# Patient Record
Sex: Male | Born: 1955
Health system: Southern US, Community
[De-identification: ages and names within clinical notes are randomized; demographics above are authoritative.]

## PROBLEM LIST (undated history)

## (undated) DIAGNOSIS — R1310 Dysphagia, unspecified: Secondary | ICD-10-CM

## (undated) DIAGNOSIS — I82409 Acute embolism and thrombosis of unspecified deep veins of unspecified lower extremity: Secondary | ICD-10-CM

## (undated) DIAGNOSIS — F419 Anxiety disorder, unspecified: Secondary | ICD-10-CM

## (undated) DIAGNOSIS — G7000 Myasthenia gravis without (acute) exacerbation: Secondary | ICD-10-CM

## (undated) DIAGNOSIS — E7800 Pure hypercholesterolemia, unspecified: Secondary | ICD-10-CM

## (undated) DIAGNOSIS — I10 Essential (primary) hypertension: Secondary | ICD-10-CM

## (undated) DIAGNOSIS — S42309A Unspecified fracture of shaft of humerus, unspecified arm, initial encounter for closed fracture: Secondary | ICD-10-CM

## (undated) DIAGNOSIS — S2239XA Fracture of one rib, unspecified side, initial encounter for closed fracture: Secondary | ICD-10-CM

## (undated) HISTORY — DX: Unspecified fracture of shaft of humerus, unspecified arm, initial encounter for closed fracture: S42.309A

## (undated) HISTORY — PX: TRACHEOSTOMY: SUR1362

## (undated) HISTORY — DX: Fracture of one rib, unspecified side, initial encounter for closed fracture: S22.39XA

---

## 1999-10-11 ENCOUNTER — Emergency Department (HOSPITAL_COMMUNITY): Admission: EM | Admit: 1999-10-11 | Discharge: 1999-10-11 | Payer: Self-pay | Admitting: Emergency Medicine

## 2000-03-24 ENCOUNTER — Emergency Department (HOSPITAL_COMMUNITY): Admission: EM | Admit: 2000-03-24 | Discharge: 2000-03-24 | Payer: Self-pay | Admitting: Emergency Medicine

## 2001-01-04 ENCOUNTER — Emergency Department (HOSPITAL_COMMUNITY): Admission: EM | Admit: 2001-01-04 | Discharge: 2001-01-05 | Payer: Self-pay | Admitting: Emergency Medicine

## 2006-05-13 ENCOUNTER — Ambulatory Visit: Payer: Self-pay | Admitting: Family Medicine

## 2006-05-13 ENCOUNTER — Inpatient Hospital Stay (HOSPITAL_COMMUNITY): Admission: RE | Admit: 2006-05-13 | Discharge: 2006-05-15 | Payer: Self-pay | Admitting: Family Medicine

## 2006-05-16 ENCOUNTER — Ambulatory Visit: Payer: Self-pay | Admitting: Sports Medicine

## 2006-05-17 ENCOUNTER — Ambulatory Visit: Payer: Self-pay | Admitting: Family Medicine

## 2006-05-20 ENCOUNTER — Ambulatory Visit: Payer: Self-pay | Admitting: Family Medicine

## 2006-05-23 ENCOUNTER — Ambulatory Visit: Payer: Self-pay | Admitting: Sports Medicine

## 2006-05-24 ENCOUNTER — Ambulatory Visit: Payer: Self-pay | Admitting: Sports Medicine

## 2006-05-27 ENCOUNTER — Ambulatory Visit: Payer: Self-pay | Admitting: Family Medicine

## 2006-05-29 ENCOUNTER — Ambulatory Visit: Payer: Self-pay | Admitting: Family Medicine

## 2006-06-07 ENCOUNTER — Ambulatory Visit: Payer: Self-pay | Admitting: Family Medicine

## 2006-06-14 ENCOUNTER — Ambulatory Visit: Payer: Self-pay | Admitting: Family Medicine

## 2006-06-28 ENCOUNTER — Ambulatory Visit: Payer: Self-pay | Admitting: Family Medicine

## 2006-07-08 ENCOUNTER — Ambulatory Visit: Payer: Self-pay | Admitting: Family Medicine

## 2006-07-12 ENCOUNTER — Ambulatory Visit: Payer: Self-pay | Admitting: Family Medicine

## 2006-07-22 ENCOUNTER — Ambulatory Visit: Payer: Self-pay | Admitting: Family Medicine

## 2006-08-06 ENCOUNTER — Ambulatory Visit: Payer: Self-pay | Admitting: Family Medicine

## 2006-08-08 DIAGNOSIS — I10 Essential (primary) hypertension: Secondary | ICD-10-CM | POA: Insufficient documentation

## 2006-08-08 DIAGNOSIS — E7800 Pure hypercholesterolemia, unspecified: Secondary | ICD-10-CM | POA: Insufficient documentation

## 2006-08-19 ENCOUNTER — Ambulatory Visit: Payer: Self-pay | Admitting: Family Medicine

## 2006-09-02 ENCOUNTER — Ambulatory Visit: Payer: Self-pay | Admitting: Family Medicine

## 2006-09-02 LAB — CONVERTED CEMR LAB: INR: 1.9

## 2006-09-16 ENCOUNTER — Ambulatory Visit: Payer: Self-pay | Admitting: Family Medicine

## 2006-09-27 ENCOUNTER — Telehealth: Payer: Self-pay | Admitting: *Deleted

## 2006-09-30 ENCOUNTER — Ambulatory Visit: Payer: Self-pay | Admitting: Family Medicine

## 2006-10-21 ENCOUNTER — Ambulatory Visit: Payer: Self-pay | Admitting: Family Medicine

## 2006-11-18 ENCOUNTER — Ambulatory Visit: Payer: Self-pay | Admitting: Sports Medicine

## 2006-11-18 ENCOUNTER — Encounter (INDEPENDENT_AMBULATORY_CARE_PROVIDER_SITE_OTHER): Payer: Self-pay | Admitting: Family Medicine

## 2006-11-18 LAB — CONVERTED CEMR LAB: INR: 2.6

## 2006-12-10 ENCOUNTER — Telehealth: Payer: Self-pay | Admitting: *Deleted

## 2006-12-16 ENCOUNTER — Ambulatory Visit: Payer: Self-pay | Admitting: Sports Medicine

## 2007-01-13 ENCOUNTER — Ambulatory Visit: Payer: Self-pay | Admitting: Sports Medicine

## 2007-02-11 ENCOUNTER — Ambulatory Visit: Payer: Self-pay | Admitting: Family Medicine

## 2007-02-11 LAB — CONVERTED CEMR LAB: INR: 3

## 2007-02-19 ENCOUNTER — Ambulatory Visit: Payer: Self-pay | Admitting: Family Medicine

## 2007-03-18 ENCOUNTER — Ambulatory Visit: Payer: Self-pay | Admitting: Family Medicine

## 2007-03-18 LAB — CONVERTED CEMR LAB: INR: 2.6

## 2007-04-21 ENCOUNTER — Ambulatory Visit: Payer: Self-pay | Admitting: Family Medicine

## 2007-04-21 DIAGNOSIS — Z8672 Personal history of thrombophlebitis: Secondary | ICD-10-CM | POA: Insufficient documentation

## 2007-04-21 LAB — CONVERTED CEMR LAB: INR: 3.2

## 2007-05-19 ENCOUNTER — Ambulatory Visit: Payer: Self-pay | Admitting: Family Medicine

## 2007-06-16 ENCOUNTER — Ambulatory Visit: Payer: Self-pay | Admitting: *Deleted

## 2007-06-16 LAB — CONVERTED CEMR LAB: INR: 3.3

## 2007-06-30 ENCOUNTER — Ambulatory Visit: Payer: Self-pay | Admitting: Family Medicine

## 2007-06-30 LAB — CONVERTED CEMR LAB: INR: 3.6

## 2007-07-14 ENCOUNTER — Ambulatory Visit: Payer: Self-pay | Admitting: Family Medicine

## 2007-08-08 ENCOUNTER — Ambulatory Visit: Payer: Self-pay | Admitting: Family Medicine

## 2007-09-05 ENCOUNTER — Ambulatory Visit: Payer: Self-pay | Admitting: Family Medicine

## 2007-09-05 LAB — CONVERTED CEMR LAB: INR: 3.4

## 2007-10-03 ENCOUNTER — Ambulatory Visit: Payer: Self-pay | Admitting: Family Medicine

## 2007-10-17 ENCOUNTER — Encounter (INDEPENDENT_AMBULATORY_CARE_PROVIDER_SITE_OTHER): Payer: Self-pay | Admitting: Family Medicine

## 2007-11-06 ENCOUNTER — Encounter (INDEPENDENT_AMBULATORY_CARE_PROVIDER_SITE_OTHER): Payer: Self-pay | Admitting: Family Medicine

## 2007-11-06 ENCOUNTER — Ambulatory Visit: Payer: Self-pay | Admitting: Family Medicine

## 2007-11-06 LAB — CONVERTED CEMR LAB
ALT: 24 units/L (ref 0–53)
AST: 20 units/L (ref 0–37)
Albumin: 4.6 g/dL (ref 3.5–5.2)
BUN: 15 mg/dL (ref 6–23)
CO2: 24 meq/L (ref 19–32)
Calcium: 9.4 mg/dL (ref 8.4–10.5)
Chloride: 102 meq/L (ref 96–112)
Cholesterol: 222 mg/dL — ABNORMAL HIGH (ref 0–200)
HDL: 35 mg/dL — ABNORMAL LOW (ref >39)
Potassium: 4.3 meq/L (ref 3.5–5.3)

## 2007-11-13 ENCOUNTER — Encounter (INDEPENDENT_AMBULATORY_CARE_PROVIDER_SITE_OTHER): Payer: Self-pay | Admitting: Family Medicine

## 2007-12-03 ENCOUNTER — Encounter (INDEPENDENT_AMBULATORY_CARE_PROVIDER_SITE_OTHER): Payer: Self-pay | Admitting: *Deleted

## 2008-03-15 ENCOUNTER — Encounter (INDEPENDENT_AMBULATORY_CARE_PROVIDER_SITE_OTHER): Payer: Self-pay | Admitting: Family Medicine

## 2009-01-10 ENCOUNTER — Encounter: Payer: Self-pay | Admitting: Family Medicine

## 2009-03-31 ENCOUNTER — Encounter: Payer: Self-pay | Admitting: Family Medicine

## 2009-05-30 ENCOUNTER — Telehealth: Payer: Self-pay | Admitting: Family Medicine

## 2009-06-27 ENCOUNTER — Ambulatory Visit: Payer: Self-pay | Admitting: Family Medicine

## 2009-06-27 ENCOUNTER — Encounter: Payer: Self-pay | Admitting: Family Medicine

## 2009-06-28 LAB — CONVERTED CEMR LAB
Albumin: 4.7 g/dL (ref 3.5–5.2)
CO2: 25 meq/L (ref 19–32)
Cholesterol: 223 mg/dL — ABNORMAL HIGH (ref 0–200)
Glucose, Bld: 94 mg/dL (ref 70–99)
Potassium: 4 meq/L (ref 3.5–5.3)
Sodium: 134 meq/L — ABNORMAL LOW (ref 135–145)
Total Protein: 7.3 g/dL (ref 6.0–8.3)
Triglycerides: 164 mg/dL — ABNORMAL HIGH (ref <150)

## 2009-07-28 ENCOUNTER — Ambulatory Visit: Payer: Self-pay | Admitting: Family Medicine

## 2009-07-29 ENCOUNTER — Encounter: Payer: Self-pay | Admitting: Family Medicine

## 2009-08-11 ENCOUNTER — Telehealth: Payer: Self-pay | Admitting: Family Medicine

## 2009-08-26 ENCOUNTER — Telehealth: Payer: Self-pay | Admitting: Family Medicine

## 2009-11-24 ENCOUNTER — Ambulatory Visit: Payer: Self-pay | Admitting: Family Medicine

## 2009-11-24 ENCOUNTER — Encounter: Payer: Self-pay | Admitting: Family Medicine

## 2009-11-24 LAB — CONVERTED CEMR LAB
BUN: 23 mg/dL (ref 6–23)
CO2: 26 meq/L (ref 19–32)
Calcium: 9.2 mg/dL (ref 8.4–10.5)
Chloride: 99 meq/L (ref 96–112)
Creatinine, Ser: 1.04 mg/dL (ref 0.40–1.50)
Glucose, Bld: 90 mg/dL (ref 70–99)

## 2009-11-28 ENCOUNTER — Encounter: Payer: Self-pay | Admitting: Family Medicine

## 2010-07-11 NOTE — Letter (Signed)
Summary: Generic Letter  [facility]  [address]   [place], [place] [address]   Phone: [phone]  Fax: [phone]    07/29/2009  AMIRE LEAZER [address] [place], [place]  [address]  Dear Mr. Sarin,  Please call me at the office when you have a few minutes to discuss your blood pressure and cholesterol.  I tried to call you today, but was unable to get through at your home number.  I look forward to talking with you.        Sincerely,   Asher Muir MD  Appended Document: Generic Letter mailed.

## 2010-07-11 NOTE — Progress Notes (Signed)
Summary: refill & message  Phone Note Refill Request Call back at Home Phone [phone] Message from:  Patient  Refills Requested: Medication #1:  LISINOPRIL-HYDROCHLOROTHIAZIDE 20-25 MG TABS Take 1 tablet by mouth once a day pt would like to talk to doctor  Initial call taken by: De Nurse,  August 11, 2009 3:51 PM Caller: Patient Initial call taken by: De Nurse,  August 11, 2009 3:50 PM  Follow-up for Phone Call        left mssg for pt to call back.  refilled bp med for one month, but would like to discuss making changes in the dose. Follow-up by: Asher Muir MD,  August 12, 2009 12:14 PM  Additional Follow-up for Phone Call Additional follow up Details #1::        left another message for pt to call back to discuss bp meds and cholesterol Additional Follow-up by: Asher Muir MD,  August 19, 2009 12:12 PM    Prescriptions: LISINOPRIL-HYDROCHLOROTHIAZIDE 20-25 MG TABS (LISINOPRIL-HYDROCHLOROTHIAZIDE) Take 1 tablet by mouth once a day  #34 x 0   Entered and Authorized by:   Asher Muir MD   Signed by:   Asher Muir MD on 08/12/2009   Method used:   Electronically to        [facility] [address] (retail)       [phone]       [place], [place]  [address]       Ph: [phone]       Fax: [phone]   RxID:   [id number]

## 2010-07-11 NOTE — Progress Notes (Signed)
  Phone Note Call from Patient   Caller: Patient Summary of Call: pt returned call.  discussed lab results.  ldl above goal; would like to start simva 40.  Also, would like to increase lisinopril hctz.  will call [facility] [address].  pt to come back for labs (CMET) and bp check in 3 months Initial call taken by: Asher Muir MD,  August 26, 2009 12:10 PM    New/Updated Medications: LISINOPRIL-HYDROCHLOROTHIAZIDE 20-12.5 MG TABS (LISINOPRIL-HYDROCHLOROTHIAZIDE) 2 tabs by mouth daily for blood pressure SIMVASTATIN 40 MG TABS (SIMVASTATIN) 1 tab by mouth daily for cholesterol Prescriptions: SIMVASTATIN 40 MG TABS (SIMVASTATIN) 1 tab by mouth daily for cholesterol  #90 x 3   Entered and Authorized by:   Asher Muir MD   Signed by:   Asher Muir MD on 08/26/2009   Method used:   Electronically to        [facility] [address] (retail)       [phone]       [place], [place]  [address]       Ph: [phone]       Fax: [phone]   RxID:   [id number] LISINOPRIL-HYDROCHLOROTHIAZIDE 20-12.5 MG TABS (LISINOPRIL-HYDROCHLOROTHIAZIDE) 2 tabs by mouth daily for blood pressure  #180 x 3   Entered and Authorized by:   Asher Muir MD   Signed by:   Asher Muir MD on 08/26/2009   Method used:   Electronically to        [facility] [address] (retail)       [phone]       [place], [place]  [address]       Ph: [phone]       Fax: [phone]   RxID:   [id number]

## 2010-07-11 NOTE — Assessment & Plan Note (Signed)
Summary: bp check/kh  Nurse Visit BP checked manually after patient rested 8 minutes. BP LA 130/86 , RA  136/88  pulse 92. taking meds as directed. will forward to Dr. Lafonda Mosses and call patient with her comments. [phone]. Theresia Lo RN  July 28, 2009 3:48 PM  Attempted to call pt at number above and received mssg stating call could not be completed as dialed.  Will send letter. Allergies: No Known Drug Allergies  Orders Added: 1)  No Charge Patient Arrived (NCPA0) [NCPA0]

## 2010-07-11 NOTE — Assessment & Plan Note (Signed)
Summary: re-establish,df   Vital Signs:  Patient profile:   55 year old male Height:      74 inches Weight:      242.7 pounds BMI:     31.27 Temp:     97.5 degrees F oral Pulse rate:   73 / minute BP sitting:   140 / 94  (right arm) Cuff size:   large  Vitals Entered By: Gladstone Pih (June 27, 2009 4:27 PM) CC: htn, hld, stress, hx dvt Is Patient Diabetic? No Pain Assessment Patient in pain? no        CC:  htn, hld, stress, and hx dvt.  History of Present Illness: Here for f/u.  has not been here in > year.  discussed:  1.  htn--slightly over goal today even on manual recheck.  on lisinopril-hctz 20-25.  does not check bps at home  2.  hld--not on meds.  ldl goal would be 130.    3.  personal stress--see social hx  4.  dvt--in 2007.  no smoking, but did travel prior to the dvt.  off coumadin now.  takes asprin  Habits & Providers  Alcohol-Tobacco-Diet     Tobacco Status: never  Current Medications (verified): 1)  Lisinopril-Hydrochlorothiazide 20-25 Mg Tabs (Lisinopril-Hydrochlorothiazide) .... Take 1 Tablet By Mouth Once A Day  Allergies: No Known Drug Allergies  Past History:  Past Medical History: no h/o blood clot prior to R LE DVT Dec 2007 HTN hld  Family History: Reviewed history from 08/08/2006 and no changes required. no family h/o blood clots, miscarriages, sudden death, or clotting disorders  Social History: Reviewed history from 11/06/2007 and no changes required. Married to [place]. Employed at [employer]  as Production designer, theatre/television/film. Exercises approx 3-5 times per week. Never smoked.  occasional EtOH, no drugs.  lots of personal stress right now.  wife has cheated on him and is gambling.  for financial reasons he is needing to stay in the marriage for the time being.  has not yet gone to counseling  Physical Exam  General:  Well-developed,well-nourished,in no acute distress; alert,appropriate and cooperative throughout examination Lungs:  Normal  respiratory effort, chest expands symmetrically. Lungs are clear to auscultation, no crackles or wheezes. Heart:  Normal rate and regular rhythm. S1 and S2 normal without gallop, murmur, click, rub or other extra sounds. Additional Exam:  vital signs reviewed    Impression & Recommendations:  Problem # 1:  HYPERTENSION, BENIGN SYSTEMIC (ICD-401.1) Assessment Deteriorated slightly above goal.  decided he will return for recheck at nurse visit and we will increase if still above goal.  check bmet His updated medication list for this problem includes:    Lisinopril-hydrochlorothiazide 20-25 Mg Tabs (Lisinopril-hydrochlorothiazide) .Marland Kitchen... Take 1 tablet by mouth once a day  Orders: Comp Met-[facility] ([id number]) [facility]- Est  Level 4 (81856)  Problem # 2:  HYPERCHOLESTEROLEMIA (ICD-272.0) Assessment: Unchanged check flp today.  goal is 130 Orders: Lipid-[facility] (31497-02637) [facility]- Est  Level 4 ([address]885)  Problem # 3:  DEEP VENOUS THROMBOPHLEBITIS, HX OF (ICD-V12.52) Assessment: Improved  no signs of recurrence His updated medication list for this problem includes:    Aspirin 81 Mg Chew (Aspirin) .Marland Kitchen... 1 tab by mouth daily  His updated medication list for this problem includes:    Aspirin 81 Mg Chew (Aspirin) .Marland Kitchen... 1 tab by mouth daily  Orders: [facility]- Est  Level 4 (02774)  Problem # 4:  prevention will set up colonoscopy he will think about psa and prostate exam  Complete Medication  List: 1)  Lisinopril-hydrochlorothiazide 20-25 Mg Tabs (Lisinopril-hydrochlorothiazide) .... Take 1 tablet by mouth once a day 2)  Folic Acid 400 Mcg Tabs (Folic acid) .Marland Kitchen.. 1 tab by mouth daily 3)  Aspirin 81 Mg Chew (Aspirin) .Marland Kitchen.. 1 tab by mouth daily  Other Orders: Colonoscopy (Colon)  Patient Instructions: 1)  It was nice to see you today. 2)  I will call you with your lab results. 3)  For your blood pressure, make a nurse visit appointment to check blood pressure. 4)  Then I will call you and we will talk  about what to do with your medicines. 5)  We will help you set up a colonoscopy. 6)  Please schedule a follow-up appointment in 6 months  for blood pressure.  Prescriptions: LISINOPRIL-HYDROCHLOROTHIAZIDE 20-25 MG TABS (LISINOPRIL-HYDROCHLOROTHIAZIDE) Take 1 tablet by mouth once a day  #34 x 0   Entered and Authorized by:   Asher Muir MD   Signed by:   Asher Muir MD on 06/27/2009   Method used:   Electronically to        [facility] [address] (retail)       [phone]       [place], [place]  [address]       Ph: [phone]       Fax: [phone]   RxID:   [id number]   Prevention & Chronic Care Immunizations   Influenza vaccine: Not documented    Tetanus booster: Not documented    Pneumococcal vaccine: Not documented  Colorectal Screening   Hemoccult: Not documented   Hemoccult due: Not Indicated    Colonoscopy: Not documented   Colonoscopy action/deferral: GI Referral  (06/27/2009)  Other Screening   PSA: Not documented   Smoking status: never  (06/27/2009)  Lipids   Total Cholesterol: 222  (11/06/2007)   LDL: 152  (11/06/2007)   LDL Direct: Not documented   HDL: 35  (11/06/2007)   Triglycerides: 177  (11/06/2007)    SGOT (AST): 20  (11/06/2007)   SGPT (ALT): 24  (11/06/2007) CMP ordered    Alkaline phosphatase: 45  (11/06/2007)   Total bilirubin: 1.1  (11/06/2007)    Lipid flowsheet reviewed?: Yes   Progress toward LDL goal: Unchanged  Hypertension   Last Blood Pressure: 140 / 94  (06/27/2009)   Serum creatinine: 0.90  (11/06/2007)   Serum potassium 4.3  (11/06/2007) CMP ordered     Hypertension flowsheet reviewed?: Yes   Progress toward BP goal: Unchanged  Self-Management Support :    Hypertension self-management support: Not documented    Lipid self-management support: Not documented    Nursing Instructions: Screening colonoscopy ordered

## 2010-07-11 NOTE — Letter (Signed)
Summary: Generic Letter  [facility]  [address]   [place], [place] [address]   Phone: [phone]  Fax: [phone]    11/28/2009  WILLIM TURNAGE [address] [place], [place]  [address]  Dear Mr. Pupo,   I just wanted to let you know that your lab results were normal.  Please call me if you have any questions or concerns.           Sincerely,   Asher Muir MD  Appended Document: Generic Letter mailed

## 2010-08-22 ENCOUNTER — Other Ambulatory Visit: Payer: Self-pay | Admitting: Family Medicine

## 2010-08-22 MED ORDER — SIMVASTATIN 40 MG PO TABS: 40.0000 mg | ORAL_TABLET | Freq: Every day | ORAL | Status: DC

## 2010-08-22 MED ORDER — LISINOPRIL-HYDROCHLOROTHIAZIDE 20-12.5 MG PO TABS: 2.0000 | ORAL_TABLET | Freq: Every day | ORAL | Status: DC

## 2010-10-27 NOTE — Discharge Summary (Signed)
Victor Durham, OVERHOLSER NO.:  [id number]   MEDICAL RECORD NO.:  [id number]          PATIENT TYPE:  INP   LOCATION:  5708                         FACILITY:  [facility]   PHYSICIAN:  Lupita Raider, M.D.   DATE OF BIRTH:  05-26-1956   DATE OF ADMISSION:  05/13/2006  DATE OF DISCHARGE:  05/15/2006                               DISCHARGE SUMMARY   PRIMARY CARE PHYSICIAN:  None.   CONSULTANTS:  None.   PROCEDURES:  None.   REASON FOR ADMISSION:  See dictated H&P for full details.  Briefly, this  is a 55 year old white male with new-onset right leg DVT for initiation  of anticoagulation therapy.   DISCHARGE DIAGNOSES:  1. Right lower extremity deep venous thrombosis.  2. Hypertension.  3. Hyperlipidemia.   DISCHARGE MEDICATIONS:  1. HCTZ 12.5 mg p.o. daily.  2. Lovenox 100 mg subcu b.i.d.  3. Coumadin 5 mg given prior to discharge.  The patient to receive      dosing per INR tomorrow.   DISPOSITION:  The patient is discharged home with 2 doses of Lovenox  given to the patient and his wife as we were having difficulty getting  approval from his insurance company.  The patient and his wife are both  reliable and will follow up with procuring remainder of the doses either  at my office or with the insurance company.   DISCHARGE FOLLOWUP:  1. INR check on May 16, 2006, at the [facility] at 8:30 a.m.  2. Dr. Cam Hai at the [facility] on      May 23, 2006, at 3 o'clock to establish a primary care      physician.   FOLLOWUP ISSUES:  1. Anticoagulation - the patient will need at least 6 months of      anticoagulation therapy along with the colonoscopy when possible.  2. Hypertension.  We will likely need a second agent.  We will need a      BMP at the followup visit with Dr. Clelia Croft next week to check his      electrolytes.  3. Hyperlipidemia.  The patient will like to meet with a nutritionist  prior to instituting a Statin.   LABORATORY:  INR after one 10 mg dose of Coumadin went from 1.1 to 1.6.  Hemoccult negative.  Creatinine 1.0, hemoglobin 17.0.  PSA 0.87.  Chest  x-ray with no active disease.  Total cholesterol at 240 and  triglycerides 132, HDL 38, LDL 176.   HOSPITAL COURSE BY PROBLEM:  1. DVT.  The patient is admitted from the emergency department after      diagnosis of a DVT on the right lower extremity by Dopplers.  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      Immobilization DVT is probable but a PSA and chest x-ray were both      done to look malignancy.  Colonoscopy could not be done secondary      to already on anticoagulation therapy.  The patient denied any      signs or symptoms of malignancy including weight loss, bowel      changes, or cough.  Hypercoagulable panel was not needed as he has      absolutely no family history, and this was his first clot at the      age of 57.  He was bridged to Lovenox while instituting Coumadin,      and INR went from 1.1 to 1.6 after 1 dose of 10 mg of Coumadin.  We      had some difficulty obtaining coverage by the insurance company for      the Lovenox, and we are still working on this at the time of      discharge.  The patient is given 2 doses of Lovenox to take home      and was also given today's Coumadin dose of 5 mg prior to      discharge.  He has to follow up in the morning at the family      practice center for an INR check, at which time, he will need a      Coumadin prescription depending on the dose.  He will also need to      obtain his Lovenox doses from the family practice center if his      insurance company denies the claim for mail order Lovenox.  The      patient will need to continue anticoagulation therapy for at least      6 months.  2. Hypertension.  The patient has a diagnosis of hypertension  but has      never been on any medication and was recently given a prescription      at urgent care for Norvasc for which he never filled.  His blood      pressures, this admission, were 140s/70s.  He was started on HCTZ      12.5 mg p.o. daily.  He will very likely need to be titrated up and      possibly placed on a second agent.  The patient and his wife are      both interested in [place], but we will discuss this after he      follows up with our nutritionist as below.  3. Hyperlipidemia.  Fasting lipid panel drawn on this admission will      not likely be able to be modified much by lifestyle changes given      his current diet and LDL of 170.  The patient would like to wait      and meet with our nutritionist at the [facility] as he is very motivated to change his diet and would like to      avoid taking medications if possible.  I addressed this with him      and expressed to him that I did not think lifestyle modifications      would alter his LDL to goal, but we will wait initiating statin      until after he meets with our nutritionist, Lyla Glassing.           ______________________________  Lupita Raider, M.D.     KS/MEDQ  D:  05/15/2006  T:  05/16/2006  Job:  [id number]

## 2010-10-27 NOTE — H&P (Signed)
NAMEDERRYL, UHER NO.:  [id number]   MEDICAL RECORD NO.:  [id number]          PATIENT TYPE:  INP   LOCATION:  5708                         FACILITY:  [facility]   PHYSICIAN:  Dwana Curd. Para March, M.D. DATE OF BIRTH:  01/03/1956   DATE OF ADMISSION:  05/13/2006  DATE OF DISCHARGE:                              HISTORY & PHYSICAL   PRIMARY CARE PHYSICIAN:  Dr. Cleta Alberts.   CHIEF COMPLAINT:  Right calf pain.   HISTORY OF PRESENT ILLNESS:  The patient is a 55 year old male with  hypertension who presents from urgent care with a 6-day history of right  calf pain.  He noticed tightness, increased swelling and warmth of his  right leg and pain was noted to be approximately 3 out of 10.  He  describes the pain as throbbing.  Says that it comes and goes and lasts  4 minutes at a time.  He has had mild improvement with Advil and heat  and has taken Bayer aspirin as well.  The pain is worse with any touch  as well as walking.  The patient walks with a limp.  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  The patient denies fever, shortness of breath, chest pain, or  palpitations.  Scan at [facility] showed evidence of DVT in the  right leg.   PAST MEDICAL HISTORY:  Hypertension.  The patient was prescribed a blood  pressure medicine but did not fill the prescription.  History of  bronchitis.   PAST SURGICAL HISTORY:  None reported.   ALLERGIES:  NO KNOWN DRUG ALLERGIES.   MEDICATIONS:  Occasional caffeine drinks, Excedrin, and multivitamin.   SOCIAL HISTORY:  The patient works for [employer]  as a Production designer, theatre/television/film.  He tends to work throughout the day and through his lunch break.  Increased stress at work times about 5 years.  Denies tobacco use.  Rare  ethanol use, approximately 1 drink per month.  No history of intravenous  drug use.  Married 6 years.  Has dogs at home.  The patient exercises  almost every other  day, both cardiovascular and weightlifting.   FAMILY HISTORY:  Father with diabetes mellitus type 2 and myasthenia  gravis.   REVIEW OF SYSTEMS:  Positive for headache, which is tension type and  occurs about 1 time per week, relieved with Excedrin or Tylenol.  Recent  1 to 2 month decreased vision in his left eye, which is requiring  glasses only.   PHYSICAL EXAMINATION:  VITALS:  Temperature 97.1, pulse 77, respiratory  rate 20, sating at 98% on room air, blood pressure 145/78.  GENERAL:  The patient is well-appearing in no apparent distress and is  awake, alert, oriented x3.  HEENT:  Moist mucous membranes.  Normocephalic.  Pupils equally round  and reactive to light.  Extraocular movements are intact.  No bleeding  noted in the nares.  CARDIOVASCULAR:  Normal rate.  Regular rhythm.  No murmurs, rubs, or  gallops.  Pulses are 2+ distally.  LUNGS:  Clear to auscultation bilaterally and no increased work of  breathing.  ABDOMINAL EXAM:  Soft, nontender, nondistended.  Positive bowel sounds.  No organomegaly noted.  NEURO:  Cranial nerves II through XII are intact.  Strength 5/5  bilateral upper extremity.  Right lower extremity is 4/5 secondary to  pain.  EXTREMITIES:  Right calf and leg are warm and swollen to the level of  the upper thigh.  No erythema was noted.   ASSESSMENT AND PLAN:  A 55 year old male with hypertension who presents  with deep venous thrombosis.  1. Deep venous thrombosis.  The patient is started on Lovenox 1 mg/kg      subcu with bridging to Coumadin.  2. Hemoccult of his stool was negative with a complete blood count,      INR, and a complete metabolic panel pending.  In the morning we      plan to draw an a.m. fasting lipid profile.  3. Hypertension.  We plan to follow his blood pressure and consider an      ACE inhibitor if his blood pressure increases.  Also will check his      creatinine and kidney function prior to starting his ACE inhibitor.  4.  Most likely plan for followup as an outpatient for starting a blood      pressure medicine if necessary and would recommend that the patient      continue a healthy diet and continue exercising.  5. Prophylaxis.  The patient is to ambulate as tolerated.      Dwana Curd Para March, M.D.     GSD/MEDQ  D:  05/13/2006  T:  05/14/2006  Job:  [id number]

## 2012-02-06 ENCOUNTER — Encounter (HOSPITAL_COMMUNITY): Payer: Self-pay | Admitting: Family Medicine

## 2012-02-06 ENCOUNTER — Emergency Department (HOSPITAL_COMMUNITY)
Admission: EM | Admit: 2012-02-06 | Discharge: 2012-02-06 | Disposition: A | Discharge status: Home / Self Care | Payer: Self-pay | Attending: Emergency Medicine | Admitting: Emergency Medicine

## 2012-02-06 DIAGNOSIS — I10 Essential (primary) hypertension: Secondary | ICD-10-CM | POA: Insufficient documentation

## 2012-02-06 DIAGNOSIS — F3289 Other specified depressive episodes: Secondary | ICD-10-CM | POA: Insufficient documentation

## 2012-02-06 DIAGNOSIS — F411 Generalized anxiety disorder: Secondary | ICD-10-CM | POA: Insufficient documentation

## 2012-02-06 DIAGNOSIS — F329 Major depressive disorder, single episode, unspecified: Secondary | ICD-10-CM | POA: Insufficient documentation

## 2012-02-06 HISTORY — DX: Essential (primary) hypertension: I10

## 2012-02-06 HISTORY — DX: Acute embolism and thrombosis of unspecified deep veins of unspecified lower extremity: I82.409

## 2012-02-06 HISTORY — DX: Pure hypercholesterolemia, unspecified: E78.00

## 2012-02-06 LAB — COMPREHENSIVE METABOLIC PANEL WITH GFR
ALT: 18 U/L (ref 0–53)
AST: 18 U/L (ref 0–37)
Albumin: 4.2 g/dL (ref 3.5–5.2)
Alkaline Phosphatase: 53 U/L (ref 39–117)
BUN: 19 mg/dL (ref 6–23)
CO2: 24 meq/L (ref 19–32)
Calcium: 9.7 mg/dL (ref 8.4–10.5)
Chloride: 105 meq/L (ref 96–112)
Creatinine, Ser: 0.92 mg/dL (ref 0.50–1.35)
GFR calc Af Amer: >90 mL/min (ref >90)
GFR calc non Af Amer: >90 mL/min (ref >90)
Glucose, Bld: 123 mg/dL — ABNORMAL HIGH (ref 70–99)
Potassium: 4 meq/L (ref 3.5–5.1)
Sodium: 139 meq/L (ref 135–145)
Total Bilirubin: 1.1 mg/dL (ref 0.3–1.2)
Total Protein: 7.4 g/dL (ref 6.0–8.3)

## 2012-02-06 LAB — RAPID URINE DRUG SCREEN, HOSP PERFORMED
Amphetamines: NOT DETECTED
Barbiturates: NOT DETECTED
Benzodiazepines: NOT DETECTED
Tetrahydrocannabinol: NOT DETECTED

## 2012-02-06 LAB — CBC
HCT: 47 % (ref 39.0–52.0)
Hemoglobin: 16.6 g/dL (ref 13.0–17.0)
MCH: 31.4 pg (ref 26.0–34.0)
MCHC: 35.3 g/dL (ref 30.0–36.0)
MCV: 89 fL (ref 78.0–100.0)
Platelets: 198 K/uL (ref 150–400)
RBC: 5.28 MIL/uL (ref 4.22–5.81)
RDW: 13.3 % (ref 11.5–15.5)
WBC: 8.3 K/uL (ref 4.0–10.5)

## 2012-02-06 LAB — ETHANOL: Alcohol, Ethyl (B): <11 mg/dL (ref 0–11)

## 2012-02-06 MED ORDER — LORAZEPAM 1 MG PO TABS: 1.0000 mg | ORAL_TABLET | Freq: Three times a day (TID) | ORAL | Status: DC | PRN

## 2012-02-06 MED ORDER — IBUPROFEN 600 MG PO TABS: 600.0000 mg | ORAL_TABLET | Freq: Three times a day (TID) | ORAL | Status: DC | PRN

## 2012-02-06 MED ORDER — ONDANSETRON HCL 4 MG PO TABS: 4.0000 mg | ORAL_TABLET | Freq: Three times a day (TID) | ORAL | Status: DC | PRN

## 2012-02-06 MED ORDER — ACETAMINOPHEN 325 MG PO TABS: 650.0000 mg | ORAL_TABLET | ORAL | Status: DC | PRN

## 2012-02-06 MED ORDER — ZOLPIDEM TARTRATE 5 MG PO TABS: 5.0000 mg | ORAL_TABLET | Freq: Every evening | ORAL | Status: DC | PRN

## 2012-02-06 MED ORDER — FOLIC ACID 400 MCG PO TABS: 400.0000 ug | ORAL_TABLET | Freq: Every day | ORAL | Status: DC

## 2012-02-06 MED ORDER — FOLIC ACID 1 MG PO TABS
1.0000 mg | ORAL_TABLET | Freq: Every day | ORAL | Status: DC
  Administered 2012-02-06: 1 mg via ORAL
  Filled 2012-02-06: qty 1

## 2012-02-06 MED ORDER — ALUM & MAG HYDROXIDE-SIMETH 200-200-20 MG/5ML PO SUSP: 30.0000 mL | ORAL | Status: DC | PRN

## 2012-02-06 NOTE — BH Assessment (Signed)
Assessment Note   Victor Durham is an 56 y.o. male. Patient brought to the ED by his ex-spouse for a assessment. Sts that he is here to b/c she wanted him to get help his depression. Patient reportedly told his ex spouse that the next time she see's him it will be in the morgue. Pt states "I know I've made some comments but I don't want to hurt myself, a lot of problems with my parents, I was caught up in their divorce, I am the oldest of 4, everything just built up Saturday, lived in a 1 bedroom apartment, ran out of money, I have 2 settlements coming, one from my wife for alienation and 1 from my work for [employer], settlements in the bank for retirement, called the Social worker office but they won't tell the truth or give me my money, my father & pastor came Saturday to talk, they wanted me to go downtown , i had no transportation, they offered to take me, I just broke down crying and said life's not worth living, and last I got evicted today. Patient explains that "this was his last straw" and "everything just built up on him today. He reports feeling overwhelmed as if he was going to have a panic attack. Patient sts, "I'm just here to talk to someone about my problems that's all". Patient reports having no one to talk to and feels lonely. Patient denied SI throughout the assessment. He sts, "I have a lot to live for". He is able to contract for safety and has a lot of family support. No HI and or AVH's. Patient agrees to follow up with a therapist in the community to discuss the above issues he reports today.   Patient discharged home with the appropriate referrals for out-patient therapy.  Axis I: Depressive Disorder NOS Axis II: Deferred Axis III:  Past Medical History  Diagnosis Date  . DVT (deep venous thrombosis)   . Hypertension   . High cholesterol    Axis IV: economic problems, housing problems, other psychosocial or environmental problems, problems related to social environment and  problems with access to health care services Axis V: 51-60 moderate symptoms  Past Medical History:  Past Medical History  Diagnosis Date  . DVT (deep venous thrombosis)   . Hypertension   . High cholesterol     History reviewed. No pertinent past surgical history.  Family History: History reviewed. No pertinent family history.  Social History:  reports that he has never smoked. He does not have any smokeless tobacco history on file. He reports that he does not drink alcohol or use illicit drugs.  Additional Social History:  Alcohol / Drug Use Pain Medications: See listed MAR Prescriptions: See listed MAR  Over the Counter: See listed MAR History of alcohol / drug use?: No history of alcohol / drug abuse  CIWA: CIWA-Ar BP: 166/98 mmHg Pulse Rate: 88  COWS:    Allergies: No Known Allergies  Home Medications:  (Not in a hospital admission)  OB/GYN Status:  No LMP for male patient.  General Assessment Data Location of Assessment: [facility] ACT Assessment: Yes Living Arrangements: Other (Comment);Parent;Alone (evicted from apt today; will stay with father or other famil) Can pt return to current living arrangement?: No (evicted from apt today will stay with family) Admission Status: Voluntary Is patient capable of signing voluntary admission?: Yes Transfer from: Acute Hospital Referral Source: Self/Family/Friend (ex spouse)  Education Status Is patient currently in school?: No  Risk to  self Suicidal Ideation: No Suicidal Intent: No Is patient at risk for suicide?: No Suicidal Plan?: No Access to Means: No What has been your use of drugs/alcohol within the last 12 months?:  (pt denies) Previous Attempts/Gestures: No How many times?:  (0) Other Self Harm Risks:  (none reported) Triggers for Past Attempts: Other (Comment) (no previous attempts or gestures) Intentional Self Injurious Behavior: None Family Suicide History: No Recent stressful life event(s): Loss  (Comment);Divorce;Conflict (Comment);Job Loss;Legal Issues;Financial Problems;Turmoil (Comment) (divorce,sued spouse's mistress,waiting on $ from 2 lawsuit,) Persecutory voices/beliefs?: No Depression: Yes Depression Symptoms: Feeling angry/irritable;Feeling worthless/self pity;Loss of interest in usual pleasures;Fatigue;Isolating;Guilt;Tearfulness Substance abuse history and/or treatment for substance abuse?: No Suicide prevention information given to non-admitted patients: Yes  Risk to Others Homicidal Ideation: No Thoughts of Harm to Others: No Current Homicidal Intent: No Current Homicidal Plan: No Access to Homicidal Means: No Identified Victim:  (none reported) History of harm to others?: No Assessment of Violence: None Noted Violent Behavior Description:  (patient polite, calm, and cooperative during the assessment) Does patient have access to weapons?: No Criminal Charges Pending?: No Does patient have a court date: No  Psychosis Hallucinations: None noted Delusions: None noted  Mental Status Report Appear/Hygiene: Other (Comment) (appropriate) Eye Contact: Fair Motor Activity: Freedom of movement Speech: Logical/coherent Level of Consciousness: Alert Mood: Depressed;Sad Affect: Appropriate to circumstance Anxiety Level: Severe Thought Processes: Coherent Judgement: Unimpaired Orientation: Person;Place;Situation;Time Obsessive Compulsive Thoughts/Behaviors: None  Cognitive Functioning Concentration: Normal Memory: Recent Intact;Remote Intact IQ: Average Insight: Good Impulse Control: Good Appetite: Fair Weight Loss:  (pt sts, "I think I loss a few pounds not sure") Weight Gain:  (none reported) Sleep: No Change Vegetative Symptoms: None  ADLScreening [facility] Assessment Services) Patient's cognitive ability adequate to safely complete daily activities?: Yes Patient able to express need for assistance with ADLs?: Yes Independently performs ADLs?: Yes  (appropriate for developmental age)  Abuse/Neglect [facility]) Physical Abuse: Denies Verbal Abuse: Denies Sexual Abuse: Denies  Prior Inpatient Therapy Prior Inpatient Therapy: No Prior Therapy Dates:  (n/a) Prior Therapy Facilty/Provider(s):  (n/a) Reason for Treatment:  (n/a)  Prior Outpatient Therapy Prior Outpatient Therapy: No Prior Therapy Dates:  (n/a) Prior Therapy Facilty/Provider(s):  (n/a) Reason for Treatment:  (n/a)  ADL Screening (condition at time of admission) Patient's cognitive ability adequate to safely complete daily activities?: Yes Patient able to express need for assistance with ADLs?: Yes Independently performs ADLs?: Yes (appropriate for developmental age) Weakness of Legs: None Weakness of Arms/Hands: None  Home Assistive Devices/Equipment Home Assistive Devices/Equipment: None    Abuse/Neglect Assessment (Assessment to be complete while patient is alone) Physical Abuse: Denies Verbal Abuse: Denies Sexual Abuse: Denies Exploitation of patient/patient's resources: Denies Self-Neglect: Denies Values / Beliefs Cultural Requests During Hospitalization: None Spiritual Requests During Hospitalization: None   Advance Directives (For Healthcare) Advance Directive: Patient does not have advance directive Nutrition Screen- [facility] Adult/[facility] Patient's home diet: Regular  Additional Information 1:1 In Past 12 Months?: No CIRT Risk: No Elopement Risk: No Does patient have medical clearance?: Yes     Disposition:  Disposition Disposition of Patient: Outpatient treatment;Referred to ([facility], [facility], [facility], other community provid) Type of outpatient treatment: Adult Patient referred to: [facility];Other (Comment) ([facility], [facility], [facility], etc.)  On Site Evaluation by:   Reviewed with Physician:     Octaviano Batty 02/06/2012 8:42 PM

## 2012-02-06 NOTE — ED Provider Notes (Signed)
ACT team examined the patient and felt that patient is not suicidal currently. He is having financial trouble and will see a psychiatrist outpatient. He knows to return if he has suicidal or homicidal ideations.   Victor Canal, MD 02/06/12 2022

## 2012-02-06 NOTE — ED Provider Notes (Signed)
History     CSN: [id number]  Arrival date & time 02/06/12  1646   First MD Initiated Contact with Patient 02/06/12 1708      Chief Complaint  Patient presents with  . Medical Clearance    (Consider location/radiation/quality/duration/timing/severity/associated sxs/prior treatment) HPI Comments: Patient brought in today by his ex wife due to depression.  She reports that he had made comments to her that the next time that she would see him would be in a morgue.  Patient admits to feeling depressed and anxious recently.  He has a recent separation from his wife, his house was foreclosed on, and today he found out that he had been evicted from his apartment.  He also states that he has been feeling like he is going to have a panic attack.  He has been having intermittent thoughts throughout the week that he would be better off dead.  No suicidal plan.  No homicidal plan.  He denies any alcohol use.  Denies recreational drug use.  No prior psychiatric history.  The history is provided by the patient.    Past Medical History  Diagnosis Date  . DVT (deep venous thrombosis)   . Hypertension   . High cholesterol     History reviewed. No pertinent past surgical history.  History reviewed. No pertinent family history.  History  Substance Use Topics  . Smoking status: Never Smoker   . Smokeless tobacco: Not on file  . Alcohol Use: No      Review of Systems  Allergies  Review of patient's allergies indicates no known allergies.  Home Medications   Current Outpatient Rx  Name Route Sig Dispense Refill  . ASPIRIN 81 MG PO CHEW Oral Chew 81 mg by mouth daily.      Marland Kitchen FOLIC ACID 400 MCG PO TABS Oral Take 400 mcg by mouth daily.      Marland Kitchen LISINOPRIL-HYDROCHLOROTHIAZIDE 20-12.5 MG PO TABS Oral Take 2 tablets by mouth daily. For blood pressure      BP 166/98  Pulse 88  Temp 98.1 F (36.7 C) (Oral)  Resp 20  SpO2 96%  Physical Exam  Nursing note and vitals  reviewed. Constitutional: He is oriented to person, place, and time. He appears well-developed and well-nourished.  HENT:  Head: Normocephalic and atraumatic.  Eyes: EOM are normal. Pupils are equal, round, and reactive to light.  Neck: Normal range of motion. Neck supple.  Cardiovascular: Normal rate, regular rhythm and normal heart sounds.   Pulmonary/Chest: Effort normal and breath sounds normal.  Neurological: He is alert and oriented to person, place, and time. He has normal strength. No cranial nerve deficit or sensory deficit. Gait normal.  Skin: Skin is warm and dry. He is not diaphoretic.  Psychiatric: His speech is normal and behavior is normal. His mood appears anxious. He is not actively hallucinating. Thought content is not delusional. Cognition and memory are normal. He exhibits a depressed mood. He expresses no homicidal ideation. He expresses no suicidal plans and no homicidal plans.       Patient crying during assessment    ED Course  Procedures (including critical care time)   Labs Reviewed  CBC  COMPREHENSIVE METABOLIC PANEL  ETHANOL  ACETAMINOPHEN LEVEL  URINE RAPID DRUG SCREEN (HOSP PERFORMED)   No results found.   No diagnosis found.    MDM  Patient presenting with depression.  No SI or HI at this time.  Patient has had intermittent thoughts throughout the week that he  would be better off dead, but no suicidal plan.  Telepsych consult has been ordered and patient has also been discussed with ACT team who will evaluate the patient.  Labs unremarkable.        Pascal Lux [place], PA-C 02/07/12 [phone]

## 2012-02-06 NOTE — ED Notes (Signed)
Pt reports that his house recently got foreclosed on and he was evicted from his apartment today. Pt states that he is supposed to get a large divorce settlement but people are keeping the money from him. Pt denies suicidal thoughts right now, but states he threatened suicide earlier this week to scare people.  Pt's wife is at bedside, states they are not divorced but have been separated for 2 years. States pt is very paranoid and delusional. Reports he told her earlier that next time she sees him he will be in the morgue. Wife also states that pt is having some slurred speech that is not normal for him. Pt states this started 2 weeks ago and is because of his stress level. Irving Burton, PA notified and states she will go see patient.

## 2012-02-06 NOTE — ED Notes (Signed)
Pt states "I know I've made some comments but I don't want to hurt myself, a lot of problems with my parents, I was caught up in their divorce, I am the oldest of 4, everything just built up Saturday, lived in a 1 bedroom apartment, ran out of money, I have 2 settlements coming, one from my wife for alienation and 1 from my work for [employer], settlements in the bank for retirement, called the Social worker office but they won't tell the truth, my father & pastor came Saturday to talk, they wanted me to go downtown , i had no transportation, they offered to take me, I just broke down crying and said life's not worth living, got evicted today, my father is a millionaire too but I'm the only 1 that knows, he's sick from smoking, I'm shocked that I've made it this far, had a bad experience at a dentist office, he was turning something in my mouth and I thought he was going to break something."

## 2012-02-07 NOTE — ED Provider Notes (Signed)
Medical screening examination/treatment/procedure(s) were performed by non-physician practitioner and as supervising physician I was immediately available for consultation/collaboration.  Jones Skene, M.D.     Jones Skene, MD 02/07/12 1218

## 2012-06-04 DIAGNOSIS — B353 Tinea pedis: Secondary | ICD-10-CM | POA: Insufficient documentation

## 2012-06-04 DIAGNOSIS — Z5900 Homelessness unspecified: Secondary | ICD-10-CM | POA: Insufficient documentation

## 2012-06-04 DIAGNOSIS — E7800 Pure hypercholesterolemia, unspecified: Secondary | ICD-10-CM | POA: Insufficient documentation

## 2012-06-04 DIAGNOSIS — Z86718 Personal history of other venous thrombosis and embolism: Secondary | ICD-10-CM | POA: Insufficient documentation

## 2012-06-04 DIAGNOSIS — Z7982 Long term (current) use of aspirin: Secondary | ICD-10-CM | POA: Insufficient documentation

## 2012-06-04 DIAGNOSIS — Z79899 Other long term (current) drug therapy: Secondary | ICD-10-CM | POA: Insufficient documentation

## 2012-06-04 DIAGNOSIS — I10 Essential (primary) hypertension: Secondary | ICD-10-CM | POA: Insufficient documentation

## 2012-06-05 ENCOUNTER — Encounter (HOSPITAL_COMMUNITY): Payer: Self-pay | Admitting: *Deleted

## 2012-06-05 ENCOUNTER — Emergency Department (HOSPITAL_COMMUNITY)
Admission: EM | Admit: 2012-06-05 | Discharge: 2012-06-05 | Disposition: A | Discharge status: Home / Self Care | Payer: Self-pay | Attending: Emergency Medicine | Admitting: Emergency Medicine

## 2012-06-05 DIAGNOSIS — Z590 Homelessness: Secondary | ICD-10-CM

## 2012-06-05 DIAGNOSIS — B353 Tinea pedis: Secondary | ICD-10-CM

## 2012-06-05 DIAGNOSIS — M7989 Other specified soft tissue disorders: Secondary | ICD-10-CM

## 2012-06-05 LAB — CBC WITH DIFFERENTIAL/PLATELET
Basophils Absolute: 0.1 10*3/uL (ref 0.0–0.1)
Basophils Relative: 1 % (ref 0–1)
Eosinophils Relative: 5 % (ref 0–5)
Lymphocytes Relative: 28 % (ref 12–46)
MCHC: 33.3 g/dL (ref 30.0–36.0)
MCV: 91.7 fL (ref 78.0–100.0)
Neutro Abs: 2.6 10*3/uL (ref 1.7–7.7)
Platelets: 182 10*3/uL (ref 150–400)
RDW: 14.1 % (ref 11.5–15.5)
WBC: 4.8 10*3/uL (ref 4.0–10.5)

## 2012-06-05 LAB — COMPREHENSIVE METABOLIC PANEL
ALT: 28 U/L (ref 0–53)
AST: 33 U/L (ref 0–37)
Albumin: 3.6 g/dL (ref 3.5–5.2)
CO2: 28 mEq/L (ref 19–32)
Calcium: 9.2 mg/dL (ref 8.4–10.5)
Creatinine, Ser: 0.84 mg/dL (ref 0.50–1.35)
GFR calc non Af Amer: >90 mL/min (ref >90)
Sodium: 138 mEq/L (ref 135–145)
Total Protein: 7.2 g/dL (ref 6.0–8.3)

## 2012-06-05 MED ORDER — TOLNAFTATE 1 % EX POWD: Freq: Three times a day (TID) | CUTANEOUS | Status: DC

## 2012-06-05 MED ORDER — ALUM SULFATE-CA ACETATE EX PACK
1.0000 | PACK | Freq: Three times a day (TID) | CUTANEOUS | Status: DC
  Administered 2012-06-05: 1 via TOPICAL
  Filled 2012-06-05 (×4): qty 1

## 2012-06-05 MED ORDER — DEXTROSE 5 % IV SOLN
1.0000 g | INTRAVENOUS | Status: DC
  Administered 2012-06-05: 1 g via INTRAVENOUS
  Filled 2012-06-05: qty 10

## 2012-06-05 MED ORDER — SODIUM CHLORIDE 0.9 % IV SOLN
INTRAVENOUS | Status: DC
  Administered 2012-06-05: 04:00:00 via INTRAVENOUS

## 2012-06-05 MED ORDER — TOLNAFTATE 1 % EX POWD
Freq: Three times a day (TID) | CUTANEOUS | Status: DC
  Administered 2012-06-05: 08:00:00 via TOPICAL
  Filled 2012-06-05: qty 45

## 2012-06-05 NOTE — ED Provider Notes (Signed)
History     CSN: [id number]  Arrival date & time 06/04/12  2355   First MD Initiated Contact with Patient 06/05/12 0058      No chief complaint on file.   (Consider location/radiation/quality/duration/timing/severity/associated sxs/prior treatment) HPI  Patient reports he is homeless. He reports he is recently been hanging out at [employer] and walking around the  lot picking up trash and pan handling. He states at night he sleeps on steps or benches. He has been wearing tennis shoes and is only wearing the hospital-type socks. States his legs had been swelling for couple weeks and his feet started getting blisters and swelling and now they are draining. He denies any fever. He denies any other medical problem.   Patient denies alcohol or drug abuse. He told me last time he lived in a house was 10 years ago however in August he had a psychiatric evaluation at that time was living in an apartment by himself however he was evicted. He denies feeling depressed. He states he has "a $10 million settlement" that is in a retirement fund that he received for alienation of affection in his divorce.  PCP [facility].   Past Medical History  Diagnosis Date  . DVT (deep venous thrombosis)   . Hypertension   . High cholesterol     History reviewed. No pertinent past surgical history.  No family history on file.  History  Substance Use Topics  . Smoking status: Never Smoker   . Smokeless tobacco: Not on file  . Alcohol Use: No  divorced homeless unemployed   Review of Systems  All other systems reviewed and are negative.    Allergies  Review of patient's allergies indicates no known allergies.  Home Medications   Current Outpatient Rx  Name  Route  Sig  Dispense  Refill  . ASPIRIN 81 MG PO CHEW   Oral   Chew 81 mg by mouth daily.           Marland Kitchen FOLIC ACID 400 MCG PO TABS   Oral   Take 400 mcg by mouth daily.           Marland Kitchen LISINOPRIL-HYDROCHLOROTHIAZIDE  20-12.5 MG PO TABS   Oral   Take 2 tablets by mouth daily. For blood pressure           BP 133/77  Pulse 75  Temp 98.2 F (36.8 C)  Resp 20  SpO2 100%  Vital signs normal    Physical Exam  Constitutional: He is oriented to person, place, and time. He appears well-developed and well-nourished.  Non-toxic appearance. He does not appear ill. No distress.  HENT:  Head: Normocephalic and atraumatic.  Right Ear: External ear normal.  Left Ear: External ear normal.  Nose: Nose normal. No mucosal edema or rhinorrhea.  Mouth/Throat: Oropharynx is clear and moist and mucous membranes are normal. No dental abscesses or uvula swelling.  Eyes: Conjunctivae normal and EOM are normal. Pupils are equal, round, and reactive to light.  Neck: Normal range of motion and full passive range of motion without pain. Neck supple.  Cardiovascular: Normal rate, regular rhythm and normal heart sounds.  Exam reveals no gallop and no friction rub.   No murmur heard. Pulmonary/Chest: Effort normal and breath sounds normal. No respiratory distress. He has no wheezes. He has no rhonchi. He has no rales. He exhibits no tenderness and no crepitus.  Abdominal: Soft. Normal appearance and bowel sounds are normal. He exhibits no distension. There  is no tenderness. There is no rebound and no guarding.  Musculoskeletal: Normal range of motion. He exhibits no edema and no tenderness.       Moves all extremities well.   Patient has chronic thickening of the skin of his lower legs with some thickened skin and scaling  of the skin around his ankle that appears chronic. He is a mild diffuse redness of his lower extremity and mild pitting edema. He has mild swelling of his feet which are diffusely red and warm to touch. He's noted to have black drainage between his toes and has a blister on the bottom of one of his middle  right toes consistent with tinea pedis bilaterally  Neurological: He is alert and oriented to person,  place, and time. He has normal strength. No cranial nerve deficit.  Skin: Skin is warm, dry and intact. No rash noted. No erythema. No pallor.  Psychiatric: He has a normal mood and affect. His speech is normal and behavior is normal. His mood appears not anxious.    ED Course  Procedures (including critical care time)   Medications  0.9 %  sodium chloride infusion (  Intravenous New Bag/Given 06/05/12 0420)  aluminum sulfate-calcium acetate (DOMEBORO) packet 1 packet (not administered)  cefTRIAXone (ROCEPHIN) 1 g in dextrose 5 % 50 mL IVPB (0 g Intravenous Stopped 06/05/12 0515)  tolnaftate (TINACTIN) 1 % powder (not administered)   Patient had his feet soaked in Domeboro solution and then Tinactin powder applied to his feet. He was given IV Rocephin for possible saline of his lower legs.  At change of shift  patient waiting to have Doppler ultrasound of his legs to rule out acute DVT. Patient has a history of DVT in the past. Dr Patria Mane will follow 07:30  Results for orders placed during the hospital encounter of 06/05/12  CBC WITH DIFFERENTIAL      Component Value Range   WBC 4.8  4.0 - 10.5 K/uL   RBC 4.81  4.22 - 5.81 MIL/uL   Hemoglobin 14.7  13.0 - 17.0 g/dL   HCT 16.1  09.6 - 04.5 %   MCV 91.7  78.0 - 100.0 fL   MCH 30.6  26.0 - 34.0 pg   MCHC 33.3  30.0 - 36.0 g/dL   RDW 40.9  81.1 - 91.4 %   Platelets 182  150 - 400 K/uL   Neutrophils Relative 54  43 - 77 %   Neutro Abs 2.6  1.7 - 7.7 K/uL   Lymphocytes Relative 28  12 - 46 %   Lymphs Abs 1.4  0.7 - 4.0 K/uL   Monocytes Relative 12  3 - 12 %   Monocytes Absolute 0.6  0.1 - 1.0 K/uL   Eosinophils Relative 5  0 - 5 %   Eosinophils Absolute 0.2  0.0 - 0.7 K/uL   Basophils Relative 1  0 - 1 %   Basophils Absolute 0.1  0.0 - 0.1 K/uL  COMPREHENSIVE METABOLIC PANEL      Component Value Range   Sodium 138  135 - 145 mEq/L   Potassium 3.7  3.5 - 5.1 mEq/L   Chloride 99  96 - 112 mEq/L   CO2 28  19 - 32 mEq/L    Glucose, Bld 102 (*) 70 - 99 mg/dL   BUN 11  6 - 23 mg/dL   Creatinine, Ser 7.82  0.50 - 1.35 mg/dL   Calcium 9.2  8.4 - 95.6 mg/dL   Total  Protein 7.2  6.0 - 8.3 g/dL   Albumin 3.6  3.5 - 5.2 g/dL   AST 33  0 - 37 U/L   ALT 28  0 - 53 U/L   Alkaline Phosphatase 59  39 - 117 U/L   Total Bilirubin 0.7  0.3 - 1.2 mg/dL   GFR calc non Af Amer >90  >90 mL/min   GFR calc Af Amer >90  >90 mL/min  D-DIMER, QUANTITATIVE      Component Value Range   D-Dimer, Quant 1.81 (*) 0.00 - 0.48 ug/mL-FEU   Laboratory interpretation all normal except elevated d-dimer     1. Tinea pedis   2. Bilateral lower leg cellulitis   3. Homeless    Disposition pending   Devoria Albe, MD, FACEP    MDM          Ward Givens, MD 06/05/12 [phone]

## 2012-06-05 NOTE — ED Provider Notes (Addendum)
8:43 AM Now the patient's feet are pink cleaned and soaked appear much better.  He has no signs of cellulitis of his lower extremities.  He is afebrile.  Awaiting ultrasound of his lower extremity although my suspicion for DVT is very low.  The patient will be given resources for homeless shelters.  Normal pulses in bilateral lower extremities.  Compartment soft.  No tenderness of his feet or his lower legs.  Lyanne Co, MD 06/05/12 [phone]  9:25 AM [place] negative for DVT  Lyanne Co, MD 06/05/12 [phone]

## 2012-06-05 NOTE — Clinical Social Work Note (Signed)
CSW gave homeless shelter resources to pt.  Pt was not familiar with day center.  CSW explained what the [facility]) has to offer as well as provided a list of shelters and addresses.  CSW explained that the [facility] have opened and the [facility] would be able to assist with that placement as well.  CSW provided a bus pass.  Pt to be d/c.  CSW signing off.  Vickii Penna, LCSWA [phone]  Clinical Social Work

## 2012-06-05 NOTE — ED Notes (Addendum)
Pt sts he is here tonight due to his blisters on his feet. Pt sts he has not been doing more walking than normal. Feet swollen, red and blistered. Pt denies any cardiac hx or diabetes. Pulses palpable bilaterally. Denies pain at this time.

## 2012-06-05 NOTE — ED Notes (Signed)
Per ems pt homeless; c/o blisters on bilateral feet

## 2012-06-05 NOTE — ED Notes (Signed)
Social Worker has seen patient related to shelter options.

## 2012-06-05 NOTE — Progress Notes (Addendum)
*  Preliminary Results* Bilateral lower extremity venous duplex completed. Visualized veins of the lower extremities are negative for deep vein thrombosis. Unable to visualize bilateral posterior tibial and peroneal veins due to swelling and edema, therefore unable to exclude deep vein thrombosis in these veins.  There is evidence of partial superficial vein thrombosis of the left greater saphenous vein extending from mid to distal left thigh. Varicose veins are noted in the left calf with no evidence of thrombosis.  06/05/2012 9:19 AM Gertie Fey, RDMS, RDCS

## 2012-06-08 ENCOUNTER — Emergency Department (HOSPITAL_COMMUNITY)
Admission: EM | Admit: 2012-06-08 | Discharge: 2012-06-09 | Disposition: A | Discharge status: Home / Self Care | Payer: Self-pay | Attending: Emergency Medicine | Admitting: Emergency Medicine

## 2012-06-08 ENCOUNTER — Encounter (HOSPITAL_COMMUNITY): Payer: Self-pay | Admitting: *Deleted

## 2012-06-08 DIAGNOSIS — Z5900 Homelessness unspecified: Secondary | ICD-10-CM | POA: Insufficient documentation

## 2012-06-08 DIAGNOSIS — L0390 Cellulitis, unspecified: Secondary | ICD-10-CM

## 2012-06-08 DIAGNOSIS — Y929 Unspecified place or not applicable: Secondary | ICD-10-CM | POA: Insufficient documentation

## 2012-06-08 DIAGNOSIS — Z86718 Personal history of other venous thrombosis and embolism: Secondary | ICD-10-CM | POA: Insufficient documentation

## 2012-06-08 DIAGNOSIS — E7800 Pure hypercholesterolemia, unspecified: Secondary | ICD-10-CM | POA: Insufficient documentation

## 2012-06-08 DIAGNOSIS — Y939 Activity, unspecified: Secondary | ICD-10-CM | POA: Insufficient documentation

## 2012-06-08 DIAGNOSIS — IMO0001 Reserved for inherently not codable concepts without codable children: Secondary | ICD-10-CM | POA: Insufficient documentation

## 2012-06-08 DIAGNOSIS — X58XXXA Exposure to other specified factors, initial encounter: Secondary | ICD-10-CM | POA: Insufficient documentation

## 2012-06-08 DIAGNOSIS — I10 Essential (primary) hypertension: Secondary | ICD-10-CM | POA: Insufficient documentation

## 2012-06-08 DIAGNOSIS — Z7982 Long term (current) use of aspirin: Secondary | ICD-10-CM | POA: Insufficient documentation

## 2012-06-08 DIAGNOSIS — L089 Local infection of the skin and subcutaneous tissue, unspecified: Secondary | ICD-10-CM | POA: Insufficient documentation

## 2012-06-08 DIAGNOSIS — S90426A Blister (nonthermal), unspecified lesser toe(s), initial encounter: Secondary | ICD-10-CM | POA: Insufficient documentation

## 2012-06-08 DIAGNOSIS — L03119 Cellulitis of unspecified part of limb: Secondary | ICD-10-CM | POA: Insufficient documentation

## 2012-06-08 DIAGNOSIS — L02619 Cutaneous abscess of unspecified foot: Secondary | ICD-10-CM | POA: Insufficient documentation

## 2012-06-08 MED ORDER — CLINDAMYCIN PHOSPHATE 900 MG/50ML IV SOLN
900.0000 mg | Freq: Once | INTRAVENOUS | Status: AC
  Administered 2012-06-09: 900 mg via INTRAVENOUS
  Filled 2012-06-08: qty 50

## 2012-06-08 NOTE — ED Notes (Addendum)
Pt was seen in ED and evaluated for DVT 12/26, hx of DVT. Pt called EMS today and at present has same c/o as of 12/26. Bil foot pain with blisters. Pain 8/10. Pt is homeless

## 2012-06-08 NOTE — ED Notes (Signed)
Pt ambulatory to exam room with steady gait. Pt states pain 8/10 to both feet and feels like a burning sensation.

## 2012-06-08 NOTE — ED Provider Notes (Signed)
History     CSN: [id number]  Arrival date & time 06/08/12  1755   None     Chief Complaint  Patient presents with  . Foot Problem  . blisters on feet     (Consider location/radiation/quality/duration/timing/severity/associated sxs/prior treatment) HPI Comments: Patient is a 56 year old male who presents with bilateral leg pain for the past week. Patient reports progressive worsening of moderate, aching pain. Ankle movement and weight bearing activity make the pain worse. Nothing makes the pain better. Patient reports associated swelling and redness. Patient has not tried anything for pain relief. Patient denies obvious deformity, numbness/tingling, coolness/weakness of extremity, bruising, and any other injury. Patient was seen for the same complaint 3 days ago. He was evaluated for a DVT which was unremarkable. Patient reports the pain is unchanged and his feet look the same as they did when he was last seen.      Past Medical History  Diagnosis Date  . DVT (deep venous thrombosis)   . Hypertension   . High cholesterol     History reviewed. No pertinent past surgical history.  History reviewed. No pertinent family history.  History  Substance Use Topics  . Smoking status: Never Smoker   . Smokeless tobacco: Not on file  . Alcohol Use: No      Review of Systems  Cardiovascular: Positive for leg swelling.  Musculoskeletal: Positive for myalgias and arthralgias.  All other systems reviewed and are negative.    Allergies  Review of patient's allergies indicates no known allergies.  Home Medications   Current Outpatient Rx  Name  Route  Sig  Dispense  Refill  . ASPIRIN 81 MG PO CHEW   Oral   Chew 81 mg by mouth daily.           Marland Kitchen FOLIC ACID 400 MCG PO TABS   Oral   Take 400 mcg by mouth daily.           Marland Kitchen LISINOPRIL-HYDROCHLOROTHIAZIDE 20-12.5 MG PO TABS   Oral   Take 2 tablets by mouth daily. For blood pressure         . TOLNAFTATE 1 % EX POWD  Topical   Apply topically 3 (three) times daily.   45 g   0     BP 152/87  Pulse 71  Temp 98.5 F (36.9 C) (Oral)  Resp 18  SpO2 100%  Physical Exam  Nursing note and vitals reviewed. Constitutional: He is oriented to person, place, and time. He appears well-developed and well-nourished. No distress.  HENT:  Head: Normocephalic and atraumatic.  Eyes: Conjunctivae normal and EOM are normal.  Neck: Normal range of motion. Neck supple.  Cardiovascular: Normal rate and regular rhythm.  Exam reveals no gallop and no friction rub.   No murmur heard.      2+ pitting edema of bilateral legs. Pulses difficult to palpate due to edema.   Pulmonary/Chest: Effort normal and breath sounds normal. He has no wheezes. He has no rales. He exhibits no tenderness.  Abdominal: Soft. He exhibits no distension. There is no tenderness. There is no rebound.  Musculoskeletal: He exhibits edema.       Notable edema of bilateral legs with erythematous overlying skin with multiple areas of blisters. Foul odor from the blisters.   Neurological: He is alert and oriented to person, place, and time. Coordination normal.       Speech is goal-oriented. Moves limbs without ataxia.   Skin: Skin is warm and dry.  See musculoskeletal.   Psychiatric: He has a normal mood and affect. His behavior is normal.    ED Course  Procedures (including critical care time)   Labs Reviewed  CBC WITH DIFFERENTIAL  BASIC METABOLIC PANEL  GLUCOSE, CAPILLARY   No results found.   No diagnosis found.    MDM  11:35 PM Labs pending.   1:07 AM Labs unremarkable. Patient has completed IV antibiotics. Patient reports unchanged condition of his feet. He was evaluated for DVT 3 days ago which was unremarkable. Patient's pain is no worse than the other day. Patient will be discharged without further evaluation.        Emilia Beck, PA-C 06/09/12 0110

## 2012-06-08 NOTE — ED Notes (Signed)
Pt BIB EMS. Pt walked to [facility] and called EMS. Pt states his feet are sore and swollen and he has neck pain. Pt ambulates with steady gait to ED lobby registration.

## 2012-06-09 LAB — BASIC METABOLIC PANEL
BUN: 12 mg/dL (ref 6–23)
CO2: 29 mEq/L (ref 19–32)
Calcium: 9.1 mg/dL (ref 8.4–10.5)
GFR calc non Af Amer: >90 mL/min (ref >90)
Glucose, Bld: 89 mg/dL (ref 70–99)
Sodium: 137 mEq/L (ref 135–145)

## 2012-06-09 LAB — CBC WITH DIFFERENTIAL/PLATELET
Eosinophils Absolute: 0.2 10*3/uL (ref 0.0–0.7)
Eosinophils Relative: 5 % (ref 0–5)
HCT: 41.9 % (ref 39.0–52.0)
Hemoglobin: 14.2 g/dL (ref 13.0–17.0)
Lymphocytes Relative: 31 % (ref 12–46)
Lymphs Abs: 1.4 10*3/uL (ref 0.7–4.0)
MCH: 31.1 pg (ref 26.0–34.0)
MCV: 91.9 fL (ref 78.0–100.0)
Monocytes Absolute: 0.5 10*3/uL (ref 0.1–1.0)
Monocytes Relative: 11 % (ref 3–12)
RBC: 4.56 MIL/uL (ref 4.22–5.81)
WBC: 4.5 10*3/uL (ref 4.0–10.5)

## 2012-06-09 MED ORDER — CLINDAMYCIN HCL 150 MG PO CAPS: 300.0000 mg | ORAL_CAPSULE | Freq: Three times a day (TID) | ORAL | Status: DC

## 2012-06-12 NOTE — ED Provider Notes (Signed)
Medical screening examination/treatment/procedure(s) were performed by non-physician practitioner and as supervising physician I was immediately available for consultation/collaboration.  Martha K Linker, MD 06/12/12 0707 

## 2012-06-30 ENCOUNTER — Encounter (HOSPITAL_COMMUNITY): Payer: Self-pay | Admitting: *Deleted

## 2012-06-30 ENCOUNTER — Emergency Department (HOSPITAL_COMMUNITY)
Admission: EM | Admit: 2012-06-30 | Discharge: 2012-07-01 | Disposition: A | Discharge status: Home / Self Care | Payer: Medicaid Other | Attending: Emergency Medicine | Admitting: Emergency Medicine

## 2012-06-30 DIAGNOSIS — Z86718 Personal history of other venous thrombosis and embolism: Secondary | ICD-10-CM | POA: Insufficient documentation

## 2012-06-30 DIAGNOSIS — E7800 Pure hypercholesterolemia, unspecified: Secondary | ICD-10-CM | POA: Insufficient documentation

## 2012-06-30 DIAGNOSIS — R1310 Dysphagia, unspecified: Secondary | ICD-10-CM

## 2012-06-30 DIAGNOSIS — Z7982 Long term (current) use of aspirin: Secondary | ICD-10-CM | POA: Insufficient documentation

## 2012-06-30 DIAGNOSIS — I10 Essential (primary) hypertension: Secondary | ICD-10-CM | POA: Insufficient documentation

## 2012-06-30 HISTORY — DX: Dysphagia, unspecified: R13.10

## 2012-06-30 NOTE — ED Provider Notes (Signed)
History     CSN: [id number]  Arrival date & time 06/30/12  2129   First MD Initiated Contact with Patient 06/30/12 2324      Chief Complaint  Patient presents with  . Sore Throat    (Consider location/radiation/quality/duration/timing/severity/associated sxs/prior treatment) HPI History provided by pt who is a poor historian and contradicts himself multiple times.  Pt reports dysphagia x 5 days.  Onset gradual, but inability to tolerate liquids and solids started simultaneously.  He is able to drink small sips of water at a time.  Initially reported that most recent meal was 2 days ago, but then stated later it was 5 days ago. Attributes sx to increased mucous in his throat.  Denies fever, sore throat, dyspnea and N/V.  No prior h/o dysphagia.   Past Medical History  Diagnosis Date  . DVT (deep venous thrombosis)   . Hypertension   . High cholesterol     History reviewed. No pertinent past surgical history.  No family history on file.  History  Substance Use Topics  . Smoking status: Never Smoker   . Smokeless tobacco: Not on file  . Alcohol Use: No      Review of Systems  All other systems reviewed and are negative.    Allergies  Review of patient's allergies indicates no known allergies.  Home Medications   Current Outpatient Rx  Name  Route  Sig  Dispense  Refill  . ASPIRIN 81 MG PO CHEW   Oral   Chew 81 mg by mouth every morning.            BP 152/85  Pulse 96  Temp 98.4 F (36.9 C) (Oral)  Resp 20  SpO2 98%  Physical Exam  Nursing note and vitals reviewed. Constitutional: He is oriented to person, place, and time. He appears well-developed and well-nourished. No distress.  HENT:  Head: Normocephalic and atraumatic.  Mouth/Throat: Oropharynx is clear and moist.  Eyes:       Normal appearance  Neck: Normal range of motion. Neck supple.       No palpable mass.  Pt able to swallow.  Nursing staff observed him drinking water and he did not  choke or cough.   Cardiovascular: Normal rate and regular rhythm.   Pulmonary/Chest: Effort normal and breath sounds normal. No stridor. No respiratory distress.  Abdominal: Soft. Bowel sounds are normal. He exhibits no distension. There is no tenderness.  Musculoskeletal: Normal range of motion.  Lymphadenopathy:    He has no cervical adenopathy.  Neurological: He is alert and oriented to person, place, and time.  Skin: Skin is warm and dry. No rash noted.  Psychiatric: He has a normal mood and affect. His behavior is normal.    ED Course  Procedures (including critical care time)  Labs Reviewed - No data to display Ct Soft Tissue Neck W Contrast  07/01/2012  *RADIOLOGY REPORT*  Clinical Data: Throat swelling; muffled voice.  Right toothache and flu symptoms for 4 weeks.  CT NECK WITH CONTRAST  Technique:  Multidetector CT imaging of the neck was performed with intravenous contrast.  Contrast: OMNIPAQUE IOHEXOL 300 MG/ML  SOLN  Comparison: None.  Findings: The nasopharynx, oropharynx and hypopharynx are unremarkable in appearance.  The palatine tonsils are within normal limits.  The valleculae and piriform sinuses appear intact.  Mild apparent narrowing at the level of the vocal cords is thought to be transient in nature.  The parapharyngeal fat planes are preserved. There is  no evidence of vascular compromise.  No cervical lymphadenopathy is seen.  The parotid and submandibular glands are unremarkable in appearance.  The thyroid gland is within normal limits.  The visualized superior mediastinum is grossly unremarkable.  Incidental note is made of a direct origin of the left vertebral artery from the aortic arch.  The visualized lung apices are clear.  The visualized paranasal sinuses and mastoid air cells are well-aerated.  Mild calcification is noted at the carotid bifurcations bilaterally, more prominent on the left, with minimal left-sided intramural thrombus seen.  No significant  luminal narrowing is appreciated.  Prevertebral soft tissues are within normal limits.  No acute osseous abnormalities are seen.  The visualized portions of the brain are unremarkable in appearance.  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  IMPRESSION:  1.  No acute abnormality seen with respect to the soft tissues of the neck. 2.  Mild calcification at the carotid bifurcations bilaterally, more prominent on the left, with minimal left-sided intramural thrombus.  No significant luminal narrowing seen. 3.  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   Original Report Authenticated By: Tonia Ghent, M.D.      1. Dysphagia       MDM  56yo homeless M who is a poor historian and is seen in ED frequently, presents w/ c/o dysphagia x 5 days.  Simultaneous onset of inability to swallow solids and fluids.  Tolerating po fluids w/out difficulty in ED.  CT neck negative.  Results discussed w/ pt.  Referred to GI for further evaluation.  I recommend that he stick to soft foods and fluids in the meantime.  Return precautions discussed.        Otilio Miu, PA-C 07/01/12 [phone]

## 2012-06-30 NOTE — ED Notes (Signed)
Per EMS pt picked up from [facility]; c/o right toothache and flu symptoms x 4 wks; c/o swollen lymph nodes

## 2012-06-30 NOTE — ED Notes (Signed)
Pt c/o swelling in throat; states whenever he swallows it comes back up; c/o having a lot of mucus and doesn't have the energy to clear his throat; muffled voice

## 2012-07-01 ENCOUNTER — Emergency Department (HOSPITAL_COMMUNITY): Payer: Medicaid Other

## 2012-07-01 LAB — POCT I-STAT, CHEM 8
BUN: 27 mg/dL — ABNORMAL HIGH (ref 6–23)
Creatinine, Ser: 0.8 mg/dL (ref 0.50–1.35)
Glucose, Bld: 77 mg/dL (ref 70–99)
Hemoglobin: 13.9 g/dL (ref 13.0–17.0)
Sodium: 143 mEq/L (ref 135–145)
TCO2: 25 mmol/L (ref 0–100)

## 2012-07-01 MED ORDER — IOHEXOL 300 MG/ML  SOLN
100.0000 mL | Freq: Once | INTRAMUSCULAR | Status: AC | PRN
  Administered 2012-07-01: 100 mL via INTRAVENOUS

## 2012-07-01 NOTE — ED Notes (Signed)
Pt taking sips of water without difficulty; no coughing or choking noted upon taking sips of water.

## 2012-07-06 NOTE — ED Provider Notes (Signed)
Medical screening examination/treatment/procedure(s) were performed by non-physician practitioner and as supervising physician I was immediately available for consultation/collaboration.  Isack Lavalley T Jariah Jarmon, MD 07/06/12 0716 

## 2012-07-10 ENCOUNTER — Emergency Department (HOSPITAL_COMMUNITY): Payer: Medicaid Other

## 2012-07-10 ENCOUNTER — Encounter (HOSPITAL_COMMUNITY): Payer: Self-pay

## 2012-07-10 ENCOUNTER — Inpatient Hospital Stay (HOSPITAL_COMMUNITY): Payer: Medicaid Other

## 2012-07-10 ENCOUNTER — Encounter (HOSPITAL_COMMUNITY): Payer: Self-pay | Admitting: Emergency Medicine

## 2012-07-10 ENCOUNTER — Inpatient Hospital Stay (HOSPITAL_COMMUNITY)
Admission: EM | Admit: 2012-07-10 | Discharge: 2012-08-06 | DRG: 004 | Disposition: A | Discharge status: Skilled Nursing Facility | Payer: Medicaid Other | Attending: Pulmonary Disease | Admitting: Pulmonary Disease

## 2012-07-10 ENCOUNTER — Emergency Department (HOSPITAL_COMMUNITY)
Admission: EM | Admit: 2012-07-10 | Discharge: 2012-07-10 | Disposition: A | Discharge status: Home / Self Care | Payer: Medicaid Other | Attending: Emergency Medicine | Admitting: Emergency Medicine

## 2012-07-10 DIAGNOSIS — I469 Cardiac arrest, cause unspecified: Secondary | ICD-10-CM | POA: Diagnosis present

## 2012-07-10 DIAGNOSIS — D649 Anemia, unspecified: Secondary | ICD-10-CM | POA: Diagnosis not present

## 2012-07-10 DIAGNOSIS — F410 Panic disorder [episodic paroxysmal anxiety] without agoraphobia: Secondary | ICD-10-CM | POA: Diagnosis present

## 2012-07-10 DIAGNOSIS — G7000 Myasthenia gravis without (acute) exacerbation: Secondary | ICD-10-CM

## 2012-07-10 DIAGNOSIS — J9600 Acute respiratory failure, unspecified whether with hypoxia or hypercapnia: Principal | ICD-10-CM | POA: Diagnosis present

## 2012-07-10 DIAGNOSIS — H5460 Unqualified visual loss, one eye, unspecified: Secondary | ICD-10-CM | POA: Diagnosis present

## 2012-07-10 DIAGNOSIS — G709 Myoneural disorder, unspecified: Secondary | ICD-10-CM

## 2012-07-10 DIAGNOSIS — Z8672 Personal history of thrombophlebitis: Secondary | ICD-10-CM

## 2012-07-10 DIAGNOSIS — R7309 Other abnormal glucose: Secondary | ICD-10-CM | POA: Diagnosis not present

## 2012-07-10 DIAGNOSIS — R1310 Dysphagia, unspecified: Secondary | ICD-10-CM | POA: Diagnosis present

## 2012-07-10 DIAGNOSIS — Z5900 Homelessness unspecified: Secondary | ICD-10-CM | POA: Insufficient documentation

## 2012-07-10 DIAGNOSIS — I82409 Acute embolism and thrombosis of unspecified deep veins of unspecified lower extremity: Secondary | ICD-10-CM | POA: Diagnosis not present

## 2012-07-10 DIAGNOSIS — Z86718 Personal history of other venous thrombosis and embolism: Secondary | ICD-10-CM | POA: Insufficient documentation

## 2012-07-10 DIAGNOSIS — K2990 Gastroduodenitis, unspecified, without bleeding: Secondary | ICD-10-CM | POA: Diagnosis present

## 2012-07-10 DIAGNOSIS — R1313 Dysphagia, pharyngeal phase: Secondary | ICD-10-CM | POA: Insufficient documentation

## 2012-07-10 DIAGNOSIS — J690 Pneumonitis due to inhalation of food and vomit: Secondary | ICD-10-CM | POA: Diagnosis present

## 2012-07-10 DIAGNOSIS — Z7982 Long term (current) use of aspirin: Secondary | ICD-10-CM | POA: Insufficient documentation

## 2012-07-10 DIAGNOSIS — E46 Unspecified protein-calorie malnutrition: Secondary | ICD-10-CM | POA: Diagnosis not present

## 2012-07-10 DIAGNOSIS — E7800 Pure hypercholesterolemia, unspecified: Secondary | ICD-10-CM

## 2012-07-10 DIAGNOSIS — J9690 Respiratory failure, unspecified, unspecified whether with hypoxia or hypercapnia: Secondary | ICD-10-CM

## 2012-07-10 DIAGNOSIS — K2970 Gastritis, unspecified, without bleeding: Secondary | ICD-10-CM | POA: Diagnosis present

## 2012-07-10 DIAGNOSIS — I82402 Acute embolism and thrombosis of unspecified deep veins of left lower extremity: Secondary | ICD-10-CM

## 2012-07-10 DIAGNOSIS — K769 Liver disease, unspecified: Secondary | ICD-10-CM | POA: Diagnosis present

## 2012-07-10 DIAGNOSIS — T17908A Unspecified foreign body in respiratory tract, part unspecified causing other injury, initial encounter: Secondary | ICD-10-CM

## 2012-07-10 DIAGNOSIS — E876 Hypokalemia: Secondary | ICD-10-CM | POA: Diagnosis present

## 2012-07-10 DIAGNOSIS — T17800A Unspecified foreign body in other parts of respiratory tract causing asphyxiation, initial encounter: Secondary | ICD-10-CM | POA: Diagnosis present

## 2012-07-10 DIAGNOSIS — I10 Essential (primary) hypertension: Secondary | ICD-10-CM | POA: Diagnosis present

## 2012-07-10 DIAGNOSIS — Z930 Tracheostomy status: Secondary | ICD-10-CM

## 2012-07-10 DIAGNOSIS — G7001 Myasthenia gravis with (acute) exacerbation: Secondary | ICD-10-CM | POA: Diagnosis present

## 2012-07-10 DIAGNOSIS — H02409 Unspecified ptosis of unspecified eyelid: Secondary | ICD-10-CM | POA: Diagnosis present

## 2012-07-10 DIAGNOSIS — K2100 Gastro-esophageal reflux disease with esophagitis, without bleeding: Secondary | ICD-10-CM | POA: Diagnosis present

## 2012-07-10 DIAGNOSIS — D696 Thrombocytopenia, unspecified: Secondary | ICD-10-CM | POA: Diagnosis not present

## 2012-07-10 DIAGNOSIS — R160 Hepatomegaly, not elsewhere classified: Secondary | ICD-10-CM

## 2012-07-10 DIAGNOSIS — E8779 Other fluid overload: Secondary | ICD-10-CM | POA: Diagnosis not present

## 2012-07-10 HISTORY — DX: Myasthenia gravis without (acute) exacerbation: G70.00

## 2012-07-10 LAB — BLOOD GAS, ARTERIAL
Acid-base deficit: 3 mmol/L — ABNORMAL HIGH (ref 0.0–2.0)
Bicarbonate: 23.1 mEq/L (ref 20.0–24.0)
Drawn by: 11249
FIO2: 1 %
MECHVT: 620 mL
O2 Saturation: 99.4 %
PEEP: 5 cmH2O
Patient temperature: 98.6
RATE: 15 resp/min
TCO2: 20.8 mmol/L (ref 0–100)
pCO2 arterial: 47.5 mmHg — ABNORMAL HIGH (ref 35.0–45.0)
pH, Arterial: 7.309 — ABNORMAL LOW (ref 7.350–7.450)
pO2, Arterial: 315 mmHg — ABNORMAL HIGH (ref 80.0–100.0)

## 2012-07-10 LAB — MAGNESIUM: Magnesium: 2.3 mg/dL (ref 1.5–2.5)

## 2012-07-10 LAB — COMPREHENSIVE METABOLIC PANEL
ALT: 15 U/L (ref 0–53)
AST: 28 U/L (ref 0–37)
Albumin: 3.7 g/dL (ref 3.5–5.2)
Alkaline Phosphatase: 59 U/L (ref 39–117)
BUN: 21 mg/dL (ref 6–23)
CO2: 23 mEq/L (ref 19–32)
Calcium: 9 mg/dL (ref 8.4–10.5)
Chloride: 101 mEq/L (ref 96–112)
Creatinine, Ser: 0.6 mg/dL (ref 0.50–1.35)
GFR calc Af Amer: >90 mL/min (ref >90)
GFR calc non Af Amer: >90 mL/min (ref >90)
Glucose, Bld: 99 mg/dL (ref 70–99)
Potassium: 3.7 mEq/L (ref 3.5–5.1)
Sodium: 139 mEq/L (ref 135–145)
Total Bilirubin: 1.3 mg/dL — ABNORMAL HIGH (ref 0.3–1.2)
Total Protein: 7.9 g/dL (ref 6.0–8.3)

## 2012-07-10 LAB — URINE MICROSCOPIC-ADD ON

## 2012-07-10 LAB — BASIC METABOLIC PANEL
BUN: 19 mg/dL (ref 6–23)
CO2: 22 mEq/L (ref 19–32)
Calcium: 8.6 mg/dL (ref 8.4–10.5)
Chloride: 100 mEq/L (ref 96–112)
Creatinine, Ser: 0.66 mg/dL (ref 0.50–1.35)
GFR calc Af Amer: >90 mL/min (ref >90)
GFR calc non Af Amer: >90 mL/min (ref >90)
Glucose, Bld: 131 mg/dL — ABNORMAL HIGH (ref 70–99)
Potassium: 3.2 mEq/L — ABNORMAL LOW (ref 3.5–5.1)
Sodium: 138 mEq/L (ref 135–145)

## 2012-07-10 LAB — TROPONIN I: Troponin I: <0.3 ng/mL (ref <0.30)

## 2012-07-10 LAB — RAPID URINE DRUG SCREEN, HOSP PERFORMED
Amphetamines: NOT DETECTED
Barbiturates: NOT DETECTED
Benzodiazepines: NOT DETECTED
Cocaine: NOT DETECTED
Opiates: NOT DETECTED
Tetrahydrocannabinol: NOT DETECTED

## 2012-07-10 LAB — GLUCOSE, CAPILLARY: Glucose-Capillary: 106 mg/dL — ABNORMAL HIGH (ref 70–99)

## 2012-07-10 LAB — CBC
HCT: 44 % (ref 39.0–52.0)
HCT: 44.8 % (ref 39.0–52.0)
Hemoglobin: 14.8 g/dL (ref 13.0–17.0)
Hemoglobin: 14.9 g/dL (ref 13.0–17.0)
MCH: 30.3 pg (ref 26.0–34.0)
MCH: 30.8 pg (ref 26.0–34.0)
MCHC: 33.6 g/dL (ref 30.0–36.0)
MCHC: 33.3 g/dL (ref 30.0–36.0)
MCV: 90 fL (ref 78.0–100.0)
MCV: 92.6 fL (ref 78.0–100.0)
Platelets: 170 10*3/uL (ref 150–400)
Platelets: 159 10*3/uL (ref 150–400)
RBC: 4.89 MIL/uL (ref 4.22–5.81)
RBC: 4.84 MIL/uL (ref 4.22–5.81)
RDW: 13.7 % (ref 11.5–15.5)
RDW: 13.6 % (ref 11.5–15.5)
WBC: 8.9 10*3/uL (ref 4.0–10.5)
WBC: 10.6 10*3/uL — ABNORMAL HIGH (ref 4.0–10.5)

## 2012-07-10 LAB — URINALYSIS, ROUTINE W REFLEX MICROSCOPIC
Glucose, UA: NEGATIVE mg/dL
Ketones, ur: >80 mg/dL — AB
Leukocytes, UA: NEGATIVE
Nitrite: NEGATIVE
Protein, ur: 100 mg/dL — AB
Specific Gravity, Urine: >1.046 — ABNORMAL HIGH (ref 1.005–1.030)
Urobilinogen, UA: 1 mg/dL (ref 0.0–1.0)
pH: 5.5 (ref 5.0–8.0)

## 2012-07-10 LAB — PHOSPHORUS: Phosphorus: 3.6 mg/dL (ref 2.3–4.6)

## 2012-07-10 LAB — ETHANOL: Alcohol, Ethyl (B): <11 mg/dL (ref 0–11)

## 2012-07-10 LAB — LACTIC ACID, PLASMA: Lactic Acid, Venous: 1.3 mmol/L (ref 0.5–2.2)

## 2012-07-10 MED ORDER — LORAZEPAM 2 MG/ML IJ SOLN
1.0000 mg | Freq: Once | INTRAMUSCULAR | Status: AC
  Administered 2012-07-10: 1 mg via INTRAVENOUS
  Filled 2012-07-10: qty 1

## 2012-07-10 MED ORDER — FENTANYL BOLUS VIA INFUSION
25.0000 ug | Freq: Four times a day (QID) | INTRAVENOUS | Status: DC | PRN
  Filled 2012-07-10: qty 100

## 2012-07-10 MED ORDER — IPRATROPIUM BROMIDE HFA 17 MCG/ACT IN AERS: 4.0000 | INHALATION_SPRAY | RESPIRATORY_TRACT | Status: DC | PRN

## 2012-07-10 MED ORDER — ETOMIDATE 2 MG/ML IV SOLN
INTRAVENOUS | Status: AC
  Filled 2012-07-10: qty 20

## 2012-07-10 MED ORDER — PROPOFOL 10 MG/ML IV EMUL
5.0000 ug/kg/min | Freq: Once | INTRAVENOUS | Status: AC
  Administered 2012-07-10: 50 ug/kg/min via INTRAVENOUS

## 2012-07-10 MED ORDER — SODIUM CHLORIDE 0.9 % IV SOLN
3.0000 g | Freq: Four times a day (QID) | INTRAVENOUS | Status: DC
  Administered 2012-07-11 – 2012-07-15 (×18): 3 g via INTRAVENOUS
  Filled 2012-07-10 (×19): qty 3

## 2012-07-10 MED ORDER — PROPOFOL 10 MG/ML IV EMUL
5.0000 ug/kg/min | INTRAVENOUS | Status: DC
  Administered 2012-07-11: 15 ug/kg/min via INTRAVENOUS
  Administered 2012-07-11: 5 ug/kg/min via INTRAVENOUS
  Filled 2012-07-10: qty 100

## 2012-07-10 MED ORDER — SODIUM CHLORIDE 0.9 % IV SOLN
Freq: Once | INTRAVENOUS | Status: AC
  Administered 2012-07-10: 23:00:00 via INTRAVENOUS

## 2012-07-10 MED ORDER — POTASSIUM CHLORIDE 10 MEQ/100ML IV SOLN
10.0000 meq | INTRAVENOUS | Status: AC
  Administered 2012-07-10 – 2012-07-11 (×4): 10 meq via INTRAVENOUS
  Filled 2012-07-10: qty 100
  Filled 2012-07-10: qty 300

## 2012-07-10 MED ORDER — SODIUM CHLORIDE 0.9 % IV BOLUS (SEPSIS)
1000.0000 mL | Freq: Once | INTRAVENOUS | Status: AC
  Administered 2012-07-10: 1000 mL via INTRAVENOUS

## 2012-07-10 MED ORDER — FAMOTIDINE IN NACL 20-0.9 MG/50ML-% IV SOLN
20.0000 mg | Freq: Two times a day (BID) | INTRAVENOUS | Status: DC
  Administered 2012-07-11 – 2012-07-14 (×9): 20 mg via INTRAVENOUS
  Filled 2012-07-10 (×10): qty 50

## 2012-07-10 MED ORDER — ASPIRIN 300 MG RE SUPP: 300.0000 mg | RECTAL | Status: AC

## 2012-07-10 MED ORDER — LEVOFLOXACIN IN D5W 750 MG/150ML IV SOLN
750.0000 mg | Freq: Every day | INTRAVENOUS | Status: DC
  Administered 2012-07-11 – 2012-07-14 (×5): 750 mg via INTRAVENOUS
  Filled 2012-07-10 (×5): qty 150

## 2012-07-10 MED ORDER — ENOXAPARIN SODIUM 40 MG/0.4ML ~~LOC~~ SOLN
40.0000 mg | SUBCUTANEOUS | Status: DC
  Administered 2012-07-11: 40 mg via SUBCUTANEOUS
  Filled 2012-07-10: qty 0.4

## 2012-07-10 MED ORDER — SUCCINYLCHOLINE CHLORIDE 20 MG/ML IJ SOLN
INTRAMUSCULAR | Status: AC
  Filled 2012-07-10: qty 5

## 2012-07-10 MED ORDER — ROCURONIUM BROMIDE 50 MG/5ML IV SOLN
INTRAVENOUS | Status: AC
  Filled 2012-07-10: qty 2

## 2012-07-10 MED ORDER — PROPOFOL 10 MG/ML IV EMUL
INTRAVENOUS | Status: AC
  Filled 2012-07-10: qty 100

## 2012-07-10 MED ORDER — FENTANYL CITRATE 0.05 MG/ML IJ SOLN
INTRAMUSCULAR | Status: AC
  Filled 2012-07-10: qty 2

## 2012-07-10 MED ORDER — ENOXAPARIN SODIUM 40 MG/0.4ML ~~LOC~~ SOLN: 40.0000 mg | SUBCUTANEOUS | Status: DC

## 2012-07-10 MED ORDER — LIDOCAINE HCL (CARDIAC) 20 MG/ML IV SOLN
INTRAVENOUS | Status: AC
  Filled 2012-07-10: qty 5

## 2012-07-10 MED ORDER — IOHEXOL 300 MG/ML  SOLN
100.0000 mL | Freq: Once | INTRAMUSCULAR | Status: AC | PRN
  Administered 2012-07-10: 100 mL via INTRAVENOUS

## 2012-07-10 MED ORDER — SODIUM CHLORIDE 0.9 % IV SOLN
25.0000 ug/h | INTRAVENOUS | Status: DC
  Filled 2012-07-10: qty 50

## 2012-07-10 MED ORDER — PROPOFOL 10 MG/ML IV BOLUS
INTRAVENOUS | Status: AC
  Filled 2012-07-10: qty 1

## 2012-07-10 MED ORDER — ASPIRIN 81 MG PO CHEW
324.0000 mg | CHEWABLE_TABLET | ORAL | Status: AC
  Administered 2012-07-11: 324 mg via ORAL
  Filled 2012-07-10: qty 4

## 2012-07-10 MED ORDER — SODIUM CHLORIDE 0.9 % IV SOLN: 250.0000 mL | INTRAVENOUS | Status: DC | PRN

## 2012-07-10 MED ORDER — POTASSIUM CHLORIDE 20 MEQ/15ML (10%) PO LIQD
40.0000 meq | Freq: Once | ORAL | Status: AC
  Administered 2012-07-11: 40 meq
  Filled 2012-07-10: qty 30

## 2012-07-10 MED FILL — Medication: Qty: 1 | Status: AC

## 2012-07-10 NOTE — Progress Notes (Signed)
ANTIBIOTIC CONSULT NOTE - INITIAL  Pharmacy Consult for Levaquin/Unasyn Indication: Aspiration PNA  No Known Allergies  Patient Measurements: Height: 6' (182.9 cm) Weight: 242 lb 8.1 oz (110 kg) IBW/kg (Calculated) : 77.6    Vital Signs: Temp: 98.4 F (36.9 C) (01/30 2345) BP: 101/54 mmHg (01/30 2345) Pulse Rate: 92  (01/30 2345) Intake/Output from previous day:   Intake/Output from this shift: Total I/O In: 1000 [I.V.:1000] Out: 550 [Stool:550]  Labs:  [facility] 07/10/12 2011 07/10/12 1645  WBC 10.6* 8.9  HGB 14.9 14.8  PLT 159 170  LABCREA -- --  CREATININE 0.66 0.60   Estimated Creatinine Clearance: 132.1 ml/min (by C-G formula based on Cr of 0.66). No results found for this basename: VANCOTROUGH:2,VANCOPEAK:2,VANCORANDOM:2,GENTTROUGH:2,GENTPEAK:2,GENTRANDOM:2,TOBRATROUGH:2,TOBRAPEAK:2,TOBRARND:2,AMIKACINPEAK:2,AMIKACINTROU:2,AMIKACIN:2, in the last 72 hours   Microbiology: No results found for this or any previous visit (from the past 720 hour(s)).  Medical History: Past Medical History  Diagnosis Date  . DVT (deep venous thrombosis)   . Hypertension   . High cholesterol     Medications:  Scheduled:    . aspirin  324 mg Oral NOW   Or  . aspirin  300 mg Rectal NOW  . enoxaparin (LOVENOX) injection  40 mg Subcutaneous Q24H  . [COMPLETED] LORazepam  1 mg Intravenous Once  . potassium chloride  40 mEq Per Tube Once  . [DISCONTINUED] enoxaparin (LOVENOX) injection  40 mg Subcutaneous Q24H   Infusions:    . [COMPLETED] sodium chloride 100 mL/hr at 07/10/12 2252  . sodium chloride    . ampicillin-sulbactam (UNASYN) IV    . famotidine (PEPCID) IV    . fentaNYL infusion INTRAVENOUS    . levofloxacin (LEVAQUIN) IV    . potassium chloride 10 mEq (07/10/12 2329)  . [COMPLETED] propofol 50 mcg/kg/min (07/10/12 2035)  . propofol     Assessment: 57 yo homeless male c/o of dysphagia.  S/p procedure pt developed respiratory and cardiac arrest and is now  intubated.  MD ordering Unasyn and Levaquin for aspiration PNA.  Goal of Therapy:  Vancomycin trough level 15-20 mcg/ml  Plan:   Levaquin 750mg  IV q24h.  Unasyn 3 Gm IV q6h.  F/U Scr as needed.  Susanne Greenhouse R 07/10/2012,11:55 PM

## 2012-07-10 NOTE — ED Notes (Signed)
Report to [employer].Marland Kitchen  Ready for pt-will obtain blood cultures prior to transport to ICU.  O2 sat decreased to 88%-Amy RT notified Dr. Kendrick Fries and FIO2 increased to 60% with 10 PEEP.

## 2012-07-10 NOTE — ED Notes (Signed)
Bed:WA09<BR> Expected date:<BR> Expected time:<BR> Means of arrival:<BR> Comments:<BR>

## 2012-07-10 NOTE — ED Notes (Signed)
Attempted to notify family member per phone number obtained by [facility] officer.  The number is no longer in service.

## 2012-07-10 NOTE — ED Notes (Signed)
Narrative note-This Clinical research associate called to bedside of pt-apneic and with no pulse-moved immediately to Resc A and CPR started and pt bagged with 100% FIO2-Dr. Juleen China at bedside and intubated pt-see Code sheet for meds-pt regained pulse with good volume-MP with initial junctional rhythm with PVC's and then NSR with no ectopy-stable BP-#16 Fr OGT placed-CXR for ETT and OGT placement-temp foley placed and pt on vent per RT-see documented settings-Propofol at 20 mg/hour and pt attempting to pull ETT-bolused x 2 with Propofol 50 mg.  Pt currently sedated.

## 2012-07-10 NOTE — ED Notes (Signed)
Brought in by EMS from [facility]--- [facility] staff called EMS on behalf of pt due to "difficulty swallowing".  Per EMS, pt was diagnosed here on 06/30/2012 with Dysphagia but pt unable to follow-up with a specialist as per discharge instructions, pt is "homeless"; pt arrived to room ED drowsy but A/Ox4, denies drinking alcohol, states "I'm just weak--- I haven't eaten for 10 days".

## 2012-07-10 NOTE — Procedures (Signed)
Central Venous Catheter Insertion Procedure Note DAQUANE AGUILAR [id number] 1956/05/24  Procedure: Insertion of Central Venous Catheter Indications: Assessment of intravascular volume and Drug and/or fluid administration  Procedure Details Consent: Unable to obtain consent because of altered level of consciousness. Time Out: Verified patient identification, verified procedure, site/side was marked, verified correct patient position, special equipment/implants available, medications/allergies/relevent history reviewed, required imaging and test results available.  Performed  Maximum sterile technique was used including antiseptics, cap, gloves, gown, hand hygiene, mask and sheet. Skin prep: Chlorhexidine; local anesthetic administered A antimicrobial bonded/coated triple lumen catheter was placed in the left subclavian vein using the Seldinger technique.  Evaluation Blood flow good Complications: No apparent complications Patient did tolerate procedure well. Chest X-ray ordered to verify placement.  CXR: pending.  Telitha Plath 07/10/2012, 10:56 PM

## 2012-07-10 NOTE — ED Notes (Signed)
ZOX:[id number]<AV> Expected date:<BR> Expected time:<BR> Means of arrival:<BR> Comments:<BR> EMS/56 yo male with difficulty swallowing

## 2012-07-10 NOTE — ED Provider Notes (Signed)
History     CSN: [id number]  Arrival date & time 07/10/12  0151   First MD Initiated Contact with Patient 07/10/12 0154      Chief Complaint  Patient presents with  . Dysphagia    (Consider location/radiation/quality/duration/timing/severity/associated sxs/prior treatment) HPI Comments: Patient states he has only been able to drink small amounts of liquids for the past 2 weeks Was seen this ED 1/20 for the same with a negative work up and referral to GI which he did not.  Denies fever URI symptoms, trauma.  States he is homeless, his family lives in town but will not allow him to stay in their home.   The history is provided by the patient.    Past Medical History  Diagnosis Date  . DVT (deep venous thrombosis)   . Hypertension   . High cholesterol     History reviewed. No pertinent past surgical history.  History reviewed. No pertinent family history.  History  Substance Use Topics  . Smoking status: Never Smoker   . Smokeless tobacco: Not on file  . Alcohol Use: No      Review of Systems  Constitutional: Negative for fever and chills.  HENT: Positive for trouble swallowing. Negative for sore throat and drooling.   Respiratory: Negative for cough and shortness of breath.   Cardiovascular: Negative for chest pain.  Gastrointestinal: Negative for nausea and vomiting.  Skin: Negative for wound.  Neurological: Negative for dizziness.    Allergies  Review of patient's allergies indicates no known allergies.  Home Medications   Current Outpatient Rx  Name  Route  Sig  Dispense  Refill  . ASPIRIN 81 MG PO CHEW   Oral   Chew 81 mg by mouth every morning.            BP 136/73  Pulse 90  Temp 98.1 F (36.7 C) (Oral)  Resp 18  SpO2 97%  Physical Exam  Constitutional: He appears well-developed and well-nourished.  HENT:  Head: Normocephalic. No trismus in the jaw.  Mouth/Throat: Uvula is midline and oropharynx is clear and moist. Dental caries present.  No oropharyngeal exudate.  Neck: Normal range of motion.  Cardiovascular: Normal rate.   Pulmonary/Chest: Effort normal.  Abdominal: Soft.  Musculoskeletal: Normal range of motion.  Lymphadenopathy:    He has no cervical adenopathy.  Neurological: He is alert.  Skin: Skin is warm and dry.    ED Course  Procedures (including critical care time)  Labs Reviewed - No data to display Dg Chest 2 View  07/10/2012  *RADIOLOGY REPORT*  Clinical Data: Dysphagia  CHEST - 2 VIEW  Comparison: 05/14/2006  Findings: Mild aortic tortuosity.  Heart size upper normal.  No confluent airspace opacity.  No pleural effusion or pneumothorax. Mild multilevel degenerative change.  No acute osseous finding.  IMPRESSION: No radiographic evidence of acute cardiopulmonary process.   Original Report Authenticated By: Jearld Lesch, M.D.      1. Dysphagia, pharyngeal       MDM  Patient able to swallow liquids    PAteint in no distress but will not take more than a sip at a time       Arman Filter, NP 07/10/12 0537  Arman Filter, NP 07/10/12 [phone]

## 2012-07-10 NOTE — ED Notes (Signed)
Pt urine output increased.  200 ml in foley bag

## 2012-07-10 NOTE — ED Notes (Signed)
Pt was offered grape-cran juice---- pt takes very small sips which he tolerates, no coughing noted with swallowing; pt was encouraged to take bigger sips-- pt hesitant and refused.

## 2012-07-10 NOTE — ED Notes (Signed)
Unable to take xray of esophagus due to patient getting weak and feeling like he was going to pass out.  MD notified by radiology techs.

## 2012-07-10 NOTE — ED Notes (Signed)
Per EMS- Patient was seen on 1/20 and 1/30 for dysphagia. Patient was escorted to the bus stop this AM by security. Patient did not gt on the bus. EMS was called by a bystander. Patient was given an albuterol neb treatment prior to bring to the ED. Patient difficult to understand. Patient continues to state that he can not swallow. Patient states he drinks small sips. Patient currently with saliva in his mouth.

## 2012-07-10 NOTE — ED Notes (Signed)
Patient being repositioned by techs when he started becoming cyanotic.  Dr. Juleen China advised and patient moved to resuscitation A.

## 2012-07-10 NOTE — ED Notes (Signed)
Dr. Kathrin Penner at bedside and placed triple lumen to left subclavian-pt responds to pain-Propofol at 27.7 mcg/kg/min per right wrist IV-CXR requested to verify placement of triple lumen-mouth suctioned for large amount thick clear secretions.  MP SR with rate 90's.

## 2012-07-10 NOTE — ED Notes (Signed)
Dr. Kendrick Fries adjusted vent rate to 20.

## 2012-07-10 NOTE — Progress Notes (Signed)
Patient placed on Ventilator. 8.0 cm tube placed at 22@ lip. Pt is on PRVC VT of 620; rate 15; peep of 5; and FIO2 of 100%. Patient stable at this time. Will continue to monitor.

## 2012-07-10 NOTE — Progress Notes (Addendum)
Changed FIO2 from 50% to 60% and changed peep from 5 to 10, due to low oxygen sats..per Dr. Kendrick Fries

## 2012-07-10 NOTE — ED Notes (Signed)
Pt holding head up by jaw with hands.  Denies any pain or trauma to the area.  Alert and oriented x3.

## 2012-07-10 NOTE — ED Provider Notes (Signed)
Medical screening examination/treatment/procedure(s) were performed by non-physician practitioner and as supervising physician I was immediately available for consultation/collaboration.  Arlene Genova, MD 07/10/12 0550 

## 2012-07-10 NOTE — ED Provider Notes (Addendum)
History    57 year old male with dysphasia. This is patient's third ER evaluation for the same. He was evaluated on January 20 the same. His CT soft tissue structures unremarkable. As evaluated last night as well and discharge. He was escorted by security to get on a bus which he did not do. A bystander apparently stopped and called EMS. It is very difficult to obtain a good history from patient. Is complaining of difficulty swallowing both solids and liquids. He is able to get small sips of liquids down though. He states that symptoms started about 2 weeks ago and have been persistent. No foreign body sensation. No history of similar symptoms prior to 2 weeks ago. Does not feel short of breath. Denies any pain. Denies any smoking history or tobacco use. Denies previous radiation.  Reliability of history is somewhat questionable.    CSN: [id number]  Arrival date & time 07/10/12  1310   First MD Initiated Contact with Patient 07/10/12 1538      Chief Complaint  Patient presents with  . Dysphagia    (Consider location/radiation/quality/duration/timing/severity/associated sxs/prior treatment) HPI  Past Medical History  Diagnosis Date  . DVT (deep venous thrombosis)   . Hypertension   . High cholesterol     History reviewed. No pertinent past surgical history.  History reviewed. No pertinent family history.  History  Substance Use Topics  . Smoking status: Never Smoker   . Smokeless tobacco: Not on file  . Alcohol Use: No      Review of Systems  All systems reviewed and negative, other than as noted in HPI.   Allergies  Review of patient's allergies indicates no known allergies.  Home Medications   Current Outpatient Rx  Name  Route  Sig  Dispense  Refill  . ASPIRIN 81 MG PO CHEW   Oral   Chew 81 mg by mouth every morning.            BP 140/78  Pulse 92  Resp 20  SpO2 100%  Physical Exam  Nursing note and vitals reviewed. Constitutional: He appears  well-developed and well-nourished. No distress.  HENT:  Head: Normocephalic and atraumatic.       Patient sitting up holding his chin up with his hands. He is handling his secretions. There is no stridor. His posterior pharynx is clear. Uvula is midline. No tongue elevation. Submental tissues are soft. No cervical adenopathy. He has generally poor dentition.  Eyes: Conjunctivae normal are normal. Right eye exhibits no discharge. Left eye exhibits no discharge.  Neck: Neck supple.  Cardiovascular: Normal rate, regular rhythm and normal heart sounds.  Exam reveals no gallop and no friction rub.   No murmur heard. Pulmonary/Chest: Effort normal and breath sounds normal. No respiratory distress.  Abdominal: Soft. He exhibits no distension. There is no tenderness.       Abdominal aorta palpable, but does not feel aneurysmal  Musculoskeletal: He exhibits no edema and no tenderness.  Neurological: He is alert.  Skin: Skin is warm and dry.  Psychiatric: His behavior is normal. Thought content normal.    ED Course  INTUBATION Date/Time: 07/10/2012 8:00 PM Performed by: Raeford Razor Authorized by: Raeford Razor Consent: The procedure was performed in an emergent situation. Indications: respiratory failure, airway protection and hypoxemia Intubation method: direct Patient status: unconscious Preoxygenation: BVM Pretreatment medications: none Laryngoscope size: Mac 3 Tube size: 8.0 mm Tube type: cuffed Number of attempts: 1 Cords visualized: yes Post-procedure assessment: chest rise and ETCO2 monitor Breath  sounds: equal and absent over the epigastrium Cuff inflated: yes ETT to lip: 24 cm Tube secured with: ETT holder Chest x-ray interpreted by me. Patient tolerance: Patient tolerated the procedure well with no immediate complications.   CRITICAL CARE Performed by: Raeford Razor   Total critical care time: 35 minutes  Critical care time was exclusive of separately billable  procedures and treating other patients.  Critical care was necessary to treat or prevent imminent or life-threatening deterioration.  Critical care was time spent personally by me on the following activities: development of treatment plan with patient and/or surrogate as well as nursing, discussions with consultants, evaluation of patient's response to treatment, examination of patient, obtaining history from patient or surrogate, ordering and performing treatments and interventions, ordering and review of laboratory studies, ordering and review of radiographic studies, pulse oximetry and re-evaluation of patient's condition.    (including critical care time)  Labs Reviewed  COMPREHENSIVE METABOLIC PANEL - Abnormal; Notable for the following:    Total Bilirubin 1.3 (*)     All other components within normal limits  GLUCOSE, CAPILLARY - Abnormal; Notable for the following:    Glucose-Capillary 106 (*)     All other components within normal limits  BASIC METABOLIC PANEL - Abnormal; Notable for the following:    Potassium 3.2 (*)     Glucose, Bld 131 (*)     All other components within normal limits  CBC - Abnormal; Notable for the following:    WBC 10.6 (*)     All other components within normal limits  CBC  ETHANOL  LACTIC ACID, PLASMA  TROPONIN I  MAGNESIUM  PHOSPHORUS  URINE RAPID DRUG SCREEN (HOSP PERFORMED)  BLOOD GAS, ARTERIAL  URINALYSIS, ROUTINE W REFLEX MICROSCOPIC   Dg Chest 2 View  07/10/2012  *RADIOLOGY REPORT*  Clinical Data: Dysphagia  CHEST - 2 VIEW  Comparison: 05/14/2006  Findings: Mild aortic tortuosity.  Heart size upper normal.  No confluent airspace opacity.  No pleural effusion or pneumothorax. Mild multilevel degenerative change.  No acute osseous finding.  IMPRESSION: No radiographic evidence of acute cardiopulmonary process.   Original Report Authenticated By: Jearld Lesch, M.D.    Ct Head Wo Contrast  07/10/2012  *RADIOLOGY REPORT*  Clinical Data:   Altered mental status, dysphagia  CT HEAD WITHOUT CONTRAST CT  SOFT TISSUE NECK WITH CONTRAST  Technique:  Multidetector CT imaging of the head was performed following the standard protocol without intravenous contrast. Multi detector CT imaging of the neck was then performed with intravenous contrast.  Multiplanar CT image reconstructions of the cervical spine were also generated.  IV CONTRAST:  100 ml Omnipaque-300  Comparison:   CT scan of the neck 07/01/2012  CT HEAD  Findings: Ill-defined hypoattenuation beginning in the right mid brain crossing midline to extend into the left pons.  This finding is an region of streak artifact related to the clivus and may be artifactual.  No intracranial hemorrhage, mass lesion, mass effect, hydrocephalus or midline shift.  The globes are intact.  No focal soft tissue abnormality.  Partial opacification of the left and right mastoid air cells.  The visualized paranasal sinuses are well- aerated.  There is trace atherosclerotic calcification in the bilateral cavernous carotid arteries.  No acute calvarial abnormality. Incidentally, on the contrasted images of the neck a right fetal type PCA can be identified.  IMPRESSION:  1.  Ill-defined hypoattenuation in the right mid brain crosses the midline and extends into the left pons.  Finding is in a region of streak artifact and may be artifactual.  However, given the clinical history, an underlying ischemic, or demyelinating process cannot be completely excluded.  Recommend further evaluation with brain MRI with and without contrast. 2.  Partial opacification of the bilateral mastoid air cells.  CT CERVICAL SPINE  Findings: Four vessel thoracic aorta.  The vertebral artery arises directly from the aorta.  No focal vascular abnormality identified. Unremarkable CT appearance of the thyroid gland.  Bilateral jugular chain lymph nodes demonstrates normal hilar morphology are not enlarged by CT criteria.  Unremarkable bilateral carotid  submandibular and sublingual salivary glands.  No prevertebral soft tissue swelling, edema or fluid collection.  No asymmetric soft tissue fullness or enhancement.  There is a nonspecific 7 mm higher right paratracheal lymph node which is not enlarged by CT criteria. The visualized cervical and upper thoracic esophagus is within normal limits.  No vascular sling. Left greater than right atheromatous plaque at the carotid bifurcations without significant narrowing.  The lung apices are clear.  No focal osseous abnormality. Poor dentition with multiple missing teeth.  Peri apical lucency about the right maxillary lateral incisor concerning for periodontal disease.  IMPRESSION:  1.  No abnormality of the soft tissues of the neck the no significant interval change compared to 07/01/2012.  2.  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   Original Report Authenticated By: Malachy Moan, M.D.    Ct Soft Tissue Neck W Contrast  07/10/2012  *RADIOLOGY REPORT*  Clinical Data:  Altered mental status, dysphagia  CT HEAD WITHOUT CONTRAST CT  SOFT TISSUE NECK WITH CONTRAST  Technique:  Multidetector CT imaging of the head was performed following the standard protocol without intravenous contrast. Multi detector CT imaging of the neck was then performed with intravenous contrast.  Multiplanar CT image reconstructions of the cervical spine were also generated.  IV CONTRAST:  100 ml Omnipaque-300  Comparison:   CT scan of the neck 07/01/2012  CT HEAD  Findings: Ill-defined hypoattenuation beginning in the right mid brain crossing midline to extend into the left pons.  This finding is an region of streak artifact related to the clivus and may be artifactual.  No intracranial hemorrhage, mass lesion, mass effect, hydrocephalus or midline shift.  The globes are intact.  No focal soft tissue abnormality.  Partial opacification of the left and right mastoid air cells.  The visualized paranasal  sinuses are well- aerated.  There is trace atherosclerotic calcification in the bilateral cavernous carotid arteries.  No acute calvarial abnormality. Incidentally, on the contrasted images of the neck a right fetal type PCA can be identified.  IMPRESSION:  1.  Ill-defined hypoattenuation in the right mid brain crosses the midline and extends into the left pons.  Finding is in a region of streak artifact and may be artifactual.  However, given the clinical history, an underlying ischemic, or demyelinating process cannot be completely excluded.  Recommend further evaluation with brain MRI with and without contrast. 2.  Partial opacification of the bilateral mastoid air cells.  CT CERVICAL SPINE  Findings: Four vessel thoracic aorta.  The vertebral artery arises directly from the aorta.  No focal vascular abnormality identified. Unremarkable CT appearance of the thyroid gland.  Bilateral jugular chain lymph nodes demonstrates normal hilar morphology are not enlarged by CT criteria.  Unremarkable bilateral carotid submandibular and sublingual salivary glands.  No prevertebral soft tissue swelling, edema or fluid collection.  No asymmetric soft tissue fullness  or enhancement.  There is a nonspecific 7 mm higher right paratracheal lymph node which is not enlarged by CT criteria. The visualized cervical and upper thoracic esophagus is within normal limits.  No vascular sling. Left greater than right atheromatous plaque at the carotid bifurcations without significant narrowing.  The lung apices are clear.  No focal osseous abnormality. Poor dentition with multiple missing teeth.  Peri apical lucency about the right maxillary lateral incisor concerning for periodontal disease.  IMPRESSION:  1.  No abnormality of the soft tissues of the neck the no significant interval change compared to 07/01/2012.  2.  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   Original Report Authenticated By: Malachy Moan, M.D.    EKG:  Rhythm: normal sinus Rate: 99 Axis: Left Intervals/Conduction: LAFB. Nonspecific intraventricular delay ST segments: NS ST changes   1. Respiratory failure   2. Cardiac arrest   3. Dysphagia       MDM  57 year old male with dysphasia. This is patient's third ER evaluation for same. Had CT soft tissue neck 10 days ago which was fairly unremarkable. Persistent symptoms. Do not feel that patient is reliable to followup with GI as previously recommended. He is in no acute distress on exam. He appears to be handling his secretions. We'll try to get a barium swallow for further evaluation.   4:34 PM Patient unable to tolerate esophagram. Being sent back to the emergency room. Stating that he can't breathe and refusing to lie back. Something does appear to be bothering patient with him wanting to sit up and holding his chin up/forward with his hands. I cannot find any objective evidence otherwise of any airway compromise. Not completely clear from reviewing previous records what patient's baseline mental status is.  Patient with previous psychiatric evaluations and apparently has history of anxiety. Given difficulty in obtaining good history, will broaden work-up. Will give dose of ativan for possible anxiety component. Will continue to closely observe.   5:45 PM Able to speak with nurse than familiar with pt from prior visit. States that pt's current state is very similar to her observation of him previously.   8:12 PM Called to bedside emergently shortly after pt returned from CT. Pt in hall bed. On my arrival, pt with agonal breathing. O2 sat in 30s. Very bradycardic but palpable pulse. Bagged and moved to resuscitation bay. Pulses not palpable when arrived to resus. CPR initiated. Epi x2. Intubated w/o meds but was making some respiratory effort. On repeat rhythm check pt with ROSC. Sinus tach on monitor. Hypertensive. 12-lead fairly unremarkable. Possible  anaphylactoid reaction to contrast? Pt with no complaints aside from presenting symptoms through out ED stay up until this incident. Initial w/u unremarkable.     Raeford Razor, MD 07/10/12 2100

## 2012-07-10 NOTE — ED Notes (Signed)
Pt. Oxygen level 87%, RN,Dudley made aware.

## 2012-07-10 NOTE — Progress Notes (Signed)
Dr. Juleen China changed FIO2 from 100% to 50% due to increased ABG O2. Dr. Kendrick Fries changed rate from 15 to 20.

## 2012-07-10 NOTE — ED Notes (Signed)
CT notified nurse that patient would not lie flat for CT scans.  Dr. Juleen China notified.

## 2012-07-10 NOTE — ED Notes (Signed)
sys BP 89.   Propofol decreased to 27.78 mcg/kg/min per Nilda Calamity

## 2012-07-10 NOTE — ED Notes (Signed)
Pt suctioned large amount of phlem by terri, charge rn.

## 2012-07-10 NOTE — ED Notes (Signed)
Notified pt's Father-Akiel Tomkinson Sr at [phone]-made aware of pt's condition.  Also contacted pt's ex-wife per request of pt's Father-Lisa Pritchard at [phone] made aware of pt's condition.

## 2012-07-10 NOTE — H&P (Signed)
PULMONARY  / CRITICAL CARE MEDICINE  Name: Victor Durham MRN: [id number] DOB: 05/23/1956    ADMISSION DATE:  07/10/2012 CONSULTATION DATE:  1/30/20143  REFERRING MD :  Juleen China  CHIEF COMPLAINT:  Dysphagia  BRIEF PATIENT DESCRIPTION: 57 y/o homeless male presented to the [facility] ED On 1/30 for the third time in two weeks for evaluation of dysphagia.  He complained of some difficulty breathing and underwent a neck CT.  Shortly after that procedure he developed respiratory and cardiac arrest.  PCCM called for admission.  SIGNIFICANT EVENTS / STUDIES:  1/30 CT head >> hypoattenuation R midbrain extending into L pons, poorly characterized due to streak artifact, recommend MRI brain 1/30 respiratory and cardiac arrest in ED  LINES / TUBES: 1/30 ETT >> 1/30 L [place] CVL >>  CULTURES: 1/30 resp >> 1/30 blood >> 1/30 urine >>  ANTIBIOTICS:   HISTORY OF PRESENT ILLNESS:  57 y/o homeless male presented to the [facility] ED On 1/30 for the third time in two weeks for evaluation of dysphagia.  He complained of some difficulty breathing and underwent a neck CT.  Shortly after that procedure he developed respiratory and cardiac arrest.   He is intubated and unable to provide history, but from what I can gather from the chart he has been in and out of the ED since 1/20 complaining of difficulty swallowing liquids and solids.  In the 1/20 note they state that he has had significant mucus in his throat.  During his first two ED visits (1/20 and 1/30) he was noted to be able to eat and drink without difficulty so he was discharged home.  He was evaluated on 1/30 and d/c'd home but en route to the bus stop EMS was called by a bystander for an unknown reason.  He was brought back to the [facility] ED around 1330 and was evaluated for the same problem.  One note states that he was having difficulty breathing and holding "his chin up/forward with his hands".  Other providers who were familiar with his care stated that this was  similar to prior presentations.  He then went for a CT neck and after returning was found to be breathing agonally and bradycardic.  He lost a pulse and underwent 6 minutes of CPR.  He was intubated easily and was not found to have tongue, laryngeal, or other airway swelling or abnormality.  PCCM was then called for admission.  PAST MEDICAL HISTORY :  Past Medical History  Diagnosis Date  . DVT (deep venous thrombosis)   . Hypertension   . High cholesterol    History reviewed. No pertinent past surgical history. Prior to Admission medications   Medication Sig Start Date End Date Taking? Authorizing Provider  aspirin 81 MG chewable tablet Chew 81 mg by mouth every morning.    Yes Historical Provider, MD   No Known Allergies  FAMILY HISTORY:  History reviewed. No pertinent family history. SOCIAL HISTORY:  reports that he has never smoked. He does not have any smokeless tobacco history on file. He reports that he does not drink alcohol or use illicit drugs.  REVIEW OF SYSTEMS:  Cannot obtain due to intubation  SUBJECTIVE:   VITAL SIGNS: Temp:  [96.2 F (35.7 C)-98.5 F (36.9 C)] 97.6 F (36.4 C) (01/30 2203) Pulse Rate:  [90-110] 93  (01/30 2203) Resp:  [15-25] 22  (01/30 2203) BP: (90-189)/(49-126) 96/55 mmHg (01/30 2200) SpO2:  [94 %-100 %] 100 % (01/30 2203) FiO2 (%):  [100 %]  100 % (01/30 2000) Weight:  [110 kg (242 lb 8.1 oz)] 110 kg (242 lb 8.1 oz) (01/30 1900) HEMODYNAMICS:   VENTILATOR SETTINGS: Vent Mode:  [-] PRVC FiO2 (%):  [100 %] 100 % Set Rate:  [15 bmp] 15 bmp Vt Set:  [620 mL] 620 mL PEEP:  [5 cmH20] 5 cmH20 Plateau Pressure:  [15 cmH20] 15 cmH20 INTAKE / OUTPUT: Intake/Output      01/30 0701 - 01/31 0700   I.V. (mL/kg) 1000 (9.1)   Total Intake(mL/kg) 1000 (9.1)   Stool 550   Total Output 550   Net +450         PHYSICAL EXAMINATION: Gen: sedated on vent, no acute distress HEENT: NCAT, PERRL, EOMi, ETT in place, thick, copious oral  secretions PULM: Crackles bilaterally CV: RRR, no mgr, no JVD AB: BS+, soft, nontender, no hsm Ext: warm, pitting edema in feet and chronic lymphedema changes bilaterally, no clubbing, no cyanosis Derm: lots of dry skin, feet lymphedema as noted above Neuro: sedated on vent, does not follow commands on propofol but withdraws to pain bilaterally and reaches for tube intermittently   LABS:  Lab 07/10/12 2011 07/10/12 1645  HGB 14.9 14.8  WBC 10.6* 8.9  PLT 159 170  NA 138 139  K 3.2* 3.7  CL 100 101  CO2 22 23  GLUCOSE 131* 99  BUN 19 21  CREATININE 0.66 0.60  CALCIUM 8.6 9.0  MG 2.3 --  PHOS 3.6 --  AST -- 28  ALT -- 15  ALKPHOS -- 59  BILITOT -- 1.3*  PROT -- 7.9  ALBUMIN -- 3.7  APTT -- --  INR -- --  LATICACIDVEN -- 1.3  TROPONINI <0.30 --  PROCALCITON -- --  PROBNP -- --  O2SATVEN -- --  PHART 7.309* --  PCO2ART 47.5* --  PO2ART 315.0* --    Lab 07/10/12 2001  GLUCAP 106*    1/30 CXR: bilateral upper lobe predominant airspace disease new from study performed earlier on 1/30  1/30 EKG: NSR, LAFB, no ST wave changes  ASSESSMENT / PLAN:  PULMONARY A: 1) Acute respiratory failure in setting of complaints of two weeks of dysphagia and what sounds like difficulty maintaining secretions in upper airway; EDP worried about post IV contrast anaphylaxis but no angioedema noted on video laryngoscopy for intubation so this seems less likely.  CT neck without swelling or lesion to explain the decline.  Consider esophageal obstruction vs bulbar dysphagia (pseudonuclear palsy, myasthenia, vs other neuromuscular process). 2) Upper lobe airspace disease likely related to either negative pressure edema in setting of upper airway obstruction vs aspiration P:   -full vent support -plan SBT and WUA morning of 1/31 -check cuff leak prior to extubation -prn bronchodilators -check respiratory and blood cultures  CARDIOVASCULAR A:  1) Cardiac arrest in what sounds like  acute respiratory failure; had bradycardia likely due to profound hypoxemia;  Responded quickly with ~6 min CPR and epi x2.  EKG without arrythmia afterwards.  Favor respiratory cause of arrest P:  -echo 1/31 -tele -serial EKG -serial troponin -ASA  RENAL A:   1) Hypokalemia P:   -repleted  GASTROINTESTINAL A:   1) Dysphagia >> inability to tolerate esophogram on 1/30; no lesion on CT neck P:   -OG in place (per nursing, passed easily) -consult GI 1/31 for possible EGD prior to extubation  -consider neuro work up of dysphagia (MRI, myasthenia labs)  HEMATOLOGIC A:   1) History of DVT, not on anticoagulation P:  -  lovenox for DVT prophylaxis  INFECTIOUS A:   1) Aspiration pneumonitis vs pneumonia P:   -unasyn and levaquin -f/u cultures  ENDOCRINE A:   1) no acute issues P:   -monitor glucose  NEUROLOGIC A:  1)  Dysphagia of uncertain etiology 2)  CT head with R midbrain to L pons hypodensity of undetermined significance P:   -in addition to GI consult/possible EGD, work up for neuromuscular cause (ie. MG) with acetylcholine receptor Ab -MRI brain given lesion on CT head -fentanyl/propofol for sedation  TODAY'S SUMMARY:   I have personally obtained a history, examined the patient, evaluated laboratory and imaging results, formulated the assessment and plan and placed orders. CRITICAL CARE: The patient is critically ill with multiple organ systems failure and requires high complexity decision making for assessment and support, frequent evaluation and titration of therapies, application of advanced monitoring technologies and extensive interpretation of multiple databases. Critical Care Time devoted to patient care services described in this note is 90 minutes.   Fonnie Jarvis Pulmonary and Critical Care Medicine [facility] Pager: [phone]  07/10/2012, 10:57 PM

## 2012-07-11 ENCOUNTER — Inpatient Hospital Stay (HOSPITAL_COMMUNITY): Payer: Medicaid Other

## 2012-07-11 ENCOUNTER — Encounter (HOSPITAL_COMMUNITY)
Admission: EM | Disposition: A | Discharge status: Skilled Nursing Facility | Payer: Self-pay | Source: Home / Self Care | Attending: Pulmonary Disease

## 2012-07-11 ENCOUNTER — Encounter (HOSPITAL_COMMUNITY): Payer: Self-pay | Admitting: *Deleted

## 2012-07-11 DIAGNOSIS — M79609 Pain in unspecified limb: Secondary | ICD-10-CM

## 2012-07-11 DIAGNOSIS — I517 Cardiomegaly: Secondary | ICD-10-CM

## 2012-07-11 DIAGNOSIS — I10 Essential (primary) hypertension: Secondary | ICD-10-CM

## 2012-07-11 HISTORY — PX: ESOPHAGOGASTRODUODENOSCOPY: SHX5428

## 2012-07-11 LAB — CBC
HCT: 35.2 % — ABNORMAL LOW (ref 39.0–52.0)
Hemoglobin: 11.8 g/dL — ABNORMAL LOW (ref 13.0–17.0)
MCH: 30.3 pg (ref 26.0–34.0)
MCV: 90.5 fL (ref 78.0–100.0)
RBC: 3.89 MIL/uL — ABNORMAL LOW (ref 4.22–5.81)

## 2012-07-11 LAB — GLUCOSE, CAPILLARY

## 2012-07-11 LAB — BLOOD GAS, ARTERIAL
Drawn by: 11249
MECHVT: 620 mL
O2 Saturation: 99.6 %
PEEP: 10 cmH2O
RATE: 20 resp/min
pCO2 arterial: 28.8 mmHg — ABNORMAL LOW (ref 35.0–45.0)
pO2, Arterial: 152 mmHg — ABNORMAL HIGH (ref 80.0–100.0)

## 2012-07-11 LAB — BASIC METABOLIC PANEL
BUN: 13 mg/dL (ref 6–23)
CO2: 21 mEq/L (ref 19–32)
Calcium: 7.9 mg/dL — ABNORMAL LOW (ref 8.4–10.5)
Creatinine, Ser: 0.58 mg/dL (ref 0.50–1.35)
Glucose, Bld: 94 mg/dL (ref 70–99)

## 2012-07-11 LAB — TROPONIN I
Troponin I: 0.44 ng/mL (ref <0.30)
Troponin I: 0.49 ng/mL (ref <0.30)

## 2012-07-11 SURGERY — EGD (ESOPHAGOGASTRODUODENOSCOPY)
Anesthesia: Moderate Sedation

## 2012-07-11 MED ORDER — MIDAZOLAM HCL 5 MG/ML IJ SOLN
2.0000 mg | INTRAMUSCULAR | Status: DC | PRN
  Administered 2012-07-13: 4 mg via INTRAVENOUS
  Filled 2012-07-11: qty 1

## 2012-07-11 MED ORDER — FENTANYL CITRATE 0.05 MG/ML IJ SOLN
INTRAMUSCULAR | Status: AC
  Filled 2012-07-11: qty 2

## 2012-07-11 MED ORDER — SODIUM CHLORIDE 0.9 % IV SOLN
8.0000 mg/h | INTRAVENOUS | Status: DC
  Administered 2012-07-11 – 2012-07-12 (×2): 8 mg/h via INTRAVENOUS
  Filled 2012-07-11 (×4): qty 80

## 2012-07-11 MED ORDER — SODIUM CHLORIDE 0.9 % IV SOLN
80.0000 mg | Freq: Once | INTRAVENOUS | Status: AC
  Administered 2012-07-11: 80 mg via INTRAVENOUS
  Filled 2012-07-11: qty 80

## 2012-07-11 MED ORDER — HEPARIN (PORCINE) IN NACL 100-0.45 UNIT/ML-% IJ SOLN
1500.0000 [IU]/h | INTRAMUSCULAR | Status: DC
  Administered 2012-07-11 – 2012-07-12 (×3): 1500 [IU]/h via INTRAVENOUS
  Filled 2012-07-11 (×5): qty 250

## 2012-07-11 MED ORDER — CHLORHEXIDINE GLUCONATE 0.12 % MT SOLN
15.0000 mL | Freq: Two times a day (BID) | OROMUCOSAL | Status: DC
  Administered 2012-07-11 – 2012-08-06 (×53): 15 mL via OROMUCOSAL
  Filled 2012-07-11 (×55): qty 15

## 2012-07-11 MED ORDER — FENTANYL CITRATE 0.05 MG/ML IJ SOLN
50.0000 ug | INTRAMUSCULAR | Status: DC | PRN
  Administered 2012-07-13 – 2012-07-15 (×6): 100 ug via INTRAVENOUS
  Filled 2012-07-11 (×6): qty 2

## 2012-07-11 MED ORDER — BIOTENE DRY MOUTH MT LIQD
15.0000 mL | Freq: Four times a day (QID) | OROMUCOSAL | Status: DC
  Administered 2012-07-11 – 2012-08-06 (×96): 15 mL via OROMUCOSAL

## 2012-07-11 MED ORDER — SODIUM CHLORIDE 0.9 % IV BOLUS (SEPSIS)
1000.0000 mL | Freq: Once | INTRAVENOUS | Status: AC
  Administered 2012-07-11: 1000 mL via INTRAVENOUS

## 2012-07-11 MED ORDER — SODIUM CHLORIDE 0.9 % IV SOLN
INTRAVENOUS | Status: DC
  Administered 2012-07-14: 20 mL via INTRAVENOUS

## 2012-07-11 MED ORDER — GADOBENATE DIMEGLUMINE 529 MG/ML IV SOLN
18.0000 mL | Freq: Once | INTRAVENOUS | Status: AC | PRN
  Administered 2012-07-11: 18 mL via INTRAVENOUS

## 2012-07-11 MED ORDER — HEPARIN BOLUS VIA INFUSION
4000.0000 [IU] | Freq: Once | INTRAVENOUS | Status: AC
  Administered 2012-07-11: 4000 [IU] via INTRAVENOUS
  Filled 2012-07-11: qty 4000

## 2012-07-11 MED ORDER — SODIUM CHLORIDE 0.9 % IV SOLN: 250.0000 mL | INTRAVENOUS | Status: DC | PRN

## 2012-07-11 MED ORDER — MIDAZOLAM HCL 10 MG/2ML IJ SOLN
INTRAMUSCULAR | Status: AC
  Filled 2012-07-11: qty 2

## 2012-07-11 NOTE — Progress Notes (Signed)
Name: Victor Durham MRN: [id number] DOB: 1955-12-17  ELECTRONIC ICU PHYSICIAN NOTE  Problem:  Shock   Intake/Output Summary (Last 24 hours) at 07/11/12 0412 Last data filed at 07/10/12 2231  Gross per 24 hour  Intake   1000 ml  Output    550 ml  Net    450 ml         CVP 7 on peep 10 so likely relatively dry  Intervention:  NS x one liter and increase to 199 cc/h  Sandrea Hughs 07/11/2012, 4:12 AM

## 2012-07-11 NOTE — Progress Notes (Signed)
Patient ID: Victor Durham, male   DOB: 07-20-1955, 57 y.o.   MRN: [id number] [facility] Progress Note  Victor Durham 57 y.o. 1956/02/24   Subjective: Called to perform EGD due to history of dysphagia and inability to tolerate recent esophagram. Patient intubated and sedated and no history of dysphagia known.  Objective: Vital signs in last 24 hours: Filed Vitals:   07/11/12 1410  BP: 110/59  Pulse: 99  Temp: 99.5  Resp: 20    Physical Exam: Gen: sedated, intubated, well-nourished CV: RRR Chest: Coarse breath sounds Abd: soft, NT, ND, +BS  Lab Results:  Operating Room Services 07/11/12 0630 07/10/12 2011  NA 138 138  K 3.8 3.2*  CL 106 100  CO2 21 22  GLUCOSE 94 131*  BUN 13 19  CREATININE 0.58 0.66  CALCIUM 7.9* 8.6  MG -- 2.3  PHOS -- 3.6    Basename 07/10/12 1645  AST 28  ALT 15  ALKPHOS 59  BILITOT 1.3*  PROT 7.9  ALBUMIN 3.7    Basename 07/11/12 0630 07/10/12 2011  WBC 6.6 10.6*  NEUTROABS -- --  HGB 11.8* 14.9  HCT 35.2* 44.8  MCV 90.5 92.6  PLT 116* 159    Basename 07/10/12 2310  LABPROT 16.1*  INR 1.32      Assessment/Plan: 57 yo s/p cardiac arrest on ventilator with history of dysphagia. Likely has a CNS or neuromuscular source of dysphagia with head CT findings. EGD to be done to look for esophageal obstructive lesions prior to planned extubation per Dr. Kavin Leech request.   Victor Durham C. 07/11/2012, 2:21 PM

## 2012-07-11 NOTE — Progress Notes (Signed)
*  Preliminary Results* Bilateral lower extremity venous duplex completed. The right lower extremity has no obvious evidence of deep vein thrombosis. There is evidence of a small right Baker's cyst. The left lower extremity is positive for deep vein thrombosis involving the left femoral, popliteal, and posterior tibial veins. There is also evidence of superficial vein thrombosis involving the left greater saphenous vein in the calf. No evidence of a left Baker's cyst. Preliminary results discussed with Dorothyann Gibbs, RN.  07/11/2012 3:05 PM Gertie Fey, RDMS, RDCS

## 2012-07-11 NOTE — Significant Event (Signed)
Spoke w/ father  Victor Winton [name]  [phone], did not have a lot of history on recent health. Pt has had long standing trouble with mental disease. He is either divorced or estranged from his wife over the last 2 years. Father thinks he is homeless.  A back up contact for father is: Victor Durham: [phone]  Of note the patient's dad does have h/o MG.   Anders Simmonds ACNP-BC [facility] Pulmonary/Critical Care Pager # [phone] OR # [phone] if no answer

## 2012-07-11 NOTE — Clinical Social Work Note (Signed)
CSW reviewed chart and discussed case with Anders Simmonds. CSW aware Pt has homeless issues and lack of resources. Pt has h/o depression and followed at [facility] in the past. Pt currently on the vent. CSW will follow for assistance.   Doreen Salvage, LCSW ICU/Stepdown Clinical Social Worker [facility] Cell [phone] Hours 8am-1200pm M-F

## 2012-07-11 NOTE — Progress Notes (Signed)
ANTICOAGULATION CONSULT NOTE - Initial Consult  Pharmacy Consult for IV heparin Indication: LLE DVT  No Known Allergies  Patient Measurements: Height: 6' (182.9 cm) Weight: 195 lb 8.8 oz (88.7 kg) IBW/kg (Calculated) : 77.6  Heparin Dosing Weight: 88.7kg  Vital Signs: Temp: 99.5 F (37.5 C) (01/31 0400) Temp src: Core (Comment) (01/31 0400) BP: 110/59 mmHg (01/31 1410) Pulse Rate: 99  (01/31 1410)  Labs:  Basename 07/11/12 0630 07/11/12 0158 07/10/12 2310 07/10/12 2011 07/10/12 1645  HGB 11.8* -- -- 14.9 --  HCT 35.2* -- -- 44.8 44.0  PLT 116* -- -- 159 170  APTT -- -- 37 -- --  LABPROT -- -- 16.1* -- --  INR -- -- 1.32 -- --  HEPARINUNFRC -- -- -- -- --  CREATININE 0.58 -- -- 0.66 0.60  CKTOTAL -- -- -- -- --  CKMB -- -- -- -- --  TROPONINI 0.49* 0.44* -- <0.30 --    Estimated Creatinine Clearance: 113.2 ml/min (by C-G formula based on Cr of 0.58).   Medical History: Past Medical History  Diagnosis Date  . DVT (deep venous thrombosis)   . Hypertension   . High cholesterol     Assessment: 56 yoM to start on IV heparin for LLE DVT.  Third admission in 2 weeks for dysphagia, developed respiratory and cardiac arrest in ED 1/30.  Has hx DVT, not on anticoagulation PTA.    Baseline labs:  APPT: 37, PT/INR: 16.1/1.32  CBC: Hgb and plts both trending down.  Hgb 14.9-->11.8, Plts159-->116.   Renal fxn: SCr ok, CrCl >100  Received LMWH 40mg  SQ @ 1107 (now d/c'd)  Noted elevated troponins x2   Heparin dosing weight: 88.7kg  No hx of bleeding/stroke noted   Goal of Therapy:  Heparin level 0.3-0.7 units/ml Monitor platelets by anticoagulation protocol: Yes   Plan:  1.  Heparin bolus 4000 units x 1 2.  Heparin infusion at 1500 units/hr (=15 ml/hr) 3.  Heparin level in 6 hours 4.  Daily heparin levels (starting 2/2)/CBC  Asuzena Weis E 07/11/2012,3:59 PM

## 2012-07-11 NOTE — Progress Notes (Signed)
CARE MANAGEMENT NOTE 07/11/2012  Patient:  Durham, Victor   Account Number:  [id number]  Date Initiated:  07/11/2012  Documentation initiated by:  Shaya Altamura  Subjective/Objective Assessment:   cardiac arrest and cpr x64mins, intubated and on vent.     Action/Plan:   homeless   Anticipated DC Date:  07/14/2012   Anticipated DC Plan:  HOME/SELF CARE  In-house referral  Clinical Social Worker         Choice offered to / List presented to:             Status of service:  In process, will continue to follow Medicare Important Message given?   (If response is "NO", the following Medicare IM given date fields will be blank) Date Medicare IM given:   Date Additional Medicare IM given:    Discharge Disposition:    Per UR Regulation:  Reviewed for med. necessity/level of care/duration of stay  If discussed at Long Length of Stay Meetings, dates discussed:    Comments:  [id number]/Nelda Luckey Earlene Plater, RN, BSN, CCM:  CHART REVIEWED AND UPDATED.  Next chart review due on [id number]. NO DISCHARGE NEEDS PRESENT AT THIS TIME. CASE MANAGEMENT [phone]

## 2012-07-11 NOTE — ED Notes (Signed)
Pt's Father at bedside-update given-aware of pt's room in ICU-pt transported in stable condition to ICU with monitor in place-remains ventilated.

## 2012-07-11 NOTE — Progress Notes (Signed)
Advanced ET tube 2 cm per Dr. Juleen China due to X-Shaneece Stockburger.

## 2012-07-11 NOTE — Progress Notes (Signed)
Name: SONG GARRIS MRN: [id number] DOB: [birth year]  ELECTRONIC ICU PHYSICIAN NOTE  Problem:  Still  Hypotensive   Intake/Output Summary (Last 24 hours) at 07/11/12 0600 Last data filed at 07/11/12 0427  Gross per 24 hour  Intake   2340 ml  Output   1100 ml  Net   1240 ml     CVP:  [6 mmHg-7 mmHg] 6 mmHg   Assessment:  Still relatively hypovolemic   Intervention:   1000 cc NS IV  Sandrea Hughs 07/11/2012, 6:00 AM

## 2012-07-11 NOTE — Op Note (Signed)
Procedure: EGD (bedside) - technical difficulties prevented photodocumentation  Indication: Dysphagia  Meds: see hospital chart (Propofol sedation infusion); patient on ventilator  Findings: Oropharynx clear. Esophagus intubated without difficulty and esophagus normal in its proximal and mid-portion. In the distal esophagus there was mild erythema consistent with reflux esophagitis. No stricture or ring was noted. Endoscope was advanced into the stomach where there was segment erythema in the greater curvature of the stomach and fundus consistent with gastritis. Distal stomach was normal in appearance. Duodenal bulb and 2nd part of the duodenum were normal. Retroflexion in the stomach revealed a focal red spot at the GEJ likely due to NG trauma/suction. NG tube was left in place with the tip noted in the mid-stomach.  Impression:  1. Mild erosive esophagitis   2. Gastritis   3. No esophageal obstructive lesions noted  Recs: Protonix infusion; Supportive care; Call us back if needed

## 2012-07-11 NOTE — Progress Notes (Signed)
*  PRELIMINARY RESULTS* Echocardiogram 2D Echocardiogram has been performed.  Jeryl Columbia 07/11/2012, 10:18 AM

## 2012-07-11 NOTE — Progress Notes (Signed)
PCCM Interval Progress Note  Pt w hx known DVT, found to have LLE clot burden on doppler US 1/31. The EGD was reassuring > gastritis and esophagitis but no mass or bleeding source. Will start heparin gtt 1/31.   Levy Pupa, MD, PhD 07/11/2012, 3:20 PM [facility] [phone] or if no answer [phone]

## 2012-07-11 NOTE — H&P (View-Only) (Signed)
PULMONARY  / CRITICAL CARE MEDICINE  Name: Victor Durham MRN: [id number] DOB: 04/07/1956    ADMISSION DATE:  07/10/2012 CONSULTATION DATE:  1/30/20143  REFERRING MD :  Kohut  CHIEF COMPLAINT:  Dysphagia  BRIEF PATIENT DESCRIPTION: 56 y/o homeless male presented to the [facility] ED On 1/30 for the third time in two weeks for evaluation of dysphagia.  He complained of some difficulty breathing and underwent a neck CT.  Shortly after that procedure he developed respiratory and cardiac arrest.  PCCM called for admission.  SIGNIFICANT EVENTS / STUDIES:  1/30 CT head >> hypoattenuation R midbrain extending into L pons, poorly characterized due to streak artifact, recommend MRI brain 1/30 respiratory and cardiac arrest in ED  LINES / TUBES: 1/30 ETT >> 1/30 L [place] CVL >>  CULTURES: 1/30 resp >> 1/30 blood >> 1/30 urine >>  ANTIBIOTICS: unasyn 1/30>>> levaquin 1/30>>> SUBJECTIVE:  Appears comfortable.   VITAL SIGNS: Temp:  [96.2 F (35.7 C)-99.7 F (37.6 C)] 99.5 F (37.5 C) (01/31 0400) Pulse Rate:  [73-110] 78  (01/31 0730) Resp:  [15-25] 20  (01/31 0730) BP: (77-189)/(49-126) 93/56 mmHg (01/31 0730) SpO2:  [94 %-100 %] 100 % (01/31 0730) FiO2 (%):  [50 %-100 %] 50 % (01/31 0516) Weight:  [88.7 kg (195 lb 8.8 oz)-110 kg (242 lb 8.1 oz)] 88.7 kg (195 lb 8.8 oz) (01/31 0045) HEMODYNAMICS: CVP:  [6 mmHg-7 mmHg] 6 mmHg VENTILATOR SETTINGS: Vent Mode:  [-] PRVC FiO2 (%):  [50 %-100 %] 50 % Set Rate:  [15 bmp-20 bmp] 20 bmp Vt Set:  [620 mL] 620 mL PEEP:  [5 cmH20-10 cmH20] 10 cmH20 Plateau Pressure:  [15 cmH20-23 cmH20] 21 cmH20 INTAKE / OUTPUT: Intake/Output      01/30 0701 - 01/31 0700 01/31 0701 - 02/01 0700   I.V. (mL/kg) 1284.2 (14.5)    Other 450    IV Piggyback 2400    Total Intake(mL/kg) 4134.2 (46.6)    Urine (mL/kg/hr) 550 (0.3)    Stool 550    Total Output 1100    Net +3034.2           PHYSICAL EXAMINATION: Gen: sedated on vent, no acute  distress HEENT: NCAT, PERRL, EOMi, ETT in place, thick, copious oral secretions PULM: scatteered rhonchi  CV: RRR, no mgr, no JVD AB: BS+, soft, nontender, no hsm Ext: warm, pitting edema in feet and chronic lymphedema changes bilaterally, no clubbing, no cyanosis Derm: lots of dry skin, feet lymphedema as noted above Neuro: sedated on vent, does not follow commands on propofol but withdraws to pain bilaterally and reaches for tube intermittently   LABS:  Lab 07/11/12 0630 07/11/12 0452 07/11/12 0158 07/10/12 2342 07/10/12 2310 07/10/12 2011 07/10/12 1645 07/10/12 0025  HGB 11.8* -- -- -- -- 14.9 14.8 --  WBC 6.6 -- -- -- -- 10.6* 8.9 --  PLT 116* -- -- -- -- 159 170 --  NA 138 -- -- -- -- 138 139 --  K 3.8 -- -- -- -- 3.2* -- --  CL 106 -- -- -- -- 100 101 --  CO2 21 -- -- -- -- 22 23 --  GLUCOSE 94 -- -- -- -- 131* 99 --  BUN 13 -- -- -- -- 19 21 --  CREATININE 0.58 -- -- -- -- 0.66 0.60 --  CALCIUM 7.9* -- -- -- -- 8.6 9.0 --  MG -- -- -- -- -- 2.3 -- --  PHOS -- -- -- -- -- 3.6 -- --    AST -- -- -- -- -- -- 28 --  ALT -- -- -- -- -- -- 15 --  ALKPHOS -- -- -- -- -- -- 59 --  BILITOT -- -- -- -- -- -- 1.3* --  PROT -- -- -- -- -- -- 7.9 --  ALBUMIN -- -- -- -- -- -- 3.7 --  APTT -- -- -- -- 37 -- -- --  INR -- -- -- -- 1.32 -- -- --  LATICACIDVEN -- -- -- -- -- -- 1.3 1.3  TROPONINI 0.49* -- 0.44* -- -- <0.30 -- --  PROCALCITON -- -- -- 0.11 -- -- -- --  PROBNP -- -- -- -- -- -- -- --  O2SATVEN -- -- -- -- -- -- -- --  PHART -- 7.436 -- -- -- 7.309* -- --  PCO2ART -- 28.8* -- -- -- 47.5* -- --  PO2ART -- 152.0* -- -- -- 315.0* -- --    Lab 07/11/12 0755 07/10/12 2001  GLUCAP 90 106*    1/30 CXR: bilateral upper lobe predominant airspace disease no sig change from 1/30  1/30 EKG: NSR, LAFB, no ST wave changes  ASSESSMENT / PLAN:  PULMONARY A: 1) Acute respiratory failure in setting of complaints of two weeks of dysphagia and what sounds like difficulty  maintaining secretions in upper airway;   Consider esophageal obstruction vs bulbar dysphagia (pseudonuclear palsy, myasthenia, vs other neuromuscular process). 2) Upper lobe airspace disease likely related to either negative pressure edema in setting of upper airway obstruction vs aspiration P:   -full vent support -start eval for wean after GI eval  -check cuff leak prior to extubation -prn bronchodilators -check respiratory and blood cultures  CARDIOVASCULAR A:  1) Cardiac arrest in what sounds like acute respiratory failure; had bradycardia likely due to profound hypoxemia;  Responded quickly with ~6 min CPR and epi x2.  EKG without arrythmia afterwards.  Favor respiratory cause of arrest. Mild trop bump d/t CPR.  P:  -echo 1/31 -tele -ASA  RENAL A:   1) Hypokalemia P:   -repleted, follow  GASTROINTESTINAL A:   1) Dysphagia >> inability to tolerate esophogram on 1/30; no lesion on CT neck P:   -OG in place (per nursing, passed easily) -consult GI 1/31 for possible EGD prior to extubation  -consider neuro work up of dysphagia (MRI, myasthenia labs)  HEMATOLOGIC A:   1) History of DVT, not on anticoagulation P:  -lovenox for DVT prophylaxis -recheck US lower ext   INFECTIOUS A:   1) Aspiration pneumonitis vs pneumonia P:   -unasyn and levaquin -f/u cultures  ENDOCRINE A:   1) no acute issues P:   -monitor glucose  NEUROLOGIC A:  1)  Dysphagia of uncertain etiology 2)  CT head with R midbrain to L pons hypodensity of undetermined significance P:   -in addition to GI consult/possible EGD, work up for neuromuscular cause (ie. MG) with acetylcholine receptor Ab (pending). Given the fact that father had MG will go ahead and move up neuro eval.  -MRI brain given lesion on CT head -fentanyl/propofol for sedation  TODAY'S SUMMARY:   I have personally obtained a history, examined the patient, evaluated laboratory and imaging results, formulated the assessment  and plan and placed orders. CRITICAL CARE: The patient is critically ill with multiple organ systems failure and requires high complexity decision making for assessment and support, frequent evaluation and titration of therapies, application of advanced monitoring technologies and extensive interpretation of multiple databases. Critical Care Time devoted   to patient care services described in this note is 60 minutes.    Maryella Abood, MD, PhD 07/11/2012, 1:35 PM  Pulmonary and Critical Care [phone] or if no answer [phone]    

## 2012-07-11 NOTE — Progress Notes (Addendum)
PULMONARY  / CRITICAL CARE MEDICINE  Name: Victor Durham MRN: [id number] DOB: [birth year]    ADMISSION DATE:  07/10/2012 CONSULTATION DATE:  1/30/20143  REFERRING MD :  Juleen China  CHIEF COMPLAINT:  Dysphagia  BRIEF PATIENT DESCRIPTION: 57 y/o homeless male presented to the [facility] ED On 1/30 for the third time in two weeks for evaluation of dysphagia.  He complained of some difficulty breathing and underwent a neck CT.  Shortly after that procedure he developed respiratory and cardiac arrest.  PCCM called for admission.  SIGNIFICANT EVENTS / STUDIES:  1/30 CT head >> hypoattenuation R midbrain extending into L pons, poorly characterized due to streak artifact, recommend MRI brain 1/30 respiratory and cardiac arrest in ED  LINES / TUBES: 1/30 ETT >> 1/30 L [place] CVL >>  CULTURES: 1/30 resp >> 1/30 blood >> 1/30 urine >>  ANTIBIOTICS: unasyn 1/30>>> levaquin 1/30>>> SUBJECTIVE:  Appears comfortable.   VITAL SIGNS: Temp:  [96.2 F (35.7 C)-99.7 F (37.6 C)] 99.5 F (37.5 C) (01/31 0400) Pulse Rate:  [73-110] 78  (01/31 0730) Resp:  [15-25] 20  (01/31 0730) BP: (77-189)/(49-126) 93/56 mmHg (01/31 0730) SpO2:  [94 %-100 %] 100 % (01/31 0730) FiO2 (%):  [50 %-100 %] 50 % (01/31 0516) Weight:  [88.7 kg (195 lb 8.8 oz)-110 kg (242 lb 8.1 oz)] 88.7 kg (195 lb 8.8 oz) (01/31 0045) HEMODYNAMICS: CVP:  [6 mmHg-7 mmHg] 6 mmHg VENTILATOR SETTINGS: Vent Mode:  [-] PRVC FiO2 (%):  [50 %-100 %] 50 % Set Rate:  [15 bmp-20 bmp] 20 bmp Vt Set:  [620 mL] 620 mL PEEP:  [5 cmH20-10 cmH20] 10 cmH20 Plateau Pressure:  [15 cmH20-23 cmH20] 21 cmH20 INTAKE / OUTPUT: Intake/Output      01/30 0701 - 01/31 0700 01/31 0701 - 02/01 0700   I.V. (mL/kg) 1284.2 (14.5)    Other 450    IV Piggyback 2400    Total Intake(mL/kg) 4134.2 (46.6)    Urine (mL/kg/hr) 550 (0.3)    Stool 550    Total Output 1100    Net +3034.2           PHYSICAL EXAMINATION: Gen: sedated on vent, no acute  distress HEENT: NCAT, PERRL, EOMi, ETT in place, thick, copious oral secretions PULM: scatteered rhonchi  CV: RRR, no mgr, no JVD AB: BS+, soft, nontender, no hsm Ext: warm, pitting edema in feet and chronic lymphedema changes bilaterally, no clubbing, no cyanosis Derm: lots of dry skin, feet lymphedema as noted above Neuro: sedated on vent, does not follow commands on propofol but withdraws to pain bilaterally and reaches for tube intermittently   LABS:  Lab 07/11/12 0630 07/11/12 0452 07/11/12 0158 07/10/12 2342 07/10/12 2310 07/10/12 2011 07/10/12 1645 07/10/12 0025  HGB 11.8* -- -- -- -- 14.9 14.8 --  WBC 6.6 -- -- -- -- 10.6* 8.9 --  PLT 116* -- -- -- -- 159 170 --  NA 138 -- -- -- -- 138 139 --  K 3.8 -- -- -- -- 3.2* -- --  CL 106 -- -- -- -- 100 101 --  CO2 21 -- -- -- -- 22 23 --  GLUCOSE 94 -- -- -- -- 131* 99 --  BUN 13 -- -- -- -- 19 21 --  CREATININE 0.58 -- -- -- -- 0.66 0.60 --  CALCIUM 7.9* -- -- -- -- 8.6 9.0 --  MG -- -- -- -- -- 2.3 -- --  PHOS -- -- -- -- -- 3.6 -- --  AST -- -- -- -- -- -- 28 --  ALT -- -- -- -- -- -- 15 --  ALKPHOS -- -- -- -- -- -- 59 --  BILITOT -- -- -- -- -- -- 1.3* --  PROT -- -- -- -- -- -- 7.9 --  ALBUMIN -- -- -- -- -- -- 3.7 --  APTT -- -- -- -- 37 -- -- --  INR -- -- -- -- 1.32 -- -- --  LATICACIDVEN -- -- -- -- -- -- 1.3 1.3  TROPONINI 0.49* -- 0.44* -- -- <0.30 -- --  PROCALCITON -- -- -- 0.11 -- -- -- --  PROBNP -- -- -- -- -- -- -- --  O2SATVEN -- -- -- -- -- -- -- --  PHART -- 7.436 -- -- -- 7.309* -- --  PCO2ART -- 28.8* -- -- -- 47.5* -- --  PO2ART -- 152.0* -- -- -- 315.0* -- --    Lab 07/11/12 0755 07/10/12 2001  GLUCAP 90 106*    1/30 CXR: bilateral upper lobe predominant airspace disease no sig change from 1/30  1/30 EKG: NSR, LAFB, no ST wave changes  ASSESSMENT / PLAN:  PULMONARY A: 1) Acute respiratory failure in setting of complaints of two weeks of dysphagia and what sounds like difficulty  maintaining secretions in upper airway;   Consider esophageal obstruction vs bulbar dysphagia (pseudonuclear palsy, myasthenia, vs other neuromuscular process). 2) Upper lobe airspace disease likely related to either negative pressure edema in setting of upper airway obstruction vs aspiration P:   -full vent support -start eval for wean after GI eval  -check cuff leak prior to extubation -prn bronchodilators -check respiratory and blood cultures  CARDIOVASCULAR A:  1) Cardiac arrest in what sounds like acute respiratory failure; had bradycardia likely due to profound hypoxemia;  Responded quickly with ~6 min CPR and epi x2.  EKG without arrythmia afterwards.  Favor respiratory cause of arrest. Mild trop bump d/t CPR.  P:  -echo 1/31 -tele -ASA  RENAL A:   1) Hypokalemia P:   -repleted, follow  GASTROINTESTINAL A:   1) Dysphagia >> inability to tolerate esophogram on 1/30; no lesion on CT neck P:   -OG in place (per nursing, passed easily) -consult GI 1/31 for possible EGD prior to extubation  -consider neuro work up of dysphagia (MRI, myasthenia labs)  HEMATOLOGIC A:   1) History of DVT, not on anticoagulation P:  -lovenox for DVT prophylaxis -recheck [place] lower ext   INFECTIOUS A:   1) Aspiration pneumonitis vs pneumonia P:   -unasyn and levaquin -f/u cultures  ENDOCRINE A:   1) no acute issues P:   -monitor glucose  NEUROLOGIC A:  1)  Dysphagia of uncertain etiology 2)  CT head with R midbrain to L pons hypodensity of undetermined significance P:   -in addition to GI consult/possible EGD, work up for neuromuscular cause (ie. MG) with acetylcholine receptor Ab (pending). Given the fact that father had MG will go ahead and move up neuro eval.  -MRI brain given lesion on CT head -fentanyl/propofol for sedation  TODAY'S SUMMARY:   I have personally obtained a history, examined the patient, evaluated laboratory and imaging results, formulated the assessment  and plan and placed orders. CRITICAL CARE: The patient is critically ill with multiple organ systems failure and requires high complexity decision making for assessment and support, frequent evaluation and titration of therapies, application of advanced monitoring technologies and extensive interpretation of multiple databases. Critical Care Time devoted  to patient care services described in this note is 60 minutes.    Levy Pupa, MD, PhD 07/11/2012, 1:35 PM [facility] [phone] or if no answer [phone]

## 2012-07-11 NOTE — Progress Notes (Signed)
INITIAL NUTRITION ASSESSMENT  DOCUMENTATION CODES Per approved criteria  -Not Applicable   INTERVENTION: 1.  Enteral nutrition; recommend initiation of Promote @ 20 mL/hr.  Advance by 10 mL q 8 hrs to 85 mL/hr goal to provide 2040 kcal, 127g protein, 1711 mL free water.  NUTRITION DIAGNOSIS: Inadequate oral intake related to inability to eat as evidenced by pt intubated, NPO.  Monitor:  1.  Enteral nutrition; initiation with tolerance if pt to remain intubated >48 hrs.  Pt to meet >/=90% estimated needs.  Reason for Assessment: vent  57 y.o. male  Admitting Dx: Acute respiratory failure  ASSESSMENT: Pt admitted with dysphagia.  Developed cardiac and respiratory arrest in the ED.   Patient is currently intubated on ventilator support.  MV: 12.6 L/min Temp:Temp (24hrs), Avg:97.8 F (36.6 C), Min:96.2 F (35.7 C), Max:99.7 F (37.6 C)  Per NP, father with h/o MG.  Father also questions whether pt is homeless.  Pt with h/o depression. Pt may need short-term nutrition support if dysphagia r/t MG.  Pt with moderate-high refeeding risk due to potential wt loss identified through chart review, and social/environmental factors reported by father.    Height: Ht Readings from Last 1 Encounters:  07/11/12 6' (1.829 m)    Weight: Wt Readings from Last 1 Encounters:  07/11/12 195 lb 8.8 oz (88.7 kg)    Ideal Body Weight: 80.9 kg  % Ideal Body Weight: 109%  Wt Readings from Last 10 Encounters:  07/11/12 195 lb 8.8 oz (88.7 kg)  07/11/12 195 lb 8.8 oz (88.7 kg)  06/27/09 242 lb 11.2 oz (110.088 kg)  11/06/07 235 lb 6.4 oz (106.777 kg)  02/19/07 232 lb 4.8 oz (105.371 kg)  08/19/06 228 lb 8 oz (103.647 kg)    Usual Body Weight: 230-240 lbs per chart review 2008-2009  % Usual Body Weight: 84%  BMI:  Body mass index is 26.52 kg/(m^2).  Estimated Nutritional Needs: Kcal: 2124 Protein: 106-132g Fluid: >2.2 L/day or per MD discretion  Skin: mild pitting edema  Diet  Order: NPO  EDUCATION NEEDS: -Education not appropriate at this time   Intake/Output Summary (Last 24 hours) at 07/11/12 1401 Last data filed at 07/11/12 1300  Gross per 24 hour  Intake 4324.2 ml  Output   2090 ml  Net 2234.2 ml    Last BM: PTA  Labs:   Lab 07/11/12 0630 07/10/12 2011 07/10/12 1645  NA 138 138 139  K 3.8 3.2* 3.7  CL 106 100 101  CO2 21 22 23   BUN 13 19 21   CREATININE 0.58 0.66 0.60  CALCIUM 7.9* 8.6 9.0  MG -- 2.3 --  PHOS -- 3.6 --  GLUCOSE 94 131* 99    CBG (last 3)   Basename 07/11/12 0755 07/10/12 2001  GLUCAP 90 106*    Scheduled Meds:   . ampicillin-sulbactam (UNASYN) IV  3 g Intravenous Q6H  . antiseptic oral rinse  15 mL Mouth Rinse QID  . chlorhexidine  15 mL Mouth Rinse BID  . enoxaparin (LOVENOX) injection  40 mg Subcutaneous Q24H  . famotidine (PEPCID) IV  20 mg Intravenous Q12H  . levofloxacin (LEVAQUIN) IV  750 mg Intravenous QHS    Continuous Infusions:   . fentaNYL infusion INTRAVENOUS    . propofol 5 mcg/kg/min (07/11/12 0300)    Past Medical History  Diagnosis Date  . DVT (deep venous thrombosis)   . Hypertension   . High cholesterol     History reviewed. No pertinent past surgical history.  Brynda Greathouse, MS RD LDN Clinical Inpatient Dietitian Pager: [phone] Weekend/After hours pager: [phone]

## 2012-07-11 NOTE — Interval H&P Note (Signed)
History and Physical Interval Note:  07/11/2012 2:11 PM  Victor Durham  has presented today for surgery, with the diagnosis of dysphagia  The various methods of treatment have been discussed with the patient and family. After consideration of risks, benefits and other options for treatment, the patient has consented to  Procedure(s) (LRB) with comments: ESOPHAGOGASTRODUODENOSCOPY (EGD) (N/A) - BEDSIDE as a surgical intervention .  The patient's history has been reviewed, patient examined, no change in status, stable for surgery.  I have reviewed the patient's chart and labs.  Questions were answered to the patient's satisfaction.     [name]

## 2012-07-12 ENCOUNTER — Inpatient Hospital Stay (HOSPITAL_COMMUNITY): Payer: Medicaid Other

## 2012-07-12 ENCOUNTER — Other Ambulatory Visit (HOSPITAL_COMMUNITY): Payer: Self-pay

## 2012-07-12 ENCOUNTER — Encounter (HOSPITAL_COMMUNITY): Payer: Self-pay | Admitting: Radiology

## 2012-07-12 LAB — COMPREHENSIVE METABOLIC PANEL
ALT: 104 U/L — ABNORMAL HIGH (ref 0–53)
AST: 76 U/L — ABNORMAL HIGH (ref 0–37)
Albumin: 2.3 g/dL — ABNORMAL LOW (ref 3.5–5.2)
Alkaline Phosphatase: 41 U/L (ref 39–117)
Calcium: 7.7 mg/dL — ABNORMAL LOW (ref 8.4–10.5)
GFR calc Af Amer: >90 mL/min (ref >90)
Potassium: 3.1 mEq/L — ABNORMAL LOW (ref 3.5–5.1)
Sodium: 138 mEq/L (ref 135–145)
Total Protein: 5.5 g/dL — ABNORMAL LOW (ref 6.0–8.3)

## 2012-07-12 LAB — URINE CULTURE

## 2012-07-12 LAB — CBC
MCH: 29.7 pg (ref 26.0–34.0)
MCHC: 33 g/dL (ref 30.0–36.0)
Platelets: 122 10*3/uL — ABNORMAL LOW (ref 150–400)
RDW: 13.9 % (ref 11.5–15.5)

## 2012-07-12 LAB — HEPARIN LEVEL (UNFRACTIONATED): Heparin Unfractionated: 0.44 IU/mL (ref 0.30–0.70)

## 2012-07-12 LAB — SEDIMENTATION RATE: Sed Rate: 36 mm/hr — ABNORMAL HIGH (ref 0–16)

## 2012-07-12 LAB — INFLUENZA PANEL BY PCR (TYPE A & B)
Influenza A By PCR: NEGATIVE
Influenza B By PCR: NEGATIVE

## 2012-07-12 MED ORDER — POTASSIUM CHLORIDE 20 MEQ/15ML (10%) PO LIQD
40.0000 meq | Freq: Once | ORAL | Status: AC
  Administered 2012-07-12: 40 meq
  Filled 2012-07-12: qty 30

## 2012-07-12 MED ORDER — ACETAMINOPHEN 160 MG/5ML PO SOLN
650.0000 mg | ORAL | Status: DC | PRN
  Administered 2012-07-12: 650 mg
  Filled 2012-07-12 (×2): qty 20.3

## 2012-07-12 MED ORDER — PANTOPRAZOLE SODIUM 40 MG IV SOLR
40.0000 mg | Freq: Two times a day (BID) | INTRAVENOUS | Status: DC
  Administered 2012-07-12 (×2): 40 mg via INTRAVENOUS
  Filled 2012-07-12 (×4): qty 40

## 2012-07-12 MED ORDER — IOHEXOL 300 MG/ML  SOLN
100.0000 mL | Freq: Once | INTRAMUSCULAR | Status: AC | PRN
  Administered 2012-07-12: 100 mL via INTRAVENOUS

## 2012-07-12 NOTE — Progress Notes (Signed)
PULMONARY  / CRITICAL CARE MEDICINE  Name: JOSEMANUEL EAKINS MRN: [id number] DOB: 1956-03-20    ADMISSION DATE:  07/10/2012 CONSULTATION DATE:  1/30/20143  REFERRING MD :  Juleen China  CHIEF COMPLAINT:  Dysphagia  BRIEF PATIENT DESCRIPTION: 57 y/o homeless male presented to the [facility] ED On 1/30 for the third time in two weeks for evaluation of dysphagia.  He complained of some difficulty breathing and underwent a neck CT.  Shortly after that procedure he developed respiratory and cardiac arrest.  PCCM called for admission.  SIGNIFICANT EVENTS / STUDIES:  1/30 CT head >> hypoattenuation R midbrain extending into L pons, poorly characterized due to streak artifact, recommend MRI brain 1/30 respiratory and cardiac arrest in ED 1/31 EGD gastritis and esophagitis but no mass or bleeding source 1/31 LLE clot burden - heparin gtt 1/31 Thick secretions -plugged ETT during MRI 1/31 MRI -Scattered subcortical T2 hyperintensities are greater than expected for age -nonspecific but can be seen in the  setting of chronic microvascular ischemia, a demyelinating process such as multiple sclerosis, vasculitis, complicated migraine headaches, or as the sequelae of a prior infectious or inflammatory process.     LINES / TUBES: 1/30 ETT >> 1/30 L [place] CVL >>  CULTURES: 1/30 resp >> 1/30 blood >> 1/30 urine >>  ANTIBIOTICS: unasyn 1/30>>> levaquin 1/30>>> SUBJECTIVE:  Appears comfortable.  Thick secretions -plugged ETT during MRI afebrile  VITAL SIGNS: Temp:  [99.5 F (37.5 C)-101.2 F (38.4 C)] 99.5 F (37.5 C) (02/01 0800) Pulse Rate:  [73-99] 76  (02/01 0800) Resp:  [15-23] 20  (02/01 0800) BP: (83-124)/(44-71) 103/59 mmHg (02/01 0800) SpO2:  [91 %-99 %] 98 % (02/01 0800) FiO2 (%):  [40 %] 40 % (02/01 0800) Weight:  [89.9 kg (198 lb 3.1 oz)] 89.9 kg (198 lb 3.1 oz) (02/01 0500) HEMODYNAMICS: CVP:  [8 mmHg-9 mmHg] 8 mmHg VENTILATOR SETTINGS: Vent Mode:  [-] PRVC FiO2 (%):  [40 %] 40  % Set Rate:  [20 bmp] 20 bmp Vt Set:  [782 mL] 620 mL PEEP:  [5 cmH20-8 cmH20] 5 cmH20 Plateau Pressure:  [14 cmH20-20 cmH20] 16 cmH20 INTAKE / OUTPUT: Intake/Output      01/31 0701 - 02/01 0700 02/01 0701 - 02/02 0700   I.V. (mL/kg) 1770 (19.7) 160 (1.8)   Other 20    NG/GT 80    IV Piggyback 750    Total Intake(mL/kg) 2620 (29.1) 160 (1.8)   Urine (mL/kg/hr) 1565 (0.7) 50   Stool     Total Output 1565 50   Net +1055 +110          PHYSICAL EXAMINATION: Gen: awake on vent, no acute distress HEENT: NCAT, PERRL, EOMi, ETT in place, thick, copious oral secretions PULM: scatteered rhonchi  CV: RRR, no mgr, no JVD AB: BS+, soft, nontender, no hsm Ext: warm, pitting edema in feet and chronic lymphedema changes bilaterally, no clubbing, no cyanosis Derm: lots of dry skin, feet lymphedema as noted above Neuro: follows commands , power 4/5 BUEs & LEs, ptosis +   LABS:  Lab 07/12/12 0507 07/11/12 0630 07/11/12 0452 07/11/12 0158 07/10/12 2342 07/10/12 2310 07/10/12 2011 07/10/12 1645 07/10/12 0025  HGB 11.6* 11.8* -- -- -- -- 14.9 -- --  WBC 9.5 6.6 -- -- -- -- 10.6* -- --  PLT 122* 116* -- -- -- -- 159 -- --  NA 138 138 -- -- -- -- 138 -- --  K 3.1* 3.8 -- -- -- -- -- -- --  CL  107 106 -- -- -- -- 100 -- --  CO2 20 21 -- -- -- -- 22 -- --  GLUCOSE 99 94 -- -- -- -- 131* -- --  BUN 10 13 -- -- -- -- 19 -- --  CREATININE 0.61 0.58 -- -- -- -- 0.66 -- --  CALCIUM 7.7* 7.9* -- -- -- -- 8.6 -- --  MG -- -- -- -- -- -- 2.3 -- --  PHOS -- -- -- -- -- -- 3.6 -- --  AST 76* -- -- -- -- -- -- 28 --  ALT 104* -- -- -- -- -- -- 15 --  ALKPHOS 41 -- -- -- -- -- -- 59 --  BILITOT 1.2 -- -- -- -- -- -- 1.3* --  PROT 5.5* -- -- -- -- -- -- 7.9 --  ALBUMIN 2.3* -- -- -- -- -- -- 3.7 --  APTT -- -- -- -- -- 37 -- -- --  INR -- -- -- -- -- 1.32 -- -- --  LATICACIDVEN -- -- -- -- -- -- -- 1.3 1.3  TROPONINI -- 0.49* -- 0.44* -- -- <0.30 -- --  PROCALCITON -- -- -- -- 0.11 -- -- -- --   PROBNP -- -- -- -- -- -- -- -- --  O2SATVEN -- -- -- -- -- -- -- -- --  PHART -- -- 7.436 -- -- -- 7.309* -- --  PCO2ART -- -- 28.8* -- -- -- 47.5* -- --  PO2ART -- -- 152.0* -- -- -- 315.0* -- --    Lab 07/11/12 0755 07/10/12 2001  GLUCAP 90 106*    2/1 JYN:WGNFAOZ ASD  1/30 EKG: NSR, LAFB, no ST wave changes  ASSESSMENT / PLAN:  PULMONARY A: 1) Acute respiratory failure in setting of complaints of two weeks of dysphagia and what sounds like difficulty maintaining secretions in upper airway;   Consider esophageal obstruction vs bulbar dysphagia (pseudonuclear palsy, myasthenia, vs other neuromuscular process). 2) Upper lobe airspace disease likely related to either negative pressure edema in setting of upper airway obstruction vs aspiration P:   -SBTs but hold off extubation due to weakness & secretions -daily NIF -prn bronchodilators -check respiratory and blood cultures  CARDIOVASCULAR A:  1) Cardiac arrest in what sounds like acute respiratory failure; had bradycardia likely due to profound hypoxemia;  Responded quickly with ~6 min CPR and epi x2.  EKG without arrythmia afterwards.  Favor respiratory cause of arrest. Mild trop bump d/t CPR.  -echo 1/31 - dialted RA/RV, septal dyssynergy, nml LVEF P: Suspect PE Await CT angio -tele -ASA  RENAL A:   1) Hypokalemia P:   -repleted, follow  GASTROINTESTINAL A:   1) Dysphagia >> inability to tolerate esophogram on 1/30; no lesion on CT neck 1/31 EGD gastritis and esophagitis but no mass or bleeding source P:   -OG in place (per nursing, passed easily) - neuro work up of dysphagia (MRI, myasthenia labs) -PPI bid  HEMATOLOGIC A:   1) History of DVT, not on anticoagulation 1/31 DVT POS left femoral, popliteal, and posterior tibial veins Suspect PE  P:  -lV heparin  INFECTIOUS A:   1) Aspiration pneumonitis vs pneumonia P:   -unasyn and levaquin -f/u cultures  ENDOCRINE A:   1) no acute issues P:    -monitor glucose  NEUROLOGIC A:  1)  Dysphagia of uncertain etiology 2)  CT head with R midbrain to L pons hypodensity of undetermined significance P:   -in addition to GI consult/possible EGD,  work up for neuromuscular cause (ie. MG) with acetylcholine receptor Ab (pending). Given the fact that father had MG will go ahead and move up neuro eval.  -fentanyl/propofol for sedation -CT chest for thymoma  GLOBAL -Ronin Sowash [name] [phone],  back up contact for father is: [name]: [phone]  TODAY'S SUMMARY:   I have personally obtained a history, examined the patient, evaluated laboratory and imaging results, formulated the assessment and plan and placed orders. CRITICAL CARE: The patient is critically ill with multiple organ systems failure and requires high complexity decision making for assessment and support, frequent evaluation and titration of therapies, application of advanced monitoring technologies and extensive interpretation of multiple databases. Critical Care Time devoted to patient care services described in this note is 40 minutes.    Cyril Mourning MD. Tonny Bollman. [facility] Pager [phone] If no response call [phone]   07/12/2012, 9:06 AM

## 2012-07-12 NOTE — Progress Notes (Signed)
RT explained NIF procedure to patient, Pt seemed to acknowledge understanding and was able to generate a NIF of -10, Pt gave what seemed like a good effort X's 3.  RT to monitor and assess as needed.

## 2012-07-12 NOTE — Progress Notes (Signed)
ANTICOAGULATION CONSULT NOTE   Pharmacy Consult for IV heparin Indication: LLE DVT  No Known Allergies  Patient Measurements: Height: 6' (182.9 cm) Weight: 195 lb 8.8 oz (88.7 kg) IBW/kg (Calculated) : 77.6  Heparin Dosing Weight: 88.7kg  Vital Signs: Temp: 101 F (38.3 C) (02/01 0000) Temp src: Axillary (02/01 0000) BP: 109/59 mmHg (02/01 0012) Pulse Rate: 98  (02/01 0012)  Labs:  Basename 07/11/12 2300 07/11/12 0630 07/11/12 0158 07/10/12 2310 07/10/12 2011 07/10/12 1645  HGB -- 11.8* -- -- 14.9 --  HCT -- 35.2* -- -- 44.8 44.0  PLT -- 116* -- -- 159 170  APTT -- -- -- 37 -- --  LABPROT -- -- -- 16.1* -- --  INR -- -- -- 1.32 -- --  HEPARINUNFRC 0.44 -- -- -- -- --  CREATININE -- 0.58 -- -- 0.66 0.60  CKTOTAL -- -- -- -- -- --  CKMB -- -- -- -- -- --  TROPONINI -- 0.49* 0.44* -- <0.30 --    Estimated Creatinine Clearance: 113.2 ml/min (by C-G formula based on Cr of 0.58).   Medical History: Past Medical History  Diagnosis Date  . DVT (deep venous thrombosis)   . Hypertension   . High cholesterol     Assessment: 56 yoM to start on IV heparin for LLE DVT.  Third admission in 2 weeks for dysphagia, developed respiratory and cardiac arrest in ED 1/30.  Has hx DVT, not on anticoagulation PTA.    Baseline labs:  APPT: 37, PT/INR: 16.1/1.32  CBC: Hgb and plts both trending down.  Hgb 14.9-->11.8, Plts159-->116.   Renal fxn: SCr ok, CrCl >100  Noted elevated troponins x2   Heparin dosing weight: 88.7kg  No hx of bleeding/stroke noted  1st Heparin Level = 0.44 (therapeutic)  No complications of therapy noted   Goal of Therapy:  Heparin level 0.3-0.7 units/ml Monitor platelets by anticoagulation protocol: Yes   Plan:  1.  Continue Heparin infusion at 1500 units/hr (=15 ml/hr) 2.  Daily heparin levels & CBC  Percell Lamboy, Joselyn Glassman, PharmD 07/12/2012,12:26 AM

## 2012-07-12 NOTE — Consult Note (Signed)
Reason for Consult:Rule out MG Referring Physician: Zubelevitskiy  CC: Difficulty with swallowing  HPI: Victor Durham is an 57 y.o. male admitted after developing respiratory and cardiac arrest requiring intubation.  Patient had presented to the ED three times in a two week period previous to the arrest with complaints of dysphagia.  Patient had additional complaints of difficulty breathing on the day of admission and had completed a CT of the neck in work up. Patient has remained intubated with difficulty swallowing.  Has been evaluated by GI by EGD-neuromuscular etiology suspected.  CT was performed on the day of admission that showed a possible hypodensity in the right midbrain and left pons.  MRI in follow up of this was only significant for scattered T2 hyperintensities likely representing chronic microvascular ischemia.  .    Past Medical History  Diagnosis Date  . DVT (deep venous thrombosis)   . Hypertension   . High cholesterol     History reviewed. No pertinent past surgical history. Patient intubated  History reviewed. No pertinent family history. Patient intubated  Social History:  reports that he has never smoked. He does not have any smokeless tobacco history on file. He reports that he does not drink alcohol or use illicit drugs.  No Known Allergies  Medications:  I have reviewed the patient's current medications. Prior to Admission:  Prescriptions prior to admission  Medication Sig Dispense Refill  . aspirin 81 MG chewable tablet Chew 81 mg by mouth every morning.        Scheduled:   . ampicillin-sulbactam (UNASYN) IV  3 g Intravenous Q6H  . antiseptic oral rinse  15 mL Mouth Rinse QID  . chlorhexidine  15 mL Mouth Rinse BID  . famotidine (PEPCID) IV  20 mg Intravenous Q12H  . levofloxacin (LEVAQUIN) IV  750 mg Intravenous QHS    ROS: Unable to obtain  Physical Examination: Blood pressure 109/59, pulse 98, temperature 101 F (38.3 C), temperature  source Axillary, resp. rate 23, height 6' (1.829 m), weight 88.7 kg (195 lb 8.8 oz), SpO2 97.00%.  Neurologic Examination Mental Status: Alerts when name called.  Able to follow commands without difficulty.  Nods head in response to questioning.   Cranial Nerves: II: Discs flat bilaterally; Visual fields grossly normal, pupils equal, round, reactive to light and accommodation III,IV, VI: patient unable to open eyes completely-? Bilateral ptosis, extra-ocular motions intact bilaterally V,VII: smile symmetric, facial light touch sensation normal bilaterally VIII: hearing normal bilaterally IX,X: gag reflex not tested XI: bilateral shoulder shrug XII: tongue extension attempted Motor: Able to lift all extremities off the bed with no focality noted.  Edema noted in the lower extremities   Sensory: Responds to stimuli in all extremities Deep Tendon Reflexes: 1+ throughout with absent AJ's bilaterally.   Plantars: Right: equivocal   Left: equivocal Cerebellar: normal finger-to-nose Gait: unable to test CV: pulses palpable throughout   Laboratory Studies:   Basic Metabolic Panel:  Lab 07/11/12 1610 07/10/12 2011 07/10/12 1645  NA 138 138 139  K 3.8 3.2* 3.7  CL 106 100 101  CO2 21 22 23   GLUCOSE 94 131* 99  BUN 13 19 21   CREATININE 0.58 0.66 0.60  CALCIUM 7.9* 8.6 9.0  MG -- 2.3 --  PHOS -- 3.6 --    Liver Function Tests:  Lab 07/10/12 1645  AST 28  ALT 15  ALKPHOS 59  BILITOT 1.3*  PROT 7.9  ALBUMIN 3.7   No results found for this basename: LIPASE:5,AMYLASE:5  in the last 168 hours No results found for this basename: AMMONIA:3 in the last 168 hours  CBC:  Lab 07/11/12 0630 07/10/12 2011 07/10/12 1645  WBC 6.6 10.6* 8.9  NEUTROABS -- -- --  HGB 11.8* 14.9 14.8  HCT 35.2* 44.8 44.0  MCV 90.5 92.6 90.0  PLT 116* 159 170    Cardiac Enzymes:  Lab 07/11/12 0630 07/11/12 0158 07/10/12 2011  CKTOTAL -- -- --  CKMB -- -- --  CKMBINDEX -- -- --  TROPONINI 0.49*  0.44* <0.30    BNP: No components found with this basename: POCBNP:5  CBG:  Lab 07/11/12 0755 07/10/12 2001  GLUCAP 90 106*    Microbiology: Results for orders placed during the hospital encounter of 07/10/12  MRSA PCR SCREENING     Status: Normal   Collection Time   07/11/12  3:35 AM      Component Value Range Status Comment   MRSA by PCR NEGATIVE  NEGATIVE Final     Coagulation Studies:  Basename 07/10/12 2310  LABPROT 16.1*  INR 1.32    Urinalysis:  Lab 07/10/12 2054  COLORURINE AMBER*  LABSPEC >1.046*  PHURINE 5.5  GLUCOSEU NEGATIVE  HGBUR TRACE*  BILIRUBINUR SMALL*  KETONESUR >80*  PROTEINUR 100*  UROBILINOGEN 1.0  NITRITE NEGATIVE  LEUKOCYTESUR NEGATIVE    Lipid Panel:     Component Value Date/Time   CHOL 223* 06/27/2009 2022   TRIG 164* 06/27/2009 2022   HDL 37* 06/27/2009 2022   CHOLHDL 6.0 Ratio 06/27/2009 2022   VLDL 33 06/27/2009 2022   LDLCALC 153* 06/27/2009 2022    HgbA1C:  No results found for this basename: HGBA1C    Urine Drug Screen:     Component Value Date/Time   LABOPIA NONE DETECTED 07/10/2012 2054   COCAINSCRNUR NONE DETECTED 07/10/2012 2054   LABBENZ NONE DETECTED 07/10/2012 2054   AMPHETMU NONE DETECTED 07/10/2012 2054   THCU NONE DETECTED 07/10/2012 2054   LABBARB NONE DETECTED 07/10/2012 2054    Alcohol Level:  Lab 07/10/12 1645  ETH <11    Imaging: Dg Chest 2 View  07/10/2012  *RADIOLOGY REPORT*  Clinical Data: Dysphagia  CHEST - 2 VIEW  Comparison: 05/14/2006  Findings: Mild aortic tortuosity.  Heart size upper normal.  No confluent airspace opacity.  No pleural effusion or pneumothorax. Mild multilevel degenerative change.  No acute osseous finding.  IMPRESSION: No radiographic evidence of acute cardiopulmonary process.   Original Report Authenticated By: Jearld Lesch, M.D.    Ct Head Wo Contrast  07/10/2012  *RADIOLOGY REPORT*  Clinical Data:  Altered mental status, dysphagia  CT HEAD WITHOUT CONTRAST CT  SOFT TISSUE  NECK WITH CONTRAST  Technique:  Multidetector CT imaging of the head was performed following the standard protocol without intravenous contrast. Multi detector CT imaging of the neck was then performed with intravenous contrast.  Multiplanar CT image reconstructions of the cervical spine were also generated.  IV CONTRAST:  100 ml Omnipaque-300  Comparison:   CT scan of the neck 07/01/2012  CT HEAD  Findings: Ill-defined hypoattenuation beginning in the right mid brain crossing midline to extend into the left pons.  This finding is an region of streak artifact related to the clivus and may be artifactual.  No intracranial hemorrhage, mass lesion, mass effect, hydrocephalus or midline shift.  The globes are intact.  No focal soft tissue abnormality.  Partial opacification of the left and right mastoid air cells.  The visualized paranasal sinuses are well- aerated.  There is trace atherosclerotic calcification in the bilateral cavernous carotid arteries.  No acute calvarial abnormality. Incidentally, on the contrasted images of the neck a right fetal type PCA can be identified.  IMPRESSION:  1.  Ill-defined hypoattenuation in the right mid brain crosses the midline and extends into the left pons.  Finding is in a region of streak artifact and may be artifactual.  However, given the clinical history, an underlying ischemic, or demyelinating process cannot be completely excluded.  Recommend further evaluation with brain MRI with and without contrast. 2.  Partial opacification of the bilateral mastoid air cells.  CT CERVICAL SPINE  Findings: Four vessel thoracic aorta.  The vertebral artery arises directly from the aorta.  No focal vascular abnormality identified. Unremarkable CT appearance of the thyroid gland.  Bilateral jugular chain lymph nodes demonstrates normal hilar morphology are not enlarged by CT criteria.  Unremarkable bilateral carotid submandibular and sublingual salivary glands.  No prevertebral soft tissue  swelling, edema or fluid collection.  No asymmetric soft tissue fullness or enhancement.  There is a nonspecific 7 mm higher right paratracheal lymph node which is not enlarged by CT criteria. The visualized cervical and upper thoracic esophagus is within normal limits.  No vascular sling. Left greater than right atheromatous plaque at the carotid bifurcations without significant narrowing.  The lung apices are clear.  No focal osseous abnormality. Poor dentition with multiple missing teeth.  Peri apical lucency about the right maxillary lateral incisor concerning for periodontal disease.  IMPRESSION:  1.  No abnormality of the soft tissues of the neck the no significant interval change compared to 07/01/2012.  2.  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   Original Report Authenticated By: Malachy Moan, M.D.    Ct Soft Tissue Neck W Contrast  07/10/2012  *RADIOLOGY REPORT*  Clinical Data:  Altered mental status, dysphagia  CT HEAD WITHOUT CONTRAST CT  SOFT TISSUE NECK WITH CONTRAST  Technique:  Multidetector CT imaging of the head was performed following the standard protocol without intravenous contrast. Multi detector CT imaging of the neck was then performed with intravenous contrast.  Multiplanar CT image reconstructions of the cervical spine were also generated.  IV CONTRAST:  100 ml Omnipaque-300  Comparison:   CT scan of the neck 07/01/2012  CT HEAD  Findings: Ill-defined hypoattenuation beginning in the right mid brain crossing midline to extend into the left pons.  This finding is an region of streak artifact related to the clivus and may be artifactual.  No intracranial hemorrhage, mass lesion, mass effect, hydrocephalus or midline shift.  The globes are intact.  No focal soft tissue abnormality.  Partial opacification of the left and right mastoid air cells.  The visualized paranasal sinuses are well- aerated.  There is trace atherosclerotic calcification in the  bilateral cavernous carotid arteries.  No acute calvarial abnormality. Incidentally, on the contrasted images of the neck a right fetal type PCA can be identified.  IMPRESSION:  1.  Ill-defined hypoattenuation in the right mid brain crosses the midline and extends into the left pons.  Finding is in a region of streak artifact and may be artifactual.  However, given the clinical history, an underlying ischemic, or demyelinating process cannot be completely excluded.  Recommend further evaluation with brain MRI with and without contrast. 2.  Partial opacification of the bilateral mastoid air cells.  CT CERVICAL SPINE  Findings: Four vessel thoracic aorta.  The vertebral artery arises directly from the aorta.  No focal vascular abnormality identified. Unremarkable CT appearance of the thyroid gland.  Bilateral jugular chain lymph nodes demonstrates normal hilar morphology are not enlarged by CT criteria.  Unremarkable bilateral carotid submandibular and sublingual salivary glands.  No prevertebral soft tissue swelling, edema or fluid collection.  No asymmetric soft tissue fullness or enhancement.  There is a nonspecific 7 mm higher right paratracheal lymph node which is not enlarged by CT criteria. The visualized cervical and upper thoracic esophagus is within normal limits.  No vascular sling. Left greater than right atheromatous plaque at the carotid bifurcations without significant narrowing.  The lung apices are clear.  No focal osseous abnormality. Poor dentition with multiple missing teeth.  Peri apical lucency about the right maxillary lateral incisor concerning for periodontal disease.  IMPRESSION:  1.  No abnormality of the soft tissues of the neck the no significant interval change compared to 07/01/2012.  2.  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   Original Report Authenticated By: Malachy Moan, M.D.    Mr Laqueta Jean Wo Contrast  07/11/2012  *RADIOLOGY REPORT*   Clinical Data: Evaluate for CVA.  Possible left pons or right mid brain lesion.  Dysphasia and cardiac arrest.  6 minutes of CPR.  MRI HEAD WITHOUT AND WITH CONTRAST  Technique:  Multiplanar, multiecho pulse sequences of the brain and surrounding structures were obtained according to standard protocol without and with intravenous contrast  Contrast: 18mL MULTIHANCE GADOBENATE DIMEGLUMINE 529 MG/ML IV SOLN  Comparison: CT head without contrast 07/10/2012.  Findings: The diffusion weighted images demonstrate no evidence for acute or subacute infarct.  There is no hemorrhage or mass lesion. Scattered subcortical T2 hyperintensities are greater than expected for age.  The ventricles are normal size.  No significant extra- axial fluid collections present.  Flow is present major intracranial arteries.  The globes and orbits are intact.  The paranasal sinuses are clear.  Fluid is present in the mastoid air cells bilaterally.  There is also fluid in the nasopharynx.  The postcontrast images demonstrate no pathologic enhancement.  IMPRESSION:  1.  No acute intracranial abnormality. 2.  Scattered subcortical T2 hyperintensities are greater than expected for age. The finding is nonspecific but can be seen in the setting of chronic microvascular ischemia, a demyelinating process such as multiple sclerosis, vasculitis, complicated migraine headaches, or as the sequelae of a prior infectious or inflammatory process. 3.  Bilateral mastoid effusions are likely secondary to intubation.   Original Report Authenticated By: Marin Roberts, M.D.    Portable Chest Xray In Am  07/11/2012  *RADIOLOGY REPORT*  Clinical Data: Evaluate endotracheal tube.  PORTABLE CHEST - 1 VIEW  Comparison: 07/10/2012  Findings: Endotracheal tube tip 4.1 cm proximal to the carina. Left subclavian central venous catheter tip projects over the proximal SVC.  NG tube descends below the level of the image. Patchy upper lobe and perihilar interstitial  opacities are similar to prior, and given the rapid onset, favored to reflect edema. Cardiomediastinal contours unchanged.  No pleural effusion or pneumothorax.  No acute osseous finding.  IMPRESSION: Endotracheal tube tip 4.1 cm proximal to the carina.  Similar edema pattern.   Original Report Authenticated By: Jearld Lesch, M.D.    Dg Chest Port 1 View  07/10/2012  *RADIOLOGY REPORT*  Clinical Data: Central line placement.  PORTABLE CHEST - 1 VIEW  Comparison: 07/10/2012  Findings: An endotracheal tube with tip 5 cm above the carina, NG tube with tip overlying the  proximal stomach, and left subclavian central venous catheter with tip overlying the mid SVC noted. Bilateral interstitial and mild airspace opacities likely represent edema. A defibrillator pad overlying the chest is noted. There is no evidence of pleural effusion or pneumothorax.  IMPRESSION: Support apparatus as described.  Left subclavian central venous catheter with tip overlying the mid SVC - no evidence of pneumothorax.  Mild bilateral interstitial and airspace opacities likely representing edema.   Original Report Authenticated By: Harmon Pier, M.D.    Dg Chest Portable 1 View  07/10/2012  *RADIOLOGY REPORT*  Clinical Data: Dysphagia, intubated  PORTABLE CHEST - 1 VIEW  Comparison: Prior chest x-ray obtained earlier today, 07/10/2012 at 03:01 a.m.  Findings: Interval intubation.  The tip of the endotracheal tube is 5.5 cm above the carina.  Nasogastric tube is in place.  The tip projects just within the gastroesophageal junction.  Interval development of bilateral patchy opacities in the upper lungs. Cardiac and mediastinal contours within normal limits.  IMPRESSION:  1.  The tip of the nasogastric tube is just within the stomach, consider advancing 5 cm for more optimal placement. 2.  The tip of the endotracheal tube is 5.5 cm above the carina. 3.  Developing patchy opacities in the bilateral upper lungs may reflect atelectasis,  developing flash pulmonary edema, or potentially aspiration although this is thought less likely given the upper lobe distribution.   Original Report Authenticated By: Malachy Moan, M.D.      Assessment/Plan: 57 year old male now intubated after cardiac and respiratory arrest with complaints of dysphagia and difficulty breathing.  Having difficulty extubating despite adequate mental status.  No evidence of brain stem infarct on imaging.  Myasthenia gravis remains in the differential.     Recommendations: 1.  ANA, ESR, TSH, MG panel 2. CT of the chest with and without contrast to rule out possible thymoma  Thana Farr, MD [facility] [phone] 07/12/2012, 2:03 AM

## 2012-07-12 NOTE — Progress Notes (Signed)
ANTICOAGULATION CONSULT NOTE   Pharmacy Consult for IV heparin Indication: LLE DVT  No Known Allergies  Patient Measurements: Height: 6' (182.9 cm) Weight: 198 lb 3.1 oz (89.9 kg) IBW/kg (Calculated) : 77.6  Heparin Dosing Weight: 88.7kg  Vital Signs: Temp: 101.2 F (38.4 C) (02/01 0400) Temp src: Oral (02/01 0400) BP: 104/59 mmHg (02/01 0500) Pulse Rate: 87  (02/01 0500)  Labs:  Basename 07/12/12 0507 07/11/12 2300 07/11/12 0630 07/11/12 0158 07/10/12 2310 07/10/12 2011  HGB 11.6* -- 11.8* -- -- --  HCT 35.1* -- 35.2* -- -- 44.8  PLT PENDING -- 116* -- -- 159  APTT -- -- -- -- 37 --  LABPROT -- -- -- -- 16.1* --  INR -- -- -- -- 1.32 --  HEPARINUNFRC 0.48 0.44 -- -- -- --  CREATININE 0.61 -- 0.58 -- -- 0.66  CKTOTAL -- -- -- -- -- --  CKMB -- -- -- -- -- --  TROPONINI -- -- 0.49* 0.44* -- <0.30    Estimated Creatinine Clearance: 113.2 ml/min (by C-G formula based on Cr of 0.61).   Medical History: Past Medical History  Diagnosis Date  . DVT (deep venous thrombosis)   . Hypertension   . High cholesterol     Assessment: 56 yoM to start on IV heparin for LLE DVT.  Third admission in 2 weeks for dysphagia, developed respiratory and cardiac arrest in ED 1/30.  Has hx DVT, not on anticoagulation PTA.    Baseline labs:  APPT: 37, PT/INR: 16.1/1.32  CBC: Hgb and plts both trending down.  Hgb 14.9-->11.8, Plts159-->116.   Renal fxn: SCr ok, CrCl >100  Noted elevated troponins x2   Heparin dosing weight: 88.7kg  No hx of bleeding/stroke noted  1st Heparin Level = 0.44 (therapeutic)  2nd Heparin Level = 0.48 (therapeutic)  No complications of therapy noted  CBC pending (07/12/12)   Goal of Therapy:  Heparin level 0.3-0.7 units/ml Monitor platelets by anticoagulation protocol: Yes   Plan:  1.  Continue Heparin infusion at 1500 units/hr (=15 ml/hr) 2.  Daily heparin levels & CBC  Madeline Bebout, Joselyn Glassman, PharmD 07/12/2012,6:03 AM

## 2012-07-13 ENCOUNTER — Inpatient Hospital Stay (HOSPITAL_COMMUNITY): Payer: Medicaid Other

## 2012-07-13 LAB — CBC
HCT: 34.7 % — ABNORMAL LOW (ref 39.0–52.0)
Hemoglobin: 11.5 g/dL — ABNORMAL LOW (ref 13.0–17.0)
MCH: 29.7 pg (ref 26.0–34.0)
MCHC: 33.1 g/dL (ref 30.0–36.0)
MCV: 89.7 fL (ref 78.0–100.0)
RBC: 3.87 MIL/uL — ABNORMAL LOW (ref 4.22–5.81)

## 2012-07-13 LAB — BASIC METABOLIC PANEL
BUN: 10 mg/dL (ref 6–23)
CO2: 20 mEq/L (ref 19–32)
Calcium: 7.7 mg/dL — ABNORMAL LOW (ref 8.4–10.5)
GFR calc non Af Amer: >90 mL/min (ref >90)
Glucose, Bld: 94 mg/dL (ref 70–99)

## 2012-07-13 LAB — TRIGLYCERIDES: Triglycerides: 66 mg/dL (ref <150)

## 2012-07-13 MED ORDER — POTASSIUM CHLORIDE 20 MEQ/15ML (10%) PO LIQD
40.0000 meq | Freq: Once | ORAL | Status: AC
  Administered 2012-07-13: 40 meq
  Filled 2012-07-13: qty 30

## 2012-07-13 MED ORDER — JEVITY 1.2 CAL PO LIQD
1000.0000 mL | ORAL | Status: DC
  Administered 2012-07-13: 1000 mL

## 2012-07-13 MED ORDER — POTASSIUM CHLORIDE 20 MEQ/15ML (10%) PO LIQD
40.0000 meq | ORAL | Status: AC
  Administered 2012-07-13: 40 meq
  Filled 2012-07-13: qty 30

## 2012-07-13 MED ORDER — PROMOTE PO LIQD
1000.0000 mL | ORAL | Status: DC
  Administered 2012-07-13 – 2012-07-20 (×7): 1000 mL
  Filled 2012-07-13 (×18): qty 1000

## 2012-07-13 MED ORDER — PYRIDOSTIGMINE BROMIDE 60 MG PO TABS
60.0000 mg | ORAL_TABLET | Freq: Four times a day (QID) | ORAL | Status: DC
  Administered 2012-07-13 – 2012-07-14 (×4): 60 mg via ORAL
  Filled 2012-07-13 (×8): qty 1

## 2012-07-13 MED ORDER — HEPARIN (PORCINE) IN NACL 100-0.45 UNIT/ML-% IJ SOLN
1650.0000 [IU]/h | INTRAMUSCULAR | Status: DC
  Administered 2012-07-13 (×2): 1650 [IU]/h via INTRAVENOUS
  Filled 2012-07-13 (×2): qty 250

## 2012-07-13 MED ORDER — PANTOPRAZOLE SODIUM 40 MG PO PACK
40.0000 mg | PACK | Freq: Two times a day (BID) | ORAL | Status: DC
  Administered 2012-07-13 – 2012-07-15 (×5): 40 mg
  Filled 2012-07-13 (×6): qty 20

## 2012-07-13 NOTE — Progress Notes (Signed)
ANTICOAGULATION CONSULT NOTE   Pharmacy Consult for IV heparin Indication: LLE DVT  No Known Allergies  Patient Measurements: Height: 6' (182.9 cm) Weight: 198 lb 3.1 oz (89.9 kg) IBW/kg (Calculated) : 77.6  Heparin Dosing Weight: 88.7kg  Vital Signs: Temp: 101 F (38.3 C) (02/02 0400) Temp src: Oral (02/02 0400) BP: 104/50 mmHg (02/02 0421) Pulse Rate: 78  (02/02 0421)  Labs:  Basename 07/13/12 0356 07/12/12 0507 07/11/12 2300 07/11/12 0630 07/11/12 0158 07/10/12 2310 07/10/12 2011  HGB -- 11.6* -- 11.8* -- -- --  HCT -- 35.1* -- 35.2* -- -- 44.8  PLT -- 122* -- 116* -- -- 159  APTT -- -- -- -- -- 37 --  LABPROT -- -- -- -- -- 16.1* --  INR -- -- -- -- -- 1.32 --  HEPARINUNFRC 0.20* 0.48 0.44 -- -- -- --  CREATININE 0.56 0.61 -- 0.58 -- -- --  CKTOTAL -- -- -- -- -- -- --  CKMB -- -- -- -- -- -- --  TROPONINI -- -- -- 0.49* 0.44* -- <0.30    Estimated Creatinine Clearance: 113.2 ml/min (by C-G formula based on Cr of 0.56).   Medical History: Past Medical History  Diagnosis Date  . DVT (deep venous thrombosis)   . Hypertension   . High cholesterol     Assessment: 56 yoM to start on IV heparin for LLE DVT.  Third admission in 2 weeks for dysphagia, developed respiratory and cardiac arrest in ED 1/30.  Has hx DVT, not on anticoagulation PTA.    Baseline labs:  APPT: 37, PT/INR: 16.1/1.32  CBC: Hgb and plts both trending down.  Hgb 14.9-->11.8, Plts159-->116.   Renal fxn: SCr ok, CrCl >100  Noted elevated troponins x2   Heparin dosing weight: 88.7kg  No hx of bleeding/stroke noted  Heparin level this morning SUBtherapeutic despite the 2 previous heparin levels @ same value therapeutic.  No complications of therapy noted  CBC pending   Goal of Therapy:  Heparin level 0.3-0.7 units/ml Monitor platelets by anticoagulation protocol: Yes   Plan:  1.  Increase Heparin infusion to 1650 units/hr (=16.5 ml/hr) 2.  Check heparin level in 6 hr 2.  Daily  heparin levels & CBC   Kees Idrovo, Joselyn Glassman, PharmD 07/13/2012,4:47 AM

## 2012-07-13 NOTE — Progress Notes (Signed)
NIF reading for dayshift on 2/2 was -5 cm H20

## 2012-07-13 NOTE — Progress Notes (Signed)
Brief Nutrition Note/New TF Consult  INTERVENTION:  - Change TF formula to Promote. Initiate at 20 ml/hr. Advance by 10 mL q 8 hrs to 85 mL/hr goal to provide 2040 kcal, 127g protein, 1711 mL free water.  Full assessment completed on 1/31.   Patient remains intubated. OG tube in place. Consult to initiate tube feeds per nutrition (Adult Enteral Nutrition Protocol). Current order for Jevity 1.2 at goal rate of 40 ml/hr (1152 kcal, 53 g protein, 775 ml free water).  MV: 13.5 Temp:  Temp Readings from Last 3 Encounters:  07/13/12 99.4 F (37.4 C)   07/13/12 99.4 F (37.4 C)   07/10/12 98.1 F (36.7 C)     Estimated Nutrition Needs:  Kcal: 2158 kcal Protein: 106-132 g Fluid: >2.2 L  Linnell Fulling, RD, LDN Pager #: [phone] After-Hours Pager #: [phone]

## 2012-07-13 NOTE — Progress Notes (Signed)
  ANTICOAGULATION CONSULT NOTE   Pharmacy Consult for IV heparin Indication: LLE DVT  No Known Allergies  Patient Measurements: Height: 6' (182.9 cm) Weight: 199 lb 4.7 oz (90.4 kg) IBW/kg (Calculated) : 77.6  Heparin Dosing Weight: 88.7kg  Vital Signs: Temp: 99.7 F (37.6 C) (02/02 0800) Temp src: Oral (02/02 0800) BP: 113/61 mmHg (02/02 1100) Pulse Rate: 80  (02/02 1100)  Labs:  Basename 07/13/12 1051 07/13/12 0356 07/12/12 0507 07/11/12 0630 07/11/12 0158 07/10/12 2310 07/10/12 2011  HGB -- 11.5* 11.6* -- -- -- --  HCT -- 34.7* 35.1* 35.2* -- -- --  PLT -- 126* 122* 116* -- -- --  APTT -- -- -- -- -- 37 --  LABPROT -- -- -- -- -- 16.1* --  INR -- -- -- -- -- 1.32 --  HEPARINUNFRC 0.32 0.20* 0.48 -- -- -- --  CREATININE -- 0.56 0.61 0.58 -- -- --  CKTOTAL -- -- -- -- -- -- --  CKMB -- -- -- -- -- -- --  TROPONINI -- -- -- 0.49* 0.44* -- <0.30    Estimated Creatinine Clearance: 113.2 ml/min (by C-G formula based on Cr of 0.56).   Medical History: Past Medical History  Diagnosis Date  . DVT (deep venous thrombosis)   . Hypertension   . High cholesterol     Assessment: 57 yo M started on IV heparin on 1/31 for LLE DVT.  Third admission in 2 weeks for dysphagia, developed respiratory and cardiac arrest in ED 1/30.  Has hx DVT, not on anticoagulation PTA.    11am heparin level is therapeutic (0.32)  H/H stable  Platelets slowly trending down - monitor   No bleeding noted in chart notes  Goal of Therapy:  Heparin level 0.3-0.7 units/ml Monitor platelets by anticoagulation protocol: Yes   Plan:  1. Continue Heparin at 1650 units/hr (=16.5 ml/hr) 2.  Check heparin level in 6 hr to confirm therapeutic dose. 2.  Daily heparin levels & CBC   Darrol Angel, PharmD Pager: [phone] 07/13/2012,11:51 AM

## 2012-07-13 NOTE — Progress Notes (Signed)
eLink Physician-Brief Progress Note Patient Name: Victor Durham DOB: 05/07/1956 MRN: [id number]  Date of Service  07/13/2012   HPI/Events of Note  Hypokalemia   eICU Interventions  Potassium replaced   Intervention Category Intermediate Interventions: Electrolyte abnormality - evaluation and management  [name] 07/13/2012, 4:51 AM

## 2012-07-13 NOTE — Progress Notes (Signed)
NEURO HOSPITALIST PROGRESS NOTE   SUBJECTIVE:                                                                                                                        Events of the past 24 hours noted and clinical notes reviewed. Victor Durham remains intubated. CT chest was unremarkable. MG panel pending. Asked to consider empirical trial of mestinon due to reasonable concerns for MG.  OBJECTIVE:                                                                                                                           Vital signs in last 24 hours: Temp:  [98.4 F (36.9 C)-101 F (38.3 C)] 99.7 F (37.6 C) (02/02 0800) Pulse Rate:  [68-83] 71  (02/02 0800) Resp:  [20-21] 20  (02/02 0800) BP: (90-120)/(50-68) 112/63 mmHg (02/02 0800) SpO2:  [95 %-100 %] 98 % (02/02 0800) FiO2 (%):  [40 %] 40 % (02/02 0815) Weight:  [90.4 kg (199 lb 4.7 oz)] 90.4 kg (199 lb 4.7 oz) (02/02 0500)  Intake/Output from previous day: 02/01 0701 - 02/02 0700 In: 1879.5 [I.V.:1209.5; IV Piggyback:670] Out: 1635 [Urine:1535; Emesis/NG output:100] Intake/Output this shift: Total I/O In: 336.5 [I.V.:56.5; NG/GT:280] Out: 125 [Urine:125] Nutritional status:    Past Medical History  Diagnosis Date  . DVT (deep venous thrombosis)   . Hypertension   . High cholesterol    ROS: unable to obtain.  Neurologic Exam:  Intubated and mechanically ventilate. Mental status: he is alert and able to follow commands, no difficulty with comprehension of language and nods head in response to questioning CN 2-12: pupils 4 mm bilaterally, reactive to light. No gaze preference. Prominent bilateral ptosis but it is hard to establish whether or no there is associated weakness of the right medial rectus . No nystagmus. Face is symmetric. His tongue seems to be midline. Motor: moves all limbs symmetrically, although it is difficulty for me to test shoulder shrug.  Sensory: responds well  to stimuli in all limbs. DTR's: 2+ biceps and knee jerks bilaterally. Plantars: equivocal bilaterally. Coordination and gait: no tested. CV: pulses palpable throughout    Lab Results: Lab Results  Component Value Date/Time   CHOL 223* 06/27/2009  8:22 PM   Lipid Panel  Basename 07/13/12 0356  CHOL --  TRIG 66  HDL --  CHOLHDL --  VLDL --  LDLCALC --    Studies/Results: Ct Chest W Wo Contrast  07/12/2012  *RADIOLOGY REPORT*  Clinical Data: Myasthenia gravis, evaluate for thymoma  CT CHEST WITHOUT AND WITH CONTRAST  Technique:  Multidetector CT imaging of the chest was performed following the standard protocol before and during bolus administration of intravenous contrast.  Contrast: OMNIPAQUE IOHEXOL 300 MG/ML  SOLN  Comparison: Chest x-ray 07/12/2012  Findings:  Mediastinum: Unremarkable CT appearance of the thyroid gland.  No suspicious mediastinal or hilar adenopathy.  There are not enlarged by CT criteria of left paratracheal nodes which measure up to 8 mm in short axis.  No abnormal soft tissue mass in the anterior mediastinum to suggest thymoma.  The patient is intubated.  The tip of the ET tube is above the carina.  A nasogastric tube courses through the esophagus and enters the stomach.  The thoracic esophagus is otherwise unremarkable.  Heart/Vascular: Heart size is at the upper limits of normal.  Trace pericardial fluid versus thickening anteriorly.  Atherosclerotic calcifications identified in the coronary arteries including the left main.  Four-vessel aortic arch.  The left vertebral artery arises directly from the aorta.  No aortic dilatation or dissection.  Limited evaluation of the pulmonary arteries demonstrates no central filling defect to suggest large acute PE.  Lungs/Pleura: Small bilateral effusions.  Bilateral lower lobe atelectasis and additional areas of patchy consolidation.  There is minimal patchy scattered ground-glass opacity in the left upper lobe and  dependently within the right upper lobe.  Right middle lobe atelectasis.  Upper Abdomen: Limited evaluation of the left upper abdomen demonstrates a solid enhancing 4.1 x 3.9 x3.4 cm round mass exophytic from hepatic segment two.  The lesion is very similar in attenuation into the remainder the hepatic parenchyma that demonstrates evidence of peripheral enhancement.  An ovoid low attenuation lesion consistent with a cyst is located and hepatic segment for the anteriorly and measures 4.2 x 1.3 cm.  Trace perisplenic ascites.  The remainder the upper abdomen is unremarkable in appearance.  Bones: No acute fracture or aggressive appearing lytic or blastic osseous lesion.  IMPRESSION:  1.  Negative for thymoma or large central pulmonary embolism.  2.  Bilateral lower lobe consolidation and atelectasis with additional patchy areas of ground-glass air space disease in the left upper lobe and dependently in the right upper lobe suggest multi focal pneumonia, or possibly aspiration.  3.  Bilateral small layering pleural effusions.  4.  Nonspecific enhancing solid 4.1 cm mass exophytic from segment 2 of the left hepatic lobe.  Differential considerations include focal nodular hyperplasia, hepatic adenoma, and primary and metastatic neoplasms.  Recommend further evaluation with non emergent (out patient) MRI.  5. Coronary artery disease as identified by calcified atherosclerotic plaque in the left main coronary artery.  6.  Trace perisplenic ascites.   Original Report Authenticated By: Malachy Moan, M.D.    Victor Durham Wo Contrast  07/11/2012  *RADIOLOGY REPORT*  Clinical Data: Evaluate for CVA.  Possible left pons or right mid brain lesion.  Dysphasia and cardiac arrest.  6 minutes of CPR.  MRI HEAD WITHOUT AND WITH CONTRAST  Technique:  Multiplanar, multiecho pulse sequences of the brain and surrounding structures were obtained according to standard protocol without and with intravenous contrast  Contrast: 18mL  MULTIHANCE GADOBENATE DIMEGLUMINE 529 MG/ML IV SOLN  Comparison: CT head without contrast 07/10/2012.  Findings: The diffusion weighted images demonstrate no evidence for acute or subacute infarct.  There is no hemorrhage or mass lesion. Scattered subcortical T2 hyperintensities are greater than expected for age.  The ventricles are normal size.  No significant extra- axial fluid collections present.  Flow is present major intracranial arteries.  The globes and orbits are intact.  The paranasal sinuses are clear.  Fluid is present in the mastoid air cells bilaterally.  There is also fluid in the nasopharynx.  The postcontrast images demonstrate no pathologic enhancement.  IMPRESSION:  1.  No acute intracranial abnormality. 2.  Scattered subcortical T2 hyperintensities are greater than expected for age. The finding is nonspecific but can be seen in the setting of chronic microvascular ischemia, a demyelinating process such as multiple sclerosis, vasculitis, complicated migraine headaches, or as the sequelae of a prior infectious or inflammatory process. 3.  Bilateral mastoid effusions are likely secondary to intubation.   Original Report Authenticated By: Marin Roberts, M.D.    Dg Chest Port 1 View  07/13/2012  *RADIOLOGY REPORT*  Clinical Data: Follow up pneumonia  PORTABLE CHEST - 1 VIEW  Comparison: CT chest dated 07/12/2012  Findings: Patchy bilateral lower lobe opacities, suspicious for pneumonia.  Moderate right and small left pleural effusions.  No pneumothorax.  The heart is top normal in size.  Endotracheal tube terminates 6 cm above the carina.  Left subclavian venous catheter terminates in the mid SVC.  Enteric tube courses below the diaphragm.  IMPRESSION: Patchy bilateral lower lobe opacities, suspicious for pneumonia.  Moderate right and small left pleural effusions.  Endotracheal tube terminates 6 cm above the carina.   Original Report Authenticated By: Charline Bills, M.D.    Dg Chest Port  1 View  07/12/2012  *RADIOLOGY REPORT*  Clinical Data: Intubated  PORTABLE CHEST - 1 VIEW  Comparison:   the previous day's study  Findings: Endotracheal tube tip 3.4 cm above carina.  Stable left subclavian central line.  The nasogastric tube has been retracted. Patchy bibasilar atelectasis or consolidation has developed.  No definite effusion.  Heart size remains normal.  IMPRESSION:  1.  Endotracheal tube in stable position. 2.  Retraction and nasogastric tube 3.  Worsening bibasilar atelectasis/consolidation.   Original Report Authenticated By: D. Andria Rhein, MD     MEDICATIONS                                                                                                                       I have reviewed the patient's current medications.  ASSESSMENT/PLAN:  Respiratory failure in a 57 years old with progressive dysphagia and bilateral ptosis. Agree with an empiric trial of mestinon 60 mg every 6 hours pending the results of serological MG testing. Other options include IVIG and plasmapheresis if MG diagnosis confirmed and respiratory failure persists. Will follow up closely.      Wyatt Portela, MD [facility] [phone]  07/13/2012, 11:07 AM

## 2012-07-13 NOTE — Progress Notes (Signed)
PULMONARY  / CRITICAL CARE MEDICINE  Name: Victor Durham MRN: [id number] DOB: [birth year]    ADMISSION DATE:  07/10/2012 CONSULTATION DATE:  1/30/20143  REFERRING MD :  Juleen China  CHIEF COMPLAINT:  Dysphagia  BRIEF PATIENT DESCRIPTION: 57 y/o homeless male presented to the [facility] ED On 1/30 for the third time in two weeks for evaluation of dysphagia.  He complained of some difficulty breathing and underwent a neck CT.  Shortly after that procedure he developed respiratory and cardiac arrest.  PCCM called for admission.  SIGNIFICANT EVENTS / STUDIES:  1/30 CT head >> hypoattenuation R midbrain extending into L pons, poorly characterized due to streak artifact, recommend MRI brain 1/30 respiratory and cardiac arrest in ED 1/31 EGD gastritis and esophagitis but no mass or bleeding source 1/31 LLE clot burden - heparin gtt 1/31 Thick secretions -plugged ETT during MRI 1/31 MRI -Scattered subcortical T2 hyperintensities are greater than expected for age -nonspecific but can be seen in the  setting of chronic microvascular ischemia, a demyelinating process such as multiple sclerosis, vasculitis, complicated migraine headaches, or as the sequelae of a prior infectious or inflammatory process. 2/1 CTc hest - no thymoma or central PE, Bilateral lower lobe consolidation and atelectasis , Nonspecific enhancing solid 4.1 cm mass        LINES / TUBES: 1/30 ETT >> 1/30 L [place] CVL >>  CULTURES: 1/30 resp >> 1/30 blood >>ng 1/30 urine >>ng  ANTIBIOTICS: unasyn 1/30>>> levaquin 1/30>>>  SUBJECTIVE:  Appears comfortable, denies pain C/o sob on PS 10/5 Febrile 101  VITAL SIGNS: Temp:  [98.4 F (36.9 C)-101 F (38.3 C)] 99.7 F (37.6 C) (02/02 0800) Pulse Rate:  [68-88] 71  (02/02 0800) Resp:  [18-21] 20  (02/02 0800) BP: (90-120)/(50-68) 112/63 mmHg (02/02 0800) SpO2:  [94 %-100 %] 98 % (02/02 0800) FiO2 (%):  [40 %] 40 % (02/02 0815) Weight:  [90.4 kg (199 lb 4.7 oz)] 90.4 kg (199 lb  4.7 oz) (02/02 0500) HEMODYNAMICS: CVP:  [8 mmHg] 8 mmHg VENTILATOR SETTINGS: Vent Mode:  [-] CPAP;PSV FiO2 (%):  [40 %] 40 % Set Rate:  [20 bmp] 20 bmp Vt Set:  [409 mL] 620 mL PEEP:  [5 cmH20] 5 cmH20 Pressure Support:  [10 cmH20-12 cmH20] 12 cmH20 Plateau Pressure:  [13 cmH20-18 cmH20] 17 cmH20 INTAKE / OUTPUT: Intake/Output      02/01 0701 - 02/02 0700 02/02 0701 - 02/03 0700   I.V. (mL/kg) 1209.5 (13.4) 56.5 (0.6)   Other     NG/GT  280   IV Piggyback 670    Total Intake(mL/kg) 1879.5 (20.8) 336.5 (3.7)   Urine (mL/kg/hr) 1535 (0.7) 125   Emesis/NG output 100    Total Output 1635 125   Net +244.5 +211.5          PHYSICAL EXAMINATION: Gen: awake on vent, no acute distress HEENT: NCAT, PERRL, EOMi, ETT in place, thick, copious oral secretions PULM: scatteered rhonchi  CV: RRR, no mgr, no JVD AB: BS+, soft, nontender, no hsm Ext: warm, pitting edema in feet and chronic lymphedema changes bilaterally, no clubbing, no cyanosis Derm: lots of dry skin, feet lymphedema as noted above Neuro: follows commands , power 4/5 BUEs & LEs, ptosis +   LABS:  Lab 07/13/12 0356 07/12/12 0507 07/11/12 0630 07/11/12 0452 07/11/12 0158 07/10/12 2342 07/10/12 2310 07/10/12 2011 07/10/12 1645 07/10/12 0025  HGB 11.5* 11.6* 11.8* -- -- -- -- -- -- --  WBC 10.4 9.5 6.6 -- -- -- -- -- -- --  PLT 126* 122* 116* -- -- -- -- -- -- --  NA 139 138 138 -- -- -- -- -- -- --  K 3.2* 3.1* -- -- -- -- -- -- -- --  CL 106 107 106 -- -- -- -- -- -- --  CO2 20 20 21  -- -- -- -- -- -- --  GLUCOSE 94 99 94 -- -- -- -- -- -- --  BUN 10 10 13  -- -- -- -- -- -- --  CREATININE 0.56 0.61 0.58 -- -- -- -- -- -- --  CALCIUM 7.7* 7.7* 7.9* -- -- -- -- -- -- --  MG -- -- -- -- -- -- -- 2.3 -- --  PHOS -- -- -- -- -- -- -- 3.6 -- --  AST -- 76* -- -- -- -- -- -- 28 --  ALT -- 104* -- -- -- -- -- -- 15 --  ALKPHOS -- 41 -- -- -- -- -- -- 59 --  BILITOT -- 1.2 -- -- -- -- -- -- 1.3* --  PROT -- 5.5* -- -- --  -- -- -- 7.9 --  ALBUMIN -- 2.3* -- -- -- -- -- -- 3.7 --  APTT -- -- -- -- -- -- 37 -- -- --  INR -- -- -- -- -- -- 1.32 -- -- --  LATICACIDVEN -- -- -- -- -- -- -- -- 1.3 1.3  TROPONINI -- -- 0.49* -- 0.44* -- -- <0.30 -- --  PROCALCITON -- -- -- -- -- 0.11 -- -- -- --  PROBNP -- -- -- -- -- -- -- -- -- --  O2SATVEN -- -- -- -- -- -- -- -- -- --  PHART -- -- -- 7.436 -- -- -- 7.309* -- --  PCO2ART -- -- -- 28.8* -- -- -- 47.5* -- --  PO2ART -- -- -- 152.0* -- -- -- 315.0* -- --    Lab 07/11/12 0755 07/10/12 2001  GLUCAP 90 106*    2/2 ZOX:WRUEAVW ASD  1/30 EKG: NSR, LAFB, no ST wave changes  ASSESSMENT / PLAN:  PULMONARY A: 1) Acute respiratory failure in setting of complaints of two weeks of dysphagia and what sounds like difficulty maintaining secretions in upper airway;   Consider esophageal obstruction vs bulbar dysphagia (pseudonuclear palsy, myasthenia, vs other neuromuscular process). 2) Upper lobe airspace disease -favor aspiration P:   -SBTs but hold off extubation due to weakness & secretions -Have discussed with Neuro about empiric Rx for myasthenia while waiting on definitive results -daily NIF (-10) -prn bronchodilators -await respiratory cultures  CARDIOVASCULAR A:  1) Cardiac arrest in what sounds like acute respiratory failure; had bradycardia likely due to profound hypoxemia;  Responded quickly with ~6 min CPR and epi x2.  EKG without arrythmia afterwards.  Favor respiratory cause of arrest. Mild trop bump d/t CPR.  -echo 1/31 - dialted RA/RV, septal dyssynergy, nml LVEF Suspect PE even though CT angio P:  -tele -ASA  RENAL A:   1) Hypokalemia - doubt periodic paralysis P:   -repleted, follow  GASTROINTESTINAL A:   1) Dysphagia >> inability to tolerate esophogram on 1/30; no lesion on CT neck 1/31 EGD gastritis and esophagitis but no mass or bleeding source P:   -OG in place (per nursing, passed easily) - neuro work up of dysphagia (MRI,  myasthenia labs) -PPI bid -start TFs  HEMATOLOGIC A:   1) History of DVT, not on anticoagulation 1/31 DVT POS left femoral, popliteal, and posterior tibial veins Suspect PE  P:  -lV heparin  INFECTIOUS A:   1) Aspiration pneumonitis vs pneumonia P:   -unasyn and levaquin -f/u cultures  ENDOCRINE A:   1) no acute issues P:   -monitor glucose  NEUROLOGIC A:  1)  Dysphagia of uncertain etiology 2)  CT head with R midbrain to L pons hypodensity of undetermined significance P:   - work up for neuromuscular cause (ie. MG) with acetylcholine receptor Ab (pending), father had MG  -? Empiric mastinon - Neuro to decide -fentanyl/propofol for sedation   GLOBAL -Attilio Hochstatter [name] [phone],  back up contact for father is: [name]: [phone] Updated pt & dad  TODAY'S SUMMARY:   I have personally obtained a history, examined the patient, evaluated laboratory and imaging results, formulated the assessment and plan and placed orders. CRITICAL CARE: The patient is critically ill with multiple organ systems failure and requires high complexity decision making for assessment and support, frequent evaluation and titration of therapies, application of advanced monitoring technologies and extensive interpretation of multiple databases. Critical Care Time devoted to patient care services described in this note is 35 minutes.    Cyril Mourning MD. Tonny Bollman. [facility] Pager [phone] If no response call [phone]   07/13/2012, 9:19 AM

## 2012-07-13 NOTE — Progress Notes (Signed)
  ANTICOAGULATION CONSULT NOTE   Pharmacy Consult for IV heparin Indication: LLE DVT  No Known Allergies  Patient Measurements: Height: 6' (182.9 cm) Weight: 199 lb 4.7 oz (90.4 kg) IBW/kg (Calculated) : 77.6  Heparin Dosing Weight: 88.7kg  Vital Signs: Temp: 100 F (37.8 C) (02/02 1600) Temp src: Oral (02/02 1600) BP: 111/66 mmHg (02/02 1600) Pulse Rate: 74  (02/02 1600)  Labs:  Basename 07/13/12 1646 07/13/12 1051 07/13/12 0356 07/12/12 0507 07/11/12 0630 07/11/12 0158 07/10/12 2310 07/10/12 2011  HGB -- -- 11.5* 11.6* -- -- -- --  HCT -- -- 34.7* 35.1* 35.2* -- -- --  PLT -- -- 126* 122* 116* -- -- --  APTT -- -- -- -- -- -- 37 --  LABPROT -- -- -- -- -- -- 16.1* --  INR -- -- -- -- -- -- 1.32 --  HEPARINUNFRC 0.30 0.32 0.20* -- -- -- -- --  CREATININE -- -- 0.56 0.61 0.58 -- -- --  CKTOTAL -- -- -- -- -- -- -- --  CKMB -- -- -- -- -- -- -- --  TROPONINI -- -- -- -- 0.49* 0.44* -- <0.30    Estimated Creatinine Clearance: 113.2 ml/min (by C-G formula based on Cr of 0.56).   Medical History: Past Medical History  Diagnosis Date  . DVT (deep venous thrombosis)   . Hypertension   . High cholesterol     Assessment: 57 yo M started on IV heparin on 1/31 for LLE DVT.  Third admission in 2 weeks for dysphagia, developed respiratory and cardiac arrest in ED 1/30.  Has hx DVT, not on anticoagulation PTA.    11am heparin level is therapeutic (0.32), Repeat level tonight tx (0.30)  No bleeding noted in chart notes  Goal of Therapy:  Heparin level 0.3-0.7 units/ml Monitor platelets by anticoagulation protocol: Yes   Plan:  1. Continue Heparin at 1650 units/hr (=16.5 ml/hr) 2.  Daily heparin levels & CBC  Gwen Her PharmD  [phone] 07/13/2012 5:27 PM

## 2012-07-14 ENCOUNTER — Encounter (HOSPITAL_COMMUNITY): Payer: Self-pay | Admitting: Gastroenterology

## 2012-07-14 ENCOUNTER — Inpatient Hospital Stay (HOSPITAL_COMMUNITY): Payer: Medicaid Other

## 2012-07-14 DIAGNOSIS — G709 Myoneural disorder, unspecified: Secondary | ICD-10-CM | POA: Diagnosis present

## 2012-07-14 LAB — BASIC METABOLIC PANEL
CO2: 24 mEq/L (ref 19–32)
Calcium: 7.8 mg/dL — ABNORMAL LOW (ref 8.4–10.5)
Chloride: 106 mEq/L (ref 96–112)
Glucose, Bld: 100 mg/dL — ABNORMAL HIGH (ref 70–99)
Sodium: 139 mEq/L (ref 135–145)

## 2012-07-14 LAB — CBC
Hemoglobin: 12.1 g/dL — ABNORMAL LOW (ref 13.0–17.0)
MCH: 30.4 pg (ref 26.0–34.0)
MCV: 89.4 fL (ref 78.0–100.0)
RBC: 3.98 MIL/uL — ABNORMAL LOW (ref 4.22–5.81)

## 2012-07-14 LAB — HEPARIN LEVEL (UNFRACTIONATED): Heparin Unfractionated: 0.32 IU/mL (ref 0.30–0.70)

## 2012-07-14 LAB — ANA: Anti Nuclear Antibody(ANA): NEGATIVE

## 2012-07-14 MED ORDER — IMMUNE GLOBULIN (HUMAN) 5 GM/100ML IV SOLN
400.0000 mg/kg | INTRAVENOUS | Status: AC
  Administered 2012-07-14 – 2012-07-16 (×3): 35 g via INTRAVENOUS
  Filled 2012-07-14 (×3): qty 100

## 2012-07-14 MED ORDER — HEPARIN (PORCINE) IN NACL 100-0.45 UNIT/ML-% IJ SOLN
1800.0000 [IU]/h | INTRAMUSCULAR | Status: DC
  Administered 2012-07-14: 1800 [IU]/h via INTRAVENOUS
  Filled 2012-07-14 (×3): qty 250

## 2012-07-14 MED ORDER — POTASSIUM CHLORIDE 20 MEQ/15ML (10%) PO LIQD
60.0000 meq | Freq: Once | ORAL | Status: AC
  Administered 2012-07-14: 60 meq
  Filled 2012-07-14: qty 45

## 2012-07-14 MED ORDER — HEPARIN (PORCINE) IN NACL 100-0.45 UNIT/ML-% IJ SOLN
1850.0000 [IU]/h | INTRAMUSCULAR | Status: DC
  Administered 2012-07-14: 1850 [IU]/h via INTRAVENOUS
  Filled 2012-07-14 (×2): qty 250

## 2012-07-14 NOTE — Progress Notes (Signed)
NUTRITION FOLLOW UP  Intervention:   1.  Enteral nutrition; continue current management.  Pt currently on TFs and advancing to goal of Promote @ 85 mL/hr. 2.  Other providers; monitor for need for SLP assessment of dysphagia prior to starting PO diet.  Nutrition Dx:   Inadequate oral intake, ongoing  Monitor:   1. Enteral nutrition; initiation with tolerance if pt to remain intubated >48 hrs. Pt to meet >/=90% estimated needs.  Somewhat met, pt initiated TFs and is advancing to goal.    Assessment:   Pt admitted with dysphagia. Developed cardiac and respiratory arrest in the ED. Patient is currently intubated on ventilator support.  MV: 9.8 L/min Temp:Temp (24hrs), Avg:99.1 F (37.3 C), Min:98.2 F (36.8 C), Max:100 F (37.8 C)  Ongoing workup for etiology of respiratory failure.  Pt has started rx for possible myasthenias gravis. Pt is awake on ventilator.  Is able to communicate comfort with current TFs.  Pt is receiving Promote @ 40 mL/hr which provides 960 kcal, 60g protein, 805 mL free water.  Pt's goal rate is 85 mL/hr.  Advancements slowed for possible refeeding risk.  Labs reviewed, no evidence of refeeding syndrome at this time.  Height: Ht Readings from Last 1 Encounters:  07/11/12 6' (1.829 m)    Weight Status:   Wt Readings from Last 1 Encounters:  07/14/12 203 lb 4.2 oz (92.2 kg)    Re-estimated needs:  Kcal: 2115 Protein: 106-132g Fluid: >2.2 L/day or per MD discretion  Skin: intact, moderating pitting edema to lower extremity  Diet Order:     Intake/Output Summary (Last 24 hours) at 07/14/12 1100 Last data filed at 07/14/12 0800  Gross per 24 hour  Intake   2129 ml  Output    905 ml  Net   1224 ml    Last BM: 2/2   Labs:   Lab 07/14/12 0348 07/13/12 0356 07/12/12 0507 07/10/12 2011  NA 139 139 138 --  K 3.4* 3.2* 3.1* --  CL 106 106 107 --  CO2 24 20 20  --  BUN 15 10 10  --  CREATININE 0.57 0.56 0.61 --  CALCIUM 7.8* 7.7* 7.7* --  MG --  -- -- 2.3  PHOS -- -- -- 3.6  GLUCOSE 100* 94 99 --    CBG (last 3)  No results found for this basename: GLUCAP:3 in the last 72 hours  Scheduled Meds:   . ampicillin-sulbactam (UNASYN) IV  3 g Intravenous Q6H  . antiseptic oral rinse  15 mL Mouth Rinse QID  . chlorhexidine  15 mL Mouth Rinse BID  . famotidine (PEPCID) IV  20 mg Intravenous Q12H  . Immune Globulin 5%  400 mg/kg Intravenous Q24 Hr x 5  . levofloxacin (LEVAQUIN) IV  750 mg Intravenous QHS  . pantoprazole sodium  40 mg Per Tube BID    Continuous Infusions:   . sodium chloride 20 mL (07/14/12 0000)  . feeding supplement (PROMOTE) 1,000 mL (07/13/12 1500)  . heparin 1,800 Units/hr (07/14/12 0825)    Loyce Dys, MS RD LDN Clinical Inpatient Dietitian Pager: [phone] Weekend/After hours pager: [phone]

## 2012-07-14 NOTE — Progress Notes (Signed)
ANTICOAGULATION CONSULT NOTE - Follow Up Consult  Pharmacy Consult for Heparin Indication: DVT  No Known Allergies  Patient Measurements: Height: 6' (182.9 cm) Weight: 203 lb 4.2 oz (92.2 kg) IBW/kg (Calculated) : 77.6  Heparin Dosing Weight: 89 kg  Vital Signs: Temp: 98.4 F (36.9 C) (02/03 1600) Temp src: Axillary (02/03 1600) BP: 103/61 mmHg (02/03 1713) Pulse Rate: 71  (02/03 1713)  Labs:  Basename 07/14/12 1811 07/14/12 1159 07/14/12 0348 07/13/12 0356 07/12/12 0507  HGB -- -- 12.1* 11.5* --  HCT -- -- 35.6* 34.7* 35.1*  PLT -- -- 122* 126* 122*  APTT -- -- -- -- --  LABPROT -- -- -- -- --  INR -- -- -- -- --  HEPARINUNFRC 0.32 0.38 0.24* -- --  CREATININE -- -- 0.57 0.56 0.61  CKTOTAL -- -- -- -- --  CKMB -- -- -- -- --  TROPONINI -- -- -- -- --    Estimated Creatinine Clearance: 113.2 ml/min (by C-G formula based on Cr of 0.57).   Assessment:  56 yom started on IV heparin on 1/31 for LLE DVT.  Third admission in 2 weeks for dysphagia, developed respiratory and cardiac arrest in ED 1/30.  Has hx DVT, not on anticoagulation PTA.  Day # 4 Heparin infusion.  Heparin level therapeutic (0.38) this AM. Confirmatory heparin level is 0.31 (lower end of therapeutic).  CBC:  Stable Hgb, Plt are low/stable  No bleeding noted.  Goal of Therapy:  Heparin level 0.3-0.7 units/ml Monitor platelets by anticoagulation protocol: Yes   Plan:   Increase Heparin IV infusion slightly to 1850 units/hr  Daily heparin level, CBC  Follow up long-term anticoagulation plan.  Geoffry Paradise, PharmD, BCPS Pager: [phone] 7:15 PM Pharmacy #: [id number]

## 2012-07-14 NOTE — Progress Notes (Signed)
NEURO HOSPITALIST PROGRESS NOTE   SUBJECTIVE:                                                                                                                        No significant events over 24 hours. Patient remains intubated and able to follow commands such as holding arms up and writing his thoughts on paper. He states his right eye feels only "so so" improved with the Mestinon.   --per nurse his oral secretions have increased.   OBJECTIVE:                                                                                                                           Vital signs in last 24 hours: Temp:  [98.2 F (36.8 C)-100 F (37.8 C)] 98.7 F (37.1 C) (02/03 0800) Pulse Rate:  [59-86] 77  (02/03 0810) Resp:  [19-29] 20  (02/03 0810) BP: (91-127)/(51-67) 120/64 mmHg (02/03 0810) SpO2:  [91 %-99 %] 92 % (02/03 0810) FiO2 (%):  [40 %] 40 % (02/03 0810) Weight:  [92.2 kg (203 lb 4.2 oz)] 92.2 kg (203 lb 4.2 oz) (02/03 0400)  Intake/Output from previous day: 02/02 0701 - 02/03 0700 In: 2560.5 [I.V.:990.5; NG/GT:920; IV Piggyback:650] Out: 950 [Urine:950] Intake/Output this shift: Total I/O In: 78 [I.V.:38; NG/GT:40] Out: 80 [Urine:80] Nutritional status:    Past Medical History  Diagnosis Date  . DVT (deep venous thrombosis)   . Hypertension   . High cholesterol      Neurologic Exam:  Mental Status: Alert, intubated.  Able to write his thoughts on paper.  Cranial Nerves: II:  Visual fields grossly normal when eyelids held open. Pupils equal, round, reactive to light and accommodation III,IV, VI: ptosis present right eye, unable to hold left eyelid open, horizontal gaze full bilaterally, decreased ability to look down with left eye and decreased ability to look up bilaterally.  V,VII: smile symmetric, facial light touch sensation normal bilaterally VIII: hearing normal bilaterally IX,X: gag reflex present XI: bilateral shoulder  shrug XII: midline tongue extension Motor: Right : Upper extremity   5/5 distally 3/5 proximally  Left:     Upper extremity   5/5 distally 3/5 proximally  Lower extremity   5/5  Lower extremity   5/5 Tone and bulk:normal tone throughout; no atrophy noted Sensory: Pinprick and light touch intact throughout, bilaterally Deep Tendon Reflexes: 1+ and symmetric throughout no AJ Plantars: equivical Cerebellar: normal finger-to-nose but right side appears weaker CV: pulses palpable throughout     Lab Results: Lab Results  Component Value Date/Time   CHOL 223* 06/27/2009  8:22 PM   Lipid Panel  Basename 07/13/12 0356  CHOL --  TRIG 66  HDL --  CHOLHDL --  VLDL --  LDLCALC --    Studies/Results: Ct Chest W Wo Contrast  07/12/2012  *RADIOLOGY REPORT*  Clinical Data: Myasthenia gravis, evaluate for thymoma  CT CHEST WITHOUT AND WITH CONTRAST  Technique:  Multidetector CT imaging of the chest was performed following the standard protocol before and during bolus administration of intravenous contrast.  Contrast: OMNIPAQUE IOHEXOL 300 MG/ML  SOLN  Comparison: Chest x-ray 07/12/2012  Findings:  Mediastinum: Unremarkable CT appearance of the thyroid gland.  No suspicious mediastinal or hilar adenopathy.  There are not enlarged by CT criteria of left paratracheal nodes which measure up to 8 mm in short axis.  No abnormal soft tissue mass in the anterior mediastinum to suggest thymoma.  The patient is intubated.  The tip of the ET tube is above the carina.  A nasogastric tube courses through the esophagus and enters the stomach.  The thoracic esophagus is otherwise unremarkable.  Heart/Vascular: Heart size is at the upper limits of normal.  Trace pericardial fluid versus thickening anteriorly.  Atherosclerotic calcifications identified in the coronary arteries including the left main.  Four-vessel aortic arch.  The left vertebral artery arises directly from the aorta.  No aortic dilatation or  dissection.  Limited evaluation of the pulmonary arteries demonstrates no central filling defect to suggest large acute PE.  Lungs/Pleura: Small bilateral effusions.  Bilateral lower lobe atelectasis and additional areas of patchy consolidation.  There is minimal patchy scattered ground-glass opacity in the left upper lobe and dependently within the right upper lobe.  Right middle lobe atelectasis.  Upper Abdomen: Limited evaluation of the left upper abdomen demonstrates a solid enhancing 4.1 x 3.9 x3.4 cm round mass exophytic from hepatic segment two.  The lesion is very similar in attenuation into the remainder the hepatic parenchyma that demonstrates evidence of peripheral enhancement.  An ovoid low attenuation lesion consistent with a cyst is located and hepatic segment for the anteriorly and measures 4.2 x 1.3 cm.  Trace perisplenic ascites.  The remainder the upper abdomen is unremarkable in appearance.  Bones: No acute fracture or aggressive appearing lytic or blastic osseous lesion.  IMPRESSION:  1.  Negative for thymoma or large central pulmonary embolism.  2.  Bilateral lower lobe consolidation and atelectasis with additional patchy areas of ground-glass air space disease in the left upper lobe and dependently in the right upper lobe suggest multi focal pneumonia, or possibly aspiration.  3.  Bilateral small layering pleural effusions.  4.  Nonspecific enhancing solid 4.1 cm mass exophytic from segment 2 of the left hepatic lobe.  Differential considerations include focal nodular hyperplasia, hepatic adenoma, and primary and metastatic neoplasms.  Recommend further evaluation with non emergent (out patient) MRI.  5. Coronary artery disease as identified by calcified atherosclerotic plaque in the left main coronary artery.  6.  Trace perisplenic ascites.   Original Report Authenticated By: Malachy Moan, M.D.    Dg Chest Port 1 View  07/14/2012  *RADIOLOGY REPORT*  Clinical Data: Pneumonia  PORTABLE  CHEST - 1 VIEW  Comparison: Multiple recent previous exams.  Findings: 0505 hours.  Endotracheal tube tip is 2.4 cm above the base of the carina.  NG tube is looped in the stomach with the tip overlying the fundus.  A left subclavian central line tip overlies the proximal SVC level.  Aeration at the lung bases is improved with some minimal residual atelectasis.  Small bilateral pleural effusions suspected. Cardiopericardial silhouette is at upper limits of normal for size. Telemetry leads overlie the chest.  IMPRESSION: Interval improvement in basilar aeration with probable small bilateral pleural effusions.   Original Report Authenticated By: Kennith Center, M.D.    Dg Chest Port 1 View  07/13/2012  *RADIOLOGY REPORT*  Clinical Data: Follow up pneumonia  PORTABLE CHEST - 1 VIEW  Comparison: CT chest dated 07/12/2012  Findings: Patchy bilateral lower lobe opacities, suspicious for pneumonia.  Moderate right and small left pleural effusions.  No pneumothorax.  The heart is top normal in size.  Endotracheal tube terminates 6 cm above the carina.  Left subclavian venous catheter terminates in the mid SVC.  Enteric tube courses below the diaphragm.  IMPRESSION: Patchy bilateral lower lobe opacities, suspicious for pneumonia.  Moderate right and small left pleural effusions.  Endotracheal tube terminates 6 cm above the carina.   Original Report Authenticated By: Charline Bills, M.D.     MEDICATIONS                                                                                                                        Scheduled:   . ampicillin-sulbactam (UNASYN) IV  3 g Intravenous Q6H  . antiseptic oral rinse  15 mL Mouth Rinse QID  . chlorhexidine  15 mL Mouth Rinse BID  . famotidine (PEPCID) IV  20 mg Intravenous Q12H  . levofloxacin (LEVAQUIN) IV  750 mg Intravenous QHS  . pantoprazole sodium  40 mg Per Tube BID  . pyridostigmine  60 mg Oral Q6H    ASSESSMENT/PLAN:                                                                                                               Respiratory failure in a 57 years old with progressive dysphagia and bilateral ptosis.  Has now had 4 doses of Mestinon 60 mg every 6 hours pending the results of serological MG testing. Patient has had increasing secretions over night requiring significant and frequent suctioning.  Due to increased secretions will stop Mestinon and start IVIG .4 G/kg every other day  for a total of 5 doses. MG labs still pending. Will follow up closely.   Assessment and plan discussed with with attending physician Dr Leroy Kennedy and he is in agreement.    Felicie Morn PA-C [facility] [phone]  07/14/2012, 8:26 AM

## 2012-07-14 NOTE — Progress Notes (Signed)
PULMONARY  / CRITICAL CARE MEDICINE  Name: Victor Durham MRN: [id number] DOB: 04-13-1956    ADMISSION DATE:  07/10/2012 CONSULTATION DATE:  1/30/20143  REFERRING MD :  Juleen China  CHIEF COMPLAINT:  Dysphagia  BRIEF PATIENT DESCRIPTION: 57 y/o homeless male presented to the [facility] ED On 1/30 for the third time in two weeks for evaluation of dysphagia.  He complained of some difficulty breathing and underwent a neck CT.  Shortly after that procedure he developed respiratory and cardiac arrest.  PCCM called for admission.  SIGNIFICANT EVENTS / STUDIES:  1/30 CT head >> hypoattenuation R midbrain extending into L pons, poorly characterized due to streak artifact, recommend MRI brain 1/30 respiratory and cardiac arrest in ED 1/31 EGD gastritis and esophagitis but no mass or bleeding source 1/31 LLE clot burden - heparin gtt 1/31 Thick secretions -plugged ETT during MRI 1/31 MRI -Scattered subcortical T2 hyperintensities are greater than expected for age -nonspecific but can be seen in the  setting of chronic microvascular ischemia, a demyelinating process such as multiple sclerosis, vasculitis, complicated migraine headaches, or as the sequelae of a prior infectious or inflammatory process. 2/1 CTc hest - no thymoma or central PE, Bilateral lower lobe consolidation and atelectasis , Nonspecific enhancing solid 4.1 cm mass        LINES / TUBES: 1/30 ETT >> 1/30 L Arjay CVL >>  CULTURES: 1/30 resp >> 1/30 blood >>ng 1/30 urine >>ng 2-2 sputum>> 2-1 bc x 2>> ANTIBIOTICS: unasyn 1/30>>> levaquin 1/30>>>  SUBJECTIVE:  Appears comfortable, denies pain On ps 12  VITAL SIGNS: Temp:  [98.2 F (36.8 C)-100 F (37.8 C)] 98.7 F (37.1 C) (02/03 0800) Pulse Rate:  [59-86] 77  (02/03 0810) Resp:  [19-29] 20  (02/03 0810) BP: (91-127)/(51-67) 120/64 mmHg (02/03 0810) SpO2:  [91 %-99 %] 92 % (02/03 0810) FiO2 (%):  [40 %] 40 % (02/03 0810) Weight:  [92.2 kg (203 lb 4.2 oz)] 92.2 kg (203  lb 4.2 oz) (02/03 0400) HEMODYNAMICS: CVP:  [8 mmHg-11 mmHg] 8 mmHg VENTILATOR SETTINGS: Vent Mode:  [-] PRVC FiO2 (%):  [40 %] 40 % Set Rate:  [20 bmp] 20 bmp Vt Set:  [284 mL] 620 mL PEEP:  [5 cmH20] 5 cmH20 Plateau Pressure:  [17 cmH20-19 cmH20] 19 cmH20 INTAKE / OUTPUT: Intake/Output      02/02 0701 - 02/03 0700 02/03 0701 - 02/04 0700   I.V. (mL/kg) 990.5 (10.7) 38 (0.4)   NG/GT 920 40   IV Piggyback 650    Total Intake(mL/kg) 2560.5 (27.8) 78 (0.8)   Urine (mL/kg/hr) 950 (0.4) 80   Emesis/NG output     Total Output 950 80   Net +1610.5 -2        Stool Occurrence       PHYSICAL EXAMINATION: Gen: awake on vent, no acute distress HEENT: NCAT, PERRL, EOMi, ETT in place, thick, copious oral secretions PULM: scatteered rhonchi  CV: RRR, no mgr, no JVD AB: BS+, soft, nontender, no hsm Ext: warm, pitting edema in feet and chronic lymphedema changes bilaterally, no clubbing, no cyanosis Derm: lots of dry skin, feet lymphedema as noted above Neuro: follows commands , power 4/5 BUEs & LEs, ptosis +   LABS:  Lab 07/14/12 0348 07/13/12 0356 07/12/12 0507 07/11/12 0630 07/11/12 0452 07/11/12 0158 07/10/12 2342 07/10/12 2310 07/10/12 2011 07/10/12 1645 07/10/12 0025  HGB 12.1* 11.5* 11.6* -- -- -- -- -- -- -- --  WBC 8.1 10.4 9.5 -- -- -- -- -- -- -- --  PLT 122* 126* 122* -- -- -- -- -- -- -- --  NA 139 139 138 -- -- -- -- -- -- -- --  K 3.4* 3.2* -- -- -- -- -- -- -- -- --  CL 106 106 107 -- -- -- -- -- -- -- --  CO2 24 20 20  -- -- -- -- -- -- -- --  GLUCOSE 100* 94 99 -- -- -- -- -- -- -- --  BUN 15 10 10  -- -- -- -- -- -- -- --  CREATININE 0.57 0.56 0.61 -- -- -- -- -- -- -- --  CALCIUM 7.8* 7.7* 7.7* -- -- -- -- -- -- -- --  MG -- -- -- -- -- -- -- -- 2.3 -- --  PHOS -- -- -- -- -- -- -- -- 3.6 -- --  AST -- -- 76* -- -- -- -- -- -- 28 --  ALT -- -- 104* -- -- -- -- -- -- 15 --  ALKPHOS -- -- 41 -- -- -- -- -- -- 59 --  BILITOT -- -- 1.2 -- -- -- -- -- -- 1.3* --   PROT -- -- 5.5* -- -- -- -- -- -- 7.9 --  ALBUMIN -- -- 2.3* -- -- -- -- -- -- 3.7 --  APTT -- -- -- -- -- -- -- 37 -- -- --  INR -- -- -- -- -- -- -- 1.32 -- -- --  LATICACIDVEN -- -- -- -- -- -- -- -- -- 1.3 1.3  TROPONINI -- -- -- 0.49* -- 0.44* -- -- <0.30 -- --  PROCALCITON -- -- -- -- -- -- 0.11 -- -- -- --  PROBNP -- -- -- -- -- -- -- -- -- -- --  O2SATVEN -- -- -- -- -- -- -- -- -- -- --  PHART -- -- -- -- 7.436 -- -- -- 7.309* -- --  PCO2ART -- -- -- -- 28.8* -- -- -- 47.5* -- --  PO2ART -- -- -- -- 152.0* -- -- -- 315.0* -- --    Lab 07/11/12 0755 07/10/12 2001  GLUCAP 90 106*     Dg Chest Port 1 View  07/14/2012  *RADIOLOGY REPORT*  Clinical Data: Pneumonia  PORTABLE CHEST - 1 VIEW  Comparison: Multiple recent previous exams.  Findings: 0505 hours.  Endotracheal tube tip is 2.4 cm above the base of the carina.  NG tube is looped in the stomach with the tip overlying the fundus.  A left subclavian central line tip overlies the proximal SVC level.  Aeration at the lung bases is improved with some minimal residual atelectasis.  Small bilateral pleural effusions suspected. Cardiopericardial silhouette is at upper limits of normal for size. Telemetry leads overlie the chest.  IMPRESSION: Interval improvement in basilar aeration with probable small bilateral pleural effusions.   Original Report Authenticated By: Kennith Center, M.D.    Dg Chest Port 1 View  07/13/2012  *RADIOLOGY REPORT*  Clinical Data: Follow up pneumonia  PORTABLE CHEST - 1 VIEW  Comparison: CT chest dated 07/12/2012  Findings: Patchy bilateral lower lobe opacities, suspicious for pneumonia.  Moderate right and small left pleural effusions.  No pneumothorax.  The heart is top normal in size.  Endotracheal tube terminates 6 cm above the carina.  Left subclavian venous catheter terminates in the mid SVC.  Enteric tube courses below the diaphragm.  IMPRESSION: Patchy bilateral lower lobe opacities, suspicious for pneumonia.   Moderate right and small left pleural effusions.  Endotracheal tube  terminates 6 cm above the carina.   Original Report Authenticated By: Charline Bills, M.D.      1/30 EKG: NSR, LAFB, no ST wave changes  ASSESSMENT / PLAN:  PULMONARY A: 1) Acute respiratory failure in setting of complaints of two weeks of dysphagia and what sounds like difficulty maintaining secretions in upper airway;   Consider esophageal obstruction vs bulbar dysphagia (pseudonuclear palsy, myasthenia, vs other neuromuscular process). 2) Upper lobe airspace disease -favor aspiration P:   -SBTs but hold off extubation due to weakness & secretions -Have discussed with Neuro about empiric Rx for myasthenia while waiting on definitive results -daily NIF (-) -prn bronchodilators -await respiratory cultures  CARDIOVASCULAR A:  1) Cardiac arrest in what sounds like acute respiratory failure; had bradycardia likely due to profound hypoxemia;  Responded quickly with ~6 min CPR and epi x2.  EKG without arrythmia afterwards.  Favor respiratory cause of arrest. Mild trop bump d/t CPR.  -echo 1/31 - dialted RA/RV, septal dyssynergy, nml LVEF Suspect PE even though CT angio P:  -tele -ASA -hep drip  RENAL  Lab 07/14/12 0348 07/13/12 0356 07/12/12 0507  K 3.4* 3.2* 3.1*     A:   1) Hypokalemia - doubt periodic paralysis P:   -repleted, follow  GASTROINTESTINAL A:   1) Dysphagia >> inability to tolerate esophogram on 1/30; no lesion on CT neck 1/31 EGD gastritis and esophagitis but no mass or bleeding source P:   -OG in place (per nursing, passed easily) - neuro work up of dysphagia (MRI, myasthenia labs) -PPI bid -start TFs  HEMATOLOGIC A:   1) History of DVT, not on anticoagulation 1/31 DVT POS left femoral, popliteal, and posterior tibial veins Suspect PE  P:  -lV heparin  INFECTIOUS A:   1) Aspiration pneumonitis vs pneumonia P:   -unasyn and levaquin -f/u cultures  ENDOCRINE A:   1) no  acute issues P:   -monitor glucose  NEUROLOGIC A:  1)  Dysphagia of uncertain etiology 2)  CT head with R midbrain to L pons hypodensity of undetermined significance P:   - work up for neuromuscular cause (ie. MG) with acetylcholine receptor Ab (pending), father had MG  -? Empiric mastinon - Neuro to decide -fentanyl/propofol for sedation   GLOBAL -Jamai Boswell [name] [phone],  back up contact for father is: [name]: [phone] Updated pt & dad  TODAY'S SUMMARY:  His NIF is still too low to safely extubate. Would like to see NIF > -20 cm H2O and/or ability to tolerate SBT on T piece for > 30 mins.   I have personally obtained a history, examined the patient, evaluated laboratory and imaging results, formulated the assessment and plan and placed orders. CRITICAL CARE: The patient is critically ill with multiple organ systems failure and requires high complexity decision making for assessment and support, frequent evaluation and titration of therapies, application of advanced monitoring technologies and extensive interpretation of multiple databases. Critical Care Time devoted to patient care services described in this note is 30 minutes.    [name], MD ; [facility] [phone].  After 5:30 PM or weekends, call [phone]

## 2012-07-14 NOTE — Progress Notes (Signed)
ANTICOAGULATION CONSULT NOTE - Follow Up Consult  Pharmacy Consult for Heparin Indication: DVT  No Known Allergies  Patient Measurements: Height: 6' (182.9 cm) Weight: 203 lb 4.2 oz (92.2 kg) IBW/kg (Calculated) : 77.6  Heparin Dosing Weight: 89 kg  Vital Signs: Temp: 98.7 F (37.1 C) (02/03 0800) Temp src: Axillary (02/03 0800) BP: 120/64 mmHg (02/03 0810) Pulse Rate: 77  (02/03 0810)  Labs:  Basename 07/14/12 0348 07/13/12 1646 07/13/12 1051 07/13/12 0356 07/12/12 0507  HGB 12.1* -- -- 11.5* --  HCT 35.6* -- -- 34.7* 35.1*  PLT 122* -- -- 126* 122*  APTT -- -- -- -- --  LABPROT -- -- -- -- --  INR -- -- -- -- --  HEPARINUNFRC 0.24* 0.30 0.32 -- --  CREATININE 0.57 -- -- 0.56 0.61  CKTOTAL -- -- -- -- --  CKMB -- -- -- -- --  TROPONINI -- -- -- -- --    Estimated Creatinine Clearance: 113.2 ml/min (by C-G formula based on Cr of 0.57).    Assessment:  56 yom started on IV heparin on 1/31 for LLE DVT.  Third admission in 2 weeks for dysphagia, developed respiratory and cardiac arrest in ED 1/30.  Has hx DVT, not on anticoagulation PTA.  Day # 4 Heparin infusion.  Heparin level (0.38) is therapeutic.  CBC:  Stable Hgb, Plt are low/stable  No bleeding noted.  Goal of Therapy:  Heparin level 0.3-0.7 units/ml Monitor platelets by anticoagulation protocol: Yes   Plan:   Continue Heparin IV infusion at 1800 units/hr  Recheck HL in 6 hours to confirm therapeutic level.  Daily heparin level, CBC  Follow up long-term anticoagulation plan.  Lynann Beaver PharmD, BCPS Pager [phone] 07/14/2012 12:10 PM

## 2012-07-14 NOTE — Progress Notes (Signed)
Father would like psych eval to be done while pt is here. He contacted DSS before but was advised that they could not be of assistance unless a law had been broken. Stated his son has a strong paranoia and feels people are talking about him. This is the result of his "homeless" status.

## 2012-07-14 NOTE — Progress Notes (Deleted)
[id number] Earlene Plater, RN, BSN, CCM:  CHART REVIEWED AND UPDATED.  Next chart review due on [id number]. NO DISCHARGE NEEDS PRESENT AT THIS TIME.  Patient from [facility]. CASE MANAGEMENT [phone]

## 2012-07-14 NOTE — Progress Notes (Signed)
  ANTICOAGULATION CONSULT NOTE   Pharmacy Consult for IV heparin Indication: LLE DVT  No Known Allergies  Patient Measurements: Height: 6' (182.9 cm) Weight: 203 lb 4.2 oz (92.2 kg) IBW/kg (Calculated) : 77.6  Heparin Dosing Weight: 88.7kg  Vital Signs: Temp: 99 F (37.2 C) (02/03 0000) Temp src: Oral (02/03 0000) BP: 110/58 mmHg (02/03 0446) Pulse Rate: 62  (02/03 0446)  Labs:  Basename 07/14/12 0348 07/13/12 1646 07/13/12 1051 07/13/12 0356 07/12/12 0507 07/11/12 0630  HGB 12.1* -- -- 11.5* -- --  HCT 35.6* -- -- 34.7* 35.1* --  PLT 122* -- -- 126* 122* --  APTT -- -- -- -- -- --  LABPROT -- -- -- -- -- --  INR -- -- -- -- -- --  HEPARINUNFRC 0.24* 0.30 0.32 -- -- --  CREATININE 0.57 -- -- 0.56 0.61 --  CKTOTAL -- -- -- -- -- --  CKMB -- -- -- -- -- --  TROPONINI -- -- -- -- -- 0.49*    Estimated Creatinine Clearance: 113.2 ml/min (by C-G formula based on Cr of 0.57).   Medical History: Past Medical History  Diagnosis Date  . DVT (deep venous thrombosis)   . Hypertension   . High cholesterol     Assessment: 57 yo M started on IV heparin on 1/31 for LLE DVT.  Third admission in 2 weeks for dysphagia, developed respiratory and cardiac arrest in ED 1/30.  Has hx DVT, not on anticoagulation PTA.    AM heparin level is subtherapeutic (0.24) on 1650 units/hr.  Will increase rate and repeat level in 6 hr  No bleeding noted in chart notes  Goal of Therapy:  Heparin level 0.3-0.7 units/ml Monitor platelets by anticoagulation protocol: Yes   Plan:  1. Increase Heparin to 1800 units/hr  2.  Check heparin level 6 hrs after rate change 3.  Daily heparin levels & CBC  Terrilee Files, PharmD 07/14/2012 4:55 AM

## 2012-07-14 NOTE — Progress Notes (Signed)
ANTIBIOTIC CONSULT NOTE - FOLLOW UP  Pharmacy Consult for Levaquin, Unasyn Indication: pneumonia  No Known Allergies  Patient Measurements: Height: 6' (182.9 cm) Weight: 203 lb 4.2 oz (92.2 kg) IBW/kg (Calculated) : 77.6   Vital Signs: Temp: 98.2 F (36.8 C) (02/03 0400) Temp src: Oral (02/03 0400) BP: 101/60 mmHg (02/03 0600) Pulse Rate: 66  (02/03 0600)  Labs:  Basename 07/14/12 0348 07/13/12 0356 07/12/12 0507  WBC 8.1 10.4 9.5  HGB 12.1* 11.5* 11.6*  PLT 122* 126* 122*  LABCREA -- -- --  CREATININE 0.57 0.56 0.61   Estimated Creatinine Clearance: 113.2 ml/min (by C-G formula based on Cr of 0.57).  Microbiology: Recent Results (from the past 720 hour(s))  CULTURE, BLOOD (ROUTINE X 2)     Status: Normal (Preliminary result)   Collection Time   07/10/12 11:42 PM      Component Value Range Status Comment   Specimen Description BLOOD LH   Final    Special Requests     Final    Value: NONE BOTTLES DRAWN AEROBIC AND ANAEROBIC 2CC] BOTTLES DRAWN AEROBIC ONLY 2CC   Culture  Setup Time 07/11/2012 09:10   Final    Culture     Final    Value:        BLOOD CULTURE RECEIVED NO GROWTH TO DATE CULTURE WILL BE HELD FOR 5 DAYS BEFORE ISSUING A FINAL NEGATIVE REPORT   Report Status PENDING   Incomplete   CULTURE, BLOOD (ROUTINE X 2)     Status: Normal (Preliminary result)   Collection Time   07/10/12 11:47 PM      Component Value Range Status Comment   Specimen Description BLOOD LW   Final    Special Requests NONE BOTTLES DRAWN AEROBIC AND ANAEROBIC 3CC   Final    Culture  Setup Time 07/11/2012 09:10   Final    Culture     Final    Value:        BLOOD CULTURE RECEIVED NO GROWTH TO DATE CULTURE WILL BE HELD FOR 5 DAYS BEFORE ISSUING A FINAL NEGATIVE REPORT   Report Status PENDING   Incomplete   MRSA PCR SCREENING     Status: Normal   Collection Time   07/11/12  3:35 AM      Component Value Range Status Comment   MRSA by PCR NEGATIVE  NEGATIVE Final   URINE CULTURE     Status:  Normal   Collection Time   07/11/12  6:09 AM      Component Value Range Status Comment   Specimen Description URINE, CATHETERIZED   Final    Special Requests NONE   Final    Culture  Setup Time 07/11/2012 09:18   Final    Colony Count NO GROWTH   Final    Culture NO GROWTH   Final    Report Status 07/12/2012 FINAL   Final   CULTURE, BLOOD (ROUTINE X 2)     Status: Normal (Preliminary result)   Collection Time   07/12/12  1:50 AM      Component Value Range Status Comment   Specimen Description BLOOD LEFT HAND   Final    Special Requests BOTTLES DRAWN AEROBIC AND ANAEROBIC 2.5ML   Final    Culture  Setup Time 07/12/2012 16:20   Final    Culture     Final    Value:        BLOOD CULTURE RECEIVED NO GROWTH TO DATE CULTURE WILL BE HELD FOR  5 DAYS BEFORE ISSUING A FINAL NEGATIVE REPORT   Report Status PENDING   Incomplete   CULTURE, BLOOD (ROUTINE X 2)     Status: Normal (Preliminary result)   Collection Time   07/12/12  2:00 AM      Component Value Range Status Comment   Specimen Description BLOOD LEFT ARM   Final    Special Requests BOTTLES DRAWN AEROBIC AND ANAEROBIC   Final    Culture  Setup Time 07/12/2012 16:20   Final    Culture     Final    Value:        BLOOD CULTURE RECEIVED NO GROWTH TO DATE CULTURE WILL BE HELD FOR 5 DAYS BEFORE ISSUING A FINAL NEGATIVE REPORT   Report Status PENDING   Incomplete     Anti-infectives     Start     Dose/Rate Route Frequency Ordered Stop   07/11/12 0100   Ampicillin-Sulbactam (UNASYN) 3 g in sodium chloride 0.9 % 100 mL IVPB        3 g 100 mL/hr over 60 Minutes Intravenous 4 times per day 07/10/12 2353     07/11/12 0030   levofloxacin (LEVAQUIN) IVPB 750 mg        750 mg 100 mL/hr over 90 Minutes Intravenous Daily at bedtime 07/10/12 2353            Assessment:  56 yom started on antibiotics for aspiration pneumonia.  Pt had cardiac and respiratory arrest after a neck CT 1/30 to evaluate dysphagia.  Day #4 Levaquin and  Unasyn  Renal function is stable, CrCl > 100 ml/min  Blood and urine cultures remain negative  Goal of Therapy:  Appropriate abx dosing, eradication of infection.  Plan:   Continue Levaquin 750mg  IV q24h  Continue Unasyn 3g IV q6h  Follow up renal function and cultures as available.  Lynann Beaver PharmD, BCPS Pager [phone] 07/14/2012 8:48 AM

## 2012-07-14 NOTE — Progress Notes (Signed)
eLink Physician-Brief Progress Note Patient Name: Victor Durham DOB: [birth year] MRN: [id number]  Date of Service  07/14/2012   HPI/Events of Note   Hypokalemia  eICU Interventions  Potassium replaced   Intervention Category Intermediate Interventions: Electrolyte abnormality - evaluation and management  DETERDING,ELIZABETH 07/14/2012, 4:31 AM

## 2012-07-14 NOTE — Plan of Care (Signed)
Problem: Phase I Progression Outcomes Goal: Code status addressed with pt/family Outcome: Completed/Met Date Met:  07/14/12 Father would like pt to get better, he requested that we have a Psychiatrist see him when he is better.  He has never been diagnosed, but Father believes he has "something wrong with his head."  No mental health work up has been done in past per father. Goal: Patient tolerating nututrition at goal Outcome: Progressing Pt tolerating advancement of TF.  Low residuals. Goal: Progressing towards optiumm acitivities Outcome: Not Progressing Very weak, has difficulty keeping eyes open.  Pt remains on bedrest, turning Q 2hrs. Goal: Patient tolerating weaning plan Outcome: Progressing Pt tolerated approx 6 hrs CPAP 12/5 today.  Pt communicated that he was tired, and that he was in pain.  Fentanyl IV given and pt returned to Vent support. Goal: Optimized method of communication. Outcome: Completed/Met Date Met:  07/14/12 Pt clearly able to communicate needs via nods and writing on note pad.

## 2012-07-14 NOTE — Progress Notes (Signed)
[id number]/Rhonda Davis, RN, BSN, CCM:  CHART REVIEWED AND UPDATED.  Next chart review due on [id number]. NO DISCHARGE NEEDS PRESENT AT THIS TIME. CASE MANAGEMENT [phone]  

## 2012-07-15 ENCOUNTER — Telehealth: Payer: Self-pay | Admitting: Pulmonary Disease

## 2012-07-15 ENCOUNTER — Inpatient Hospital Stay (HOSPITAL_COMMUNITY): Payer: Medicaid Other

## 2012-07-15 DIAGNOSIS — I82409 Acute embolism and thrombosis of unspecified deep veins of unspecified lower extremity: Secondary | ICD-10-CM

## 2012-07-15 DIAGNOSIS — Z86718 Personal history of other venous thrombosis and embolism: Secondary | ICD-10-CM

## 2012-07-15 DIAGNOSIS — G7000 Myasthenia gravis without (acute) exacerbation: Secondary | ICD-10-CM

## 2012-07-15 DIAGNOSIS — G709 Myoneural disorder, unspecified: Secondary | ICD-10-CM

## 2012-07-15 LAB — BASIC METABOLIC PANEL
CO2: 23 mEq/L (ref 19–32)
Calcium: 7.8 mg/dL — ABNORMAL LOW (ref 8.4–10.5)
Chloride: 104 mEq/L (ref 96–112)
Glucose, Bld: 124 mg/dL — ABNORMAL HIGH (ref 70–99)
Potassium: 3.5 mEq/L (ref 3.5–5.1)
Sodium: 134 mEq/L — ABNORMAL LOW (ref 135–145)

## 2012-07-15 LAB — CBC
Hemoglobin: 11.6 g/dL — ABNORMAL LOW (ref 13.0–17.0)
MCH: 29.7 pg (ref 26.0–34.0)
RBC: 3.9 MIL/uL — ABNORMAL LOW (ref 4.22–5.81)
WBC: 4.5 10*3/uL (ref 4.0–10.5)

## 2012-07-15 LAB — CULTURE, RESPIRATORY W GRAM STAIN

## 2012-07-15 LAB — MAGNESIUM: Magnesium: 2 mg/dL (ref 1.5–2.5)

## 2012-07-15 LAB — PHOSPHORUS: Phosphorus: 1.9 mg/dL — ABNORMAL LOW (ref 2.3–4.6)

## 2012-07-15 MED ORDER — GADOBENATE DIMEGLUMINE 529 MG/ML IV SOLN
19.0000 mL | Freq: Once | INTRAVENOUS | Status: AC | PRN
  Administered 2012-07-15: 19 mL via INTRAVENOUS

## 2012-07-15 MED ORDER — POTASSIUM CHLORIDE 20 MEQ/15ML (10%) PO LIQD
40.0000 meq | Freq: Four times a day (QID) | ORAL | Status: AC
  Administered 2012-07-15: 40 meq
  Filled 2012-07-15: qty 30

## 2012-07-15 MED ORDER — PANTOPRAZOLE SODIUM 40 MG PO PACK
40.0000 mg | PACK | Freq: Every day | ORAL | Status: DC
  Administered 2012-07-16 – 2012-07-19 (×4): 40 mg
  Filled 2012-07-15 (×7): qty 20

## 2012-07-15 MED ORDER — ENOXAPARIN SODIUM 100 MG/ML ~~LOC~~ SOLN
90.0000 mg | Freq: Two times a day (BID) | SUBCUTANEOUS | Status: DC
  Administered 2012-07-15: 90 mg via SUBCUTANEOUS
  Administered 2012-07-16: via SUBCUTANEOUS
  Filled 2012-07-15 (×4): qty 1

## 2012-07-15 MED ORDER — FENTANYL CITRATE 0.05 MG/ML IJ SOLN
50.0000 ug | INTRAMUSCULAR | Status: DC | PRN
  Administered 2012-07-15 (×2): 50 ug via INTRAVENOUS
  Administered 2012-07-16: 100 ug via INTRAVENOUS
  Administered 2012-07-17 – 2012-07-18 (×2): 50 ug via INTRAVENOUS
  Administered 2012-07-21: 100 ug via INTRAVENOUS
  Filled 2012-07-15 (×2): qty 2
  Filled 2012-07-15: qty 4
  Filled 2012-07-15 (×3): qty 2

## 2012-07-15 MED ORDER — HEPARIN (PORCINE) IN NACL 100-0.45 UNIT/ML-% IJ SOLN
2000.0000 [IU]/h | INTRAMUSCULAR | Status: DC
  Administered 2012-07-15: 2000 [IU]/h via INTRAVENOUS
  Filled 2012-07-15: qty 250

## 2012-07-15 MED ORDER — FUROSEMIDE 10 MG/ML IJ SOLN
40.0000 mg | Freq: Four times a day (QID) | INTRAMUSCULAR | Status: AC
  Administered 2012-07-15 (×2): 40 mg via INTRAVENOUS
  Filled 2012-07-15 (×2): qty 4

## 2012-07-15 MED ORDER — PYRIDOSTIGMINE BROMIDE 60 MG PO TABS
30.0000 mg | ORAL_TABLET | Freq: Four times a day (QID) | ORAL | Status: DC
  Administered 2012-07-15 (×2): 30 mg via ORAL
  Administered 2012-07-15: 22:00:00 via ORAL
  Administered 2012-07-16 – 2012-07-17 (×8): 30 mg via ORAL
  Administered 2012-07-18: 09:00:00 via ORAL
  Administered 2012-07-18 – 2012-07-19 (×5): 30 mg via ORAL
  Filled 2012-07-15 (×20): qty 0.5

## 2012-07-15 MED ORDER — MIDAZOLAM HCL 2 MG/2ML IJ SOLN
2.0000 mg | INTRAMUSCULAR | Status: DC | PRN
  Administered 2012-07-15 – 2012-07-17 (×3): 2 mg via INTRAVENOUS
  Filled 2012-07-15: qty 1
  Filled 2012-07-15: qty 2
  Filled 2012-07-15: qty 1

## 2012-07-15 MED ORDER — POTASSIUM CHLORIDE 20 MEQ/15ML (10%) PO LIQD
40.0000 meq | Freq: Once | ORAL | Status: AC
  Administered 2012-07-15: 40 meq
  Filled 2012-07-15: qty 30

## 2012-07-15 MED ORDER — SODIUM PHOSPHATE 3 MMOLE/ML IV SOLN
30.0000 mmol | Freq: Once | INTRAVENOUS | Status: AC
  Administered 2012-07-15: 30 mmol via INTRAVENOUS
  Filled 2012-07-15: qty 10

## 2012-07-15 NOTE — Progress Notes (Addendum)
PULMONARY  / CRITICAL CARE MEDICINE  Name: IZEA LIVOLSI MRN: [id number] DOB: 1956-03-07    ADMISSION DATE:  07/10/2012 CONSULTATION DATE:  1/30/20143  REFERRING MD :  Juleen China  CHIEF COMPLAINT:  Dysphagia  BRIEF PATIENT DESCRIPTION: 57 y/o homeless male presented to the [facility] ED On 1/30 for the third time in two weeks for evaluation of dysphagia.  He complained of some difficulty breathing and underwent a neck CT.  Shortly after that procedure he developed respiratory and cardiac arrest.  PCCM called for admission.  SIGNIFICANT EVENTS / STUDIES:  1/30 respiratory and cardiac arrest in ED 1/30 CT head: hypoattenuation R midbrain extending into L pons, poorly characterized due to streak artifact 130 Echocardiogram: LVEF normal. Mild to moderate RV dilatation 1/31 EGD gastritis and esophagitis but no mass or bleeding source 1/31 LE venous duplex scans: LLE DVT 1/31 Thick secretions -plugged ETT during MRI 1/31 MRI -Scattered subcortical T2 hyperintensities -nonspecific, chronic microvascular ischemia vs a demyelinating process  2/1 Neurology consult: concern for myasthenia gravis 2/1 CT chest - no thymoma or central PE, Bilateral lower lobe consolidation and atelectasis , Nonspecific enhancing solid 4.1 cm mass exophytic from segment 2 of the left hepatic lobe  2/2 Mestinon started - intolerant due to excessive secretions 2/3 IVIG initiated by Neuro 2/4 Acetylcholine Receptor Ab 127.00 (<=0.30 mmol/L) 2/4 MRI cervical spine >>  2/4 Mestinon retried  LINES / TUBES: 1/30 ETT >>  1/30 L [place] CVL >>   CULTURES: 1/30 blood >> NEG 1/30 urine >> NEG 2-2 sputum>> NEG 2-1 bc x 2 >> NEG  ANTIBIOTICS: unasyn 1/30 >> 2/4 levaquin 1/30 >> 2/4  SUBJECTIVE:  Appears anxious, denies pain Tolerates PS 12 cm H2O  VITAL SIGNS: Temp:  [97.8 F (36.6 C)-99.2 F (37.3 C)] 99.2 F (37.3 C) (02/04 0800) Pulse Rate:  [62-83] 63  (02/04 0700) Resp:  [13-21] 20  (02/04 0700) BP:  (95-125)/(50-84) 97/65 mmHg (02/04 0700) SpO2:  [89 %-100 %] 97 % (02/04 0700) FiO2 (%):  [40 %-50 %] 40 % (02/04 0815) HEMODYNAMICS: CVP:  [7 mmHg] 7 mmHg VENTILATOR SETTINGS: Vent Mode:  [-] PSV;PRVC;CPAP FiO2 (%):  [40 %-50 %] 40 % Set Rate:  [20 bmp] 20 bmp Vt Set:  [620 mL] 620 mL PEEP:  [5 cmH20] 5 cmH20 Pressure Support:  [12 cmH20-17 cmH20] 12 cmH20 Plateau Pressure:  [16 cmH20-19 cmH20] 19 cmH20 INTAKE / OUTPUT: Intake/Output      02/03 0701 - 02/04 0700 02/04 0701 - 02/05 0700   I.V. (mL/kg) 2060.4 (22.3)    NG/GT 1220    IV Piggyback 650    Total Intake(mL/kg) 3930.4 (42.6)    Urine (mL/kg/hr) 525 (0.2) 600   Total Output 525 600   Net +3405.4 -600          PHYSICAL EXAMINATION: Gen: RASS 0, NAD HEENT: + lid lag, o/w WNL PULM: diminished on R, no wheeze CV: RRR, no mgr, no JVD AB: Soft, NT, NABS Ext: 3+ symmetric pitting edema Neuro: diffusely weak   LABS:  Lab 07/15/12 0346 07/14/12 0348 07/13/12 0356 07/12/12 0507 07/11/12 0630 07/11/12 0452 07/11/12 0158 07/10/12 2342 07/10/12 2310 07/10/12 2011 07/10/12 1645 07/10/12 0025  HGB 11.6* 12.1* 11.5* -- -- -- -- -- -- -- -- --  WBC 4.5 8.1 10.4 -- -- -- -- -- -- -- -- --  PLT 136* 122* 126* -- -- -- -- -- -- -- -- --  NA 134* 139 139 -- -- -- -- -- -- -- -- --  K 3.5 3.4* -- -- -- -- -- -- -- -- -- --  CL 104 106 106 -- -- -- -- -- -- -- -- --  CO2 23 24 20  -- -- -- -- -- -- -- -- --  GLUCOSE 124* 100* 94 -- -- -- -- -- -- -- -- --  BUN 14 15 10  -- -- -- -- -- -- -- -- --  CREATININE 0.52 0.57 0.56 -- -- -- -- -- -- -- -- --  CALCIUM 7.8* 7.8* 7.7* -- -- -- -- -- -- -- -- --  MG 2.0 -- -- -- -- -- -- -- -- 2.3 -- --  PHOS 1.9* -- -- -- -- -- -- -- -- 3.6 -- --  AST -- -- -- 76* -- -- -- -- -- -- 28 --  ALT -- -- -- 104* -- -- -- -- -- -- 15 --  ALKPHOS -- -- -- 41 -- -- -- -- -- -- 59 --  BILITOT -- -- -- 1.2 -- -- -- -- -- -- 1.3* --  PROT -- -- -- 5.5* -- -- -- -- -- -- 7.9 --  ALBUMIN -- -- -- 2.3*  -- -- -- -- -- -- 3.7 --  APTT -- -- -- -- -- -- -- -- 37 -- -- --  INR -- -- -- -- -- -- -- -- 1.32 -- -- --  LATICACIDVEN -- -- -- -- -- -- -- -- -- -- 1.3 1.3  TROPONINI -- -- -- -- 0.49* -- 0.44* -- -- <0.30 -- --  PROCALCITON -- -- -- -- -- -- -- 0.11 -- -- -- --  PROBNP -- -- -- -- -- -- -- -- -- -- -- --  O2SATVEN -- -- -- -- -- -- -- -- -- -- -- --  PHART -- -- -- -- -- 7.436 -- -- -- 7.309* -- --  PCO2ART -- -- -- -- -- 28.8* -- -- -- 47.5* -- --  PO2ART -- -- -- -- -- 152.0* -- -- -- 315.0* -- --    Lab 07/11/12 0755 07/10/12 2001  GLUCAP 90 106*    CXR: R>L LL atx/effusion   1/30 EKG: NSR, LAFB, no ST wave changes  ASSESSMENT / PLAN:  PULMONARY A: 1) Acute respiratory failure due to NM dz  (myesthenia)  2) Upper lobe airspace disease, resolved 3) Probable edema with effusions P:   --Cont vent support and daily SBT  Needs to tolerate T piece and/or have NIF > -20 cm H2O before extubation can be entertained - Diuresis and monitor CXR - D/C abx  CARDIOVASCULAR A:  1) Cardiac arrest secondary to acute respiratory failure/hypoxemia 2) LLE DVT P:  -Cont Tele  RENAL A:   1) Volume overload 2) Hypokalemia -  3) Hypophosphatemia P:   - diuresis to extent permitted by BP and renal function - replete electrolytes as indicated   GASTROINTESTINAL A:   1) Dysphagia due to MG  P:   -Cont TFs -Swallow eval after extubation  HEMATOLOGIC A:  Mild anemia, NOS  Mild thrombocytopenia  DVT  P:  -Change to LMWH -Monitor CBC  INFECTIOUS A:   1) No evidence of active infectious process P:   -D/C abx  ENDOCRINE A:   1) mild hyperglycemia P:   -monitor glucose -initiate SSI if CBGs > 180   NEUROLOGIC A:  1)  Myasthenia Gravis 2)  CT head and MRI abnormalities of uncertain significance P:   - IVIG per neuro -Mestinon resumed 2/4 -  Neuro managed   GLOBAL Rathke Pease [name] [phone],  back up contact for father is: [name]:  [phone] Updated pt & dad  TODAY'S SUMMARY:  His NIF is still too low to safely extubate -10 on 2/4. Would like to see NIF > -20 cm H2O and/or ability to tolerate SBT on T piece for > 30 mins.  Kandice Robinsons, RN ACNP Student [employer] for Devra Dopp, ACNP Brett Canales Minor ACNP Adolph Pollack PCCM Pager [phone] till 3 pm If no answer page [phone] 07/15/2012, 9:52 AM   I have interviewed and examined the patient and reviewed the database. I have formulated the assessment and plan as reflected in the note above with amendments made by me. 30 mins of direct critical care time provided  [name], MD;  PCCM service; Mobile [phone]    I have personally obtained a history, examined the patient, evaluated laboratory and imaging results, formulated the assessment and plan and placed orders. CRITICAL CARE: The patient is critically ill with multiple organ systems failure and requires high complexity decision making for assessment and support, frequent evaluation and titration of therapies, application of advanced monitoring technologies and extensive interpretation of multiple databases. Critical Care Time devoted to patient care services described in this note is     minutes.

## 2012-07-15 NOTE — Progress Notes (Signed)
NEURO HOSPITALIST PROGRESS NOTE   SUBJECTIVE:                                                                                                                        Mestinon stopped and oral secretions have decreased significantly. Received one dose of IVIG with no appear ant improvement. Now unable to open bilateral upper eyelids. Acetylcholine ABx positive for myasthenia.   OBJECTIVE:                                                                                                                           Vital signs in last 24 hours: Temp:  [97.8 F (36.6 C)-99.2 F (37.3 C)] 99.2 F (37.3 C) (02/04 0800) Pulse Rate:  [62-83] 63  (02/04 0700) Resp:  [13-21] 20  (02/04 0700) BP: (95-125)/(50-84) 97/65 mmHg (02/04 0700) SpO2:  [89 %-100 %] 97 % (02/04 0700) FiO2 (%):  [40 %-50 %] 40 % (02/04 0815)  Intake/Output from previous day: 02/03 0701 - 02/04 0700 In: 3930.4 [I.V.:2060.4; NG/GT:1220; IV Piggyback:650] Out: 525 [Urine:525] Intake/Output this shift: Total I/O In: -  Out: 600 [Urine:600] Nutritional status:    Past Medical History  Diagnosis Date  . DVT (deep venous thrombosis)   . Hypertension   . High cholesterol      Neurologic Exam:  Mental Status: Drowsy, able to follow verbal commands, intubated but breathing over vent.  Cranial Nerves: II: visual fields grossly normal, pupils equal, round, reactive to light and accommodation III,IV, VI: unable to open both eyelids for me--makes hand gestures stating he is unale. , extraocular muscles horizontal gaze full bilaterally, decreased ability to look down with left eye and decreased ability to look up bilaterally.  V,VII: face symmetric, facial light touch sensation normal bilaterally VIII: hearing normal bilaterally IX,X: gag reflex present XI: weak XII: unable  Motor: Right : Upper extremity    Left:     Upper extremity 0/5 deltoid       0/5 deltoid 4/5 tricep      4/5  tricep 5/5 biceps      5/5 biceps  5/5wrist flexion     5/5 wrist flexion 5/5 wrist extension     5/5 wrist extension 5/5 hand grip  5/5 hand grip  Lower extremity     Lower extremity 5/5 hip flexor      5/5 hip flexor 5/5 hip adductors     5/5 hip adductors 5/5 hip abductors     5/5 hip abductors 5/5 quadricep      5/5 quadriceps  5/5 hamstrings     5/5 hamstrings 5/5 plantar flexion       5/5 plantar flexion 5/5 plantar extension     5/5 plantar extension Tone and bulk:normal tone throughout; no atrophy noted Sensory: Pinprick and light touch intact throughout, bilaterally Deep Tendon Reflexes:  Right: Upper Extremity   Left: Upper extremity   biceps (C-5 to C-6) 2/4   biceps (C-5 to C-6) 2/4 tricep (C7) 2/4    triceps (C7) 2/4 Brachioradialis (C6) 2/4  Brachioradialis (C6) 2/4  Lower Extremity Lower Extremity  quadriceps (L-2 to L-4) 2/4   quadriceps (L-2 to L-4) 2/4 Achilles (S1) 2/4   Achilles (S1) 2/4 Plantars: Right: downgoing   Left: downgoing Cerebellar: normal finger-to-nose,   Lab Results: Lab Results  Component Value Date/Time   CHOL 223* 06/27/2009  8:22 PM   Lipid Panel  Basename 07/13/12 0356  CHOL --  TRIG 66  HDL --  CHOLHDL --  VLDL --  LDLCALC --    Studies/Results: Dg Chest Port 1 View  07/15/2012  *RADIOLOGY REPORT*  Clinical Data: Effusions, vent  PORTABLE CHEST - 1 VIEW  Comparison: 07/14/2012  Findings: Endotracheal tube terminates 3 cm above the carina.  Moderate right and small left pleural effusions, increased. Associated lower lobe opacities, likely atelectasis. No pneumothorax.  Stable left subclavian venous catheter and enteric tube coursing below the diaphragm.  IMPRESSION: Endotracheal tube terminates 3 cm above the carina.  Moderate right and small left pleural effusions, increased.   Original Report Authenticated By: Charline Bills, M.D.    Dg Chest Port 1 View  07/14/2012  *RADIOLOGY REPORT*  Clinical Data: Pneumonia  PORTABLE  CHEST - 1 VIEW  Comparison: Multiple recent previous exams.  Findings: 0505 hours.  Endotracheal tube tip is 2.4 cm above the base of the carina.  NG tube is looped in the stomach with the tip overlying the fundus.  A left subclavian central line tip overlies the proximal SVC level.  Aeration at the lung bases is improved with some minimal residual atelectasis.  Small bilateral pleural effusions suspected. Cardiopericardial silhouette is at upper limits of normal for size. Telemetry leads overlie the chest.  IMPRESSION: Interval improvement in basilar aeration with probable small bilateral pleural effusions.   Original Report Authenticated By: Kennith Center, M.D.     MEDICATIONS                                                                                                                        Scheduled:   . ampicillin-sulbactam (UNASYN) IV  3 g Intravenous Q6H  . antiseptic oral rinse  15 mL Mouth Rinse QID  . chlorhexidine  15 mL Mouth  Rinse BID  . Immune Globulin 5%  400 mg/kg Intravenous Q24 Hr x 5  . levofloxacin (LEVAQUIN) IV  750 mg Intravenous QHS  . pantoprazole sodium  40 mg Per Tube BID  . sodium phosphate  Dextrose 5% IVPB  30 mmol Intravenous Once    ASSESSMENT/PLAN:                                                                                                               Patient Active Hospital Problem List:  Neuromuscular disorder (07/14/2012)   Assessment: Continues to have bilateral ptosis and seems to have more difficulty opening his eyes.  Continues to have bilateral proximal shoulder weakness and trapezial weakness.  He is breathing over the vent.  His secretions have improved since the Mestinon was D/C'd.  He has no received 1/5 IVIG. Remains of Fentanyl.    Plan:  1) Continue IVIG today  Will receive 2/5 treatments--if no better in a few days may consider PLX 2) MRI C-spine with and without contrast 3) Striated muscle antibody pending but Acetylcholine Abx positive  (127).  Will restart Mestinon but at a lower dose 30 mg QID and watch oral secretions closely.   Assessment and plan discussed with with attending physician Dr Leroy Kennedy and he is in agreement.    Felicie Morn PA-C [facility] [phone]  07/15/2012, 8:24 AM

## 2012-07-15 NOTE — Progress Notes (Signed)
ANTICOAGULATION CONSULT NOTE - Follow Up Consult  Pharmacy Consult for Lovenox Indication: DVT  No Known Allergies  Patient Measurements: Height: 6' (182.9 cm) Weight: 203 lb 4.2 oz (92.2 kg) IBW/kg (Calculated) : 77.6   Vital Signs: Temp: 99.1 F (37.3 C) (02/04 1130) Temp src: Oral (02/04 1130) BP: 128/79 mmHg (02/04 1130) Pulse Rate: 86  (02/04 1130)  Labs:  Basename 07/15/12 0346 07/14/12 1811 07/14/12 1159 07/14/12 0348 07/13/12 0356  HGB 11.6* -- -- 12.1* --  HCT 34.8* -- -- 35.6* 34.7*  PLT 136* -- -- 122* 126*  APTT -- -- -- -- --  LABPROT -- -- -- -- --  INR -- -- -- -- --  HEPARINUNFRC 0.28* 0.32 0.38 -- --  CREATININE 0.52 -- -- 0.57 0.56  CKTOTAL -- -- -- -- --  CKMB -- -- -- -- --  TROPONINI -- -- -- -- --    Estimated Creatinine Clearance: 113.2 ml/min (by C-G formula based on Cr of 0.52).    Assessment:  56 yom started on IV heparin on 1/31 for LLE DVT.  Third admission in 2 weeks for dysphagia, developed respiratory and cardiac arrest in ED 1/30.  Has hx DVT, not on anticoagulation PTA.  Day # 5 Heparin infusion.  Pharmacy asked to change to Lovenox dosing 07/15/12.  SCr is stable/wnl, CrCl > 100 ml/min.  CBC:  Stable Hgb, Plt are low/stable  No bleeding noted.  Goal of Therapy:  Heparin level 0.3-0.7 units/ml Monitor platelets by anticoagulation protocol: Yes   Plan:   Discontinue Heparin IV infusion  Lovenox 90mg  SQ q12h, give one hour after heparin d/c  Daily CBC, weekly SCr while on Lovneox  Follow up long-term anticoagulation plan.  Lynann Beaver PharmD, BCPS Pager [phone] 07/15/2012 11:40 AM

## 2012-07-15 NOTE — Progress Notes (Signed)
eLink Physician-Brief Progress Note Patient Name: Victor Durham DOB: [birth year] MRN: [id number]  Date of Service  07/15/2012   HPI/Events of Note   Hypophosphatemia  eICU Interventions  Phos replaced   Intervention Category Intermediate Interventions: Electrolyte abnormality - evaluation and management  Victor Durham 07/15/2012, 4:35 AM

## 2012-07-15 NOTE — Progress Notes (Signed)
NIF -10

## 2012-07-15 NOTE — Telephone Encounter (Signed)
Spoke with patients father.  Would like to know the status of patient in hospital bed 2107.  I have informed patient that it would be best that he go to unit and wait for attending to round on his son and/or inform nursing staff on that floor that he would like an update on sons progression for MD.  Patient also stated that he has left number for Dr. Sung Amabile with nurse on floor and will await phone call.  Nothing further needed at this time.

## 2012-07-15 NOTE — Progress Notes (Signed)
ANTICOAGULATION CONSULT NOTE - Follow Up Consult  Pharmacy Consult for Heparin Indication: DVT  No Known Allergies  Patient Measurements: Height: 6' (182.9 cm) Weight: 203 lb 4.2 oz (92.2 kg) IBW/kg (Calculated) : 77.6  Heparin Dosing Weight: 89 kg  Vital Signs: Temp: 98.1 F (36.7 C) (02/04 0000) Temp src: Axillary (02/04 0000) BP: 99/63 mmHg (02/04 0304) Pulse Rate: 68  (02/04 0304)  Labs:  Basename 07/15/12 0346 07/14/12 1811 07/14/12 1159 07/14/12 0348 07/13/12 0356  HGB 11.6* -- -- 12.1* --  HCT 34.8* -- -- 35.6* 34.7*  PLT 136* -- -- 122* 126*  APTT -- -- -- -- --  LABPROT -- -- -- -- --  INR -- -- -- -- --  HEPARINUNFRC 0.28* 0.32 0.38 -- --  CREATININE 0.52 -- -- 0.57 0.56  CKTOTAL -- -- -- -- --  CKMB -- -- -- -- --  TROPONINI -- -- -- -- --    Estimated Creatinine Clearance: 113.2 ml/min (by C-G formula based on Cr of 0.52).   Assessment:  56 yom started on IV heparin on 1/31 for LLE DVT.  Third admission in 2 weeks for dysphagia, developed respiratory and cardiac arrest in ED 1/30.  Has hx DVT, not on anticoagulation PTA.  Day # 5 Heparin infusion  CBC:  Stable Hgb, Plt are low/stable  No bleeding/IV interuptions per RN.  Goal of Therapy:  Heparin level 0.3-0.7 units/ml Monitor platelets by anticoagulation protocol: Yes   Plan:   Increase Heparin IV infusion to 2000 units/hr  Recheck HL in 6 hours  Daily heparin level, CBC  Follow up long-term anticoagulation plan.  Lorenza Evangelist 07/15/2012 5:30 AM Pharmacy #: [id number]

## 2012-07-16 ENCOUNTER — Inpatient Hospital Stay (HOSPITAL_COMMUNITY): Payer: Medicaid Other

## 2012-07-16 LAB — MRSA PCR SCREENING: MRSA by PCR: NEGATIVE

## 2012-07-16 LAB — GLUCOSE, CAPILLARY
Glucose-Capillary: 116 mg/dL — ABNORMAL HIGH (ref 70–99)
Glucose-Capillary: 116 mg/dL — ABNORMAL HIGH (ref 70–99)

## 2012-07-16 LAB — CBC
Hemoglobin: 10.8 g/dL — ABNORMAL LOW (ref 13.0–17.0)
Platelets: 137 10*3/uL — ABNORMAL LOW (ref 150–400)
RBC: 3.65 MIL/uL — ABNORMAL LOW (ref 4.22–5.81)
WBC: 4 10*3/uL (ref 4.0–10.5)

## 2012-07-16 LAB — BASIC METABOLIC PANEL
CO2: 29 mEq/L (ref 19–32)
Glucose, Bld: 114 mg/dL — ABNORMAL HIGH (ref 70–99)
Potassium: 3.6 mEq/L (ref 3.5–5.1)
Sodium: 132 mEq/L — ABNORMAL LOW (ref 135–145)

## 2012-07-16 LAB — URINALYSIS, ROUTINE W REFLEX MICROSCOPIC
Glucose, UA: 250 mg/dL — AB
Leukocytes, UA: NEGATIVE
pH: 8.5 — ABNORMAL HIGH (ref 5.0–8.0)

## 2012-07-16 MED ORDER — CALCIUM GLUCONATE 10 % IV SOLN
2.0000 g | INTRAVENOUS | Status: DC | PRN
  Filled 2012-07-16: qty 20

## 2012-07-16 MED ORDER — ACD FORMULA A 0.73-2.45-2.2 GM/100ML VI SOLN
500.0000 mL | Status: DC
  Administered 2012-07-17 – 2012-07-25 (×2): 500 mL via INTRAVENOUS
  Filled 2012-07-16 (×5): qty 500

## 2012-07-16 MED ORDER — ACD FORMULA A 0.73-2.45-2.2 GM/100ML VI SOLN
500.0000 mL | Status: DC
  Filled 2012-07-16: qty 500

## 2012-07-16 MED ORDER — HEPARIN SOD (PORK) LOCK FLUSH 100 UNIT/ML IV SOLN
500.0000 [IU] | Freq: Once | INTRAVENOUS | Status: DC
  Filled 2012-07-16: qty 5

## 2012-07-16 MED ORDER — ACETAMINOPHEN 325 MG PO TABS: 650.0000 mg | ORAL_TABLET | ORAL | Status: DC | PRN

## 2012-07-16 MED ORDER — POTASSIUM CHLORIDE 20 MEQ/15ML (10%) PO LIQD
40.0000 meq | Freq: Once | ORAL | Status: AC
  Administered 2012-07-16: 40 meq
  Filled 2012-07-16: qty 30

## 2012-07-16 MED ORDER — SODIUM CHLORIDE 0.9 % IV SOLN
INTRAVENOUS | Status: AC
  Administered 2012-07-17 (×4): via INTRAVENOUS_CENTRAL
  Filled 2012-07-16 (×4): qty 200

## 2012-07-16 MED ORDER — CALCIUM CARBONATE ANTACID 500 MG PO CHEW
2.0000 | CHEWABLE_TABLET | ORAL | Status: DC | PRN
  Filled 2012-07-16: qty 2

## 2012-07-16 MED ORDER — HEPARIN SODIUM (PORCINE) 1000 UNIT/ML IJ SOLN
1000.0000 [IU] | Freq: Once | INTRAMUSCULAR | Status: DC
  Filled 2012-07-16: qty 3

## 2012-07-16 MED ORDER — HEPARIN SODIUM (PORCINE) 1000 UNIT/ML IJ SOLN
2400.0000 [IU] | Freq: Once | INTRAMUSCULAR | Status: AC
  Administered 2012-07-16: 2400 [IU] via INTRAVENOUS
  Filled 2012-07-16: qty 2.4

## 2012-07-16 MED ORDER — SODIUM CHLORIDE 0.9 % IV SOLN
Freq: Once | INTRAVENOUS | Status: DC
  Filled 2012-07-16: qty 200

## 2012-07-16 MED ORDER — SODIUM CHLORIDE 0.9 % IV SOLN
2.0000 g | INTRAVENOUS | Status: DC | PRN
  Filled 2012-07-16: qty 20

## 2012-07-16 MED ORDER — HEPARIN SODIUM (PORCINE) 1000 UNIT/ML IJ SOLN
1000.0000 [IU] | Freq: Once | INTRAMUSCULAR | Status: AC
  Administered 2012-07-17: 2800 [IU]

## 2012-07-16 MED ORDER — SODIUM CHLORIDE 0.9 % IV SOLN
4.0000 g | INTRAVENOUS | Status: DC | PRN
  Administered 2012-07-17: 4 g via INTRAVENOUS
  Filled 2012-07-16 (×2): qty 40

## 2012-07-16 MED ORDER — DIPHENHYDRAMINE HCL 25 MG PO CAPS: 25.0000 mg | ORAL_CAPSULE | Freq: Four times a day (QID) | ORAL | Status: DC | PRN

## 2012-07-16 MED ORDER — ENOXAPARIN SODIUM 100 MG/ML ~~LOC~~ SOLN
90.0000 mg | Freq: Two times a day (BID) | SUBCUTANEOUS | Status: DC
  Administered 2012-07-16 – 2012-07-21 (×10): 90 mg via SUBCUTANEOUS
  Filled 2012-07-16 (×12): qty 1

## 2012-07-16 MED ORDER — CALCIUM CARBONATE ANTACID 500 MG PO CHEW
2.0000 | CHEWABLE_TABLET | ORAL | Status: AC
  Administered 2012-07-17: 400 mg via ORAL
  Filled 2012-07-16 (×2): qty 2

## 2012-07-16 MED ORDER — ACD FORMULA A 0.73-2.45-2.2 GM/100ML VI SOLN
Status: AC
  Administered 2012-07-17: 500 mL via INTRAVENOUS
  Filled 2012-07-16: qty 500

## 2012-07-16 NOTE — Progress Notes (Signed)
NUTRITION FOLLOW UP  Intervention:   1.  Enteral nutrition; continue current management.  Pt currently on TFs and has reached goal rate of Promote @ 85 mL/hr. 2.  Other providers; monitor for need for SLP assessment of dysphagia prior to starting PO diet.  Nutrition Dx:   Inadequate oral intake, ongoing  Monitor:   1. Enteral nutrition; initiation with tolerance if pt to remain intubated >48 hrs. Pt to meet >/=90% estimated needs.  Somewhat met, pt initiated TFs and is advancing to goal.    Assessment:   Pt admitted with dysphagia. Developed cardiac and respiratory arrest in the ED. Patient is currently intubated on ventilator support.  MV: 9.8 L/min Temp:Temp (24hrs), Avg:98.8 F (37.1 C), Min:98.2 F (36.8 C), Max:99.1 F (37.3 C)  Pt has been dx with myasthenias gravis. Pt is awake on ventilator but remains weak.  Is able to communicate comfort with current TFs.  Pt has reached goal rate of Promote at 85 mL/hr which provides 2040 kcal, 127g protein, and 1711 mL free water and meets 100% estimated needs.  Labs reviewed, no evidence of refeeding syndrome at this time. Residuals:  0 mL q 4 hrs +BMs. Pt working towards extubation, however no plans for extubation at present.   Secretions improved since Mestinon reduced. RD to follow.  Height: Ht Readings from Last 1 Encounters:  07/11/12 6' (1.829 m)    Weight Status:   Wt Readings from Last 1 Encounters:  07/14/12 203 lb 4.2 oz (92.2 kg)    Re-estimated needs:  Kcal: 2115 Protein: 106-132g Fluid: >2.2 L/day or per MD discretion  Skin: intact, non-pitting edema to lower extremities which is reduced from pitting edema  Diet Order:  NPO   Intake/Output Summary (Last 24 hours) at 07/16/12 1105 Last data filed at 07/16/12 0900  Gross per 24 hour  Intake   1987 ml  Output   7500 ml  Net  -5513 ml    Last BM: 2/4   Labs:   Lab 07/16/12 0535 07/15/12 0346 07/14/12 0348 07/10/12 2011  NA 132* 134* 139 --  K 3.6  3.5 3.4* --  CL 98 104 106 --  CO2 29 23 24  --  BUN 14 14 15  --  CREATININE 0.58 0.52 0.57 --  CALCIUM 7.8* 7.8* 7.8* --  MG -- 2.0 -- 2.3  PHOS -- 1.9* -- 3.6  GLUCOSE 114* 124* 100* --    CBG (last 3)  No results found for this basename: GLUCAP:3 in the last 72 hours  Scheduled Meds:    . antiseptic oral rinse  15 mL Mouth Rinse QID  . chlorhexidine  15 mL Mouth Rinse BID  . pantoprazole sodium  40 mg Per Tube Daily  . potassium chloride  40 mEq Per Tube Once  . pyridostigmine  30 mg Oral Q6H    Continuous Infusions:    . sodium chloride 20 mL (07/14/12 0000)  . feeding supplement (PROMOTE) 1,000 mL (07/15/12 1741)    Loyce Dys, MS RD LDN Clinical Inpatient Dietitian Pager: [phone] Weekend/After hours pager: [phone]

## 2012-07-16 NOTE — Clinical Social Work Note (Signed)
CSW had been following for multiple psychosocial issues. Pt remains on vent, thus full psychosocial not completed to date. Per chart review, Pt was seen by ACT team in the ED last August for depression and alcohol use. He was referred for outpt counseling at that time. Pt also lost his home and appears homeless. CSW has attempted to meet with Pt's father, but has missed him when he has been on the unit. Father does not appear to be aware of some of Pt's psychosocial concerns.  CSW will continue to follow for these concerns and will update [facility] CSW after transfer.   Doreen Salvage, LCSW ICU/Stepdown Clinical Social Worker [facility] Cell [phone] Hours 8am-1200pm M-F

## 2012-07-16 NOTE — Progress Notes (Signed)
Vital Capacity, best of three attempts, 500cc

## 2012-07-16 NOTE — Procedures (Signed)
Hemodialysis Insertion Procedure Note TOBIE HELLEN [id number] 21-Aug-1955  Procedure: Insertion of Hemodialysis Catheter Type: 3 port  Indications: Hemodialysis   Procedure Details Consent: Risks of procedure as well as the alternatives and risks of each were explained to the (patient/caregiver).  Consent for procedure obtained. Time Out: Verified patient identification, verified procedure, site/side was marked, verified correct patient position, special equipment/implants available, medications/allergies/relevent history reviewed, required imaging and test results available.  Performed  Maximum sterile technique was used including antiseptics, cap, gloves, gown, hand hygiene, mask and sheet. Skin prep: Chlorhexidine; local anesthetic administered A antimicrobial bonded/coated triple lumen catheter was placed in the right internal jugular vein using the Seldinger technique. Ultrasound guidance used.yes Catheter placed to 16 cm. Blood aspirated via all 3 ports and then flushed x 3. Line sutured x 2 and dressing applied.  Evaluation Blood flow good Complications: No apparent complications Patient did tolerate procedure well. Chest X-ray ordered to verify placement.  CXR: pending.  First assist Kandice Robinsons ACNP student.  Brett Canales Minor ACNP Adolph Pollack PCCM Pager [phone] till 3 pm If no answer page [phone] 07/16/2012, 12:12 PM   I was present for and supervised the entire procedure  [name], MD ; [facility] service Mobile [phone].  After 5:30 PM or weekends, call [phone]

## 2012-07-16 NOTE — Progress Notes (Signed)
ANTICOAGULATION CONSULT NOTE - Follow Up Consult  Pharmacy Consult for Lovenox Indication: DVT  No Known Allergies  Patient Measurements: Height: 6' (182.9 cm) Weight: 192 lb 3.9 oz (87.2 kg) IBW/kg (Calculated) : 77.6   Vital Signs: Temp: 97.6 F (36.4 C) (02/05 1200) Temp src: Oral (02/05 1500) BP: 95/54 mmHg (02/05 1545) Pulse Rate: 71  (02/05 1545)  Labs:  Basename 07/16/12 0535 07/15/12 0346 07/14/12 1811 07/14/12 1159 07/14/12 0348  HGB 10.8* 11.6* -- -- --  HCT 32.4* 34.8* -- -- 35.6*  PLT 137* 136* -- -- 122*  APTT -- -- -- -- --  LABPROT -- -- -- -- --  INR -- -- -- -- --  HEPARINUNFRC -- 0.28* 0.32 0.38 --  CREATININE 0.58 0.52 -- -- 0.57  CKTOTAL -- -- -- -- --  CKMB -- -- -- -- --  TROPONINI -- -- -- -- --    Estimated Creatinine Clearance: 113.2 ml/min (by C-G formula based on Cr of 0.58).  Assessment:  56 yom started on IV heparin on 1/31 for LLE DVT.  Third admission in 2 weeks for dysphagia, developed respiratory and cardiac arrest in ED 1/30.  Has hx DVT, not on anticoagulation PTA.  SCr is stable/wnl, CrCl > 100 ml/min.  CBC:  Stable Hgb, Plt are low/stable  No bleeding noted.  Goal of Therapy:  4h heparin level 0.6-1.2 mcg/ml Monitor platelets by anticoagulation protocol: Yes   Plan:   Resume Lovenox 90mg  SQ q12h,   q72h CBC, weekly SCr while on Lovneox  Follow up long-term anticoagulation plan.  Thank you for allowing pharmacy to be a part of this patients care team.  Lovenia Kim Pharm.D., BCPS Clinical Pharmacist 07/16/2012 5:37 PM Pager: [phone] Phone: [phone]

## 2012-07-16 NOTE — Progress Notes (Addendum)
NEURO HOSPITALIST PROGRESS NOTE   SUBJECTIVE:                                                                                                                        Patient alert, intubated, able to give me thumbs up on both hands when asked how he is doing.   OBJECTIVE:                                                                                                                           Vital signs in last 24 hours: Temp:  [98.6 F (37 C)-99.1 F (37.3 C)] 99 F (37.2 C) (02/04 2358) Pulse Rate:  [41-105] 63  (02/05 0700) Resp:  [14-27] 14  (02/05 0700) BP: (85-157)/(49-99) 97/56 mmHg (02/05 0700) SpO2:  [74 %-100 %] 98 % (02/05 0700) FiO2 (%):  [40 %-100 %] 40 % (02/05 0720)  Intake/Output from previous day: 02/04 0701 - 02/05 0700 In: 3222 [I.V.:1542; NG/GT:1680] Out: 8100 [Urine:8100] Intake/Output this shift:   Nutritional status:    Past Medical History  Diagnosis Date  . DVT (deep venous thrombosis)   . Hypertension   . High cholesterol      Neurologic Exam:  Mental Status: Alert,able to follow commands and gives bilateral thumbs up when asked questions. Remains intubated. Cranial Nerves: II: visual fields grossly normal, pupils equal, round, reactive to light and accommodation III,IV, VI: able to open his right eye briefly but unable to hold it open for prolonged time. He cannot open his left eyelid.Extraocular muscles horizontal gaze full bilaterally, decreased ability to look down with left eye and decreased ability to look up bilaterally V,VII: smile symmetric, facial light touch sensation normal bilaterally VIII: hearing normal bilaterally IX,X: gag reflex present XI: weak XII: unable to examine  Motor: Right : Upper extremity    Left:     Upper extremity 0/5 deltoid       0/5 deltoid 4/5 tricep      4/5 tricep 5/5 biceps      5/5 biceps  5/5wrist flexion     5/5 wrist flexion 5/5 wrist extension     5/5 wrist  extension 5/5 hand grip      5/5 hand grip  Lower extremity  Lower extremity 5/5 hip flexor      5/5 hip flexor 5/5 hip adductors     5/5 hip adductors 5/5 hip abductors     5/5 hip abductors 5/5 quadricep      5/5 quadriceps  5/5 hamstrings     5/5 hamstrings 5/5 plantar flexion       5/5 plantar flexion 5/5 plantar extension     5/5 plantar extension Tone and bulk:normal tone throughout; no atrophy noted Sensory: Pinprick and light touch intact throughout, bilaterally Deep Tendon Reflexes:  Right: Upper Extremity   Left: Upper extremity   biceps (C-5 to C-6) 2/4   biceps (C-5 to C-6) 2/4 tricep (C7) 2/4    triceps (C7) 2/4 Brachioradialis (C6) 2/4  Brachioradialis (C6) 2/4  Lower Extremity Lower Extremity  quadriceps (L-2 to L-4) 2/4   quadriceps (L-2 to L-4) 2/4 Achilles (S1) 2/4   Achilles (S1) 2/4 Plantars: Right: downgoing   Left: downgoing Cerebellar: normal finger-to-nose,   Lab Results: Lab Results  Component Value Date/Time   CHOL 223* 06/27/2009  8:22 PM   Lipid Panel No results found for this basename: CHOL,TRIG,HDL,CHOLHDL,VLDL,LDLCALC in the last 72 hours  Studies/Results: Dg Chest Port 1 View  07/16/2012  *RADIOLOGY REPORT*  Clinical Data: Ventilator  PORTABLE CHEST - 1 VIEW  Comparison: 07/15/2012  Findings: Endotracheal tube in satisfactory position.  NG tube in the stomach.  Central venous catheter tip remains in the SVC. Multiple EKG wires are present overlying the chest.  Bibasilar atelectasis is unchanged.  Mild vascular congestion without edema.  Probable small pleural effusions.  IMPRESSION: No significant change.   Original Report Authenticated By: Janeece Riggers, M.D.    Dg Chest Port 1 View  07/15/2012  *RADIOLOGY REPORT*  Clinical Data: Effusions, vent  PORTABLE CHEST - 1 VIEW  Comparison: 07/14/2012  Findings: Endotracheal tube terminates 3 cm above the carina.  Moderate right and small left pleural effusions, increased. Associated lower lobe  opacities, likely atelectasis. No pneumothorax.  Stable left subclavian venous catheter and enteric tube coursing below the diaphragm.  IMPRESSION: Endotracheal tube terminates 3 cm above the carina.  Moderate right and small left pleural effusions, increased.   Original Report Authenticated By: Charline Bills, M.D.     MEDICATIONS                                                                                                                        Scheduled:   . antiseptic oral rinse  15 mL Mouth Rinse QID  . chlorhexidine  15 mL Mouth Rinse BID  . enoxaparin (LOVENOX) injection  90 mg Subcutaneous Q12H  . Immune Globulin 5%  400 mg/kg Intravenous Q24 Hr x 5  . pantoprazole sodium  40 mg Per Tube Daily  . pyridostigmine  30 mg Oral Q6H    ASSESSMENT/PLAN:  Patient Active Hospital Problem List:  Myasthenia gravis (07/15/2012)   Assessment: Patient started Mestinon 30 mg Q6H IV.  Patient showing some improvement on Mestinon--now able to open his right eye for a brief period of time but unable to keep eyelid open. Oral secretions have been minimal per nurse. NIF continues to be -10 awaiting NIF for today. He will receive his 3rd dose today.      Plan: Continue IVIG and Mestinon.  Will continue to follow.   Assessment and plan discussed with with attending physician and they are in agreement.    Felicie Morn PA-C [facility] [phone]  07/16/2012, 8:17 AM    Addendum: After speaking with Dr. Roseanne Reno --Do to minimal improvement on IVIG and minimal improvements on NIF we recommend patient have IVIG stopped and PLX for 5 days initiated. I have spoken to [facility] team and they are in agreement.

## 2012-07-16 NOTE — Progress Notes (Addendum)
PULMONARY  / CRITICAL CARE MEDICINE  Name: Victor Durham MRN: [id number] DOB: [birth year]    ADMISSION DATE:  07/10/2012 CONSULTATION DATE:  1/30/20143  REFERRING MD :  Juleen China  CHIEF COMPLAINT:  Dysphagia  BRIEF PATIENT DESCRIPTION: 57 y/o homeless male presented to the [facility] ED On 1/30 for the third time in two weeks for evaluation of dysphagia.  He complained of some difficulty breathing and underwent a neck CT.  Shortly after that procedure he developed respiratory and cardiac arrest.  PCCM called for admission.  SIGNIFICANT EVENTS / STUDIES:  1/30 respiratory and cardiac arrest in ED 1/30 CT head: hypoattenuation R midbrain extending into L pons, poorly characterized due to streak artifact 130 Echocardiogram: LVEF normal. Mild to moderate RV dilatation 1/31 EGD gastritis and esophagitis but no mass or bleeding source 1/31 LE venous duplex scans: LLE DVT 1/31 Thick secretions -plugged ETT during MRI 1/31 MRI -Scattered subcortical T2 hyperintensities -nonspecific, chronic microvascular ischemia vs a demyelinating process  2/1 Neurology consult: concern for myasthenia gravis 2/1 CT chest - no thymoma or central PE, Bilateral lower lobe consolidation and atelectasis , Nonspecific enhancing solid 4.1 cm mass exophytic from segment 2 of the left hepatic lobe  2/2 Mestinon started - intolerant due to excessive secretions 2/3 IVIG initiated by Neuro 2/4 Acetylcholine Receptor Ab 127.00 (<=0.30 mmol/L) 2/4 MRI cervical spine >> normal 2/4 Mestinon retried at a decreased dose 2-4 tx to [facility].  LINES / TUBES: 1/30 ETT >>  1/30 L Browning CVL >> 2/5  RtI J HD Cath insertion>>   CULTURES: 1/30 blood >> NEG 1/30 urine >> NEG 2-2 sputum>> NEG 2-1 bc x 2 >> NEG  ANTIBIOTICS: unasyn 1/30 >> 2/4 levaquin 1/30 >> 2/4  SUBJECTIVE:  Appears less anxious, denies pain Tolerates PS 12 cm H2O  comfortably  VITAL SIGNS: Temp:  [98.6 F (37 C)-99.1 F (37.3 C)] 99 F (37.2 C) (02/04  2358) Pulse Rate:  [41-105] 71  (02/05 0800) Resp:  [14-27] 21  (02/05 0800) BP: (85-157)/(49-99) 99/62 mmHg (02/05 0800) SpO2:  [74 %-100 %] 97 % (02/05 0800) FiO2 (%):  [40 %-100 %] 40 % (02/05 0720) HEMODYNAMICS:   VENTILATOR SETTINGS: Vent Mode:  [-] PSV;CPAP FiO2 (%):  [40 %-100 %] 40 % Set Rate:  [14 bmp] 14 bmp Vt Set:  [620 mL] 620 mL PEEP:  [5 cmH20] 5 cmH20 Pressure Support:  [10 cmH20-12 cmH20] 12 cmH20 Plateau Pressure:  [14 cmH20-19 cmH20] 18 cmH20 INTAKE / OUTPUT: Intake/Output      02/04 0701 - 02/05 0700 02/05 0701 - 02/06 0700   I.V. (mL/kg) 1542 (16.7) 20 (0.2)   NG/GT 1680 80   IV Piggyback     Total Intake(mL/kg) 3222 (34.9) 100 (1.1)   Urine (mL/kg/hr) 8100 (3.7)    Total Output 8100    Net -4878 +100        Urine Occurrence  1 x     PHYSICAL EXAMINATION: Gen: RASS 0, NAD HEENT: + lid lag, o/w WNL PULM: diminished on R, no wheeze CV: RRR, no mgr, no JVD AB: Soft, NT, NABS Ext: 3+ symmetric pitting edema Neuro: remains diffusely weak, but some improvement 2/5.  LABS:  Lab 07/16/12 0535 07/15/12 0346 07/14/12 0348 07/12/12 0507 07/11/12 0630 07/11/12 0452 07/11/12 0158 07/10/12 2342 07/10/12 2310 07/10/12 2011 07/10/12 1645 07/10/12 0025  HGB 10.8* 11.6* 12.1* -- -- -- -- -- -- -- -- --  WBC 4.0 4.5 8.1 -- -- -- -- -- -- -- -- --  PLT 137* 136* 122* -- -- -- -- -- -- -- -- --  NA 132* 134* 139 -- -- -- -- -- -- -- -- --  K 3.6 3.5 -- -- -- -- -- -- -- -- -- --  CL 98 104 106 -- -- -- -- -- -- -- -- --  CO2 29 23 24  -- -- -- -- -- -- -- -- --  GLUCOSE 114* 124* 100* -- -- -- -- -- -- -- -- --  BUN 14 14 15  -- -- -- -- -- -- -- -- --  CREATININE 0.58 0.52 0.57 -- -- -- -- -- -- -- -- --  CALCIUM 7.8* 7.8* 7.8* -- -- -- -- -- -- -- -- --  MG -- 2.0 -- -- -- -- -- -- -- 2.3 -- --  PHOS -- 1.9* -- -- -- -- -- -- -- 3.6 -- --  AST -- -- -- 76* -- -- -- -- -- -- 28 --  ALT -- -- -- 104* -- -- -- -- -- -- 15 --  ALKPHOS -- -- -- 41 -- -- -- -- -- --  59 --  BILITOT -- -- -- 1.2 -- -- -- -- -- -- 1.3* --  PROT -- -- -- 5.5* -- -- -- -- -- -- 7.9 --  ALBUMIN -- -- -- 2.3* -- -- -- -- -- -- 3.7 --  APTT -- -- -- -- -- -- -- -- 37 -- -- --  INR -- -- -- -- -- -- -- -- 1.32 -- -- --  LATICACIDVEN -- -- -- -- -- -- -- -- -- -- 1.3 1.3  TROPONINI -- -- -- -- 0.49* -- 0.44* -- -- <0.30 -- --  PROCALCITON -- -- -- -- -- -- -- 0.11 -- -- -- --  PROBNP -- -- -- -- -- -- -- -- -- -- -- --  O2SATVEN -- -- -- -- -- -- -- -- -- -- -- --  PHART -- -- -- -- -- 7.436 -- -- -- 7.309* -- --  PCO2ART -- -- -- -- -- 28.8* -- -- -- 47.5* -- --  PO2ART -- -- -- -- -- 152.0* -- -- -- 315.0* -- --    Lab 07/11/12 0755 07/10/12 2001  GLUCAP 90 106*    CXR: Mr Cervical Spine W Wo Contrast  07/16/2012  *RADIOLOGY REPORT*  Clinical Data: Altered mental status.  Weakness.  MRI CERVICAL SPINE WITHOUT AND WITH CONTRAST  Technique:  Multiplanar and multiecho pulse sequences of the cervical spine, to include the craniocervical junction and cervicothoracic junction, were obtained according to standard protocol without and with intravenous contrast.  Contrast: 19mL MULTIHANCE GADOBENATE DIMEGLUMINE 529 MG/ML IV SOLN  Comparison: CT scan dated 07/10/2012  Findings: The visualized intracranial contents are normal. Cervical spinal cord is normal with no mass lesion or myelopathy or pathologic enhancement. Paraspinal soft tissues are normal. Endotracheal tube in place.  Right pleural effusion.  C2-3: Normal disc.  Mild left facet arthritis.  C3-4:  The disc.  Facet arthritis with moderate bilateral foraminal stenosis.  C4-5: Small disc bulge and osteophytes extending into the right neural foramen with moderate right foraminal stenosis.  C5-6: Small disc bulge into the left lateral recess without neural impingement.   C6-7:  Minimal disc bulge to the left of midline with no neural impingement.  C7-T1 through T2-3:  Normal.  IMPRESSION:  1.  No evidence of myelopathy or other  abnormality of the cervical spinal cord. 2.  Slight degenerative  disc disease in the cervical spine with foraminal stenosis bilaterally at C3-4, on the right at C4-5, and slight lateral recess narrowing at C5-6 on the left. 3.  Right pleural effusion.   Original Report Authenticated By: Francene Boyers, M.D.    Dg Chest Port 1 View  07/16/2012  *RADIOLOGY REPORT*  Clinical Data: Ventilator  PORTABLE CHEST - 1 VIEW  Comparison: 07/15/2012  Findings: Endotracheal tube in satisfactory position.  NG tube in the stomach.  Central venous catheter tip remains in the SVC. Multiple EKG wires are present overlying the chest.  Bibasilar atelectasis is unchanged.  Mild vascular congestion without edema.  Probable small pleural effusions.  IMPRESSION: No significant change.   Original Report Authenticated By: Janeece Riggers, M.D.    Dg Chest Port 1 View  07/15/2012  *RADIOLOGY REPORT*  Clinical Data: Effusions, vent  PORTABLE CHEST - 1 VIEW  Comparison: 07/14/2012  Findings: Endotracheal tube terminates 3 cm above the carina.  Moderate right and small left pleural effusions, increased. Associated lower lobe opacities, likely atelectasis. No pneumothorax.  Stable left subclavian venous catheter and enteric tube coursing below the diaphragm.  IMPRESSION: Endotracheal tube terminates 3 cm above the carina.  Moderate right and small left pleural effusions, increased.   Original Report Authenticated By: Charline Bills, M.D.       1/30 EKG: NSR, LAFB, no ST wave changes  ASSESSMENT / PLAN:  PULMONARY A: 1) Acute respiratory failure due to NM dz  (myesthenia)  2) Upper lobe airspace disease, resolved 3) Probable edema with effusions P:   --Cont vent support and daily SBT  Needs to tolerate T piece and/or have NIF > -20 cm H2O before extubation can be entertained - NIF 2/5 is -18 - Excellent response to diuresis 2/4 ( -4800 cc) -  abx D/C'd 2/4  CARDIOVASCULAR A:  1) Cardiac arrest secondary to acute respiratory  failure/hypoxemia 2) LLE DVT P:  -Cont Tele  RENAL A:   1) Volume overload ( -4800 on 2/4) 2) Hypokalemia resolved. K:3.6 ( 2/5)  3) Hypophosphatemia     Need to recheck Mg. And Phos. P:   - diuresis to extent permitted by BP and renal function - replete electrolytes as indicated   GASTROINTESTINAL A:   1) Dysphagia due to MG  P:   -Cont TFs -Swallow eval after extubation  HEMATOLOGIC A:  HGB 11.6 ( 2/4) to 10.8 ( 2/5)  with diuresis  Mild thrombocytopenia  DVT  P:  -Lovenox D/C'd 2/5 for HD cath placement -Resume after HD cath placed. -Monitor CBC  INFECTIOUS A:   1) No evidence of active infectious process P:   -D/C abx 2/4  ENDOCRINE A:   1) mild hyperglycemia P:   -monitor glucose -initiate SSI if CBGs > 180   NEUROLOGIC A:  1)  Myasthenia Gravis 2)  CT head and MRI abnormalities of uncertain significance P:   - IVIG  To be D/C'd per neuro -Mestinon resumed 2/4 at lower dose. -Neuro plan for plasma exchange. - For transfer to [facility] 2/5 for PLX - HD Cath insertion 2/5 for PLX  - treatment dose of Lovenox d/c'd for insertion. - resume Lovenox once HD cath placed.   [employer] Scala Caccavale [name] [phone],  back up contact for father is: [name]: [phone]   TODAY'S SUMMARY:  His NIF is -18  2/5. Would like to see NIF > -20 cm H2O and/or ability to tolerate SBT on T piece for > 30 mins. Rt i  j hd cath placed and tx to [facility] for  Plasma phoresis.    I have personally obtained a history, examined the patient, evaluated laboratory and imaging results, formulated the assessment and plan and placed orders. CRITICAL CARE: The patient is critically ill with multiple organ systems failure and requires high complexity decision making for assessment and support, frequent evaluation and titration of therapies, application of advanced monitoring technologies and extensive interpretation of multiple databases. Critical Care Time devoted to patient  care services described in this note is 35 minutes.   Father updated in detail @ bedside. He expressed frustration that he had not talked to a physician in several days. I assured him that we would make an effort to provide an update daily or at least every other day. He also spoke of his son's poor living situation (homeless for past several months). I assured him that our social Pharmacologist would work to procure all support services possible  [name], MD ; [facility] [phone].  After 5:30 PM or weekends, call [phone]

## 2012-07-16 NOTE — Progress Notes (Signed)
NIF -18 

## 2012-07-17 ENCOUNTER — Inpatient Hospital Stay (HOSPITAL_COMMUNITY): Payer: Medicaid Other

## 2012-07-17 LAB — GLUCOSE, CAPILLARY
Glucose-Capillary: 103 mg/dL — ABNORMAL HIGH (ref 70–99)
Glucose-Capillary: 104 mg/dL — ABNORMAL HIGH (ref 70–99)
Glucose-Capillary: 112 mg/dL — ABNORMAL HIGH (ref 70–99)

## 2012-07-17 LAB — POCT I-STAT, CHEM 8
Chloride: 100 mEq/L (ref 96–112)
Creatinine, Ser: 0.7 mg/dL (ref 0.50–1.35)
Glucose, Bld: 102 mg/dL — ABNORMAL HIGH (ref 70–99)
Potassium: 4.3 mEq/L (ref 3.5–5.1)

## 2012-07-17 LAB — CBC
HCT: 32 % — ABNORMAL LOW (ref 39.0–52.0)
Hemoglobin: 10.8 g/dL — ABNORMAL LOW (ref 13.0–17.0)
RDW: 13.8 % (ref 11.5–15.5)
WBC: 4 10*3/uL (ref 4.0–10.5)

## 2012-07-17 LAB — CULTURE, BLOOD (ROUTINE X 2): Culture: NO GROWTH

## 2012-07-17 LAB — STRIATED MUSCLE ANTIBODY: Striated Muscle Ab: 1:320 {titer} — AB

## 2012-07-17 LAB — BASIC METABOLIC PANEL
BUN: 12 mg/dL (ref 6–23)
Chloride: 104 mEq/L (ref 96–112)
GFR calc Af Amer: >90 mL/min (ref >90)
Glucose, Bld: 108 mg/dL — ABNORMAL HIGH (ref 70–99)
Potassium: 4 mEq/L (ref 3.5–5.1)

## 2012-07-17 LAB — PHOSPHORUS: Phosphorus: 2.7 mg/dL (ref 2.3–4.6)

## 2012-07-17 MED ORDER — SODIUM CHLORIDE 0.9 % IJ SOLN
10.0000 mL | INTRAMUSCULAR | Status: DC | PRN
  Administered 2012-07-17 – 2012-07-21 (×2): 10 mL

## 2012-07-17 MED ORDER — ALTEPLASE 2 MG IJ SOLR
2.0000 mg | Freq: Once | INTRAMUSCULAR | Status: AC
  Administered 2012-07-17: 2 mg
  Filled 2012-07-17: qty 2

## 2012-07-17 MED ORDER — SODIUM CHLORIDE 0.9 % IJ SOLN
10.0000 mL | Freq: Two times a day (BID) | INTRAMUSCULAR | Status: DC
  Administered 2012-07-17: 30 mL
  Administered 2012-07-17: 10 mL
  Administered 2012-07-18: 30 mL
  Administered 2012-07-19: 10 mL
  Administered 2012-07-19: 20 mL
  Administered 2012-07-20 – 2012-07-21 (×2): 10 mL
  Administered 2012-07-21: 20 mL
  Administered 2012-07-22 – 2012-07-23 (×4): 10 mL
  Administered 2012-07-24: 30 mL
  Administered 2012-07-24 – 2012-07-28 (×7): 10 mL

## 2012-07-17 NOTE — Progress Notes (Signed)
Clinical Social Worker met with pt at bedside.  Pt currently intubated but able to engage in conversation and write responses on paper.  Pt writes the appropriate response for orientation questions.  Pt states he received a divorce settlement and his ex-wife subsequently passed away.  Pt states his HCPOA is his Clinical research associate.  Pt nodded his head "yes" to understanding that currently as he is alert and oriented he is his own decision maker.  Pt appears tired; CSW to continue to follow and assist as needed.    [name], MSW, [place] [phone]

## 2012-07-17 NOTE — Progress Notes (Signed)
History: Victor Durham is an 57 y.o. male admitted after developing respiratory and cardiac arrest requiring intubation. Patient had presented to the ED three times in a two week period previous to the arrest with complaints of dysphagia. On the day of admission he completed a CT of the neck and then went into cardiopulmonary arrest. He has remained intubated. CT was showed a possible hypodensity in the right midbrain and left pons. MRI revealed scattered T2 hyperintensities most likely representing chronic microvascular ischemia  Subjective: Patient remains on vent, but is alert and communicates by writing on tablet. He writes that he has less secretions and that he has more energy today. He feels comfortable and is not irritated by the vent tube at present.  Objective: BP 96/56  Pulse 79  Temp 98.2 F (36.8 C) (Oral)  Resp 23  Ht 6' (1.829 m)  Wt 86.9 kg (191 lb 9.3 oz)  BMI 25.98 kg/m2  SpO2 93%  CBGs  Basename 07/17/12 1629 07/17/12 1226 07/17/12 0711 07/17/12 0333 07/17/12 0013 07/16/12 2026 07/16/12 1452  GLUCAP 100* 90 110* 103* 104* 116* 116*     Medications: Scheduled:   . antiseptic oral rinse  15 mL Mouth Rinse QID  . chlorhexidine  15 mL Mouth Rinse BID  . enoxaparin (LOVENOX) injection  90 mg Subcutaneous Q12H  . heparin lock flush  500 Units Intravenous Once  . pantoprazole sodium  40 mg Per Tube Daily  . pyridostigmine  30 mg Oral Q6H  . sodium chloride  10-40 mL Intracatheter Q12H    Neurologic Exam: Mental Status: Alert. Able to follow 1 step commands without difficulty. He is able to move all extremities to request. He is able to write appropriate answers to questions on paper. Cranial Nerves: II- Visual fields grossly intact, right eye. He is unable to see out of his left eye due to an injury with a forklift. 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 This was present prior to admission. III/IV/VI-Extraocular movements intact.  Pupils  reactive right eye. V/VII-Smile symmetric, facial light touch sensation normal bilaterally VIII-hearing grossly intact IX/X-normal gag (vent) XI-bilateral shoulder shrug weak but equal XII-midline tongue extension forceful against ETT Motor: 5/5 bilaterally with normal tone and bulk Sensory: Light touch intact throughout, bilaterally Deep Tendon Reflexes: 1+ diminished symmetric throughout Plantars: Downgoing bilaterally Cerebellar: Normal finger-to-nose slow but equal bilaterally. Unable to test gait due to vent.  Lab Results: CBC:  Lab 07/17/12 0414 07/17/12 0022 07/16/12 0535  WBC 4.0 -- 4.0  NEUTROABS -- -- --  HGB 10.8* 10.9* --  HCT 32.0* 32.0* --  MCV 89.4 -- 88.8  PLT 127* -- 137*   Basic Metabolic Panel:  Lab 07/17/12 1610 07/17/12 0022 07/16/12 0535 07/15/12 0346  NA 134* 135 -- --  K 4.0 4.3 -- --  CL 104 100 -- --  CO2 25 -- 29 --  GLUCOSE 108* 102* -- --  BUN 12 12 -- --  CREATININE 0.49* 0.70 -- --  CALCIUM 8.0* -- 7.8* --  MG 2.0 -- -- 2.0  PHOS 2.7 -- -- 1.9*   Liver Function Tests:  Lab 07/12/12 0507  AST 76*  ALT 104*  ALKPHOS 41  BILITOT 1.2  PROT 5.5*  ALBUMIN 2.3*   Hemoglobin A1C: No results found for this basename: HGBA1C in the last 168 hours Fasting Lipid Panel:  Lab 07/13/12 0356  CHOL --  HDL --  LDLCALC --  TRIG 66  CHOLHDL --  LDLDIRECT --   Thyroid Function Tests: No results found for this basename: TSH,T4TOTAL,FREET4,T3FREE,THYROIDAB in the last 168 hours Coagulation:  Lab 07/10/12 2310  LABPROT 16.1*  INR 1.32     Study Results: Mr Cervical Spine W Wo Contrast  07/16/2012  *RADIOLOGY REPORT*  Clinical Data: Altered mental status.  Weakness.  MRI CERVICAL SPINE WITHOUT AND WITH CONTRAST  Technique:  Multiplanar and multiecho pulse sequences of the cervical spine, to include the craniocervical junction and cervicothoracic junction, were obtained according to standard protocol without and with intravenous contrast.   Contrast: 19mL MULTIHANCE GADOBENATE DIMEGLUMINE 529 MG/ML IV SOLN  Comparison: CT scan dated 07/10/2012  Findings: The visualized intracranial contents are normal. Cervical spinal cord is normal with no mass lesion or myelopathy or pathologic enhancement. Paraspinal soft tissues are normal. Endotracheal tube in place.  Right pleural effusion.  C2-3: Normal disc.  Mild left facet arthritis.  C3-4:  The disc.  Facet arthritis with moderate bilateral foraminal stenosis.  C4-5: Small disc bulge and osteophytes extending into the right neural foramen with moderate right foraminal stenosis.  C5-6: Small disc bulge into the left lateral recess without neural impingement.   C6-7:  Minimal disc bulge to the left of midline with no neural impingement.  C7-T1 through T2-3:  Normal.  IMPRESSION:  1.  No evidence of myelopathy or other abnormality of the cervical spinal cord. 2.  Slight degenerative disc disease in the cervical spine with foraminal stenosis bilaterally at C3-4, on the right at C4-5, and slight lateral recess narrowing at C5-6 on the left. 3.  Right pleural effusion.   Original Report Authenticated By: Francene Boyers, M.D.    Dg Chest Port 1 View  07/17/2012  *RADIOLOGY REPORT*  Clinical Data: Follow up pleural effusions, shortness of breath  PORTABLE CHEST - 1 VIEW  Comparison: /5/14  Findings: The heart and pulmonary vascularity are stable. Persistent consolidation in the right lower lobe is noted with associated effusion.  A dialysis catheter and left-sided subclavian catheter are again seen and stable.  An endotracheal tube and nasogastric catheter are also stable.  IMPRESSION: The overall appearance is stable from prior exam.  No new focal abnormality is noted.   Original Report Authenticated By: Alcide Clever, M.D.    Dg Chest Port 1 View  07/16/2012  *RADIOLOGY REPORT*  Clinical Data: Dialysis catheter placement  PORTABLE CHEST - 1 VIEW  Comparison: 07/16/2012  Findings: New right internal jugular vein  dialysis catheter placed with its tip in lower SVC.  No pneumothorax. Mediastinum is not shifted to the right worrisome for increasing collapse of the right lower lobe.  Left lung is grossly clear.  Endotracheal tube and left subclavian central venous catheter are stable.  Stable NG tube.  IMPRESSION: New right internal jugular vein dialysis catheter placed with its tip in the SVC and no pneumothorax.  Increasing collapse of the right lower lobe.   Original Report Authenticated By: Jolaine Click, M.D.    Dg Chest Port 1 View  07/16/2012  *RADIOLOGY REPORT*  Clinical Data: Ventilator  PORTABLE CHEST - 1 VIEW  Comparison: 07/15/2012  Findings: Endotracheal tube in satisfactory position.  NG tube in the stomach.  Central venous catheter tip remains in the SVC. Multiple EKG wires are present overlying the chest.  Bibasilar atelectasis is unchanged.  Mild vascular congestion without edema.  Probable small pleural effusions.  IMPRESSION: No significant change.   Original Report Authenticated By: Janeece Riggers, M.D.       Assessment/Plan:  Myasthenia Gravis  Currently receiving Mestinon  Plasma exchange x 4 completed around 0200 today; will plan similar plasma exchange on 07/19/2012.   LOS: 7 days   Job Founds, MBA, [facility] Pager [phone]   Agree with the above assessment and management plans. Venetia Maxon M.D.  07/17/2012  4:56 PM

## 2012-07-17 NOTE — Progress Notes (Signed)
Pt indicating that he feels short of breath. Patient returned to full support do decrease shortness of breath.

## 2012-07-17 NOTE — Progress Notes (Signed)
PULMONARY  / CRITICAL CARE MEDICINE  Name: Victor Durham MRN: [id number] DOB: Oct 22, 1955    ADMISSION DATE:  07/10/2012 CONSULTATION DATE:  1/30/20143  REFERRING MD :  Juleen China  CHIEF COMPLAINT:  Dysphagia  BRIEF PATIENT DESCRIPTION: 57 y/o homeless male presented to the [facility] ED On 1/30 for the third time in two weeks for evaluation of dysphagia.  He complained of some difficulty breathing and underwent a neck CT.  Shortly after that procedure he developed respiratory and cardiac arrest.  PCCM called for admission.  SIGNIFICANT EVENTS / STUDIES:  1/30 respiratory and cardiac arrest in ED 1/30 CT head: hypoattenuation R midbrain extending into L pons, poorly characterized due to streak artifact 130 Echocardiogram: LVEF normal. Mild to moderate RV dilatation 1/31 EGD gastritis and esophagitis but no mass or bleeding source 1/31 LE venous duplex scans: LLE DVT 1/31 Thick secretions -plugged ETT during MRI 1/31 MRI -Scattered subcortical T2 hyperintensities -nonspecific, chronic microvascular ischemia vs a demyelinating process  2/1 Neurology consult: concern for myasthenia gravis 2/1 CT chest - no thymoma or central PE, Bilateral lower lobe consolidation and atelectasis , Nonspecific enhancing solid 4.1 cm mass exophytic from segment 2 of the left hepatic lobe  2/2 Mestinon started - intolerant due to excessive secretions 2/3 IVIG initiated by Neuro 2/4 Acetylcholine Receptor Ab 127.00 (<=0.30 mmol/L) 2/4 MRI cervical spine >> normal 2/4 Mestinon retried at a decreased dose 2-4 tx to cone.  LINES / TUBES: 1/30 ETT >>  1/30 L [place] CVL >> 2/5  RtI J HD Cath insertion>>   CULTURES: 1/30 blood >> NEG 1/30 urine >> NEG 2-2 sputum>> NEG 2-1 bc x 2 >> NEG  ANTIBIOTICS: unasyn 1/30 >> 2/4 levaquin 1/30 >> 2/4  SUBJECTIVE:  Tolerating pressure support.  VITAL SIGNS: Temp:  [97.6 F (36.4 C)-99.4 F (37.4 C)] 98.6 F (37 C) (02/06 0837) Pulse Rate:  [58-78] 77  (02/06  0810) Resp:  [8-22] 22  (02/06 0810) BP: (86-145)/(50-96) 103/57 mmHg (02/06 0810) SpO2:  [91 %-100 %] 91 % (02/06 0810) FiO2 (%):  [39.7 %-40.4 %] 40.1 % (02/06 0810) Weight:  [191 lb 9.3 oz (86.9 kg)-192 lb 3.9 oz (87.2 kg)] 191 lb 9.3 oz (86.9 kg) (02/06 0500) VENTILATOR SETTINGS: Vent Mode:  [-] PRVC FiO2 (%):  [39.7 %-40.4 %] 40.1 % Set Rate:  [14 bmp] 14 bmp Vt Set:  [620 mL] 620 mL PEEP:  [5 cmH20] 5 cmH20 Pressure Support:  [12 cmH20] 12 cmH20 Plateau Pressure:  [17 cmH20-20 cmH20] 20 cmH20 INTAKE / OUTPUT: Intake/Output      02/05 0701 - 02/06 0700 02/06 0701 - 02/07 0700   I.V. (mL/kg) 1480 (17) 40 (0.5)   NG/GT 2010 85   Total Intake(mL/kg) 3490 (40.2) 125 (1.4)   Urine (mL/kg/hr) 2895 (1.4) 500   Total Output 2895 500   Net +595 -375        Urine Occurrence 1 x    Stool Occurrence  1 x     PHYSICAL EXAMINATION: Gen: No distress HEENT: ETT in place PULM: diminished on Rt, no wheeze CV: s1s2 regular, no murmur AB: Soft, non tender Ext: 3+ symmetric non-pitting edema Neuro: moves extremities, follows commands  LABS:  Lab 07/17/12 0414 07/16/12 0535 07/15/12 0346 07/12/12 0507 07/11/12 0630 07/11/12 0452 07/11/12 0158 07/10/12 2342 07/10/12 2310 07/10/12 2011 07/10/12 1645  HGB 10.8* 10.8* 11.6* -- -- -- -- -- -- -- --  WBC 4.0 4.0 4.5 -- -- -- -- -- -- -- --  PLT 127* 137* 136* -- -- -- -- -- -- -- --  NA 134* 132* 134* -- -- -- -- -- -- -- --  K 4.0 3.6 -- -- -- -- -- -- -- -- --  CL 104 98 104 -- -- -- -- -- -- -- --  CO2 25 29 23  -- -- -- -- -- -- -- --  GLUCOSE 108* 114* 124* -- -- -- -- -- -- -- --  BUN 12 14 14  -- -- -- -- -- -- -- --  CREATININE 0.49* 0.58 0.52 -- -- -- -- -- -- -- --  CALCIUM 8.0* 7.8* 7.8* -- -- -- -- -- -- -- --  MG 2.0 -- 2.0 -- -- -- -- -- -- 2.3 --  PHOS 2.7 -- 1.9* -- -- -- -- -- -- 3.6 --  AST -- -- -- 76* -- -- -- -- -- -- 28  ALT -- -- -- 104* -- -- -- -- -- -- 15  ALKPHOS -- -- -- 41 -- -- -- -- -- -- 59  BILITOT --  -- -- 1.2 -- -- -- -- -- -- 1.3*  PROT -- -- -- 5.5* -- -- -- -- -- -- 7.9  ALBUMIN -- -- -- 2.3* -- -- -- -- -- -- 3.7  APTT -- -- -- -- -- -- -- -- 37 -- --  INR -- -- -- -- -- -- -- -- 1.32 -- --  LATICACIDVEN -- -- -- -- -- -- -- -- -- -- 1.3  TROPONINI -- -- -- -- 0.49* -- 0.44* -- -- <0.30 --  PROCALCITON -- -- -- -- -- -- -- 0.11 -- -- --  PROBNP -- -- -- -- -- -- -- -- -- -- --  O2SATVEN -- -- -- -- -- -- -- -- -- -- --  PHART -- -- -- -- -- 7.436 -- -- -- 7.309* --  PCO2ART -- -- -- -- -- 28.8* -- -- -- 47.5* --  PO2ART -- -- -- -- -- 152.0* -- -- -- 315.0* --    Lab 07/17/12 0711 07/17/12 0333 07/17/12 0013 07/16/12 2026 07/16/12 1452  GLUCAP 110* 103* 104* 116* 116*    CXR: Mr Cervical Spine W Wo Contrast  07/16/2012  *RADIOLOGY REPORT*  Clinical Data: Altered mental status.  Weakness.  MRI CERVICAL SPINE WITHOUT AND WITH CONTRAST  Technique:  Multiplanar and multiecho pulse sequences of the cervical spine, to include the craniocervical junction and cervicothoracic junction, were obtained according to standard protocol without and with intravenous contrast.  Contrast: 19mL MULTIHANCE GADOBENATE DIMEGLUMINE 529 MG/ML IV SOLN  Comparison: CT scan dated 07/10/2012  Findings: The visualized intracranial contents are normal. Cervical spinal cord is normal with no mass lesion or myelopathy or pathologic enhancement. Paraspinal soft tissues are normal. Endotracheal tube in place.  Right pleural effusion.  C2-3: Normal disc.  Mild left facet arthritis.  C3-4:  The disc.  Facet arthritis with moderate bilateral foraminal stenosis.  C4-5: Small disc bulge and osteophytes extending into the right neural foramen with moderate right foraminal stenosis.  C5-6: Small disc bulge into the left lateral recess without neural impingement.   C6-7:  Minimal disc bulge to the left of midline with no neural impingement.  C7-T1 through T2-3:  Normal.  IMPRESSION:  1.  No evidence of myelopathy or other  abnormality of the cervical spinal cord. 2.  Slight degenerative disc disease in the cervical spine with foraminal stenosis bilaterally at C3-4, on the right at C4-5, and  slight lateral recess narrowing at C5-6 on the left. 3.  Right pleural effusion.   Original Report Authenticated By: Francene Boyers, M.D.    Dg Chest Port 1 View  07/17/2012  *RADIOLOGY REPORT*  Clinical Data: Follow up pleural effusions, shortness of breath  PORTABLE CHEST - 1 VIEW  Comparison: /5/14  Findings: The heart and pulmonary vascularity are stable. Persistent consolidation in the right lower lobe is noted with associated effusion.  A dialysis catheter and left-sided subclavian catheter are again seen and stable.  An endotracheal tube and nasogastric catheter are also stable.  IMPRESSION: The overall appearance is stable from prior exam.  No new focal abnormality is noted.   Original Report Authenticated By: Alcide Clever, M.D.    Dg Chest Port 1 View  07/16/2012  *RADIOLOGY REPORT*  Clinical Data: Dialysis catheter placement  PORTABLE CHEST - 1 VIEW  Comparison: 07/16/2012  Findings: New right internal jugular vein dialysis catheter placed with its tip in lower SVC.  No pneumothorax. Mediastinum is not shifted to the right worrisome for increasing collapse of the right lower lobe.  Left lung is grossly clear.  Endotracheal tube and left subclavian central venous catheter are stable.  Stable NG tube.  IMPRESSION: New right internal jugular vein dialysis catheter placed with its tip in the SVC and no pneumothorax.  Increasing collapse of the right lower lobe.   Original Report Authenticated By: Jolaine Click, M.D.    Dg Chest Port 1 View  07/16/2012  *RADIOLOGY REPORT*  Clinical Data: Ventilator  PORTABLE CHEST - 1 VIEW  Comparison: 07/15/2012  Findings: Endotracheal tube in satisfactory position.  NG tube in the stomach.  Central venous catheter tip remains in the SVC. Multiple EKG wires are present overlying the chest.  Bibasilar  atelectasis is unchanged.  Mild vascular congestion without edema.  Probable small pleural effusions.  IMPRESSION: No significant change.   Original Report Authenticated By: Janeece Riggers, M.D.     ASSESSMENT / PLAN:  PULMONARY A: Acute respiratory failure due to Myasthenia Gravis, and Aspiration PNA. P:   Pressure support wean as tolerated Assess NIF prior to extubation >> needs to be greater than -20 cm H2O F/u CXR  CARDIOVASCULAR A: LLE DVT P:  Lovenox  RENAL A:  Volume overload. P:   Negative fluid balance as tolerated F/u renal fx F/u and replace electrolytes as needed  GASTROINTESTINAL A:  Dysphagia due to MG. Nutrition. P:   Continue tube feeds while on vent Will need swallow evaluation after extubation  HEMATOLOGIC A: Anemia of critical illness. Thrombocytopenia. P:  F/u CBC Transfuse for Hb < 7  INFECTIOUS A:  Aspiration PNA. Completed Abx 2/04. P:   Monitor clinically  ENDOCRINE A:  Hyperglycemia. P:   Monitor blood sugar on BMET  NEUROLOGIC A: Myasthenia Gravis P:   IVIG, mestinon, plasma exchange per neurology  Family contact:  Benney Sommerville [name] [phone]; back up contact for father: [name]: [phone]  Resolved problems >> cardiac arrest 2nd to respiratory failure  CC time 35 minutes.  Coralyn Helling, MD [facility] Pulmonary/Critical Care 07/17/2012, 9:20 AM Pager:  [phone] After 3pm call: [phone]

## 2012-07-17 NOTE — Progress Notes (Signed)
Went in to patients room after plasma phoresis and equipment removed per HD RN Al, syringe was left hooked to catheter, no documentation of heparin locking and capping catheter post phoresis. I pulled back on both catheters/flushed each, then locked both with 1.42ml heparin per order. Heparin locked sticker placed on catheter.

## 2012-07-18 ENCOUNTER — Encounter (HOSPITAL_COMMUNITY): Payer: Self-pay

## 2012-07-18 ENCOUNTER — Inpatient Hospital Stay (HOSPITAL_COMMUNITY): Payer: Medicaid Other

## 2012-07-18 LAB — CBC
HCT: 34 % — ABNORMAL LOW (ref 39.0–52.0)
MCV: 89 fL (ref 78.0–100.0)
Platelets: 142 10*3/uL — ABNORMAL LOW (ref 150–400)
RBC: 3.82 MIL/uL — ABNORMAL LOW (ref 4.22–5.81)
WBC: 4.3 10*3/uL (ref 4.0–10.5)

## 2012-07-18 LAB — BASIC METABOLIC PANEL
CO2: 28 mEq/L (ref 19–32)
Chloride: 101 mEq/L (ref 96–112)
Sodium: 134 mEq/L — ABNORMAL LOW (ref 135–145)

## 2012-07-18 LAB — CULTURE, BLOOD (ROUTINE X 2)
Culture: NO GROWTH
Culture: NO GROWTH

## 2012-07-18 LAB — GLUCOSE, CAPILLARY
Glucose-Capillary: 101 mg/dL — ABNORMAL HIGH (ref 70–99)
Glucose-Capillary: 101 mg/dL — ABNORMAL HIGH (ref 70–99)

## 2012-07-18 MED ORDER — DIPHENHYDRAMINE HCL 25 MG PO CAPS: 25.0000 mg | ORAL_CAPSULE | Freq: Four times a day (QID) | ORAL | Status: DC | PRN

## 2012-07-18 MED ORDER — CALCIUM GLUCONATE 10 % IV SOLN
2.0000 g | INTRAVENOUS | Status: DC | PRN
  Filled 2012-07-18: qty 20

## 2012-07-18 MED ORDER — FENTANYL CITRATE 0.05 MG/ML IJ SOLN
INTRAMUSCULAR | Status: AC
Start: 2012-07-18 — End: 2012-07-18
  Administered 2012-07-18: 200 ug
  Filled 2012-07-18: qty 4

## 2012-07-18 MED ORDER — MIDAZOLAM HCL 2 MG/2ML IJ SOLN
INTRAMUSCULAR | Status: AC
  Administered 2012-07-18: 4 mg
  Filled 2012-07-18: qty 4

## 2012-07-18 MED ORDER — SODIUM CHLORIDE 0.9 % IV SOLN
INTRAVENOUS | Status: AC
  Administered 2012-07-18 (×4): via INTRAVENOUS_CENTRAL
  Filled 2012-07-18 (×4): qty 200

## 2012-07-18 MED ORDER — SODIUM CHLORIDE 0.9 % IV SOLN
4.0000 g | INTRAVENOUS | Status: DC | PRN
  Administered 2012-07-18: 4 g via INTRAVENOUS
  Filled 2012-07-18: qty 40

## 2012-07-18 MED ORDER — ACD FORMULA A 0.73-2.45-2.2 GM/100ML VI SOLN
500.0000 mL | Status: DC
  Administered 2012-07-18 – 2012-07-20 (×2): 500 mL via INTRAVENOUS
  Filled 2012-07-18: qty 500

## 2012-07-18 MED ORDER — HEPARIN SODIUM (PORCINE) 1000 UNIT/ML IJ SOLN
1000.0000 [IU] | Freq: Once | INTRAMUSCULAR | Status: AC
  Administered 2012-07-18: 1000 [IU]

## 2012-07-18 MED ORDER — ASPIRIN 81 MG PO CHEW
81.0000 mg | CHEWABLE_TABLET | Freq: Every day | ORAL | Status: DC
  Administered 2012-07-18 – 2012-07-19 (×2): 81 mg
  Filled 2012-07-18 (×2): qty 1

## 2012-07-18 MED ORDER — SODIUM CHLORIDE 0.9 % IV SOLN
2.0000 g | INTRAVENOUS | Status: DC | PRN
  Filled 2012-07-18: qty 20

## 2012-07-18 MED ORDER — ACD FORMULA A 0.73-2.45-2.2 GM/100ML VI SOLN
Status: AC
  Administered 2012-07-18: 500 mL via INTRAVENOUS
  Filled 2012-07-18: qty 500

## 2012-07-18 MED ORDER — CALCIUM CARBONATE ANTACID 500 MG PO CHEW
2.0000 | CHEWABLE_TABLET | ORAL | Status: DC | PRN
  Filled 2012-07-18: qty 2

## 2012-07-18 MED ORDER — ACETAMINOPHEN 325 MG PO TABS: 650.0000 mg | ORAL_TABLET | ORAL | Status: DC | PRN

## 2012-07-18 NOTE — Procedures (Signed)
Bronchoscopy Procedure Note Victor Durham [id number] [birth year]  Procedure: Bronchoscopy Indications: Obtain specimens for culture and/or other diagnostic studies  Procedure Details Consent: Risks of procedure as well as the alternatives and risks of each were explained to the (patient/caregiver).  Consent for procedure obtained. Time Out: Verified patient identification, verified procedure, site/side was marked, verified correct patient position, special equipment/implants available, medications/allergies/relevent history reviewed, required imaging and test results available.  Performed  In preparation for procedure, patient was given 100% FiO2. Sedation: Benzodiazepines and Fenatanyl  Airway entered and the following bronchi were examined: RUL, RML, RLL, LUL, LLL and Bronchi.   Procedures performed: Brushings performed Bronchoscope removed.    Evaluation Hemodynamic Status: BP stable throughout; O2 sats: stable throughout Patient's Current Condition: stable Specimens:  Sent purulent fluid Complications: No apparent complications Patient did tolerate procedure well.   Victor Durham 07/18/2012

## 2012-07-18 NOTE — Progress Notes (Signed)
PULMONARY  / CRITICAL CARE MEDICINE  Name: Victor Durham MRN: [id number] DOB: 13-Aug-1955    ADMISSION DATE:  07/10/2012 CONSULTATION DATE:  1/30/20143  REFERRING MD :  Juleen China  CHIEF COMPLAINT:  Dysphagia  BRIEF PATIENT DESCRIPTION: 57 y/o homeless male presented to the [facility] ED On 1/30 for the third time in two weeks for evaluation of dysphagia.  He complained of some difficulty breathing and underwent a neck CT.  Shortly after that procedure he developed respiratory and cardiac arrest.  PCCM called for admission.  SIGNIFICANT EVENTS / STUDIES:  1/30 respiratory and cardiac arrest in ED 1/30 CT head: hypoattenuation R midbrain extending into L pons, poorly characterized due to streak artifact 130 Echocardiogram: LVEF normal. Mild to moderate RV dilatation 1/31 EGD gastritis and esophagitis but no mass or bleeding source 1/31 LE venous duplex scans: LLE DVT 1/31 Thick secretions -plugged ETT during MRI 1/31 MRI -Scattered subcortical T2 hyperintensities -nonspecific, chronic microvascular ischemia vs a demyelinating process  2/1 Neurology consult: concern for myasthenia gravis 2/1 CT chest - no thymoma or central PE, Bilateral lower lobe consolidation and atelectasis , Nonspecific enhancing solid 4.1 cm mass exophytic from segment 2 of the left hepatic lobe  2/2 Mestinon started - intolerant due to excessive secretions 2/3 IVIG initiated by Neuro 2/4 Acetylcholine Receptor Ab 127.00 (<=0.30 mmol/L) 2/4 MRI cervical spine >> normal 2/4 Mestinon retried at a decreased dose 2-4 tx to cone.  LINES / TUBES: 1/30 ETT >>  1/30 L [place] CVL >> 2/5  RtI J HD Cath insertion>>   CULTURES: 1/30 blood >> NEG 1/30 urine >> NEG 2-2 sputum>> NEG 2-1 bc x 2 >> NEG  ANTIBIOTICS: unasyn 1/30 >> 2/4 levaquin 1/30 >> 2/4  SUBJECTIVE:  On CPAP for about 8 hours yesterday before going back on full support due to increased dyspnea.  Doing okay today.  States that he is tired from the CPAP trial  yesterday.  Some pain in the anterior chest wall with coughing.  VITAL SIGNS: Temp:  [97.1 F (36.2 C)-98.6 F (37 C)] 97.1 F (36.2 C) (02/07 0406) Pulse Rate:  [58-84] 58  (02/07 0500) Resp:  [0-25] 14  (02/07 0500) BP: (90-125)/(48-70) 91/63 mmHg (02/07 0500) SpO2:  [88 %-99 %] 97 % (02/07 0500) FiO2 (%):  [39.5 %-40.3 %] 40.2 % (02/07 0500) Weight:  [191 lb 5.8 oz (86.8 kg)] 191 lb 5.8 oz (86.8 kg) (02/07 0500) VENTILATOR SETTINGS: Vent Mode:  [-] PRVC FiO2 (%):  [39.5 %-40.3 %] 40.2 % Set Rate:  [14 bmp] 14 bmp Vt Set:  [620 mL] 620 mL PEEP:  [4.3 cmH20-5 cmH20] 5 cmH20 Pressure Support:  [10 cmH20] 10 cmH20 Plateau Pressure:  [15 cmH20-23 cmH20] 17 cmH20 INTAKE / OUTPUT: Intake/Output      02/06 0701 - 02/07 0700 02/07 0701 - 02/08 0700   I.V. (mL/kg) 440 (5.1)    NG/GT 2170    Total Intake(mL/kg) 2610 (30.1)    Urine (mL/kg/hr) 3825 (1.8)    Total Output 3825    Net -1215         Stool Occurrence 1 x      PHYSICAL EXAMINATION: Gen: No distress HEENT: ETT in place PULM: diminished in Rt base, no wheeze CV: s1s2 regular, no murmur AB: Soft, non tender Ext: 2+ symmetric non-pitting edema Neuro: moves extremities, follows commands  LABS:  Lab 07/18/12 0430 07/17/12 0414 07/17/12 0022 07/16/12 0535 07/15/12 0346 07/12/12 0507  HGB 11.4* 10.8* 10.9* -- -- --  WBC  4.3 4.0 -- 4.0 -- --  PLT 142* 127* -- 137* -- --  NA 134* 134* 135 -- -- --  K 3.9 4.0 -- -- -- --  CL 101 104 100 -- -- --  CO2 28 25 -- 29 -- --  GLUCOSE 95 108* 102* -- -- --  BUN 16 12 12  -- -- --  CREATININE 0.51 0.49* 0.70 -- -- --  CALCIUM 8.5 8.0* -- 7.8* -- --  MG -- 2.0 -- -- 2.0 --  PHOS -- 2.7 -- -- 1.9* --  AST -- -- -- -- -- 76*  ALT -- -- -- -- -- 104*  ALKPHOS -- -- -- -- -- 41  BILITOT -- -- -- -- -- 1.2  PROT -- -- -- -- -- 5.5*  ALBUMIN -- -- -- -- -- 2.3*  APTT -- -- -- -- -- --  INR -- -- -- -- -- --  LATICACIDVEN -- -- -- -- -- --  TROPONINI -- -- -- -- -- --   PROCALCITON -- -- -- -- -- --  PROBNP -- -- -- -- -- --  O2SATVEN -- -- -- -- -- --  PHART -- -- -- -- -- --  PCO2ART -- -- -- -- -- --  PO2ART -- -- -- -- -- --    Lab 07/18/12 0314 07/17/12 2337 07/17/12 2040 07/17/12 1629 07/17/12 1226  GLUCAP 101* 112* 99 100* 90    CXR: Dg Chest Port 1 View  07/18/2012  *RADIOLOGY REPORT*  Clinical Data: Pneumonia.  Pleural effusion.  PORTABLE CHEST - 1 VIEW  Comparison: 07/17/2012  Findings: Endotracheal tube tip is 16 mm above the carina and could be slightly retracted.  Central venous catheters and NG tube appear unchanged.  Pulmonary vascular prominence has resolved.  Left lung is now clear.  Improved aeration in the right upper lobe.  Persistent atelectasis / consolidation of the right lower lobe with an adjacent  small effusion.  IMPRESSION: Improved pulmonary vascularity.  Improved aeration in the right upper lobe.  Persistent effusion and atelectasis / consolidation of the right lower lobe.   Original Report Authenticated By: Francene Boyers, M.D.    Dg Chest Port 1 View  07/17/2012  *RADIOLOGY REPORT*  Clinical Data: Follow up pleural effusions, shortness of breath  PORTABLE CHEST - 1 VIEW  Comparison: /5/14  Findings: The heart and pulmonary vascularity are stable. Persistent consolidation in the right lower lobe is noted with associated effusion.  A dialysis catheter and left-sided subclavian catheter are again seen and stable.  An endotracheal tube and nasogastric catheter are also stable.  IMPRESSION: The overall appearance is stable from prior exam.  No new focal abnormality is noted.   Original Report Authenticated By: Alcide Clever, M.D.    Dg Chest Port 1 View  07/16/2012  *RADIOLOGY REPORT*  Clinical Data: Dialysis catheter placement  PORTABLE CHEST - 1 VIEW  Comparison: 07/16/2012  Findings: New right internal jugular vein dialysis catheter placed with its tip in lower SVC.  No pneumothorax. Mediastinum is not shifted to the right worrisome for  increasing collapse of the right lower lobe.  Left lung is grossly clear.  Endotracheal tube and left subclavian central venous catheter are stable.  Stable NG tube.  IMPRESSION: New right internal jugular vein dialysis catheter placed with its tip in the SVC and no pneumothorax.  Increasing collapse of the right lower lobe.   Original Report Authenticated By: Jolaine Click, M.D.     ASSESSMENT / PLAN:  PULMONARY A: Acute respiratory failure due to Myasthenia Gravis, and Aspiration PNA. ? Mucous plugging in RLL P:   Pressure support wean as tolerated Assess NIF prior to extubation >> needs to be greater than -20 cm H2O F/u CXR Possible bronchoscopy today  CARDIOVASCULAR A: LLE DVT P:  Lovenox ASA 81mg   RENAL A:  Volume overload. P:   Negative fluid balance as tolerated F/u renal fx F/u and replace electrolytes as needed  GASTROINTESTINAL A:  Dysphagia due to MG. Nutrition. P:   Continue tube feeds while on vent Will need swallow evaluation after extubation  HEMATOLOGIC A: Anemia of critical illness. Thrombocytopenia. P:  F/u CBC Transfuse for Hb < 7  INFECTIOUS A:  Aspiration PNA. Completed Abx 2/04. P:   Monitor clinically  ENDOCRINE A:  Hyperglycemia. P:   Monitor blood sugar on BMET  NEUROLOGIC A: Myasthenia Gravis P:   S/p IVIG mestinon  plasma exchange per neurology, 2nd treatment 2/8  Family contact:  Stellan Fieldhouse [name] [phone]; back up contact for father: [name]: [phone]  Resolved problems >> cardiac arrest 2nd to respiratory failure  BOOTH, ERIN Family Medicine, PGY-2 07/18/2012, 7:57 AM  Reviewed above, examined pt, and agree with assessment/plan.  Continue pressure support wean as tolerated >> not ready for extubation.  Will arrange for bronchoscopy to assess RLL.  CC time 35 minutes.  Coralyn Helling, MD [facility] Pulmonary/Critical Care 07/18/2012, 12:04 PM Pager:  [phone] After 3pm call: [phone]

## 2012-07-18 NOTE — Progress Notes (Signed)
Subjective: No new complaints. Patient feels he is essentially unchanged from yesterday. He is no longer experiencing diplopia. Severity of ptosis of eyelids fluctuates. Still able to communicate by writing. There has been no change in extremity strength. No problems with excessive secretions at this point. He is having to use suction for management of secretions because of dysphagia.  Objective: Current vital signs: BP 104/58  Pulse 67  Temp 98.6 F (37 C) (Oral)  Resp 22  Ht 6' (1.829 m)  Wt 86.8 kg (191 lb 5.8 oz)  BMI 25.95 kg/m2  SpO2 93%  Neurologic Exam: Alert and in no acute distress. Clearly breathing above the ventilator and is comfortable. Bilateral ptosis of eyelids, left greater than right. Extraocular movements were full and conjugate with no diplopia in any field of gaze. Moderately severe weakness of the orbicularis oculi as well as frontalis muscles bilaterally. Normal strength of upper and lower extremities.  Lab Results: Results for orders placed during the hospital encounter of 07/10/12 (from the past 48 hour(s))  GLUCOSE, CAPILLARY     Status: Abnormal   Collection Time   07/16/12  2:52 PM      Component Value Range Comment   Glucose-Capillary 116 (*) 70 - 99 mg/dL   MRSA PCR SCREENING     Status: Normal   Collection Time   07/16/12  3:54 PM      Component Value Range Comment   MRSA by PCR NEGATIVE  NEGATIVE   URINE CULTURE     Status: Normal   Collection Time   07/16/12  3:54 PM      Component Value Range Comment   Specimen Description URINE, CATHETERIZED      Special Requests NONE      Culture  Setup Time 07/16/2012 16:54      Colony Count NO GROWTH      Culture NO GROWTH      Report Status 07/17/2012 FINAL     URINALYSIS, ROUTINE W REFLEX MICROSCOPIC     Status: Abnormal   Collection Time   07/16/12  3:54 PM      Component Value Range Comment   Color, Urine YELLOW  YELLOW    APPearance CLOUDY (*) CLEAR    Specific Gravity, Urine 1.019  1.005 - 1.030     pH 8.5 (*) 5.0 - 8.0    Glucose, UA 250 (*) NEGATIVE mg/dL    Hgb urine dipstick NEGATIVE  NEGATIVE    Bilirubin Urine NEGATIVE  NEGATIVE    Ketones, ur NEGATIVE  NEGATIVE mg/dL    Protein, ur NEGATIVE  NEGATIVE mg/dL    Urobilinogen, UA 2.0 (*) 0.0 - 1.0 mg/dL    Nitrite NEGATIVE  NEGATIVE    Leukocytes, UA NEGATIVE  NEGATIVE MICROSCOPIC NOT DONE ON URINES WITH NEGATIVE PROTEIN, BLOOD, LEUKOCYTES, NITRITE, OR GLUCOSE <1000 mg/dL.  GLUCOSE, CAPILLARY     Status: Abnormal   Collection Time   07/16/12  8:26 PM      Component Value Range Comment   Glucose-Capillary 116 (*) 70 - 99 mg/dL   GLUCOSE, CAPILLARY     Status: Abnormal   Collection Time   07/17/12 12:13 AM      Component Value Range Comment   Glucose-Capillary 104 (*) 70 - 99 mg/dL    Comment 1 Documented in Chart      Comment 2 Notify RN     POCT I-STAT, CHEM 8     Status: Abnormal   Collection Time   07/17/12 12:22 AM  Component Value Range Comment   Sodium 135  135 - 145 mEq/L    Potassium 4.3  3.5 - 5.1 mEq/L    Chloride 100  96 - 112 mEq/L    BUN 12  6 - 23 mg/dL    Creatinine, Ser 1.61  0.50 - 1.35 mg/dL    Glucose, Bld 096 (*) 70 - 99 mg/dL    Calcium, Ion 0.45  4.09 - 1.23 mmol/L    TCO2 28  0 - 100 mmol/L    Hemoglobin 10.9 (*) 13.0 - 17.0 g/dL    HCT 81.1 (*) 91.4 - 52.0 %   GLUCOSE, CAPILLARY     Status: Abnormal   Collection Time   07/17/12  3:33 AM      Component Value Range Comment   Glucose-Capillary 103 (*) 70 - 99 mg/dL    Comment 1 Documented in Chart      Comment 2 Notify RN     CBC     Status: Abnormal   Collection Time   07/17/12  4:14 AM      Component Value Range Comment   WBC 4.0  4.0 - 10.5 K/uL    RBC 3.58 (*) 4.22 - 5.81 MIL/uL    Hemoglobin 10.8 (*) 13.0 - 17.0 g/dL    HCT 78.2 (*) 95.6 - 52.0 %    MCV 89.4  78.0 - 100.0 fL    MCH 30.2  26.0 - 34.0 pg    MCHC 33.8  30.0 - 36.0 g/dL    RDW 21.3  08.6 - 57.8 %    Platelets 127 (*) 150 - 400 K/uL   BASIC METABOLIC PANEL     Status:  Abnormal   Collection Time   07/17/12  4:14 AM      Component Value Range Comment   Sodium 134 (*) 135 - 145 mEq/L    Potassium 4.0  3.5 - 5.1 mEq/L    Chloride 104  96 - 112 mEq/L    CO2 25  19 - 32 mEq/L    Glucose, Bld 108 (*) 70 - 99 mg/dL    BUN 12  6 - 23 mg/dL    Creatinine, Ser 4.69 (*) 0.50 - 1.35 mg/dL    Calcium 8.0 (*) 8.4 - 10.5 mg/dL    GFR calc non Af Amer >90  >90 mL/min    GFR calc Af Amer >90  >90 mL/min   MAGNESIUM     Status: Normal   Collection Time   07/17/12  4:14 AM      Component Value Range Comment   Magnesium 2.0  1.5 - 2.5 mg/dL   PHOSPHORUS     Status: Normal   Collection Time   07/17/12  4:14 AM      Component Value Range Comment   Phosphorus 2.7  2.3 - 4.6 mg/dL   GLUCOSE, CAPILLARY     Status: Abnormal   Collection Time   07/17/12  7:11 AM      Component Value Range Comment   Glucose-Capillary 110 (*) 70 - 99 mg/dL   GLUCOSE, CAPILLARY     Status: Normal   Collection Time   07/17/12 12:26 PM      Component Value Range Comment   Glucose-Capillary 90  70 - 99 mg/dL   GLUCOSE, CAPILLARY     Status: Abnormal   Collection Time   07/17/12  4:29 PM      Component Value Range Comment   Glucose-Capillary 100 (*) 70 -  99 mg/dL    Comment 1 Notify RN     GLUCOSE, CAPILLARY     Status: Normal   Collection Time   07/17/12  8:40 PM      Component Value Range Comment   Glucose-Capillary 99  70 - 99 mg/dL    Comment 1 Notify RN     GLUCOSE, CAPILLARY     Status: Abnormal   Collection Time   07/17/12 11:37 PM      Component Value Range Comment   Glucose-Capillary 112 (*) 70 - 99 mg/dL   GLUCOSE, CAPILLARY     Status: Abnormal   Collection Time   07/18/12  3:14 AM      Component Value Range Comment   Glucose-Capillary 101 (*) 70 - 99 mg/dL   BASIC METABOLIC PANEL     Status: Abnormal   Collection Time   07/18/12  4:30 AM      Component Value Range Comment   Sodium 134 (*) 135 - 145 mEq/L    Potassium 3.9  3.5 - 5.1 mEq/L    Chloride 101  96 - 112 mEq/L    CO2  28  19 - 32 mEq/L    Glucose, Bld 95  70 - 99 mg/dL    BUN 16  6 - 23 mg/dL    Creatinine, Ser 4.69  0.50 - 1.35 mg/dL    Calcium 8.5  8.4 - 62.9 mg/dL    GFR calc non Af Amer >90  >90 mL/min    GFR calc Af Amer >90  >90 mL/min   CBC     Status: Abnormal   Collection Time   07/18/12  4:30 AM      Component Value Range Comment   WBC 4.3  4.0 - 10.5 K/uL    RBC 3.82 (*) 4.22 - 5.81 MIL/uL    Hemoglobin 11.4 (*) 13.0 - 17.0 g/dL    HCT 52.8 (*) 41.3 - 52.0 %    MCV 89.0  78.0 - 100.0 fL    MCH 29.8  26.0 - 34.0 pg    MCHC 33.5  30.0 - 36.0 g/dL    RDW 24.4  01.0 - 27.2 %    Platelets 142 (*) 150 - 400 K/uL   GLUCOSE, CAPILLARY     Status: Normal   Collection Time   07/18/12  8:29 AM      Component Value Range Comment   Glucose-Capillary 94  70 - 99 mg/dL     Studies/Results: Dg Chest Port 1 View  07/18/2012  *RADIOLOGY REPORT*  Clinical Data: Pneumonia.  Pleural effusion.  PORTABLE CHEST - 1 VIEW  Comparison: 07/17/2012  Findings: Endotracheal tube tip is 16 mm above the carina and could be slightly retracted.  Central venous catheters and NG tube appear unchanged.  Pulmonary vascular prominence has resolved.  Left lung is now clear.  Improved aeration in the right upper lobe.  Persistent atelectasis / consolidation of the right lower lobe with an adjacent  small effusion.  IMPRESSION: Improved pulmonary vascularity.  Improved aeration in the right upper lobe.  Persistent effusion and atelectasis / consolidation of the right lower lobe.   Original Report Authenticated By: Francene Boyers, M.D.    Dg Chest Port 1 View  07/17/2012  *RADIOLOGY REPORT*  Clinical Data: Follow up pleural effusions, shortness of breath  PORTABLE CHEST - 1 VIEW  Comparison: /5/14  Findings: The heart and pulmonary vascularity are stable. Persistent consolidation in the right lower lobe is noted with  associated effusion.  A dialysis catheter and left-sided subclavian catheter are again seen and stable.  An endotracheal  tube and nasogastric catheter are also stable.  IMPRESSION: The overall appearance is stable from prior exam.  No new focal abnormality is noted.   Original Report Authenticated By: Alcide Clever, M.D.    Dg Chest Port 1 View  07/16/2012  *RADIOLOGY REPORT*  Clinical Data: Dialysis catheter placement  PORTABLE CHEST - 1 VIEW  Comparison: 07/16/2012  Findings: New right internal jugular vein dialysis catheter placed with its tip in lower SVC.  No pneumothorax. Mediastinum is not shifted to the right worrisome for increasing collapse of the right lower lobe.  Left lung is grossly clear.  Endotracheal tube and left subclavian central venous catheter are stable.  Stable NG tube.  IMPRESSION: New right internal jugular vein dialysis catheter placed with its tip in the SVC and no pneumothorax.  Increasing collapse of the right lower lobe.   Original Report Authenticated By: Jolaine Click, M.D.     Medications: I have reviewed the patient's current medications.  Assessment/Plan: Exacerbation of myasthenia gravis with primarily bulbar dysfunction. His been a slight improvement with his first course of plasmapheresis. Patient is due for the second of 5 plasma exchange treatments on 06/18/2012.  We'll continue current dose of Mestinon as well as planned subsequent treatments with plasmapheresis.  C.R. Roseanne Reno, MD [facility]  07/18/2012  9:00 AM

## 2012-07-19 ENCOUNTER — Inpatient Hospital Stay (HOSPITAL_COMMUNITY): Payer: Medicaid Other

## 2012-07-19 LAB — GLUCOSE, CAPILLARY: Glucose-Capillary: 118 mg/dL — ABNORMAL HIGH (ref 70–99)

## 2012-07-19 LAB — CBC
MCH: 30.1 pg (ref 26.0–34.0)
MCHC: 33.7 g/dL (ref 30.0–36.0)
Platelets: 138 10*3/uL — ABNORMAL LOW (ref 150–400)

## 2012-07-19 LAB — BASIC METABOLIC PANEL
BUN: 19 mg/dL (ref 6–23)
Calcium: 8.6 mg/dL (ref 8.4–10.5)
GFR calc Af Amer: >90 mL/min (ref >90)
GFR calc non Af Amer: >90 mL/min (ref >90)
Glucose, Bld: 108 mg/dL — ABNORMAL HIGH (ref 70–99)
Sodium: 135 mEq/L (ref 135–145)

## 2012-07-19 MED ORDER — PYRIDOSTIGMINE BROMIDE 60 MG/5ML PO SYRP
40.0000 mg | ORAL_SOLUTION | ORAL | Status: DC
  Administered 2012-07-19 – 2012-07-27 (×38): 39.6 mg via ORAL
  Filled 2012-07-19 (×55): qty 3.3

## 2012-07-19 NOTE — Progress Notes (Signed)
Placed pt on cpap;psv on 5/5. Pt tolerating well at this time. Vital signs stable. RT will continue to monitor.

## 2012-07-19 NOTE — Progress Notes (Signed)
PULMONARY  / CRITICAL CARE MEDICINE  Name: Victor Durham MRN: [id number] DOB: 1955-08-16    ADMISSION DATE:  07/10/2012 CONSULTATION DATE:  1/30/20143  REFERRING MD :  Juleen China  CHIEF COMPLAINT:  Dysphagia  BRIEF PATIENT DESCRIPTION: 57 y/o homeless male presented to the [facility] ED On 1/30 for the third time in two weeks for evaluation of dysphagia.  He complained of some difficulty breathing and underwent a neck CT.  Shortly after that procedure he developed respiratory and cardiac arrest.  PCCM called for admission.  SIGNIFICANT EVENTS / STUDIES:  1/30 respiratory and cardiac arrest in ED 1/30 CT head: hypoattenuation R midbrain extending into L pons, poorly characterized due to streak artifact 130 Echocardiogram: LVEF normal. Mild to moderate RV dilatation 1/31 EGD gastritis and esophagitis but no mass or bleeding source 1/31 LE venous duplex scans: LLE DVT 1/31 Thick secretions -plugged ETT during MRI 1/31 MRI -Scattered subcortical T2 hyperintensities -nonspecific, chronic microvascular ischemia vs a demyelinating process  2/1 Neurology consult: concern for myasthenia gravis 2/1 CT chest - no thymoma or central PE, Bilateral lower lobe consolidation and atelectasis , Nonspecific enhancing solid 4.1 cm mass exophytic from segment 2 of the left hepatic lobe  2/2 Mestinon started - intolerant due to excessive secretions 2/3 IVIG initiated by Neuro 2/4 Acetylcholine Receptor Ab 127.00 (<=0.30 mmol/L) 2/4 MRI cervical spine >> normal 2/4 Mestinon retried at a decreased dose 2-4 tx to cone. 2-7 Bronchoscopy  LINES / TUBES: 1/30 ETT >>  1/30 L [place] CVL >> 2/5  RtI J HD Cath insertion>>   CULTURES: 1/30 blood >> NEG 1/30 urine >> NEG 2-2 sputum>> NEG 2-1 bc x 2 >> NEG 2-7 BAL >>   ANTIBIOTICS: unasyn 1/30 >> 2/4 levaquin 1/30 >> 2/4  SUBJECTIVE:  Tolerating pressure support.  Still with increased secretions.  VITAL SIGNS: Temp:  [97 F (36.1 C)-99 F (37.2 C)] 98.7 F  (37.1 C) (02/08 0750) Pulse Rate:  [51-76] 76 (02/08 0900) Resp:  [14-23] 22 (02/08 0900) BP: (82-111)/(40-83) 98/83 mmHg (02/08 0900) SpO2:  [89 %-100 %] 95 % (02/08 0900) FiO2 (%):  [39.6 %-50.3 %] 39.6 % (02/08 0900) Weight:  [191 lb 2.2 oz (86.7 kg)] 191 lb 2.2 oz (86.7 kg) (02/08 0500) VENTILATOR SETTINGS: Vent Mode:  [-] CPAP FiO2 (%):  [39.6 %-50.3 %] 39.6 % Set Rate:  [14 bmp] 14 bmp Vt Set:  [620 mL] 620 mL PEEP:  [5 cmH20] 5 cmH20 Pressure Support:  [5 cmH20] 5 cmH20 Plateau Pressure:  [16 cmH20-19 cmH20] 17 cmH20 INTAKE / OUTPUT: Intake/Output     02/07 0701 - 02/08 0700 02/08 0701 - 02/09 0700   I.V. (mL/kg) 280 (3.2) 40 (0.5)   NG/GT 2040 230   IV Piggyback 290    Total Intake(mL/kg) 2610 (30.1) 270 (3.1)   Urine (mL/kg/hr) 2105 (1) 150 (0.6)   Total Output 2105 150   Net +505 +120          PHYSICAL EXAMINATION: Gen: No distress HEENT: ETT in place PULM: scattered rhonchi, diminished in Rt base, no wheeze CV: s1s2 regular, no murmur AB: Soft, non tender Ext: 2+ symmetric non-pitting edema Neuro: moves extremities, follows commands  LABS:  Recent Labs Lab 07/15/12 0346  07/17/12 0414 07/18/12 0430 07/19/12 0500  HGB 11.6*  < > 10.8* 11.4* 11.0*  WBC 4.5  < > 4.0 4.3 5.2  PLT 136*  < > 127* 142* 138*  NA 134*  < > 134* 134* 135  K 3.5  < >  4.0 3.9 3.8  CL 104  < > 104 101 102  CO2 23  < > 25 28 26   GLUCOSE 124*  < > 108* 95 108*  BUN 14  < > 12 16 19   CREATININE 0.52  < > 0.49* 0.51 0.51  CALCIUM 7.8*  < > 8.0* 8.5 8.6  MG 2.0  --  2.0  --   --   PHOS 1.9*  --  2.7  --   --   < > = values in this interval not displayed.  Recent Labs Lab 07/18/12 0314 07/18/12 0829 07/18/12 1121 07/18/12 1629 07/18/12 2010  GLUCAP 101* 94 101* 101* 118*    CXR: Dg Chest Port 1 View  07/19/2012  *RADIOLOGY REPORT*  Clinical Data: Aspiration pneumonia, intubated  PORTABLE CHEST - 1 VIEW  Comparison: Prior chest x-ray 07/18/2012  Findings: The  endotracheal tube is stable in position 2 cm above the carina.  Right IJ vascular sheath, left subclavian central venous catheter and nasogastric tube are all in unchanged position.  Improving dense right lower lobe opacity and associated volume loss.  Stable cardiomegaly.  The remainder the lungs are relatively clear.  IMPRESSION: 1.  Improving right lower lobe opacity. 2.  Stable and satisfactory support apparatus   Original Report Authenticated By: Malachy Moan, M.D.    Dg Chest Port 1 View  07/18/2012  *RADIOLOGY REPORT*  Clinical Data: Pneumonia.  Pleural effusion.  PORTABLE CHEST - 1 VIEW  Comparison: 07/17/2012  Findings: Endotracheal tube tip is 16 mm above the carina and could be slightly retracted.  Central venous catheters and NG tube appear unchanged.  Pulmonary vascular prominence has resolved.  Left lung is now clear.  Improved aeration in the right upper lobe.  Persistent atelectasis / consolidation of the right lower lobe with an adjacent  small effusion.  IMPRESSION: Improved pulmonary vascularity.  Improved aeration in the right upper lobe.  Persistent effusion and atelectasis / consolidation of the right lower lobe.   Original Report Authenticated By: Francene Boyers, M.D.     ASSESSMENT / PLAN:  PULMONARY A: Acute respiratory failure due to Myasthenia Gravis, and Aspiration PNA. P:   Pressure support wean as tolerated Assess NIF prior to extubation >> needs to be greater than -20 cm H2O F/u CXR  CARDIOVASCULAR A: LLE DVT P:  Lovenox ASA 81mg   RENAL A:  Volume overload. P:   Negative fluid balance as tolerated F/u renal fx F/u and replace electrolytes as needed  GASTROINTESTINAL A:  Dysphagia due to MG. Nutrition. P:   Continue tube feeds while on vent Will need swallow evaluation after extubation  HEMATOLOGIC A: Anemia of critical illness. Thrombocytopenia. P:  F/u CBC Transfuse for Hb < 7  INFECTIOUS A:  Aspiration PNA. Completed Abx 2/04. P:    Monitor clinically  ENDOCRINE A:  Hyperglycemia. P:   Monitor blood sugar on BMET  NEUROLOGIC A: Myasthenia Gravis P:   S/p IVIG mestinon  plasma exchange per neurology, 2nd treatment 2/8  Family contact:  Rohith Oberholzer [name] [phone]; back up contact for father: [name]: [phone]  Resolved problems >> cardiac arrest 2nd to respiratory failure  Updated family at bedside.  CC time 35 minutes.  Coralyn Helling, MD [facility] Pulmonary/Critical Care 07/19/2012, 10:01 AM Pager:  [phone] After 3pm call: [phone]

## 2012-07-19 NOTE — Progress Notes (Signed)
Subjective: Patient bases improved slightly over the past couple days. He has received 2 of 5 planned courses of plasmapheresis and tolerates the procedures well. Negative inspiratory force measurement is pending.  Objective: Current vital signs: BP 103/49  Pulse 57  Temp(Src) 98.3 F (36.8 C) (Oral)  Resp 18  Ht 6' (1.829 m)  Wt 86.7 kg (191 lb 2.2 oz)  BMI 25.92 kg/m2  SpO2 98%  Neurologic Exam: Alert and in no acute distress. Able to respond readily with gestures or with writing. Extraocular movements were full and conjugate. No no diplopia in any field of gaze. Improved strength of his frontalis as well as orbicularis oculi muscles. Mild ptosis of right eyelid and mild to moderate doses of left eyelid. Neck flexor strength was 4 minus over 5. Strength of his extremities was normal proximally and distally.  Lab Results: Results for orders placed during the hospital encounter of 07/10/12 (from the past 48 hour(s))  GLUCOSE, CAPILLARY     Status: Abnormal   Collection Time    07/17/12  4:29 PM      Result Value Range   Glucose-Capillary 100 (*) 70 - 99 mg/dL   Comment 1 Notify RN    GLUCOSE, CAPILLARY     Status: None   Collection Time    07/17/12  8:40 PM      Result Value Range   Glucose-Capillary 99  70 - 99 mg/dL   Comment 1 Notify RN    GLUCOSE, CAPILLARY     Status: Abnormal   Collection Time    07/17/12 11:37 PM      Result Value Range   Glucose-Capillary 112 (*) 70 - 99 mg/dL  GLUCOSE, CAPILLARY     Status: Abnormal   Collection Time    07/18/12  3:14 AM      Result Value Range   Glucose-Capillary 101 (*) 70 - 99 mg/dL  BASIC METABOLIC PANEL     Status: Abnormal   Collection Time    07/18/12  4:30 AM      Result Value Range   Sodium 134 (*) 135 - 145 mEq/L   Potassium 3.9  3.5 - 5.1 mEq/L   Chloride 101  96 - 112 mEq/L   CO2 28  19 - 32 mEq/L   Glucose, Bld 95  70 - 99 mg/dL   BUN 16  6 - 23 mg/dL   Creatinine, Ser 8.11  0.50 - 1.35 mg/dL   Calcium  8.5  8.4 - 91.4 mg/dL   GFR calc non Af Amer >90  >90 mL/min   GFR calc Af Amer >90  >90 mL/min   Comment:            The eGFR has been calculated     using the CKD EPI equation.     This calculation has not been     validated in all clinical     situations.     eGFR's persistently     <90 mL/min signify     possible Chronic Kidney Disease.  CBC     Status: Abnormal   Collection Time    07/18/12  4:30 AM      Result Value Range   WBC 4.3  4.0 - 10.5 K/uL   RBC 3.82 (*) 4.22 - 5.81 MIL/uL   Hemoglobin 11.4 (*) 13.0 - 17.0 g/dL   HCT 78.2 (*) 95.6 - 21.3 %   MCV 89.0  78.0 - 100.0 fL   MCH 29.8  26.0 -  34.0 pg   MCHC 33.5  30.0 - 36.0 g/dL   RDW 40.9  81.1 - 91.4 %   Platelets 142 (*) 150 - 400 K/uL  GLUCOSE, CAPILLARY     Status: None   Collection Time    07/18/12  8:29 AM      Result Value Range   Glucose-Capillary 94  70 - 99 mg/dL  GLUCOSE, CAPILLARY     Status: Abnormal   Collection Time    07/18/12 11:21 AM      Result Value Range   Glucose-Capillary 101 (*) 70 - 99 mg/dL  CULTURE, BAL-QUANTITATIVE     Status: None   Collection Time    07/18/12 12:41 PM      Result Value Range   Specimen Description BRONCHIAL ALVEOLAR LAVAGE     Special Requests RLL     Gram Stain       Value: RARE WBC PRESENT, PREDOMINANTLY PMN     RARE SQUAMOUS EPITHELIAL CELLS PRESENT     NO ORGANISMS SEEN   Colony Count PENDING     Culture Culture reincubated for better growth     Report Status PENDING    GLUCOSE, CAPILLARY     Status: Abnormal   Collection Time    07/18/12  4:29 PM      Result Value Range   Glucose-Capillary 101 (*) 70 - 99 mg/dL  GLUCOSE, CAPILLARY     Status: Abnormal   Collection Time    07/18/12  8:10 PM      Result Value Range   Glucose-Capillary 118 (*) 70 - 99 mg/dL  BASIC METABOLIC PANEL     Status: Abnormal   Collection Time    07/19/12  5:00 AM      Result Value Range   Sodium 135  135 - 145 mEq/L   Potassium 3.8  3.5 - 5.1 mEq/L   Chloride 102  96 -  112 mEq/L   CO2 26  19 - 32 mEq/L   Glucose, Bld 108 (*) 70 - 99 mg/dL   BUN 19  6 - 23 mg/dL   Creatinine, Ser 7.82  0.50 - 1.35 mg/dL   Calcium 8.6  8.4 - 95.6 mg/dL   GFR calc non Af Amer >90  >90 mL/min   GFR calc Af Amer >90  >90 mL/min   Comment:            The eGFR has been calculated     using the CKD EPI equation.     This calculation has not been     validated in all clinical     situations.     eGFR's persistently     <90 mL/min signify     possible Chronic Kidney Disease.  CBC     Status: Abnormal   Collection Time    07/19/12  5:00 AM      Result Value Range   WBC 5.2  4.0 - 10.5 K/uL   RBC 3.66 (*) 4.22 - 5.81 MIL/uL   Hemoglobin 11.0 (*) 13.0 - 17.0 g/dL   HCT 21.3 (*) 08.6 - 57.8 %   MCV 89.1  78.0 - 100.0 fL   MCH 30.1  26.0 - 34.0 pg   MCHC 33.7  30.0 - 36.0 g/dL   RDW 46.9  62.9 - 52.8 %   Platelets 138 (*) 150 - 400 K/uL    Studies/Results: Dg Chest Port 1 View  07/19/2012  *RADIOLOGY REPORT*  Clinical Data: Aspiration pneumonia, intubated  PORTABLE CHEST - 1 VIEW  Comparison: Prior chest x-ray 07/18/2012  Findings: The endotracheal tube is stable in position 2 cm above the carina.  Right IJ vascular sheath, left subclavian central venous catheter and nasogastric tube are all in unchanged position.  Improving dense right lower lobe opacity and associated volume loss.  Stable cardiomegaly.  The remainder the lungs are relatively clear.  IMPRESSION: 1.  Improving right lower lobe opacity. 2.  Stable and satisfactory support apparatus   Original Report Authenticated By: Malachy Moan, M.D.    Dg Chest Port 1 View  07/18/2012  *RADIOLOGY REPORT*  Clinical Data: Pneumonia.  Pleural effusion.  PORTABLE CHEST - 1 VIEW  Comparison: 07/17/2012  Findings: Endotracheal tube tip is 16 mm above the carina and could be slightly retracted.  Central venous catheters and NG tube appear unchanged.  Pulmonary vascular prominence has resolved.  Left lung is now clear.  Improved  aeration in the right upper lobe.  Persistent atelectasis / consolidation of the right lower lobe with an adjacent  small effusion.  IMPRESSION: Improved pulmonary vascularity.  Improved aeration in the right upper lobe.  Persistent effusion and atelectasis / consolidation of the right lower lobe.   Original Report Authenticated By: Francene Boyers, M.D.     Medications:  I have reviewed the patient's current medications. Scheduled: . antiseptic oral rinse  15 mL Mouth Rinse QID  . aspirin  81 mg Per Tube Daily  . chlorhexidine  15 mL Mouth Rinse BID  . enoxaparin (LOVENOX) injection  90 mg Subcutaneous Q12H  . heparin lock flush  500 Units Intravenous Once  . pantoprazole sodium  40 mg Per Tube Daily  . pyridostigmine  39.6 mg Oral Q4H  . sodium chloride  10-40 mL Intracatheter Q12H   ZOX:WRUEAV chloride, acetaminophen, calcium carbonate, calcium gluconate IVPB, calcium gluconate IVPB, calcium gluconate, diphenhydrAMINE, fentaNYL, midazolam, sodium chloride  Assessment/Plan: Myasthenia gravis exacerbation with severe bulbar weakness with slow improvement with plasma exchange. Patient had been given a course of IVIG previously which was not a significant benefit. He's tolerating plasmapheresis well at this point.  Plan: Patient will continue with plasmapheresis for total of 5 exchanges. Intubation and ventilatory support to continue as needed. I will increase Mestinon to 40 mg every 4 hours. We will continue to follow him closely with you.  C.R. Roseanne Reno, MD [facility] [phone]  07/19/2012  1:58 PM

## 2012-07-19 NOTE — Progress Notes (Signed)
NIF (-25) and VC (1800) taken.  Pt. Has good effort.

## 2012-07-19 NOTE — Progress Notes (Signed)
ANTICOAGULATION CONSULT NOTE - Follow Up Consult  Pharmacy Consult:  Lovenox Indication: DVT  No Known Allergies  Patient Measurements: Height: 6' (182.9 cm) Weight: 191 lb 2.2 oz (86.7 kg) IBW/kg (Calculated) : 77.6  Vital Signs: Temp: 98.3 F (36.8 C) (02/08 1140) Temp src: Oral (02/08 1140) BP: 98/51 mmHg (02/08 1000) Pulse Rate: 65 (02/08 1100)  Labs:  Recent Labs  07/17/12 0414 07/18/12 0430 07/19/12 0500  HGB 10.8* 11.4* 11.0*  HCT 32.0* 34.0* 32.6*  PLT 127* 142* 138*  CREATININE 0.49* 0.51 0.51    Estimated Creatinine Clearance: 113.2 ml/min (by C-G formula based on Cr of 0.51).     Assessment: 57 y/o homeless male presented to the [facility] ED On 07/10/12 for the third time in two weeks for evaluation of dysphagia. He complained of some difficulty breathing and underwent a neck CT. Shortly after that procedure he developed respiratory and cardiac arrest.  On 07/16/2012 he transferred to [facility] for plasmapheresis.  Pharmacy consulted to manage Lovenox for LLE DVT.  Patient's renal function has been stable.   Goal of Therapy: Anti-Xa level 0.6-1.2 units/ml 4hrs after LMWH dose given   Plan: - Lovenox 90mg  SQ Q12H  - CBC Q72H while on Lovenox - F/U oral anticoagulation plan post extubation    Melanni Benway D. Laney Potash, PharmD, BCPS Pager:  [phone] 07/19/2012, 12:04 PM

## 2012-07-20 ENCOUNTER — Inpatient Hospital Stay (HOSPITAL_COMMUNITY): Payer: Medicaid Other

## 2012-07-20 LAB — CULTURE, BAL-QUANTITATIVE W GRAM STAIN

## 2012-07-20 LAB — CBC
Platelets: 166 10*3/uL (ref 150–400)
RBC: 3.88 MIL/uL — ABNORMAL LOW (ref 4.22–5.81)
WBC: 4.9 10*3/uL (ref 4.0–10.5)

## 2012-07-20 LAB — BASIC METABOLIC PANEL
Calcium: 8.7 mg/dL (ref 8.4–10.5)
GFR calc Af Amer: >90 mL/min (ref >90)
GFR calc non Af Amer: >90 mL/min (ref >90)
Sodium: 135 mEq/L (ref 135–145)

## 2012-07-20 LAB — GLUCOSE, CAPILLARY
Glucose-Capillary: 106 mg/dL — ABNORMAL HIGH (ref 70–99)
Glucose-Capillary: 93 mg/dL (ref 70–99)

## 2012-07-20 MED ORDER — SODIUM CHLORIDE 0.9 % IV SOLN: Freq: Once | INTRAVENOUS | Status: DC

## 2012-07-20 MED ORDER — ALBUTEROL SULFATE (5 MG/ML) 0.5% IN NEBU
2.5000 mg | INHALATION_SOLUTION | Freq: Three times a day (TID) | RESPIRATORY_TRACT | Status: DC
  Administered 2012-07-20 – 2012-07-25 (×15): 2.5 mg via RESPIRATORY_TRACT
  Filled 2012-07-20 (×17): qty 0.5

## 2012-07-20 MED ORDER — CALCIUM CARBONATE ANTACID 500 MG PO CHEW
2.0000 | CHEWABLE_TABLET | ORAL | Status: DC | PRN
  Filled 2012-07-20: qty 2

## 2012-07-20 MED ORDER — FUROSEMIDE 10 MG/ML IJ SOLN
40.0000 mg | Freq: Once | INTRAMUSCULAR | Status: AC
  Administered 2012-07-20: 40 mg via INTRAVENOUS
  Filled 2012-07-20: qty 4

## 2012-07-20 MED ORDER — DIPHENHYDRAMINE HCL 25 MG PO CAPS
25.0000 mg | ORAL_CAPSULE | Freq: Four times a day (QID) | ORAL | Status: DC | PRN
  Filled 2012-07-20: qty 1

## 2012-07-20 MED ORDER — SODIUM CHLORIDE 0.9 % IV SOLN
4.0000 g | INTRAVENOUS | Status: DC | PRN
  Administered 2012-07-20 – 2012-07-25 (×3): 4 g via INTRAVENOUS
  Filled 2012-07-20 (×4): qty 40

## 2012-07-20 MED ORDER — PANTOPRAZOLE SODIUM 40 MG IV SOLR
40.0000 mg | INTRAVENOUS | Status: DC
  Administered 2012-07-20 – 2012-07-22 (×3): 40 mg via INTRAVENOUS
  Filled 2012-07-20 (×4): qty 40

## 2012-07-20 MED ORDER — SODIUM CHLORIDE 0.9 % IV SOLN
INTRAVENOUS | Status: AC
  Administered 2012-07-20 – 2012-07-21 (×4): via INTRAVENOUS_CENTRAL
  Filled 2012-07-20 (×4): qty 200

## 2012-07-20 MED ORDER — SODIUM CHLORIDE 0.9 % IV SOLN
2.0000 g | INTRAVENOUS | Status: DC | PRN
  Filled 2012-07-20: qty 20

## 2012-07-20 MED ORDER — HEPARIN SODIUM (PORCINE) 1000 UNIT/ML IJ SOLN: 1000.0000 [IU] | Freq: Once | INTRAMUSCULAR | Status: DC

## 2012-07-20 MED ORDER — CALCIUM CARBONATE ANTACID 500 MG PO CHEW
2.0000 | CHEWABLE_TABLET | ORAL | Status: DC
  Administered 2012-07-20: 400 mg via ORAL
  Filled 2012-07-20 (×2): qty 2

## 2012-07-20 MED ORDER — ACETAMINOPHEN 325 MG PO TABS: 650.0000 mg | ORAL_TABLET | ORAL | Status: DC | PRN

## 2012-07-20 MED ORDER — CALCIUM CARBONATE ANTACID 500 MG PO CHEW
2.0000 | CHEWABLE_TABLET | ORAL | Status: DC
  Filled 2012-07-20 (×2): qty 2

## 2012-07-20 MED ORDER — CALCIUM GLUCONATE 10 % IV SOLN
2.0000 g | INTRAVENOUS | Status: DC | PRN
  Filled 2012-07-20: qty 20

## 2012-07-20 MED ORDER — ACD FORMULA A 0.73-2.45-2.2 GM/100ML VI SOLN
Status: AC
  Administered 2012-07-20: 500 mL via INTRAVENOUS
  Filled 2012-07-20: qty 500

## 2012-07-20 MED ORDER — ASPIRIN 81 MG PO CHEW
81.0000 mg | CHEWABLE_TABLET | Freq: Every day | ORAL | Status: DC
  Administered 2012-07-21 – 2012-08-06 (×16): 81 mg via ORAL
  Filled 2012-07-20 (×17): qty 1

## 2012-07-20 NOTE — Progress Notes (Signed)
PULMONARY  / CRITICAL CARE MEDICINE  Name: Victor Durham MRN: [id number] DOB: 1956-01-04    ADMISSION DATE:  07/10/2012 CONSULTATION DATE:  1/30/20143  REFERRING MD :  Juleen China  CHIEF COMPLAINT:  Dysphagia  BRIEF PATIENT DESCRIPTION: 57 y/o homeless male presented to the [facility] ED On 1/30 for the third time in two weeks for evaluation of dysphagia.  He complained of some difficulty breathing and underwent a neck CT.  Shortly after that procedure he developed respiratory and cardiac arrest.  PCCM called for admission.  SIGNIFICANT EVENTS / STUDIES:  1/30 respiratory and cardiac arrest in ED 1/30 CT head: hypoattenuation R midbrain extending into L pons, poorly characterized due to streak artifact 130 Echocardiogram: LVEF normal. Mild to moderate RV dilatation 1/31 EGD gastritis and esophagitis but no mass or bleeding source 1/31 LE venous duplex scans: LLE DVT 1/31 Thick secretions -plugged ETT during MRI 1/31 MRI -Scattered subcortical T2 hyperintensities -nonspecific, chronic microvascular ischemia vs a demyelinating process  2/1 Neurology consult: concern for myasthenia gravis 2/1 CT chest - no thymoma or central PE, Bilateral lower lobe consolidation and atelectasis , Nonspecific enhancing solid 4.1 cm mass exophytic from segment 2 of the left hepatic lobe  2/2 Mestinon started - intolerant due to excessive secretions 2/3 IVIG initiated by Neuro 2/4 Acetylcholine Receptor Ab 127.00 (<=0.30 mmol/L) 2/4 MRI cervical spine >> normal 2/4 Mestinon retried at a decreased dose 2-4 tx to cone. 2-7 Bronchoscopy  LINES / TUBES: 1/30 ETT >> 2/9 1/30 L [place] CVL >> 2/5  RtI J HD Cath insertion>>   CULTURES: 1/30 blood >> NEG 1/30 urine >> NEG 2-2 sputum>> NEG 2-1 bc x 2 >> NEG 2-7 BAL >> oral flora  ANTIBIOTICS: unasyn 1/30 >> 2/4 levaquin 1/30 >> 2/4  SUBJECTIVE:  NIF, VC improved.  Tolerating SBT.  Reasonable cough.  VITAL SIGNS: Temp:  [98 F (36.7 C)-99 F (37.2 C)] 99 F  (37.2 C) (02/09 0738) Pulse Rate:  [54-65] 64 (02/09 0800) Resp:  [13-24] 18 (02/09 0800) BP: (90-105)/(48-60) 96/50 mmHg (02/09 0800) SpO2:  [95 %-100 %] 97 % (02/09 0800) FiO2 (%):  [39.8 %-40.3 %] 40.1 % (02/09 0800) Weight:  [187 lb 2.7 oz (84.9 kg)] 187 lb 2.7 oz (84.9 kg) (02/09 0400) VENTILATOR SETTINGS: Vent Mode:  [-] CPAP;PSV FiO2 (%):  [39.8 %-40.3 %] 40.1 % Set Rate:  [14 bmp] 14 bmp Vt Set:  [620 mL-6210 mL] 620 mL PEEP:  [5 cmH20] 5 cmH20 Pressure Support:  [5 cmH20-8 cmH20] 5 cmH20 Plateau Pressure:  [14 cmH20-21 cmH20] 21 cmH20 INTAKE / OUTPUT: Intake/Output     02/08 0701 - 02/09 0700 02/09 0701 - 02/10 0700   I.V. (mL/kg) 420 (4.9) 40 (0.5)   NG/GT 1965 170   IV Piggyback     Total Intake(mL/kg) 2385 (28.1) 210 (2.5)   Urine (mL/kg/hr) 2020 (1) 200 (0.8)   Total Output 2020 200   Net +365 +10          PHYSICAL EXAMINATION: Gen: No distress HEENT: ETT in place PULM: scattered rhonchi, diminished in Rt base, no wheeze CV: s1s2 regular, no murmur AB: Soft, non tender Ext: 2+ symmetric non-pitting edema Neuro: moves extremities, follows commands  LABS:  Recent Labs Lab 07/15/12 0346  07/17/12 0414 07/18/12 0430 07/19/12 0500 07/20/12 0428  HGB 11.6*  < > 10.8* 11.4* 11.0* 11.7*  WBC 4.5  < > 4.0 4.3 5.2 4.9  PLT 136*  < > 127* 142* 138* 166  NA 134*  < >  134* 134* 135 135  K 3.5  < > 4.0 3.9 3.8 3.8  CL 104  < > 104 101 102 100  CO2 23  < > 25 28 26 26   GLUCOSE 124*  < > 108* 95 108* 103*  BUN 14  < > 12 16 19 16   CREATININE 0.52  < > 0.49* 0.51 0.51 0.53  CALCIUM 7.8*  < > 8.0* 8.5 8.6 8.7  MG 2.0  --  2.0  --   --   --   PHOS 1.9*  --  2.7  --   --   --   < > = values in this interval not displayed.  Recent Labs Lab 07/18/12 2010 07/19/12 1956 07/20/12 0017 07/20/12 0414 07/20/12 0732  GLUCAP 118* 118* 91 90 106*    CXR: Dg Chest Port 1 View  07/20/2012  *RADIOLOGY REPORT*  Clinical Data: Pneumonia  PORTABLE CHEST - 1 VIEW   Comparison: Chest radiograph 02/08/ 2014  Findings: Endotracheal tube, NG tube, and right central venous line unchanged.  Stable cardiac silhouette. Left subclavian line also unchanged.  There is a moderate right effusion not changed from prior.  Upper lungs are clear.  No pneumothorax.  IMPRESSION:  1.  Stable support apparatus. 2.  No interval change.  3.  Right lower lobe atelectasis and effusion.   Original Report Authenticated By: Genevive Bi, M.D.    Dg Chest Port 1 View  07/19/2012  *RADIOLOGY REPORT*  Clinical Data: Aspiration pneumonia, intubated  PORTABLE CHEST - 1 VIEW  Comparison: Prior chest x-ray 07/18/2012  Findings: The endotracheal tube is stable in position 2 cm above the carina.  Right IJ vascular sheath, left subclavian central venous catheter and nasogastric tube are all in unchanged position.  Improving dense right lower lobe opacity and associated volume loss.  Stable cardiomegaly.  The remainder the lungs are relatively clear.  IMPRESSION: 1.  Improving right lower lobe opacity. 2.  Stable and satisfactory support apparatus   Original Report Authenticated By: Malachy Moan, M.D.     ASSESSMENT / PLAN:  PULMONARY A: Acute respiratory failure due to Myasthenia Gravis, and Aspiration PNA. P:   Extubate 2/9 BiPAP qhs and prn during day Monitor NIF, VC F/u CXR  CARDIOVASCULAR A: LLE DVT P:  Lovenox ASA 81mg   RENAL A:  Volume overload. P:   Negative fluid balance as tolerated >> lasix 40 mg IV x 1 on 2/9 F/u renal fx F/u and replace electrolytes as needed  GASTROINTESTINAL A:  Dysphagia due to MG. Nutrition. P:   Swallow evaluation by speech therapy   HEMATOLOGIC A: Anemia of critical illness. Thrombocytopenia >> resolved P:  F/u CBC Transfuse for Hb < 7  INFECTIOUS A:  Aspiration PNA. Completed Abx 2/04. P:   Monitor clinically  ENDOCRINE A:  Hyperglycemia. P:   Monitor blood sugar on BMET  NEUROLOGIC A: Myasthenia Gravis P:   S/p  IVIG mestinon  plasma exchange per neurology  Family contact:  Wyndell Cardiff [name] [phone]; back up contact for father: [name]: [phone]  Resolved problems >> cardiac arrest 2nd to respiratory failure  CC time 35 minutes.  Coralyn Helling, MD [facility] Pulmonary/Critical Care 07/20/2012, 10:04 AM Pager:  [phone] After 3pm call: [phone]

## 2012-07-20 NOTE — Progress Notes (Addendum)
Subjective: Patient feels he is essentially unchanged OS 24 hours with respect to facial strength and swallowing. His been a slight increase in saliva production with the increase in Mestinon to 40 mg every 4 hours. This has been tolerable, however. He has not experienced diplopia. Schedule for his third of 5 courses of plasmapheresis.  Objective: Current vital signs: BP 96/50  Pulse 64  Temp(Src) 99 F (37.2 C) (Oral)  Resp 18  Ht 6' (1.829 m)  Wt 84.9 kg (187 lb 2.7 oz)  BMI 25.38 kg/m2  SpO2 97%  Neurologic Exam: Intubated and in no acute distress, on mechanical ventilation. Alert and in no acute distress. Mental status was normal. Pupils and extraocular movements were normal. He had no diplopia in any field of gaze. Frontalis muscle strength was 4+ over 5. Orbicularis oris strength was 4 minus over 5. Strength of extremities was normal.  Lab Results: Results for orders placed during the hospital encounter of 07/10/12 (from the past 48 hour(s))  GLUCOSE, CAPILLARY     Status: Abnormal   Collection Time    07/18/12 11:21 AM      Result Value Range   Glucose-Capillary 101 (*) 70 - 99 mg/dL  CULTURE, BAL-QUANTITATIVE     Status: None   Collection Time    07/18/12 12:41 PM      Result Value Range   Specimen Description BRONCHIAL ALVEOLAR LAVAGE     Special Requests RLL     Gram Stain       Value: RARE WBC PRESENT, PREDOMINANTLY PMN     RARE SQUAMOUS EPITHELIAL CELLS PRESENT     NO ORGANISMS SEEN   Colony Count PENDING     Culture Culture reincubated for better growth     Report Status PENDING    GLUCOSE, CAPILLARY     Status: Abnormal   Collection Time    07/18/12  4:29 PM      Result Value Range   Glucose-Capillary 101 (*) 70 - 99 mg/dL  GLUCOSE, CAPILLARY     Status: Abnormal   Collection Time    07/18/12  8:10 PM      Result Value Range   Glucose-Capillary 118 (*) 70 - 99 mg/dL  BASIC METABOLIC PANEL     Status: Abnormal   Collection Time    07/19/12  5:00 AM       Result Value Range   Sodium 135  135 - 145 mEq/L   Potassium 3.8  3.5 - 5.1 mEq/L   Chloride 102  96 - 112 mEq/L   CO2 26  19 - 32 mEq/L   Glucose, Bld 108 (*) 70 - 99 mg/dL   BUN 19  6 - 23 mg/dL   Creatinine, Ser 9.60  0.50 - 1.35 mg/dL   Calcium 8.6  8.4 - 45.4 mg/dL   GFR calc non Af Amer >90  >90 mL/min   GFR calc Af Amer >90  >90 mL/min   Comment:            The eGFR has been calculated     using the CKD EPI equation.     This calculation has not been     validated in all clinical     situations.     eGFR's persistently     <90 mL/min signify     possible Chronic Kidney Disease.  CBC     Status: Abnormal   Collection Time    07/19/12  5:00 AM      Result  Value Range   WBC 5.2  4.0 - 10.5 K/uL   RBC 3.66 (*) 4.22 - 5.81 MIL/uL   Hemoglobin 11.0 (*) 13.0 - 17.0 g/dL   HCT 16.1 (*) 09.6 - 04.5 %   MCV 89.1  78.0 - 100.0 fL   MCH 30.1  26.0 - 34.0 pg   MCHC 33.7  30.0 - 36.0 g/dL   RDW 40.9  81.1 - 91.4 %   Platelets 138 (*) 150 - 400 K/uL  GLUCOSE, CAPILLARY     Status: Abnormal   Collection Time    07/19/12  7:56 PM      Result Value Range   Glucose-Capillary 118 (*) 70 - 99 mg/dL   Comment 1 Notify RN    GLUCOSE, CAPILLARY     Status: None   Collection Time    07/20/12 12:17 AM      Result Value Range   Glucose-Capillary 91  70 - 99 mg/dL  GLUCOSE, CAPILLARY     Status: None   Collection Time    07/20/12  4:14 AM      Result Value Range   Glucose-Capillary 90  70 - 99 mg/dL  BASIC METABOLIC PANEL     Status: Abnormal   Collection Time    07/20/12  4:28 AM      Result Value Range   Sodium 135  135 - 145 mEq/L   Potassium 3.8  3.5 - 5.1 mEq/L   Chloride 100  96 - 112 mEq/L   CO2 26  19 - 32 mEq/L   Glucose, Bld 103 (*) 70 - 99 mg/dL   BUN 16  6 - 23 mg/dL   Creatinine, Ser 7.82  0.50 - 1.35 mg/dL   Calcium 8.7  8.4 - 95.6 mg/dL   GFR calc non Af Amer >90  >90 mL/min   GFR calc Af Amer >90  >90 mL/min   Comment:            The eGFR has been  calculated     using the CKD EPI equation.     This calculation has not been     validated in all clinical     situations.     eGFR's persistently     <90 mL/min signify     possible Chronic Kidney Disease.  CBC     Status: Abnormal   Collection Time    07/20/12  4:28 AM      Result Value Range   WBC 4.9  4.0 - 10.5 K/uL   RBC 3.88 (*) 4.22 - 5.81 MIL/uL   Hemoglobin 11.7 (*) 13.0 - 17.0 g/dL   HCT 21.3 (*) 08.6 - 57.8 %   MCV 89.2  78.0 - 100.0 fL   MCH 30.2  26.0 - 34.0 pg   MCHC 33.8  30.0 - 36.0 g/dL   RDW 46.9  62.9 - 52.8 %   Platelets 166  150 - 400 K/uL  GLUCOSE, CAPILLARY     Status: Abnormal   Collection Time    07/20/12  7:32 AM      Result Value Range   Glucose-Capillary 106 (*) 70 - 99 mg/dL    Studies/Results: Dg Chest Port 1 View  07/20/2012  *RADIOLOGY REPORT*  Clinical Data: Pneumonia  PORTABLE CHEST - 1 VIEW  Comparison: Chest radiograph 02/08/ 2014  Findings: Endotracheal tube, NG tube, and right central venous line unchanged.  Stable cardiac silhouette. Left subclavian line also unchanged.  There is a moderate right effusion  not changed from prior.  Upper lungs are clear.  No pneumothorax.  IMPRESSION:  1.  Stable support apparatus. 2.  No interval change.  3.  Right lower lobe atelectasis and effusion.   Original Report Authenticated By: Genevive Bi, M.D.    Dg Chest Port 1 View  07/19/2012  *RADIOLOGY REPORT*  Clinical Data: Aspiration pneumonia, intubated  PORTABLE CHEST - 1 VIEW  Comparison: Prior chest x-ray 07/18/2012  Findings: The endotracheal tube is stable in position 2 cm above the carina.  Right IJ vascular sheath, left subclavian central venous catheter and nasogastric tube are all in unchanged position.  Improving dense right lower lobe opacity and associated volume loss.  Stable cardiomegaly.  The remainder the lungs are relatively clear.  IMPRESSION: 1.  Improving right lower lobe opacity. 2.  Stable and satisfactory support apparatus   Original  Report Authenticated By: Malachy Moan, M.D.     Medications:  I have reviewed the patient's current medications. Scheduled: . antiseptic oral rinse  15 mL Mouth Rinse QID  . aspirin  81 mg Per Tube Daily  . chlorhexidine  15 mL Mouth Rinse BID  . enoxaparin (LOVENOX) injection  90 mg Subcutaneous Q12H  . heparin lock flush  500 Units Intravenous Once  . pantoprazole sodium  40 mg Per Tube Daily  . pyridostigmine  39.6 mg Oral Q4H  . sodium chloride  10-40 mL Intracatheter Q12H   ZOX:WRUEAV chloride, acetaminophen, calcium carbonate, calcium gluconate IVPB, calcium gluconate IVPB, calcium gluconate, diphenhydrAMINE, fentaNYL, midazolam, sodium chloride  Assessment/Plan: Myasthenia gravis exacerbation with severe bulbar weakness, slowly improving with plasmapheresis and Mestinon. Respiratory parameters are improving with NIF of -25 yesterday, up from -10 on 07/16/2012. Vital capacity is at 1800.  No changes in current management anticipated. Plasmapheresis we'll continue as planned. Hopefully, he can be extubated by the end of his treatment course of plasmapheresis, or sooner.   C.R. Roseanne Reno, MD [facility] [phone]  07/20/2012  9:29 AM

## 2012-07-20 NOTE — Progress Notes (Signed)
Attempted to place nasogastric tube, unsuccessful. Patient coughing continously and sats down low 80's. Patient has decreased gag reflex. .patient on 4 liters nasal cannula sats 90-92%.Marland Kitchen

## 2012-07-20 NOTE — Progress Notes (Signed)
SLP Cancellation Note  Patient Details Name: Victor Durham MRN: [id number] DOB: 09-13-1955   Cancelled treatment:  ST received order for BSE.  Evaluation deferred to 07/21/12 as patient medically unable to participate fully. Moreen Fowler M.S., CCC-SLP [phone]  [facility] 07/20/2012, 5:26 PM

## 2012-07-20 NOTE — Progress Notes (Signed)
Pt extubated per MD order and placed on 3L nasal cannula.  Pt tolerating well at this time.  RT will continue to monitor.

## 2012-07-21 ENCOUNTER — Inpatient Hospital Stay (HOSPITAL_COMMUNITY): Payer: Medicaid Other

## 2012-07-21 LAB — POCT I-STAT, CHEM 8
BUN: 18 mg/dL (ref 6–23)
BUN: 15 mg/dL (ref 6–23)
Chloride: 100 mEq/L (ref 96–112)
Chloride: 100 mEq/L (ref 96–112)
Creatinine, Ser: 0.8 mg/dL (ref 0.50–1.35)
Glucose, Bld: 110 mg/dL — ABNORMAL HIGH (ref 70–99)
Glucose, Bld: 92 mg/dL (ref 70–99)
HCT: 33 % — ABNORMAL LOW (ref 39.0–52.0)
Hemoglobin: 13.6 g/dL (ref 13.0–17.0)
Potassium: 4.3 mEq/L (ref 3.5–5.1)
Potassium: 4 mEq/L (ref 3.5–5.1)
Sodium: 139 mEq/L (ref 135–145)

## 2012-07-21 LAB — CBC
Hemoglobin: 13.1 g/dL (ref 13.0–17.0)
MCH: 29.8 pg (ref 26.0–34.0)
RBC: 4.39 MIL/uL (ref 4.22–5.81)
WBC: 7.8 10*3/uL (ref 4.0–10.5)

## 2012-07-21 LAB — BASIC METABOLIC PANEL
GFR calc Af Amer: >90 mL/min (ref >90)
GFR calc non Af Amer: >90 mL/min (ref >90)
Glucose, Bld: 94 mg/dL (ref 70–99)
Potassium: 4.1 mEq/L (ref 3.5–5.1)
Sodium: 139 mEq/L (ref 135–145)

## 2012-07-21 LAB — GLUCOSE, CAPILLARY
Glucose-Capillary: 76 mg/dL (ref 70–99)
Glucose-Capillary: 91 mg/dL (ref 70–99)
Glucose-Capillary: 99 mg/dL (ref 70–99)

## 2012-07-21 LAB — PROTIME-INR
INR: 1.33 (ref 0.00–1.49)
Prothrombin Time: 16.2 seconds — ABNORMAL HIGH (ref 11.6–15.2)

## 2012-07-21 LAB — POCT I-STAT 3, ART BLOOD GAS (G3+): O2 Saturation: 97 %

## 2012-07-21 LAB — APTT: aPTT: 38 seconds — ABNORMAL HIGH (ref 24–37)

## 2012-07-21 LAB — HEPARIN LEVEL (UNFRACTIONATED): Heparin Unfractionated: 0.45 IU/mL (ref 0.30–0.70)

## 2012-07-21 MED ORDER — ETOMIDATE 2 MG/ML IV SOLN
40.0000 mg | Freq: Once | INTRAVENOUS | Status: AC
Start: 2012-07-22 — End: 2012-07-22
  Administered 2012-07-22: 40 mg via INTRAVENOUS
  Filled 2012-07-21 (×3): qty 20

## 2012-07-21 MED ORDER — MIDAZOLAM HCL 2 MG/2ML IJ SOLN
INTRAMUSCULAR | Status: AC
  Administered 2012-07-21: 4 mg
  Filled 2012-07-21: qty 4

## 2012-07-21 MED ORDER — ALPRAZOLAM 0.25 MG PO TABS
0.5000 mg | ORAL_TABLET | ORAL | Status: DC | PRN
  Administered 2012-07-21 – 2012-08-03 (×4): 0.5 mg via ORAL
  Filled 2012-07-21 (×2): qty 2
  Filled 2012-07-21: qty 1
  Filled 2012-07-21: qty 2

## 2012-07-21 MED ORDER — PROMOTE PO LIQD: 1000.0000 mL | ORAL | Status: DC

## 2012-07-21 MED ORDER — HEPARIN (PORCINE) IN NACL 100-0.45 UNIT/ML-% IJ SOLN
1500.0000 [IU]/h | INTRAMUSCULAR | Status: AC
  Administered 2012-07-21: 1500 [IU]/h via INTRAVENOUS
  Filled 2012-07-21 (×2): qty 250

## 2012-07-21 MED ORDER — FENTANYL CITRATE 0.05 MG/ML IJ SOLN
200.0000 ug | Freq: Once | INTRAMUSCULAR | Status: AC
  Administered 2012-07-22: 200 ug via INTRAVENOUS
  Filled 2012-07-21: qty 4

## 2012-07-21 MED ORDER — FUROSEMIDE 10 MG/ML IJ SOLN
40.0000 mg | Freq: Once | INTRAMUSCULAR | Status: AC
  Administered 2012-07-21: 40 mg via INTRAVENOUS

## 2012-07-21 MED ORDER — VECURONIUM BROMIDE 10 MG IV SOLR: 10.0000 mg | Freq: Once | INTRAVENOUS | Status: DC

## 2012-07-21 MED ORDER — MIDAZOLAM HCL 2 MG/2ML IJ SOLN
4.0000 mg | Freq: Once | INTRAMUSCULAR | Status: AC
  Administered 2012-07-22: 4 mg via INTRAVENOUS
  Filled 2012-07-21: qty 4

## 2012-07-21 MED ORDER — PHENOL 1.4 % MT LIQD
1.0000 | OROMUCOSAL | Status: DC | PRN
  Filled 2012-07-21 (×2): qty 177

## 2012-07-21 MED ORDER — PROPOFOL 10 MG/ML IV EMUL
5.0000 ug/kg/min | INTRAVENOUS | Status: DC
  Administered 2012-07-21: 10.365 ug/kg/min via INTRAVENOUS
  Filled 2012-07-21: qty 100

## 2012-07-21 MED ORDER — JEVITY 1.2 CAL PO LIQD: 1000.0000 mL | ORAL | Status: DC

## 2012-07-21 MED ORDER — PROMOTE PO LIQD
1000.0000 mL | ORAL | Status: AC
  Filled 2012-07-21 (×2): qty 1000

## 2012-07-21 MED ORDER — PROPOFOL 10 MG/ML IV EMUL
INTRAVENOUS | Status: AC
  Administered 2012-07-21: 1000 mg
  Filled 2012-07-21: qty 100

## 2012-07-21 MED ORDER — ENOXAPARIN SODIUM 80 MG/0.8ML ~~LOC~~ SOLN
80.0000 mg | Freq: Two times a day (BID) | SUBCUTANEOUS | Status: DC
Start: 2012-07-21 — End: 2012-07-21
  Filled 2012-07-21 (×2): qty 0.8

## 2012-07-21 MED ORDER — PROPOFOL 10 MG/ML IV EMUL: 5.0000 ug/kg/min | Freq: Once | INTRAVENOUS | Status: DC

## 2012-07-21 MED ORDER — FUROSEMIDE 10 MG/ML IJ SOLN
INTRAMUSCULAR | Status: AC
  Administered 2012-07-21: 15:00:00
  Filled 2012-07-21: qty 4

## 2012-07-21 NOTE — Procedures (Signed)
Intubation Procedure Note CAGE GUPTON [id number] [birth year]  Procedure: Intubation Indications: Respiratory insufficiency  Procedure Details Consent: Emergent, about to code Time Out: Verified patient identification, verified procedure, site/side was marked, verified correct patient position, special equipment/implants available, medications/allergies/relevent history reviewed, required imaging and test results available.  Performed  Maximum sterile technique was used including gloves, gown and hand hygiene.  MAC and 3    Evaluation Hemodynamic Status: BP stable throughout; O2 sats: stable throughout Patient's Current Condition: stable Complications: No apparent complications Patient did tolerate procedure well. Chest X-ray ordered to verify placement.  CXR: pending.   Nelda Bucks 07/21/2012  Tolerated well  Mcarthur Rossetti. Tyson Alias, MD, FACP Pgr: [phone] [place] [facility]

## 2012-07-21 NOTE — Care Management Note (Signed)
    Page 1 of 2   07/21/2012     2:35:14 PM   CARE MANAGEMENT NOTE 07/21/2012  Patient:  Victor Durham, Victor Durham   Account Number:  [id number]  Date Initiated:  07/11/2012  Documentation initiated by:  DAVIS,RHONDA  Subjective/Objective Assessment:   cardiac arrest and cpr x33mins, intubated and on vent.     Action/Plan:   homeless   Anticipated DC Date:  07/17/2012   Anticipated DC Plan:  HOME/SELF CARE  In-house referral  Clinical Social Worker         Choice offered to / List presented to:             Status of service:  In process, will continue to follow Medicare Important Message given?   (If response is "NO", the following Medicare IM given date fields will be blank) Date Medicare IM given:   Date Additional Medicare IM given:    Discharge Disposition:    Per UR Regulation:  Reviewed for med. necessity/level of care/duration of stay  If discussed at Long Length of Stay Meetings, dates discussed:    Comments:  Contact:  Mckenna Boruff [name] [phone]; back up contact for father: [name]: [phone]  07-21-12 2:32pm Avie Arenas, RNBSN - [phone] ext and reintubated within 24 hours.  Considering Trach.  07-17-12 2:30pm Avie Arenas, RNBSN [phone] tx to [facility] on 2-5 - dx with MG - on plasmaphoresis - and vent.  Is homeless - ?? discharge plan at this time.  SW involved.  Will continue to follow for progression.  [id number] Earlene Plater, RN, BSN, CCM:  CHART REVIEWED AND UPDATED.  Next chart review due on [id number]. NO DISCHARGE NEEDS PRESENT AT THIS TIME. CASE MANAGEMENT [phone]   [id number] Earlene Plater, RN, BSN, CCM:  CHART REVIEWED AND UPDATED.  Next chart review due on [id number]. NO DISCHARGE NEEDS PRESENT AT THIS TIME. CASE MANAGEMENT [phone]

## 2012-07-21 NOTE — Progress Notes (Signed)
Clinical Social Worker phoned [employer]; [place], [place]. [phone]  CSW explained that this pt is stating that Mr. Ropas is his HCPOA.  Office states there is nothing on file for this pt.  CSW updated RNCM.  CSW to continue to follow and assist as needed.   [name], MSW, [place] [phone]

## 2012-07-21 NOTE — Progress Notes (Signed)
Subjective: Complaining of an achy feeling of jitteriness. Patient has a history of panic attacks and feels that this is a manifestation of the same. Is not short of breath. His been no recurrence of diplopia. Successfully extubated yesterday.  Objective: Current vital signs: BP 80/59  Pulse 85  Temp(Src) 96.6 F (35.9 C) (Oral)  Resp 22  Ht 6' (1.829 m)  Wt 80.4 kg (177 lb 4 oz)  BMI 24.03 kg/m2  SpO2 98%  Neurologic Exam: Alert and very anxious. Follows commands well. Extraocular movements are full and conjugate with no diplopia in any field of gaze. Frontalis, orbicularis oculi and the particulars hours strength 4/5. Mild dysarthria with low speech volume. Moderately severe neck flexor weakness. Normal strength of upper and lower extremities proximally and distally.  Lab Results: Results for orders placed during the hospital encounter of 07/10/12 (from the past 48 hour(s))  GLUCOSE, CAPILLARY     Status: Abnormal   Collection Time    07/19/12  7:56 PM      Result Value Range   Glucose-Capillary 118 (*) 70 - 99 mg/dL   Comment 1 Notify RN    GLUCOSE, CAPILLARY     Status: None   Collection Time    07/20/12 12:17 AM      Result Value Range   Glucose-Capillary 91  70 - 99 mg/dL  GLUCOSE, CAPILLARY     Status: None   Collection Time    07/20/12  4:14 AM      Result Value Range   Glucose-Capillary 90  70 - 99 mg/dL  BASIC METABOLIC PANEL     Status: Abnormal   Collection Time    07/20/12  4:28 AM      Result Value Range   Sodium 135  135 - 145 mEq/L   Potassium 3.8  3.5 - 5.1 mEq/L   Chloride 100  96 - 112 mEq/L   CO2 26  19 - 32 mEq/L   Glucose, Bld 103 (*) 70 - 99 mg/dL   BUN 16  6 - 23 mg/dL   Creatinine, Ser 1.47  0.50 - 1.35 mg/dL   Calcium 8.7  8.4 - 82.9 mg/dL   GFR calc non Af Amer >90  >90 mL/min   GFR calc Af Amer >90  >90 mL/min   Comment:            The eGFR has been calculated     using the CKD EPI equation.     This calculation has not been   validated in all clinical     situations.     eGFR's persistently     <90 mL/min signify     possible Chronic Kidney Disease.  CBC     Status: Abnormal   Collection Time    07/20/12  4:28 AM      Result Value Range   WBC 4.9  4.0 - 10.5 K/uL   RBC 3.88 (*) 4.22 - 5.81 MIL/uL   Hemoglobin 11.7 (*) 13.0 - 17.0 g/dL   HCT 56.2 (*) 13.0 - 86.5 %   MCV 89.2  78.0 - 100.0 fL   MCH 30.2  26.0 - 34.0 pg   MCHC 33.8  30.0 - 36.0 g/dL   RDW 78.4  69.6 - 29.5 %   Platelets 166  150 - 400 K/uL  GLUCOSE, CAPILLARY     Status: Abnormal   Collection Time    07/20/12  7:32 AM      Result Value Range  Glucose-Capillary 106 (*) 70 - 99 mg/dL  GLUCOSE, CAPILLARY     Status: None   Collection Time    07/20/12 11:50 AM      Result Value Range   Glucose-Capillary 97  70 - 99 mg/dL  GLUCOSE, CAPILLARY     Status: None   Collection Time    07/20/12  3:21 PM      Result Value Range   Glucose-Capillary 93  70 - 99 mg/dL  GLUCOSE, CAPILLARY     Status: None   Collection Time    07/20/12  7:54 PM      Result Value Range   Glucose-Capillary 97  70 - 99 mg/dL  POCT I-STAT, CHEM 8     Status: None   Collection Time    07/20/12 11:05 PM      Result Value Range   Sodium 139  135 - 145 mEq/L   Potassium 4.0  3.5 - 5.1 mEq/L   Chloride 100  96 - 112 mEq/L   BUN 15  6 - 23 mg/dL   Creatinine, Ser 0.98  0.50 - 1.35 mg/dL   Glucose, Bld 92  70 - 99 mg/dL   Calcium, Ion 1.19  1.47 - 1.23 mmol/L   TCO2 30  0 - 100 mmol/L   Hemoglobin 13.6  13.0 - 17.0 g/dL   HCT 82.9  56.2 - 13.0 %  GLUCOSE, CAPILLARY     Status: None   Collection Time    07/21/12 12:23 AM      Result Value Range   Glucose-Capillary 91  70 - 99 mg/dL  GLUCOSE, CAPILLARY     Status: None   Collection Time    07/21/12  4:23 AM      Result Value Range   Glucose-Capillary 95  70 - 99 mg/dL  BASIC METABOLIC PANEL     Status: None   Collection Time    07/21/12  4:30 AM      Result Value Range   Sodium 139  135 - 145 mEq/L    Potassium 4.1  3.5 - 5.1 mEq/L   Chloride 104  96 - 112 mEq/L   CO2 27  19 - 32 mEq/L   Glucose, Bld 94  70 - 99 mg/dL   BUN 16  6 - 23 mg/dL   Creatinine, Ser 8.65  0.50 - 1.35 mg/dL   Calcium 9.2  8.4 - 78.4 mg/dL   GFR calc non Af Amer >90  >90 mL/min   GFR calc Af Amer >90  >90 mL/min   Comment:            The eGFR has been calculated     using the CKD EPI equation.     This calculation has not been     validated in all clinical     situations.     eGFR's persistently     <90 mL/min signify     possible Chronic Kidney Disease.  CBC     Status: None   Collection Time    07/21/12  4:30 AM      Result Value Range   WBC 7.8  4.0 - 10.5 K/uL   RBC 4.39  4.22 - 5.81 MIL/uL   Hemoglobin 13.1  13.0 - 17.0 g/dL   HCT 69.6  29.5 - 28.4 %   MCV 90.4  78.0 - 100.0 fL   MCH 29.8  26.0 - 34.0 pg   MCHC 33.0  30.0 - 36.0 g/dL  RDW 14.3  11.5 - 15.5 %   Platelets 177  150 - 400 K/uL  GLUCOSE, CAPILLARY     Status: Abnormal   Collection Time    07/21/12  8:37 AM      Result Value Range   Glucose-Capillary 164 (*) 70 - 99 mg/dL    Studies/Results: Dg Chest Port 1 View  07/21/2012  *RADIOLOGY REPORT*  Clinical Data: Shortness of breath and pneumonia.  PORTABLE CHEST - 1 VIEW  Comparison: Multiple recent previous exams.  Findings: 0522 hours.  Endotracheal tube has been removed in the interval. The NG tube passes into the stomach although the distal tip position is not included on the film.  Left subclavian central line remains in place with tip overlying the innominate vein confluence.  Right IJ central line tip projects over the mid SVC. Persistent volume loss noted in the right hemithorax with elevation of the right hemidiaphragm and right base collapse / consolidation. Telemetry leads overlie the chest.  IMPRESSION: Slight increase and right base collapse / consolidation.  Status post extubation.   Original Report Authenticated By: Kennith Center, M.D.    Dg Chest Port 1 View  07/20/2012   *RADIOLOGY REPORT*  Clinical Data: Pneumonia  PORTABLE CHEST - 1 VIEW  Comparison: Chest radiograph 02/08/ 2014  Findings: Endotracheal tube, NG tube, and right central venous line unchanged.  Stable cardiac silhouette. Left subclavian line also unchanged.  There is a moderate right effusion not changed from prior.  Upper lungs are clear.  No pneumothorax.  IMPRESSION:  1.  Stable support apparatus. 2.  No interval change.  3.  Right lower lobe atelectasis and effusion.   Original Report Authenticated By: Genevive Bi, M.D.     Medications:  I have reviewed the patient's current medications. Scheduled: . albuterol  2.5 mg Nebulization Q8H  . antiseptic oral rinse  15 mL Mouth Rinse QID  . aspirin  81 mg Oral Daily  . chlorhexidine  15 mL Mouth Rinse BID  . enoxaparin (LOVENOX) injection  80 mg Subcutaneous Q12H  . heparin  1,000 Units Intracatheter Once  . heparin  1,000 Units Intracatheter Once  . heparin  1,000 Units Intracatheter Once  . heparin lock flush  500 Units Intravenous Once  . pantoprazole (PROTONIX) IV  40 mg Intravenous Q24H  . pyridostigmine  39.6 mg Oral Q4H  . sodium chloride  10-40 mL Intracatheter Q12H   ZOX:WRUEAV chloride, acetaminophen, ALPRAZolam, calcium carbonate, calcium gluconate IVPB, calcium gluconate IVPB, calcium gluconate, diphenhydrAMINE, fentaNYL, sodium chloride  Assessment/Plan: Myasthenia gravis with bulbar weakness, continually improving since plasmapheresis was started. Patient has received 3 of 5 planned courses of plasma exchange. His next. treatment is due tomorrow.  Patient is experiencing a recurrent panic attack. Xanax has been recommended.  We'll continue course of plasma exchange as planned, as well as continue Mestinon at current dose.  C.R. Roseanne Reno, MD [facility] [phone]  07/21/2012  10:36 AM

## 2012-07-21 NOTE — Progress Notes (Signed)
ANTICOAGULATION CONSULT NOTE - Follow Up Consult  Pharmacy Consult:  Lovenox -> Heparin Indication: DVT  No Known Allergies  Patient Measurements: Height: 6' (182.9 cm) Weight: 177 lb 4 oz (80.4 kg) IBW/kg (Calculated) : 77.6  Vital Signs: Temp: 98 F (36.7 C) (02/10 1238) Temp src: Oral (02/10 1238) BP: 80/59 mmHg (02/10 1000) Pulse Rate: 67 (02/10 1144)  Labs:  Recent Labs  07/19/12 0500 07/20/12 0428 07/20/12 2305 07/21/12 0430  HGB 11.0* 11.7* 13.6 13.1  HCT 32.6* 34.6* 40.0 39.7  PLT 138* 166  --  177  CREATININE 0.51 0.53 0.80 0.57    Estimated Creatinine Clearance: 113.2 ml/min (by C-G formula based on Cr of 0.57).  Assessment: Pharmacy has been managing Lovenox for LLE DVT. Last dose this a.m. 0515  Patient's renal function has been stable. Plan for trach in a.m. so MD order to discontinue lovenox and start heparin gtt. Heparin gtt to be held tomorrow at 0400 for trach placement. (pt with therapeutic heparin level on 1500 units/hr earlier this admit).  Goal of Therapy: Heparin level 0.3-0.7  Plan: - D/C Lovenox - Start heparin 1500 ml/hr now (>8 hrs past last lovenox) - Heparin level in 8 hours - Noted plan to hold heparin at 0400 in a.m. so will not order   daily heparin level at this time  Christoper Fabian, PharmD, BCPS Clinical pharmacist, pager [phone] 07/21/2012, 1:56 PM

## 2012-07-21 NOTE — Progress Notes (Signed)
SLP Cancellation Note  Patient Details Name: Victor Durham MRN: [id number] DOB: 08/14/1955   Cancelled treatment:       Reason Eval/Treat Not Completed: Medical issues which prohibited therapy. Attempted to visit pt for swallow eval. Pt standing up at side of bed, gasping for air. Aided RN to get pt back to bed, pt in respiratory distress. Appears he will be reintubated. SLP will follow via chart for readiness given dx of MG and need for swallow eval at some point in the future.   Harlon Ditty, MA CCC-SLP [phone]  Claudine Mouton 07/21/2012, 8:51 AM

## 2012-07-21 NOTE — Progress Notes (Signed)
NUTRITION CONSULT/FOLLOW UP  Intervention:    Initiate Promote formula at 15 ml/hr and increase by 10 ml every 4 hours to goal rate of 75 ml/hr to provide 1800 kcals, 112 gm protein, 1510 ml of free water RD to follow for nutrition care plan  Nutrition Dx:   Inadequate oral intake related to inability to eat as evidenced by NPO status, ongoing  Goal:   EN to meet >/= 90% of estimated nutrition needs, currently unmet  Monitor:   EN regimen & tolerance, respiratory status, weight, labs, I/O's  Assessment:   Patient transferred from [facility] with diagnosis of myasthenias gravis.  Plasmapheresis ongoing ---> next treatment 2/11.  Extubated 2/9.  Prior to extubation was receiving EN support with Promote formula.  Speech Path following.  Patient re-intubated and currently on ventilator support ---> OGT in place MV: 10.3 Temp: 36.7 Propofol: 2.4 ml/hr ---> 63 fat kcals  RD consulted for EN initiation & management.  Height: Ht Readings from Last 1 Encounters:  07/11/12 6' (1.829 m)    Weight Status:   Wt Readings from Last 1 Encounters:  07/21/12 177 lb 4 oz (80.4 kg)    Re-estimated needs:  Kcal: 1800-1900 Protein: 110-120 gm Fluid: 1.8-1.9 L  Skin: Intact  Diet Order: NPO   Intake/Output Summary (Last 24 hours) at 07/21/12 1504 Last data filed at 07/21/12 1400  Gross per 24 hour  Intake    549 ml  Output   1222 ml  Net   -673 ml    Last BM: 2/10  Labs:   Recent Labs Lab 07/15/12 0346  07/17/12 0414  07/19/12 0500 07/20/12 0428 07/20/12 2305 07/21/12 0430  NA 134*  < > 134*  < > 135 135 139 139  K 3.5  < > 4.0  < > 3.8 3.8 4.0 4.1  CL 104  < > 104  < > 102 100 100 104  CO2 23  < > 25  < > 26 26  --  27  BUN 14  < > 12  < > 19 16 15 16   CREATININE 0.52  < > 0.49*  < > 0.51 0.53 0.80 0.57  CALCIUM 7.8*  < > 8.0*  < > 8.6 8.7  --  9.2  MG 2.0  --  2.0  --   --   --   --   --   PHOS 1.9*  --  2.7  --   --   --   --   --   GLUCOSE 124*  < > 108*  < > 108*  103* 92 94  < > = values in this interval not displayed.  CBG (last 3)   Recent Labs  07/21/12 0423 07/21/12 0837 07/21/12 1223  GLUCAP 95 164* 101*    Scheduled Meds: . albuterol  2.5 mg Nebulization Q8H  . antiseptic oral rinse  15 mL Mouth Rinse QID  . aspirin  81 mg Oral Daily  . chlorhexidine  15 mL Mouth Rinse BID  . [START ON 07/22/2012] etomidate  40 mg Intravenous Once  . fentaNYL  200 mcg Intravenous Once  . furosemide      . heparin  1,000 Units Intracatheter Once  . heparin  1,000 Units Intracatheter Once  . heparin lock flush  500 Units Intravenous Once  . midazolam  4 mg Intravenous Once  . pantoprazole (PROTONIX) IV  40 mg Intravenous Q24H  . pyridostigmine  39.6 mg Oral Q4H  . sodium chloride  10-40  mL Intracatheter Q12H    Continuous Infusions: . citrate dextrose 500 mL (07/17/12 0041)  . feeding supplement (PROMOTE)    . heparin    . propofol 5 mcg/kg/min (07/21/12 1209)    Maureen Chatters, RD, LDN Pager #: [phone] After-Hours Pager #: [phone]

## 2012-07-21 NOTE — Progress Notes (Signed)
Found pt standing at foot of bed, NG out, O2 off, in resp distress, placed back in bed, resp status quickly declined, pt bagged, MD called, RT called, MD to unit and pt intubated

## 2012-07-21 NOTE — Progress Notes (Signed)
PULMONARY  / CRITICAL CARE MEDICINE  Name: Victor Durham MRN: [id number] DOB: [birth year]    ADMISSION DATE:  07/10/2012 CONSULTATION DATE:  1/30/20143  REFERRING MD :  Juleen China  CHIEF COMPLAINT:  Dysphagia  BRIEF PATIENT DESCRIPTION: 57 y/o homeless male presented to the [facility] ED On 1/30 for the third time in two weeks for evaluation of dysphagia.  He complained of some difficulty breathing and underwent a neck CT.  Shortly after that procedure he developed respiratory and cardiac arrest.  PCCM called for admission.  SIGNIFICANT EVENTS / STUDIES:  1/30 respiratory and cardiac arrest in ED 1/30 CT head: hypoattenuation R midbrain extending into L pons, poorly characterized due to streak artifact 130 Echocardiogram: LVEF normal. Mild to moderate RV dilatation 1/31 EGD gastritis and esophagitis but no mass or bleeding source 1/31 LE venous duplex scans: LLE DVT 1/31 Thick secretions -plugged ETT during MRI 1/31 MRI -Scattered subcortical T2 hyperintensities -nonspecific, chronic microvascular ischemia vs a demyelinating process  2/1 Neurology consult: concern for myasthenia gravis 2/1 CT chest - no thymoma or central PE, Bilateral lower lobe consolidation and atelectasis , Nonspecific enhancing solid 4.1 cm mass exophytic from segment 2 of the left hepatic lobe  2/2 Mestinon started - intolerant due to excessive secretions 2/3 IVIG initiated by Neuro 2/4 Acetylcholine Receptor Ab 127.00 (<=0.30 mmol/L) 2/4 MRI cervical spine >> normal 2/4 Mestinon retried at a decreased dose 2-4 tx to cone. 2-7 Bronchoscopy 2/9- extubated 2/10 re intubated  LINES / TUBES: 1/30 ETT >> 2/9 1/30 L [place] CVL >> 2/5  RtI J HD Cath insertion>>   CULTURES: 1/30 blood >> NEG 1/30 urine >> NEG 2-2 sputum>> NEG 2-1 bc x 2 >> NEG 2-7 BAL >> oral flora  ANTIBIOTICS: unasyn 1/30 >> 2/4 levaquin 1/30 >> 2/4  SUBJECTIVE:  Extubated 2/9; on BiPAP overnight.  Very anxious this morning.  Describing thick  secretions that he sometimes gags on.  Does not feel like he needs to go back on BiPAP.  No pain. retubed 2/10  VITAL SIGNS: Temp:  [97.3 F (36.3 C)-98.7 F (37.1 C)] 97.3 F (36.3 C) (02/10 0400) Pulse Rate:  [64-95] 95 (02/10 0700) Resp:  [18-33] 33 (02/10 0700) BP: (96-144)/(50-112) 144/112 mmHg (02/10 0700) SpO2:  [88 %-100 %] 97 % (02/10 0700) FiO2 (%):  [39.7 %-40.2 %] 40 % (02/10 0335) Weight:  [177 lb 4 oz (80.4 kg)] 177 lb 4 oz (80.4 kg) (02/10 0500) VENTILATOR SETTINGS: Vent Mode:  [-] CPAP FiO2 (%):  [39.7 %-40.2 %] 40 % PEEP:  [0 cmH20] 0 cmH20 INTAKE / OUTPUT: Intake/Output     02/09 0701 - 02/10 0700 02/10 0701 - 02/11 0700   I.V. (mL/kg) 460 (5.7)    NG/GT 190    Total Intake(mL/kg) 650 (8.1)    Urine (mL/kg/hr) 2385 (1.2)    Total Output 2385     Net -1735            PHYSICAL EXAMINATION: Gen: this am severe distress; unresponsive HEENT: NG in place, slightly dry lips PULM: ronchi severe diffuse CV: s1s2 regular, no murmur AB: Soft, non tender Ext: 1+ symmetric non-pitting edema Neuro: moves extremities, follows commands  LABS:  Recent Labs Lab 07/15/12 0346  07/17/12 0414  07/19/12 0500 07/20/12 0428 07/21/12 0430  HGB 11.6*  < > 10.8*  < > 11.0* 11.7* 13.1  WBC 4.5  < > 4.0  < > 5.2 4.9 7.8  PLT 136*  < > 127*  < > 138*  166 177  NA 134*  < > 134*  < > 135 135 139  K 3.5  < > 4.0  < > 3.8 3.8 4.1  CL 104  < > 104  < > 102 100 104  CO2 23  < > 25  < > 26 26 27   GLUCOSE 124*  < > 108*  < > 108* 103* 94  BUN 14  < > 12  < > 19 16 16   CREATININE 0.52  < > 0.49*  < > 0.51 0.53 0.57  CALCIUM 7.8*  < > 8.0*  < > 8.6 8.7 9.2  MG 2.0  --  2.0  --   --   --   --   PHOS 1.9*  --  2.7  --   --   --   --   < > = values in this interval not displayed.  Recent Labs Lab 07/20/12 1150 07/20/12 1521 07/20/12 1954 07/21/12 0023 07/21/12 0423  GLUCAP 97 93 97 91 95    CXR: Dg Chest Port 1 View  07/20/2012  *RADIOLOGY REPORT*  Clinical Data:  Pneumonia  PORTABLE CHEST - 1 VIEW  Comparison: Chest radiograph 02/08/ 2014  Findings: Endotracheal tube, NG tube, and right central venous line unchanged.  Stable cardiac silhouette. Left subclavian line also unchanged.  There is a moderate right effusion not changed from prior.  Upper lungs are clear.  No pneumothorax.  IMPRESSION:  1.  Stable support apparatus. 2.  No interval change.  3.  Right lower lobe atelectasis and effusion.   Original Report Authenticated By: Genevive Bi, M.D.     ASSESSMENT / PLAN:  PULMONARY A: Acute respiratory failure due to Myasthenia Gravis, and Aspiration PNA, dramatic reintubated required 210 P:   Extubated 2/9, retubed, TV to 8 cc/kg, rate 18 Abg, stat pcxr F/u CXR in am Will discuss trach with family  Assess for collapse now, may  Need repat bronch  CARDIOVASCULAR A: LLE DVT P:  Lovenox transition to heparin for trach ASA 81mg  tele  RENAL A:  Volume overload. P:   Negative fluid balance as tolerated >> lasix repeat dose F/u renal fx F/u and replace electrolytes as needed  GASTROINTESTINAL A:  Dysphagia due to MG. Nutrition. P:   Restart tf , npo midnight ppi  HEMATOLOGIC A: Anemia of critical illness. Thrombocytopenia >> resolved P:  coags now for trach  INFECTIOUS A:  Aspiration PNA. Completed Abx 2/04. P:   Monitor clinically pcxr now  ENDOCRINE A:  Hyperglycemia. P:   Monitor blood sugar on BMET  NEUROLOGIC A: Myasthenia Gravis Anxiety P:   S/p IVIG mestinon  plasma exchange per neurology, likely needs 2 more doses Reintubated, propofol addition Avoid paralysis with MG  Family contact:  Sayed Giddens [name] [phone]; back up contact for father: [name]: [phone]  Resolved problems >> cardiac arrest 2nd to respiratory failure Call family trach  BOOTH, ERIN Family Medicine, PGY-2 07/21/2012, 7:41 AM  I have fully examined this patient and agree with above findings.    And edited in  full  Ccm time 35 min  Mcarthur Rossetti. Tyson Alias, MD, FACP Pgr: [phone] [facility]

## 2012-07-22 ENCOUNTER — Inpatient Hospital Stay (HOSPITAL_COMMUNITY): Payer: Medicaid Other

## 2012-07-22 ENCOUNTER — Encounter (HOSPITAL_COMMUNITY): Payer: Self-pay | Admitting: Radiology

## 2012-07-22 LAB — GLUCOSE, CAPILLARY
Glucose-Capillary: 102 mg/dL — ABNORMAL HIGH (ref 70–99)
Glucose-Capillary: 95 mg/dL (ref 70–99)
Glucose-Capillary: 88 mg/dL (ref 70–99)
Glucose-Capillary: 100 mg/dL — ABNORMAL HIGH (ref 70–99)

## 2012-07-22 LAB — BASIC METABOLIC PANEL
CO2: 26 mEq/L (ref 19–32)
Calcium: 9.1 mg/dL (ref 8.4–10.5)
Chloride: 100 mEq/L (ref 96–112)
Creatinine, Ser: 0.68 mg/dL (ref 0.50–1.35)
GFR calc Af Amer: >90 mL/min (ref >90)
Sodium: 138 mEq/L (ref 135–145)

## 2012-07-22 LAB — CBC WITH DIFFERENTIAL/PLATELET
Basophils Absolute: 0.1 10*3/uL (ref 0.0–0.1)
Eosinophils Relative: 2 % (ref 0–5)
Lymphocytes Relative: 22 % (ref 12–46)
Lymphs Abs: 1.5 10*3/uL (ref 0.7–4.0)
MCV: 90.1 fL (ref 78.0–100.0)
Neutro Abs: 4.7 10*3/uL (ref 1.7–7.7)
Platelets: 217 10*3/uL (ref 150–400)
RBC: 4.24 MIL/uL (ref 4.22–5.81)
RDW: 14.7 % (ref 11.5–15.5)
WBC: 7.1 10*3/uL (ref 4.0–10.5)

## 2012-07-22 LAB — HEPARIN LEVEL (UNFRACTIONATED): Heparin Unfractionated: 0.18 IU/mL — ABNORMAL LOW (ref 0.30–0.70)

## 2012-07-22 MED ORDER — DIPHENHYDRAMINE HCL 25 MG PO CAPS: 25.0000 mg | ORAL_CAPSULE | Freq: Four times a day (QID) | ORAL | Status: DC | PRN

## 2012-07-22 MED ORDER — HEPARIN (PORCINE) IN NACL 100-0.45 UNIT/ML-% IJ SOLN
1750.0000 [IU]/h | INTRAMUSCULAR | Status: AC
  Administered 2012-07-22: 1500 [IU]/h via INTRAVENOUS
  Filled 2012-07-22: qty 250

## 2012-07-22 MED ORDER — SODIUM CHLORIDE 0.9 % IV SOLN: 4.0000 g | INTRAVENOUS | Status: DC | PRN

## 2012-07-22 MED ORDER — ACD FORMULA A 0.73-2.45-2.2 GM/100ML VI SOLN
500.0000 mL | Status: DC
  Administered 2012-07-22: 500 mL via INTRAVENOUS
  Filled 2012-07-22 (×3): qty 500

## 2012-07-22 MED ORDER — HEPARIN (PORCINE) IN NACL 100-0.45 UNIT/ML-% IJ SOLN
1500.0000 [IU]/h | INTRAMUSCULAR | Status: DC
  Filled 2012-07-22: qty 250

## 2012-07-22 MED ORDER — SODIUM CHLORIDE 0.9 % IV SOLN: INTRAVENOUS | Status: DC

## 2012-07-22 MED ORDER — SODIUM CHLORIDE 0.9 % IV SOLN
INTRAVENOUS | Status: AC
  Administered 2012-07-22 (×4): via INTRAVENOUS_CENTRAL
  Filled 2012-07-22 (×4): qty 200

## 2012-07-22 MED ORDER — CALCIUM CARBONATE ANTACID 500 MG PO CHEW: 2.0000 | CHEWABLE_TABLET | ORAL | Status: DC | PRN

## 2012-07-22 MED ORDER — SODIUM CHLORIDE 0.9 % IV SOLN: 2.0000 g | INTRAVENOUS | Status: DC | PRN

## 2012-07-22 MED ORDER — SODIUM CHLORIDE 0.9 % IV SOLN
INTRAVENOUS | Status: DC
  Administered 2012-07-22: 08:00:00 via INTRAVENOUS
  Administered 2012-07-22: 1000 mL via INTRAVENOUS
  Administered 2012-07-25: 20 mL/h via INTRAVENOUS
  Administered 2012-07-28 – 2012-07-31 (×3): via INTRAVENOUS
  Administered 2012-08-04: 500 mL via INTRAVENOUS
  Administered 2012-08-06: 04:00:00 via INTRAVENOUS

## 2012-07-22 MED ORDER — CALCIUM GLUCONATE 10 % IV SOLN: 2.0000 g | INTRAVENOUS | Status: DC | PRN

## 2012-07-22 MED ORDER — SODIUM CHLORIDE 0.9 % IV SOLN
INTRAVENOUS | Status: DC
  Filled 2012-07-22 (×9): qty 200

## 2012-07-22 MED ORDER — HEPARIN SODIUM (PORCINE) 1000 UNIT/ML IJ SOLN
1000.0000 [IU] | Freq: Once | INTRAMUSCULAR | Status: DC
Start: 2012-07-22 — End: 2012-07-28

## 2012-07-22 MED ORDER — CEFAZOLIN SODIUM 1-5 GM-% IV SOLN
1.0000 g | INTRAVENOUS | Status: AC
  Administered 2012-07-23: 1 g via INTRAVENOUS
  Filled 2012-07-22: qty 50

## 2012-07-22 MED ORDER — FUROSEMIDE 10 MG/ML IJ SOLN
20.0000 mg | Freq: Once | INTRAMUSCULAR | Status: AC
  Administered 2012-07-22: 20 mg via INTRAVENOUS
  Filled 2012-07-22: qty 2

## 2012-07-22 MED ORDER — ACETAMINOPHEN 325 MG PO TABS: 650.0000 mg | ORAL_TABLET | ORAL | Status: DC | PRN

## 2012-07-22 MED ORDER — FENTANYL CITRATE 0.05 MG/ML IJ SOLN: 25.0000 ug | INTRAMUSCULAR | Status: AC | PRN

## 2012-07-22 NOTE — Progress Notes (Signed)
Clinical Social Worker met with pt briefly at bedside; pt appears tired.  CSW began conversation related to the potential need for ST-SNF at dc to assist with continued trach care and improved mobility and independence.  CSW to continue to follow and assist as needed.   [name], MSW, [place] [phone]

## 2012-07-22 NOTE — Procedures (Signed)
Perc trach See full dictation Blood loss less 1 cc 8 shiley placed  Mcarthur Rossetti. Tyson Alias, MD, FACP Pgr: [phone] [facility]

## 2012-07-22 NOTE — Progress Notes (Signed)
Clinical Child psychotherapist spoke with Clinical research associate.  Per Artist pt is still married to Victor Durham: [phone].  CSW spoke with Victor Durham via telephone.  CSW introduced self and explained role.  CSW provided active listening.  At this time, wife states she is deferring decision making to pt's father, Victor Durham, [name] As she actively pursuing a divorce from pt.  CSW thanked wife for information.  CSW updated Artist.  CSW to continue to follow and assist as needed.   [name], MSW, [place] [phone]

## 2012-07-22 NOTE — Progress Notes (Signed)
ANTICOAGULATION CONSULT NOTE - Follow Up Consult  Pharmacy Consult:  Heparin Indication: DVT  No Known Allergies  Patient Measurements: Height: 6' (182.9 cm) Weight: 177 lb 4 oz (80.4 kg) IBW/kg (Calculated) : 77.6  Vital Signs: Temp: 97.8 F (36.6 C) (02/10 2337) Temp src: Oral (02/10 2337) BP: 97/55 mmHg (02/11 0000) Pulse Rate: 74 (02/11 0000)  Labs:  Recent Labs  07/19/12 0500 07/20/12 0428 07/20/12 2305 07/21/12 0430 07/21/12 1400 07/21/12 2359  HGB 11.0* 11.7* 13.6 13.1  --   --   HCT 32.6* 34.6* 40.0 39.7  --   --   PLT 138* 166  --  177  --   --   APTT  --   --   --   --  38*  --   LABPROT  --   --   --   --  16.2*  --   INR  --   --   --   --  1.33  --   HEPARINUNFRC  --   --   --   --  0.45 0.46  CREATININE 0.51 0.53 0.80 0.57  --   --     Estimated Creatinine Clearance: 113.2 ml/min (by C-G formula based on Cr of 0.57).  Assessment: 57 yo male with LLE DVT for heparin Goal of Therapy: Heparin level 0.3-0.7  Plan: Continue Heparin at current rate F/U after tracheostomy in AM  Geannie Risen, PharmD, BCPS  07/22/2012, 12:28 AM

## 2012-07-22 NOTE — Progress Notes (Signed)
ANTICOAGULATION CONSULT NOTE - Follow Up Consult  Pharmacy Consult:  Heparin Indication: DVT  No Known Allergies  Patient Measurements: Height: 6' (182.9 cm) Weight: 175 lb 0.7 oz (79.4 kg) IBW/kg (Calculated) : 77.6  Vital Signs: Temp: 97.4 F (36.3 C) (02/11 0747) Temp src: Oral (02/11 0747) BP: 74/49 mmHg (02/11 0920) Pulse Rate: 59 (02/11 0920)  Labs:  Recent Labs  07/20/12 0428 07/20/12 2305 07/21/12 0430 07/21/12 1400 07/21/12 2359 07/22/12 0634  HGB 11.7* 13.6 13.1  --   --  12.8*  HCT 34.6* 40.0 39.7  --   --  38.2*  PLT 166  --  177  --   --  217  APTT  --   --   --  38*  --   --   LABPROT  --   --   --  16.2*  --   --   INR  --   --   --  1.33  --   --   HEPARINUNFRC  --   --   --  0.45 0.46  --   CREATININE 0.53 0.80 0.57  --   --  0.68    Estimated Creatinine Clearance: 113.2 ml/min (by C-G formula based on Cr of 0.68).  Assessment: 57 yo male with LLE DVT for heparin. S/p trach this a.m. Heparin on hold since 0400 this a.m. Per MD, ok to restart at 1330 (4hr past trach placed)  Goal of Therapy: Heparin level 0.3-0.7 Monitor plts per protocol  Plan: 1) Restart heparin at 1500 units/hr at 1330 today 2) Check 6 hour heparin level past restart 3) Daily heparin level and CBC  Christoper Fabian, PharmD, BCPS Clinical pharmacist, pager [phone] 07/22/2012, 10:53 AM

## 2012-07-22 NOTE — Progress Notes (Signed)
Video bronchoscopy procedure performed. Bronchial washing intervention performed. 

## 2012-07-22 NOTE — Progress Notes (Signed)
ANTICOAGULATION CONSULT NOTE - Follow Up Consult  Pharmacy Consult:  Heparin Indication: DVT  No Known Allergies  Patient Measurements: Height: 6' (182.9 cm) Weight: 175 lb 0.7 oz (79.4 kg) IBW/kg (Calculated) : 77.6  Vital Signs: Temp: 98.2 F (36.8 C) (02/11 1600) Temp src: Oral (02/11 1600) BP: 90/58 mmHg (02/11 1900) Pulse Rate: 68 (02/11 1900)  Labs:  Recent Labs  07/20/12 0428 07/20/12 2305 07/21/12 0430 07/21/12 1400 07/21/12 2359 07/22/12 0634 07/22/12 1945  HGB 11.7* 13.6 13.1  --   --  12.8*  --   HCT 34.6* 40.0 39.7  --   --  38.2*  --   PLT 166  --  177  --   --  217  --   APTT  --   --   --  38*  --   --   --   LABPROT  --   --   --  16.2*  --   --   --   INR  --   --   --  1.33  --   --   --   HEPARINUNFRC  --   --   --  0.45 0.46  --  0.18*  CREATININE 0.53 0.80 0.57  --   --  0.68  --     Estimated Creatinine Clearance: 113.2 ml/min (by C-G formula based on Cr of 0.68).  Assessment: 57 yo male with LLE DVT for heparin. S/p trach this a.m. Heparin on hold since 0400 this a.m. Heparin gtt resumed, first xa level subtherapeutic, will increase rate. Heparin will be off at 0330.  Goal of Therapy: Heparin level 0.3-0.7 Monitor plts per protocol  Plan: Increase gtt to 1750/hr and f/u after peg placement.  Verlene Mayer, PharmD, BCPS Pager [phone] 07/22/2012, 8:29 PM

## 2012-07-22 NOTE — Progress Notes (Signed)
PULMONARY  / CRITICAL CARE MEDICINE  Name: Victor [place] MRN: [id number] DOB: 21-Jan-1956    ADMISSION DATE:  07/10/2012 CONSULTATION DATE:  1/30/20143  REFERRING MD :  Juleen China  CHIEF COMPLAINT:  Dysphagia  BRIEF PATIENT DESCRIPTION: 57 y/o homeless male presented to the [facility] ED On 1/30 for the third time in two weeks for evaluation of dysphagia.  He complained of some difficulty breathing and underwent a neck CT.  Shortly after that procedure he developed respiratory and cardiac arrest.  PCCM called for admission.  SIGNIFICANT EVENTS / STUDIES:  1/30 respiratory and cardiac arrest in ED 1/30 CT head: hypoattenuation R midbrain extending into L pons, poorly characterized due to streak artifact 130 Echocardiogram: LVEF normal. Mild to moderate RV dilatation 1/31 EGD gastritis and esophagitis but no mass or bleeding source 1/31 LE venous duplex scans: LLE DVT 1/31 Thick secretions -plugged ETT during MRI 1/31 MRI -Scattered subcortical T2 hyperintensities -nonspecific, chronic microvascular ischemia vs a demyelinating process  2/1 Neurology consult: concern for myasthenia gravis 2/1 CT chest - no thymoma or central PE, Bilateral lower lobe consolidation and atelectasis , Nonspecific enhancing solid 4.1 cm mass exophytic from segment 2 of the left hepatic lobe  2/2 Mestinon started - intolerant due to excessive secretions 2/3 IVIG initiated by Neuro 2/4 Acetylcholine Receptor Ab 127.00 (<=0.30 mmol/L) 2/4 MRI cervical spine >> normal 2/4 Mestinon retried at a decreased dose 2-4 tx to cone. 2-7 Bronchoscopy 2/9- extubated 2/10 re intubated  LINES / TUBES: 1/30 ETT >> 2/9 1/30 L [place] CVL >> 2/5  RtI J HD Cath insertion>>   CULTURES: 1/30 blood >> NEG 1/30 urine >> NEG 2-2 sputum>> NEG 2-1 bc x 2 >> NEG 2-7 BAL >> oral flora  ANTIBIOTICS: unasyn 1/30 >> 2/4 levaquin 1/30 >> 2/4  SUBJECTIVE:  Re-intubated 2/10.  Alert this morning.  Feeling a little better.  VITAL  SIGNS: Temp:  [96.6 F (35.9 C)-98.1 F (36.7 C)] 98.1 F (36.7 C) (02/11 0434) Pulse Rate:  [60-116] 64 (02/11 0500) Resp:  [7-33] 18 (02/11 0500) BP: (73-186)/(43-112) 86/53 mmHg (02/11 0500) SpO2:  [91 %-100 %] 96 % (02/11 0500) FiO2 (%):  [39.7 %-100 %] 40 % (02/11 0500) Weight:  [175 lb 0.7 oz (79.4 kg)] 175 lb 0.7 oz (79.4 kg) (02/11 0436) VENTILATOR SETTINGS: Vent Mode:  [-] PRVC FiO2 (%):  [39.7 %-100 %] 40 % Set Rate:  [18 bmp] 18 bmp Vt Set:  [550 mL] 550 mL PEEP:  [5 cmH20] 5 cmH20 Plateau Pressure:  [14 cmH20-21 cmH20] 15 cmH20 INTAKE / OUTPUT: Intake/Output     02/10 0701 - 02/11 0700   I.V. (mL/kg) 834 (10.5)   Total Intake(mL/kg) 834 (10.5)   Urine (mL/kg/hr) 1997 (1)   Total Output 1997   Net -1163       Stool Occurrence 1 x     PHYSICAL EXAMINATION: Gen: sedated, but easily wakes to voice HEENT: NG in place, ETT in place PULM: coarse breath sounds bilaterally; decreased in right base CV: s1s2 regular, no murmur AB: Soft, non tender Ext: 1+ symmetric non-pitting edema Neuro: moves extremities, follows commands  LABS:  Recent Labs Lab 07/17/12 0414  07/19/12 0500 07/20/12 0428 07/20/12 2305 07/21/12 0430 07/21/12 1400 07/21/12 1443  HGB 10.8*  < > 11.0* 11.7* 13.6 13.1  --   --   WBC 4.0  < > 5.2 4.9  --  7.8  --   --   PLT 127*  < > 138* 166  --  177  --   --   NA 134*  < > 135 135 139 139  --   --   K 4.0  < > 3.8 3.8 4.0 4.1  --   --   CL 104  < > 102 100 100 104  --   --   CO2 25  < > 26 26  --  27  --   --   GLUCOSE 108*  < > 108* 103* 92 94  --   --   BUN 12  < > 19 16 15 16   --   --   CREATININE 0.49*  < > 0.51 0.53 0.80 0.57  --   --   CALCIUM 8.0*  < > 8.6 8.7  --  9.2  --   --   MG 2.0  --   --   --   --   --   --   --   PHOS 2.7  --   --   --   --   --   --   --   APTT  --   --   --   --   --   --  38*  --   INR  --   --   --   --   --   --  1.33  --   PHART  --   --   --   --   --   --   --  7.445  PCO2ART  --   --   --   --    --   --   --  34.2*  PO2ART  --   --   --   --   --   --   --  85.0  < > = values in this interval not displayed.  Recent Labs Lab 07/21/12 1724 07/21/12 2102 07/21/12 2330 07/21/12 2357 07/22/12 0432  GLUCAP 99 76 68* 102* 91    CXR: Dg Chest Port 1 View  07/21/2012  *RADIOLOGY REPORT*  Clinical Data: Intubation  PORTABLE CHEST - 1 VIEW  Comparison: Same day  Findings: Endotracheal tube has its tip 4 cm above the carina. Nasogastric tube enters the stomach.  Right internal jugular central line has its tip in the SVC above the right atrium.  There is mild patchy infiltrate in the left lower lobe.  There is right lower lobe infiltrate / collapse.  Left subclavian central line has its tip in the SVC just below the azygos level.  IMPRESSION: Endotracheal tube well positioned with its tip 4 cm above the carina.  Other lines and tubes unchanged and well positioned. Patchy pneumonia left lower lobe and persistent pneumonia/collapsed right lower lobe.   Original Report Authenticated By: Paulina Fusi, M.D.    Dg Chest Port 1 View  07/21/2012  *RADIOLOGY REPORT*  Clinical Data: Shortness of breath and pneumonia.  PORTABLE CHEST - 1 VIEW  Comparison: Multiple recent previous exams.  Findings: 0522 hours.  Endotracheal tube has been removed in the interval. The NG tube passes into the stomach although the distal tip position is not included on the film.  Left subclavian central line remains in place with tip overlying the innominate vein confluence.  Right IJ central line tip projects over the mid SVC. Persistent volume loss noted in the right hemithorax with elevation of the right hemidiaphragm and right base collapse / consolidation. Telemetry leads overlie the chest.  IMPRESSION: Slight increase and right  base collapse / consolidation.  Status post extubation.   Original Report Authenticated By: Kennith Center, M.D.    Dg Chest Port 1 View  07/20/2012  *RADIOLOGY REPORT*  Clinical Data: Pneumonia   PORTABLE CHEST - 1 VIEW  Comparison: Chest radiograph 02/08/ 2014  Findings: Endotracheal tube, NG tube, and right central venous line unchanged.  Stable cardiac silhouette. Left subclavian line also unchanged.  There is a moderate right effusion not changed from prior.  Upper lungs are clear.  No pneumothorax.  IMPRESSION:  1.  Stable support apparatus. 2.  No interval change.  3.  Right lower lobe atelectasis and effusion.   Original Report Authenticated By: Genevive Bi, M.D.     ASSESSMENT / PLAN:  PULMONARY A: Acute respiratory failure due to Myasthenia Gravis, and Aspiration PNA, dramatic reintubated required 210 P:   Extubated 2/9, retubed, TV to 8 cc/kg, rate 18 Keep same MV Plan CPAP PS 5/5, likley after trach will need some pos pressure Will have Dr Molli Knock bronch rt main during/after trach, collapse? F/u CXR in am Plan for trach today Neg  Balance as goal  CARDIOVASCULAR A: LLE DVT P:  Heparin held for trach ASA 81mg  tele  RENAL A:  Volume overload. P:   Negative fluid balance as tolerated >> lasix repeat dose, reduce F/u renal fx F/u and replace electrolytes as needed  GASTROINTESTINAL A:  Dysphagia due to MG. Nutrition. P:   TF held for trach; restart post procedure ppi Likely to require peg, with MG with trach   HEMATOLOGIC A: Anemia of critical illness. Thrombocytopenia >> resolved P:  Heparin back on pending trach in afternoon Cbc in am   INFECTIOUS A:  Aspiration PNA. Completed Abx 2/04. P:   Monitor clinically Will ask for BAL rll, rml with trach   ENDOCRINE A:  Hyperglycemia. P:   Monitor blood sugar on BMET  NEUROLOGIC A: Myasthenia Gravis Anxiety P:   S/p IVIG mestinon  plasma exchange per neurology, likely needs 2 more doses, ordered, d/w neuro Reintubated, propofol addition Avoid paralysis with MG, will use hypnotic  Family contact:  Spencer Nichelson [name] [phone]; back up contact for father: [name]:  [phone] Consented father, all risks, explained, death, bleeding, ptx, infection  Resolved problems >> cardiac arrest 2nd to respiratory failure  BOOTH, ERIN Family Medicine, PGY-2 07/22/2012, 6:24 AM  I have fully examined this patient and agree with above findings.    And edited in full  Ccm time 30 min   Mcarthur Rossetti. Tyson Alias, MD, FACP Pgr: [phone] [facility]

## 2012-07-22 NOTE — H&P (Signed)
Victor [place] is an 57 y.o. male.   Chief Complaint: Myasthenia gravis Severe dysphagia Trach/ resp failure Scheduled for percutaneous gastric tube placement in am  HPI: HTN; Homeless; HLD; trach/resp failure  Past Medical History  Diagnosis Date  . DVT (deep venous thrombosis)   . Hypertension   . High cholesterol     Past Surgical History  Procedure Laterality Date  . Esophagogastroduodenoscopy  07/11/2012    Procedure: ESOPHAGOGASTRODUODENOSCOPY (EGD);  Surgeon: Shirley Friar, MD;  Location: [facility];  Service: Endoscopy;  Laterality: N/[place];  BEDSIDE    History reviewed. No pertinent family history. Social History:  reports that he has never smoked. He does not have any smokeless tobacco history on file. He reports that he does not drink alcohol or use illicit drugs.  Allergies: No Known Allergies  Medications Prior to Admission  Medication Sig Dispense Refill  . aspirin 81 MG chewable tablet Chew 81 mg by mouth every morning.         Results for orders placed during the hospital encounter of 07/10/12 (from the past 48 hour(s))  GLUCOSE, CAPILLARY     Status: None   Collection Time    07/20/12 11:50 AM      Result Value Range   Glucose-Capillary 97  70 - 99 mg/dL  GLUCOSE, CAPILLARY     Status: None   Collection Time    07/20/12  3:21 PM      Result Value Range   Glucose-Capillary 93  70 - 99 mg/dL  GLUCOSE, CAPILLARY     Status: None   Collection Time    07/20/12  7:54 PM      Result Value Range   Glucose-Capillary 97  70 - 99 mg/dL  POCT I-STAT, CHEM 8     Status: None   Collection Time    07/20/12 11:05 PM      Result Value Range   Sodium 139  135 - 145 mEq/L   Potassium 4.0  3.5 - 5.1 mEq/L   Chloride 100  96 - 112 mEq/L   BUN 15  6 - 23 mg/dL   Creatinine, Ser 1.61  0.50 - 1.35 mg/dL   Glucose, Bld 92  70 - 99 mg/dL   Calcium, Ion 0.96  0.45 - 1.23 mmol/L   TCO2 30  0 - 100 mmol/L   Hemoglobin 13.6  13.0 - 17.0 g/dL   HCT 40.9  81.1 -  91.4 %  GLUCOSE, CAPILLARY     Status: None   Collection Time    07/21/12 12:23 AM      Result Value Range   Glucose-Capillary 91  70 - 99 mg/dL  GLUCOSE, CAPILLARY     Status: None   Collection Time    07/21/12  4:23 AM      Result Value Range   Glucose-Capillary 95  70 - 99 mg/dL  BASIC METABOLIC PANEL     Status: None   Collection Time    07/21/12  4:30 AM      Result Value Range   Sodium 139  135 - 145 mEq/L   Potassium 4.1  3.5 - 5.1 mEq/L   Chloride 104  96 - 112 mEq/L   CO2 27  19 - 32 mEq/L   Glucose, Bld 94  70 - 99 mg/dL   BUN 16  6 - 23 mg/dL   Creatinine, Ser 7.82  0.50 - 1.35 mg/dL   Calcium 9.2  8.4 - 95.6 mg/dL   GFR  calc non Af Amer >90  >90 mL/min   GFR calc Af Amer >90  >90 mL/min   Comment:            The eGFR has been calculated     using the CKD EPI equation.     This calculation has not been     validated in all clinical     situations.     eGFR's persistently     <90 mL/min signify     possible Chronic Kidney Disease.  CBC     Status: None   Collection Time    07/21/12  4:30 AM      Result Value Range   WBC 7.8  4.0 - 10.5 K/uL   RBC 4.39  4.22 - 5.81 MIL/uL   Hemoglobin 13.1  13.0 - 17.0 g/dL   HCT 16.1  09.6 - 04.5 %   MCV 90.4  78.0 - 100.0 fL   MCH 29.8  26.0 - 34.0 pg   MCHC 33.0  30.0 - 36.0 g/dL   RDW 40.9  81.1 - 91.4 %   Platelets 177  150 - 400 K/uL  GLUCOSE, CAPILLARY     Status: Abnormal   Collection Time    07/21/12  8:37 AM      Result Value Range   Glucose-Capillary 164 (*) 70 - 99 mg/dL  GLUCOSE, CAPILLARY     Status: Abnormal   Collection Time    07/21/12 12:23 PM      Result Value Range   Glucose-Capillary 101 (*) 70 - 99 mg/dL  HEPARIN LEVEL (UNFRACTIONATED)     Status: None   Collection Time    07/21/12  2:00 PM      Result Value Range   Heparin Unfractionated 0.45  0.30 - 0.70 IU/mL   Comment:            IF HEPARIN RESULTS ARE BELOW     EXPECTED VALUES, AND PATIENT     DOSAGE HAS BEEN CONFIRMED,     SUGGEST  FOLLOW UP TESTING     OF ANTITHROMBIN III LEVELS.  PROTIME-INR     Status: Abnormal   Collection Time    07/21/12  2:00 PM      Result Value Range   Prothrombin Time 16.2 (*) 11.6 - 15.2 seconds   INR 1.33  0.00 - 1.49  APTT     Status: Abnormal   Collection Time    07/21/12  2:00 PM      Result Value Range   aPTT 38 (*) 24 - 37 seconds   Comment:            IF BASELINE aPTT IS ELEVATED,     SUGGEST PATIENT RISK ASSESSMENT     BE USED TO DETERMINE APPROPRIATE     ANTICOAGULANT THERAPY.  POCT I-STAT 3, BLOOD GAS (G3+)     Status: Abnormal   Collection Time    07/21/12  2:43 PM      Result Value Range   pH, Arterial 7.445  7.350 - 7.450   pCO2 arterial 34.2 (*) 35.0 - 45.0 mmHg   pO2, Arterial 85.0  80.0 - 100.0 mmHg   Bicarbonate 23.5  20.0 - 24.0 mEq/L   TCO2 24  0 - 100 mmol/L   O2 Saturation 97.0     Sample type ARTERIAL    GLUCOSE, CAPILLARY     Status: None   Collection Time    07/21/12  5:24 PM  Result Value Range   Glucose-Capillary 99  70 - 99 mg/dL  GLUCOSE, CAPILLARY     Status: None   Collection Time    07/21/12  9:02 PM      Result Value Range   Glucose-Capillary 76  70 - 99 mg/dL  GLUCOSE, CAPILLARY     Status: Abnormal   Collection Time    07/21/12 11:30 PM      Result Value Range   Glucose-Capillary 68 (*) 70 - 99 mg/dL  GLUCOSE, CAPILLARY     Status: Abnormal   Collection Time    07/21/12 11:57 PM      Result Value Range   Glucose-Capillary 102 (*) 70 - 99 mg/dL  HEPARIN LEVEL (UNFRACTIONATED)     Status: None   Collection Time    07/21/12 11:59 PM      Result Value Range   Heparin Unfractionated 0.46  0.30 - 0.70 IU/mL   Comment:            IF HEPARIN RESULTS ARE BELOW     EXPECTED VALUES, AND PATIENT     DOSAGE HAS BEEN CONFIRMED,     SUGGEST FOLLOW UP TESTING     OF ANTITHROMBIN III LEVELS.  GLUCOSE, CAPILLARY     Status: None   Collection Time    07/22/12  4:32 AM      Result Value Range   Glucose-Capillary 91  70 - 99 mg/dL    Comment 1 Documented in Chart     Comment 2 Notify RN    BASIC METABOLIC PANEL     Status: None   Collection Time    07/22/12  6:34 AM      Result Value Range   Sodium 138  135 - 145 mEq/L   Potassium 3.9  3.5 - 5.1 mEq/L   Chloride 100  96 - 112 mEq/L   CO2 26  19 - 32 mEq/L   Glucose, Bld 98  70 - 99 mg/dL   BUN 23  6 - 23 mg/dL   Creatinine, Ser 1.19  0.50 - 1.35 mg/dL   Calcium 9.1  8.4 - 14.7 mg/dL   GFR calc non Af Amer >90  >90 mL/min   GFR calc Af Amer >90  >90 mL/min   Comment:            The eGFR has been calculated     using the CKD EPI equation.     This calculation has not been     validated in all clinical     situations.     eGFR's persistently     <90 mL/min signify     possible Chronic Kidney Disease.  CBC WITH DIFFERENTIAL     Status: Abnormal   Collection Time    07/22/12  6:34 AM      Result Value Range   WBC 7.1  4.0 - 10.5 K/uL   RBC 4.24  4.22 - 5.81 MIL/uL   Hemoglobin 12.8 (*) 13.0 - 17.0 g/dL   HCT 82.9 (*) 56.2 - 13.0 %   MCV 90.1  78.0 - 100.0 fL   MCH 30.2  26.0 - 34.0 pg   MCHC 33.5  30.0 - 36.0 g/dL   RDW 86.5  78.4 - 69.6 %   Platelets 217  150 - 400 K/uL   Neutrophils Relative 66  43 - 77 %   Neutro Abs 4.7  1.7 - 7.7 K/uL   Lymphocytes Relative 22  12 - 46 %  Lymphs Abs 1.5  0.7 - 4.0 K/uL   Monocytes Relative 9  3 - 12 %   Monocytes Absolute 0.6  0.1 - 1.0 K/uL   Eosinophils Relative 2  0 - 5 %   Eosinophils Absolute 0.2  0.0 - 0.7 K/uL   Basophils Relative 1  0 - 1 %   Basophils Absolute 0.1  0.0 - 0.1 K/uL  GLUCOSE, CAPILLARY     Status: None   Collection Time    07/22/12  7:45 AM      Result Value Range   Glucose-Capillary 95  70 - 99 mg/dL   Chest Portable 1 View To Assess Tube Placement And Rule-out Pneumothorax  07/22/2012  *RADIOLOGY REPORT*  Clinical Data: Tracheostomy placement.  PORTABLE CHEST - 1 VIEW  Comparison: 07/22/2012  Findings: Interval placement tracheostomy tube which projects over the mid trachea.  No  pneumothorax.  Left subclavian central line and right internal jugular central line remain in place, unchanged. Right lower lobe airspace disease and possible small right effusion again noted, stable.  Heart is normal size.  Left lung is clear.  IMPRESSION: Tracheostomy tube placement.  No pneumothorax.  Otherwise no significant change.   Original Report Authenticated By: Charlett Nose, M.D.    Dg Chest Port 1 View  07/22/2012  *RADIOLOGY REPORT*  Clinical Data: Intubated.  Follow up of pneumonia.  PORTABLE CHEST - 1 VIEW  Comparison: 07/21/2012  Findings: Endotracheal tube unchanged.  Nasogastric tube extends beyond the  inferior aspect of the film.  Right IJ and left subclavian lines are appropriately positioned with tips at mid to low SVC.  Normal heart size.  Left costophrenic angle minimally excluded. Probable small right pleural effusion. No pneumothorax.  Improved left base aeration with minimal airspace disease remaining. Worsened right base air space disease.  IMPRESSION: Increased right lower lobe airspace disease. Recommend radiographic follow-up until clearing.  Nearly completely resolved left base air space disease.  Probable small right pleural effusion.   Original Report Authenticated By: Jeronimo Greaves, M.D.    Dg Chest Port 1 View  07/21/2012  *RADIOLOGY REPORT*  Clinical Data: Intubation  PORTABLE CHEST - 1 VIEW  Comparison: Same day  Findings: Endotracheal tube has its tip 4 cm above the carina. Nasogastric tube enters the stomach.  Right internal jugular central line has its tip in the SVC above the right atrium.  There is mild patchy infiltrate in the left lower lobe.  There is right lower lobe infiltrate / collapse.  Left subclavian central line has its tip in the SVC just below the azygos level.  IMPRESSION: Endotracheal tube well positioned with its tip 4 cm above the carina.  Other lines and tubes unchanged and well positioned. Patchy pneumonia left lower lobe and persistent  pneumonia/collapsed right lower lobe.   Original Report Authenticated By: Paulina Fusi, M.D.    Dg Chest Port 1 View  07/21/2012  *RADIOLOGY REPORT*  Clinical Data: Shortness of breath and pneumonia.  PORTABLE CHEST - 1 VIEW  Comparison: Multiple recent previous exams.  Findings: 0522 hours.  Endotracheal tube has been removed in the interval. The NG tube passes into the stomach although the distal tip position is not included on the film.  Left subclavian central line remains in place with tip overlying the innominate vein confluence.  Right IJ central line tip projects over the mid SVC. Persistent volume loss noted in the right hemithorax with elevation of the right hemidiaphragm and right base collapse / consolidation. Telemetry leads  overlie the chest.  IMPRESSION: Slight increase and right base collapse / consolidation.  Status post extubation.   Original Report Authenticated By: Kennith Center, M.D.     Review of Systems  Constitutional: Positive for weight loss. Negative for fever.  Neurological: Positive for weakness.    Blood pressure 74/49, pulse 59, temperature 97.4 F (36.3 C), temperature source Oral, resp. rate 18, height 6' (1.829 m), weight 175 lb 0.7 oz (79.4 kg), SpO2 97.00%. Physical Exam  Constitutional: He is oriented to person, place, and time.  Cardiovascular: Normal rate, regular rhythm and normal heart sounds.   No murmur heard. Respiratory: Effort normal. He has wheezes.  trach  GI: Soft. Bowel sounds are normal. There is no tenderness.  Musculoskeletal: Normal range of motion.  Slow but able to move  Neurological: He is alert and oriented to person, place, and time.  Psychiatric: He has [place] normal mood and affect. His behavior is normal. Judgment and thought content normal.  Signs consent     Assessment/Plan Myasthenia gravis Severe dysphagia On trach Scheduled for G tube in am Barium ordered Check kub in am Hold Hep inj 2/12 Ancef ordered  Victor Arts  [place] 07/22/2012, 11:01 AM

## 2012-07-22 NOTE — Progress Notes (Signed)
Subjective: No new complaints. Patient required reintubation because of impending respiratory failure yesterday. He has not experienced diplopia. His been no worsening of ptosis of eyelids. He's developed no weakness involving upper or lower extremities. He is scheduled today for his fourth of 5 plasmapheresis treatments. Patient scheduled for tracheostomy today, also.  Objective: Current vital signs: BP 74/49  Pulse 59  Temp(Src) 97.4 F (36.3 C) (Oral)  Resp 18  Ht 6' (1.829 m)  Wt 79.4 kg (175 lb 0.7 oz)  BMI 23.74 kg/m2  SpO2 97%  Neurologic Exam: Alert and in no acute distress. Mental status was normal. Patient is able to follow commands without difficulty. He had spontaneous respirations above the ventilator setting. Pupils were equal and reacted normally to light. His extraocular movements were full and conjugate with no nystagmus in any field of gaze. Frontalis muscle strength was 5 minus over 5 bilaterally. Orbicularis oculi strength was 4 minus over 5 bilaterally. Strength of his upper and lower extremities was normal proximally and distally. Neck flexor strength was 2+ over 5.  Lab Results: Results for orders placed during the hospital encounter of 07/10/12 (from the past 48 hour(s))  GLUCOSE, CAPILLARY     Status: None   Collection Time    07/20/12 11:50 AM      Result Value Range   Glucose-Capillary 97  70 - 99 mg/dL  GLUCOSE, CAPILLARY     Status: None   Collection Time    07/20/12  3:21 PM      Result Value Range   Glucose-Capillary 93  70 - 99 mg/dL  GLUCOSE, CAPILLARY     Status: None   Collection Time    07/20/12  7:54 PM      Result Value Range   Glucose-Capillary 97  70 - 99 mg/dL  POCT I-STAT, CHEM 8     Status: None   Collection Time    07/20/12 11:05 PM      Result Value Range   Sodium 139  135 - 145 mEq/L   Potassium 4.0  3.5 - 5.1 mEq/L   Chloride 100  96 - 112 mEq/L   BUN 15  6 - 23 mg/dL   Creatinine, Ser 4.78  0.50 - 1.35 mg/dL   Glucose, Bld  92  70 - 99 mg/dL   Calcium, Ion 2.95  6.21 - 1.23 mmol/L   TCO2 30  0 - 100 mmol/L   Hemoglobin 13.6  13.0 - 17.0 g/dL   HCT 30.8  65.7 - 84.6 %  GLUCOSE, CAPILLARY     Status: None   Collection Time    07/21/12 12:23 AM      Result Value Range   Glucose-Capillary 91  70 - 99 mg/dL  GLUCOSE, CAPILLARY     Status: None   Collection Time    07/21/12  4:23 AM      Result Value Range   Glucose-Capillary 95  70 - 99 mg/dL  BASIC METABOLIC PANEL     Status: None   Collection Time    07/21/12  4:30 AM      Result Value Range   Sodium 139  135 - 145 mEq/L   Potassium 4.1  3.5 - 5.1 mEq/L   Chloride 104  96 - 112 mEq/L   CO2 27  19 - 32 mEq/L   Glucose, Bld 94  70 - 99 mg/dL   BUN 16  6 - 23 mg/dL   Creatinine, Ser 9.62  0.50 - 1.35 mg/dL  Calcium 9.2  8.4 - 10.5 mg/dL   GFR calc non Af Amer >90  >90 mL/min   GFR calc Af Amer >90  >90 mL/min   Comment:            The eGFR has been calculated     using the CKD EPI equation.     This calculation has not been     validated in all clinical     situations.     eGFR's persistently     <90 mL/min signify     possible Chronic Kidney Disease.  CBC     Status: None   Collection Time    07/21/12  4:30 AM      Result Value Range   WBC 7.8  4.0 - 10.5 K/uL   RBC 4.39  4.22 - 5.81 MIL/uL   Hemoglobin 13.1  13.0 - 17.0 g/dL   HCT 16.1  09.6 - 04.5 %   MCV 90.4  78.0 - 100.0 fL   MCH 29.8  26.0 - 34.0 pg   MCHC 33.0  30.0 - 36.0 g/dL   RDW 40.9  81.1 - 91.4 %   Platelets 177  150 - 400 K/uL  GLUCOSE, CAPILLARY     Status: Abnormal   Collection Time    07/21/12  8:37 AM      Result Value Range   Glucose-Capillary 164 (*) 70 - 99 mg/dL  GLUCOSE, CAPILLARY     Status: Abnormal   Collection Time    07/21/12 12:23 PM      Result Value Range   Glucose-Capillary 101 (*) 70 - 99 mg/dL  HEPARIN LEVEL (UNFRACTIONATED)     Status: None   Collection Time    07/21/12  2:00 PM      Result Value Range   Heparin Unfractionated 0.45  0.30 -  0.70 IU/mL   Comment:            IF HEPARIN RESULTS ARE BELOW     EXPECTED VALUES, AND PATIENT     DOSAGE HAS BEEN CONFIRMED,     SUGGEST FOLLOW UP TESTING     OF ANTITHROMBIN III LEVELS.  PROTIME-INR     Status: Abnormal   Collection Time    07/21/12  2:00 PM      Result Value Range   Prothrombin Time 16.2 (*) 11.6 - 15.2 seconds   INR 1.33  0.00 - 1.49  APTT     Status: Abnormal   Collection Time    07/21/12  2:00 PM      Result Value Range   aPTT 38 (*) 24 - 37 seconds   Comment:            IF BASELINE aPTT IS ELEVATED,     SUGGEST PATIENT RISK ASSESSMENT     BE USED TO DETERMINE APPROPRIATE     ANTICOAGULANT THERAPY.  POCT I-STAT 3, BLOOD GAS (G3+)     Status: Abnormal   Collection Time    07/21/12  2:43 PM      Result Value Range   pH, Arterial 7.445  7.350 - 7.450   pCO2 arterial 34.2 (*) 35.0 - 45.0 mmHg   pO2, Arterial 85.0  80.0 - 100.0 mmHg   Bicarbonate 23.5  20.0 - 24.0 mEq/L   TCO2 24  0 - 100 mmol/L   O2 Saturation 97.0     Sample type ARTERIAL    GLUCOSE, CAPILLARY     Status: None   Collection Time  07/21/12  5:24 PM      Result Value Range   Glucose-Capillary 99  70 - 99 mg/dL  GLUCOSE, CAPILLARY     Status: None   Collection Time    07/21/12  9:02 PM      Result Value Range   Glucose-Capillary 76  70 - 99 mg/dL  GLUCOSE, CAPILLARY     Status: Abnormal   Collection Time    07/21/12 11:30 PM      Result Value Range   Glucose-Capillary 68 (*) 70 - 99 mg/dL  GLUCOSE, CAPILLARY     Status: Abnormal   Collection Time    07/21/12 11:57 PM      Result Value Range   Glucose-Capillary 102 (*) 70 - 99 mg/dL  HEPARIN LEVEL (UNFRACTIONATED)     Status: None   Collection Time    07/21/12 11:59 PM      Result Value Range   Heparin Unfractionated 0.46  0.30 - 0.70 IU/mL   Comment:            IF HEPARIN RESULTS ARE BELOW     EXPECTED VALUES, AND PATIENT     DOSAGE HAS BEEN CONFIRMED,     SUGGEST FOLLOW UP TESTING     OF ANTITHROMBIN III LEVELS.   GLUCOSE, CAPILLARY     Status: None   Collection Time    07/22/12  4:32 AM      Result Value Range   Glucose-Capillary 91  70 - 99 mg/dL   Comment 1 Documented in Chart     Comment 2 Notify RN    BASIC METABOLIC PANEL     Status: None   Collection Time    07/22/12  6:34 AM      Result Value Range   Sodium 138  135 - 145 mEq/L   Potassium 3.9  3.5 - 5.1 mEq/L   Chloride 100  96 - 112 mEq/L   CO2 26  19 - 32 mEq/L   Glucose, Bld 98  70 - 99 mg/dL   BUN 23  6 - 23 mg/dL   Creatinine, Ser 1.32  0.50 - 1.35 mg/dL   Calcium 9.1  8.4 - 44.0 mg/dL   GFR calc non Af Amer >90  >90 mL/min   GFR calc Af Amer >90  >90 mL/min   Comment:            The eGFR has been calculated     using the CKD EPI equation.     This calculation has not been     validated in all clinical     situations.     eGFR's persistently     <90 mL/min signify     possible Chronic Kidney Disease.  CBC WITH DIFFERENTIAL     Status: Abnormal   Collection Time    07/22/12  6:34 AM      Result Value Range   WBC 7.1  4.0 - 10.5 K/uL   RBC 4.24  4.22 - 5.81 MIL/uL   Hemoglobin 12.8 (*) 13.0 - 17.0 g/dL   HCT 10.2 (*) 72.5 - 36.6 %   MCV 90.1  78.0 - 100.0 fL   MCH 30.2  26.0 - 34.0 pg   MCHC 33.5  30.0 - 36.0 g/dL   RDW 44.0  34.7 - 42.5 %   Platelets 217  150 - 400 K/uL   Neutrophils Relative 66  43 - 77 %   Neutro Abs 4.7  1.7 - 7.7 K/uL  Lymphocytes Relative 22  12 - 46 %   Lymphs Abs 1.5  0.7 - 4.0 K/uL   Monocytes Relative 9  3 - 12 %   Monocytes Absolute 0.6  0.1 - 1.0 K/uL   Eosinophils Relative 2  0 - 5 %   Eosinophils Absolute 0.2  0.0 - 0.7 K/uL   Basophils Relative 1  0 - 1 %   Basophils Absolute 0.1  0.0 - 0.1 K/uL  GLUCOSE, CAPILLARY     Status: None   Collection Time    07/22/12  7:45 AM      Result Value Range   Glucose-Capillary 95  70 - 99 mg/dL    Studies/Results: Chest Portable 1 View To Assess Tube Placement And Rule-out Pneumothorax  07/22/2012  *RADIOLOGY REPORT*  Clinical Data:  Tracheostomy placement.  PORTABLE CHEST - 1 VIEW  Comparison: 07/22/2012  Findings: Interval placement tracheostomy tube which projects over the mid trachea.  No pneumothorax.  Left subclavian central line and right internal jugular central line remain in place, unchanged. Right lower lobe airspace disease and possible small right effusion again noted, stable.  Heart is normal size.  Left lung is clear.  IMPRESSION: Tracheostomy tube placement.  No pneumothorax.  Otherwise no significant change.   Original Report Authenticated By: Charlett Nose, M.D.    Dg Chest Port 1 View  07/22/2012  *RADIOLOGY REPORT*  Clinical Data: Intubated.  Follow up of pneumonia.  PORTABLE CHEST - 1 VIEW  Comparison: 07/21/2012  Findings: Endotracheal tube unchanged.  Nasogastric tube extends beyond the  inferior aspect of the film.  Right IJ and left subclavian lines are appropriately positioned with tips at mid to low SVC.  Normal heart size.  Left costophrenic angle minimally excluded. Probable small right pleural effusion. No pneumothorax.  Improved left base aeration with minimal airspace disease remaining. Worsened right base air space disease.  IMPRESSION: Increased right lower lobe airspace disease. Recommend radiographic follow-up until clearing.  Nearly completely resolved left base air space disease.  Probable small right pleural effusion.   Original Report Authenticated By: Jeronimo Greaves, M.D.    Dg Chest Port 1 View  07/21/2012  *RADIOLOGY REPORT*  Clinical Data: Intubation  PORTABLE CHEST - 1 VIEW  Comparison: Same day  Findings: Endotracheal tube has its tip 4 cm above the carina. Nasogastric tube enters the stomach.  Right internal jugular central line has its tip in the SVC above the right atrium.  There is mild patchy infiltrate in the left lower lobe.  There is right lower lobe infiltrate / collapse.  Left subclavian central line has its tip in the SVC just below the azygos level.  IMPRESSION: Endotracheal tube well  positioned with its tip 4 cm above the carina.  Other lines and tubes unchanged and well positioned. Patchy pneumonia left lower lobe and persistent pneumonia/collapsed right lower lobe.   Original Report Authenticated By: Paulina Fusi, M.D.    Dg Chest Port 1 View  07/21/2012  *RADIOLOGY REPORT*  Clinical Data: Shortness of breath and pneumonia.  PORTABLE CHEST - 1 VIEW  Comparison: Multiple recent previous exams.  Findings: 0522 hours.  Endotracheal tube has been removed in the interval. The NG tube passes into the stomach although the distal tip position is not included on the film.  Left subclavian central line remains in place with tip overlying the innominate vein confluence.  Right IJ central line tip projects over the mid SVC. Persistent volume loss noted in the right hemithorax with elevation  of the right hemidiaphragm and right base collapse / consolidation. Telemetry leads overlie the chest.  IMPRESSION: Slight increase and right base collapse / consolidation.  Status post extubation.   Original Report Authenticated By: Kennith Center, M.D.     Medications:  I have reviewed the patient's current medications. Scheduled: . therapeutic plasma exchange solution   Dialysis Q1 Hr x 4  . albuterol  2.5 mg Nebulization Q8H  . antiseptic oral rinse  15 mL Mouth Rinse QID  . aspirin  81 mg Oral Daily  . chlorhexidine  15 mL Mouth Rinse BID  . furosemide  20 mg Intravenous Once  . heparin  1,000 Units Intracatheter Once  . heparin  1,000 Units Intracatheter Once  . heparin  1,000 Units Intracatheter Once  . heparin lock flush  500 Units Intravenous Once  . pantoprazole (PROTONIX) IV  40 mg Intravenous Q24H  . pyridostigmine  39.6 mg Oral Q4H  . sodium chloride  10-40 mL Intracatheter Q12H   Continuous: . citrate dextrose 500 mL (07/17/12 0041)  . citrate dextrose    . propofol 10.365 mcg/kg/min (07/21/12 2057)   UEA:VWUJWJ chloride, acetaminophen, ALPRAZolam, calcium carbonate, calcium  gluconate IVPB, calcium gluconate IVPB, calcium gluconate, diphenhydrAMINE, fentaNYL, phenol, sodium chloride  Assessment/Plan: Myasthenia gravis with exacerbation of respiratory function yesterday requiring reintubation for impending respiratory failure. His been no deterioration in facial strength and patient has continued to have normal extraocular movements with no diplopia. He still has significant dysphagia, however, in addition to respiratory difficulty.  We will continue with as course of immunotherapy with plasmapheresis with plan for 5 treatments. This may require extension to 7-8 treatments however depending on his clinical course at the end of 5 treatments. I will keep the Mestinon dose unchanged for now.  C.R. Roseanne Reno, MD [facility] [phone]  07/22/2012  10:13 AM

## 2012-07-22 NOTE — Progress Notes (Signed)
Hypoglycemic Event  CBG: 68  Treatment: 240 orange juice down OG tube  Symptoms: None  Follow-up CBG: Time:2345 CBG Result:104  Possible Reasons for Event: Inadequate meal intake

## 2012-07-22 NOTE — Procedures (Signed)
Bedside Tracheostomy Insertion Procedure Note   Patient Details:   Name: Victor Durham DOB: 10/20/1955 MRN: [id number]  Procedure: Tracheostomy  Pre Procedure Assessment: ET Tube Size:7.5 ET Tube secured at lip (cm):23 Bite block in place: Yes Breath Sounds: Clear  Post Procedure Assessment: BP 74/49  Pulse 59  Temp(Src) 97.4 F (36.3 C) (Oral)  Resp 18  Ht 6' (1.829 m)  Wt 175 lb 0.7 oz (79.4 kg)  BMI 23.74 kg/m2  SpO2 97% O2 sats: stable throughout Complications: No apparent complications Patient did tolerate procedure well Tracheostomy Brand:Shiley Tracheostomy Style:Cuffed Tracheostomy Size: 8.0 Tracheostomy Secured WUJ:[id number] Tracheostomy Placement Confirmation:Trach cuff visualized and in place    Leonard Downing 07/22/2012, 9:53 AM

## 2012-07-22 NOTE — Procedures (Signed)
Bronchoscopy Procedure Note Victor Durham [id number] 02-24-1956  Procedure: Bronchoscopy Indications: Obtain specimens for culture and/or other diagnostic studies  Procedure Details Consent: Risks of procedure as well as the alternatives and risks of each were explained to the (patient/caregiver).  Consent for procedure obtained. Time Out: Verified patient identification, verified procedure, site/side was marked, verified correct patient position, special equipment/implants available, medications/allergies/relevent history reviewed, required imaging and test results available.  Performed  In preparation for procedure, patient was given 100% FiO2 and bronchoscope lubricated. Sedation: Benzodiazepines and Etomidate  Airway entered and the following bronchi were examined: RUL, RML, RLL, LUL, LLL and Bronchi.   Procedures performed: Brushings performed Bronchoscope removed.    Evaluation Hemodynamic Status: BP stable throughout; O2 sats: stable throughout Patient's Current Condition: stable Specimens:  Sent purulent fluid Complications: No apparent complications Patient did tolerate procedure well.   Victor Durham 07/22/2012

## 2012-07-23 ENCOUNTER — Inpatient Hospital Stay (HOSPITAL_COMMUNITY): Payer: Medicaid Other

## 2012-07-23 LAB — BASIC METABOLIC PANEL
Calcium: 8.9 mg/dL (ref 8.4–10.5)
Creatinine, Ser: 0.61 mg/dL (ref 0.50–1.35)
GFR calc non Af Amer: >90 mL/min (ref >90)
Glucose, Bld: 90 mg/dL (ref 70–99)
Sodium: 141 mEq/L (ref 135–145)

## 2012-07-23 LAB — CBC
HCT: 37.9 % — ABNORMAL LOW (ref 39.0–52.0)
Hemoglobin: 12.4 g/dL — ABNORMAL LOW (ref 13.0–17.0)
MCH: 29.7 pg (ref 26.0–34.0)
MCHC: 32.7 g/dL (ref 30.0–36.0)
RDW: 15 % (ref 11.5–15.5)

## 2012-07-23 LAB — GLUCOSE, CAPILLARY
Glucose-Capillary: 86 mg/dL (ref 70–99)
Glucose-Capillary: 90 mg/dL (ref 70–99)
Glucose-Capillary: 92 mg/dL (ref 70–99)

## 2012-07-23 LAB — POCT I-STAT, CHEM 8
BUN: 24 mg/dL — ABNORMAL HIGH (ref 6–23)
Creatinine, Ser: 1 mg/dL (ref 0.50–1.35)
Glucose, Bld: 97 mg/dL (ref 70–99)
Hemoglobin: 13.9 g/dL (ref 13.0–17.0)
Potassium: 3.6 mEq/L (ref 3.5–5.1)

## 2012-07-23 MED ORDER — HEPARIN (PORCINE) IN NACL 100-0.45 UNIT/ML-% IJ SOLN
1750.0000 [IU]/h | INTRAMUSCULAR | Status: DC
  Administered 2012-07-23: 1750 [IU]/h via INTRAVENOUS
  Filled 2012-07-23 (×2): qty 250

## 2012-07-23 MED ORDER — FENTANYL CITRATE 0.05 MG/ML IJ SOLN
INTRAMUSCULAR | Status: AC
  Filled 2012-07-23: qty 2

## 2012-07-23 MED ORDER — POTASSIUM CHLORIDE 10 MEQ/50ML IV SOLN: 10.0000 meq | INTRAVENOUS | Status: DC

## 2012-07-23 MED ORDER — HEPARIN (PORCINE) IN NACL 100-0.45 UNIT/ML-% IJ SOLN
1750.0000 [IU]/h | INTRAMUSCULAR | Status: AC
  Filled 2012-07-23: qty 250

## 2012-07-23 MED ORDER — FUROSEMIDE 10 MG/ML IJ SOLN
20.0000 mg | Freq: Once | INTRAMUSCULAR | Status: AC
  Administered 2012-07-23: 20 mg via INTRAVENOUS
  Filled 2012-07-23: qty 2

## 2012-07-23 MED ORDER — PANTOPRAZOLE SODIUM 40 MG PO PACK
40.0000 mg | PACK | Freq: Every day | ORAL | Status: DC
  Administered 2012-07-23 – 2012-08-01 (×10): 40 mg
  Filled 2012-07-23 (×10): qty 20

## 2012-07-23 MED ORDER — LACTULOSE 10 GM/15ML PO SOLN
30.0000 g | Freq: Every day | ORAL | Status: DC | PRN
  Filled 2012-07-23: qty 45

## 2012-07-23 MED ORDER — CEFAZOLIN SODIUM 1-5 GM-% IV SOLN
1.0000 g | INTRAVENOUS | Status: AC
  Administered 2012-07-24: 1 g via INTRAVENOUS
  Filled 2012-07-23: qty 50

## 2012-07-23 MED ORDER — ETOMIDATE 2 MG/ML IV SOLN
INTRAVENOUS | Status: AC
  Administered 2012-07-23: 10 mg
  Filled 2012-07-23: qty 10

## 2012-07-23 MED ORDER — MIDAZOLAM HCL 2 MG/2ML IJ SOLN
INTRAMUSCULAR | Status: AC
  Filled 2012-07-23: qty 2

## 2012-07-23 MED ORDER — POTASSIUM CHLORIDE CRYS ER 20 MEQ PO TBCR
20.0000 meq | EXTENDED_RELEASE_TABLET | Freq: Once | ORAL | Status: AC
  Administered 2012-07-23: 20 meq via ORAL
  Filled 2012-07-23: qty 1

## 2012-07-23 NOTE — Progress Notes (Signed)
Placed on trach collar and now back to full support. Patient will possibly require sedation to place NG.

## 2012-07-23 NOTE — Procedures (Signed)
NGT placement  Placed 16 french NGT rt nares Did well No bleeding Air heard Will obtain film  Mcarthur Rossetti. Tyson Alias, MD, FACP Pgr: [phone] [facility] '

## 2012-07-23 NOTE — Progress Notes (Signed)
ANTICOAGULATION CONSULT NOTE - Follow Up Consult  Pharmacy Consult:  Heparin Indication: DVT  No Known Allergies  Patient Measurements: Height: 6' (182.9 cm) Weight: 171 lb 11.8 oz (77.9 kg) IBW/kg (Calculated) : 77.6  Vital Signs: Temp: 98 F (36.7 C) (02/12 0838) Temp src: Oral (02/12 0838) BP: 99/55 mmHg (02/12 1000) Pulse Rate: 79 (02/12 1000)  Labs:  Recent Labs  07/21/12 0430 07/21/12 1400 07/21/12 2359 07/22/12 0634 07/22/12 1200 07/22/12 1945 07/23/12 0435 07/23/12 0439  HGB 13.1  --   --  12.8* 13.9  --  12.4*  --   HCT 39.7  --   --  38.2* 41.0  --  37.9*  --   PLT 177  --   --  217  --   --  202  --   APTT  --  38*  --   --   --   --   --   --   LABPROT  --  16.2*  --   --   --   --   --   --   INR  --  1.33  --   --   --   --   --   --   HEPARINUNFRC  --  0.45 0.46  --   --  0.18*  --   --   CREATININE 0.57  --   --  0.68 1.00  --   --  0.61    Estimated Creatinine Clearance: 113.2 ml/min (by C-G formula based on Cr of 0.61).  Assessment: 57 yo male with LLE DVT for heparin. Heparin has been on hold since 0330 this a.m. for Gtube placement. Gtube unable to be replaced due to no barium given yesterday- plan now for Gtube tomorrow. Plan to restart heparin now and then hold again in a.m.  Goal of Therapy: Heparin level 0.3-0.7 Monitor plts per protocol  Plan: 1) Restart heparin at 1750 units/hr 2) Check 6 hour heparin level past restart 3) Will not order daily as off in a.m.  Christoper Fabian, PharmD, BCPS Clinical pharmacist, pager [phone] 07/23/2012, 11:29 AM

## 2012-07-23 NOTE — Progress Notes (Signed)
ANTICOAGULATION CONSULT NOTE - Follow Up Consult    HL = 0.44 (goal 0.3 - 0.7 units/mL) Heparin weight = 78 kg   Assessment: 56 YOM continues on IV heparin for +DVT.  Heparin level therapeutic, no bleeding reported.  Noted patient to be off heparin for PEG tube placement tomorrow AM.  Plan: - Continue heparin gtt at 1750 units/hr - F/U post procedure to resume anticoagulation     Kaelie Henigan D. Laney Potash, PharmD, BCPS Pager:  [phone] 07/23/2012, 7:05 PM

## 2012-07-23 NOTE — Progress Notes (Signed)
Subjective: Patient trached.  S/p 4 of 5 plasmaphoresis treatments.  Last treatment to be administered tomorrow.  Stable on Mestinon.  Objective: Current vital signs: BP 98/57  Pulse 72  Temp(Src) 98 F (36.7 C) (Oral)  Resp 24  Ht 6' (1.829 m)  Wt 77.9 kg (171 lb 11.8 oz)  BMI 23.29 kg/m2  SpO2 96% Vital signs in last 24 hours: Temp:  [98 F (36.7 C)-100 F (37.8 C)] 98 F (36.7 C) (02/12 1526) Pulse Rate:  [62-79] 72 (02/12 1835) Resp:  [16-24] 24 (02/12 1835) BP: (86-115)/(47-66) 98/57 mmHg (02/12 1500) SpO2:  [93 %-100 %] 96 % (02/12 1835) FiO2 (%):  [35 %-40.1 %] 40 % (02/12 1835) Weight:  [77.9 kg (171 lb 11.8 oz)] 77.9 kg (171 lb 11.8 oz) (02/12 0230)  Intake/Output from previous day: 02/11 0701 - 02/12 0700 In: 1032.6 [I.V.:1032.6] Out: 1270 [Urine:1270] Intake/Output this shift: Total I/O In: 259 [I.V.:209; IV Piggyback:50] Out: 200 [Urine:200] Nutritional status: NPO  Neurologic Exam: Mental Status: Alert.  Able to follow 3 step commands without difficulty. Cranial Nerves: II: Discs flat bilaterally; Visual fields grossly normal, pupils equal, round, reactive to light and accommodation III,IV, VI: Left ptosis, decreased right downward gaze V,VII: mild left facial droop, facial light touch sensation normal bilaterally VIII: hearing normal bilaterally IX,X: gag reflex not tested XI: bilateral shoulder shrug XII: midline tongue extension Motor: Right : Upper extremity   5/5    Left:     Upper extremity   5/5  Lower extremity   5/5     Lower extremity   5/5 Tone and bulk:normal tone throughout; no atrophy noted Sensory: Pinprick and light touch intact throughout, bilaterally Deep Tendon Reflexes: 2+ and symmetric throughout Plantars: Right: downgoing   Left: downgoing  Lab Results: Basic Metabolic Panel:  Recent Labs Lab 07/17/12 0414  07/19/12 0500 07/20/12 0428 07/20/12 2305 07/21/12 0430 07/22/12 0634 07/22/12 1200 07/23/12 0439  NA 134*   < > 135 135 139 139 138 140 141  K 4.0  < > 3.8 3.8 4.0 4.1 3.9 3.6 3.7  CL 104  < > 102 100 100 104 100 101 106  CO2 25  < > 26 26  --  27 26  --  24  GLUCOSE 108*  < > 108* 103* 92 94 98 97 90  BUN 12  < > 19 16 15 16 23  24* 26*  CREATININE 0.49*  < > 0.51 0.53 0.80 0.57 0.68 1.00 0.61  CALCIUM 8.0*  < > 8.6 8.7  --  9.2 9.1  --  8.9  MG 2.0  --   --   --   --   --   --   --   --   PHOS 2.7  --   --   --   --   --   --   --   --   < > = values in this interval not displayed.  Liver Function Tests: No results found for this basename: AST, ALT, ALKPHOS, BILITOT, PROT, ALBUMIN,  in the last 168 hours No results found for this basename: LIPASE, AMYLASE,  in the last 168 hours No results found for this basename: AMMONIA,  in the last 168 hours  CBC:  Recent Labs Lab 07/19/12 0500 07/20/12 0428 07/20/12 2305 07/21/12 0430 07/22/12 0634 07/22/12 1200 07/23/12 0435  WBC 5.2 4.9  --  7.8 7.1  --  8.4  NEUTROABS  --   --   --   --  4.7  --   --   HGB 11.0* 11.7* 13.6 13.1 12.8* 13.9 12.4*  HCT 32.6* 34.6* 40.0 39.7 38.2* 41.0 37.9*  MCV 89.1 89.2  --  90.4 90.1  --  90.9  PLT 138* 166  --  177 217  --  202    Cardiac Enzymes: No results found for this basename: CKTOTAL, CKMB, CKMBINDEX, TROPONINI,  in the last 168 hours  Lipid Panel: No results found for this basename: CHOL, TRIG, HDL, CHOLHDL, VLDL, LDLCALC,  in the last 168 hours  CBG:  Recent Labs Lab 07/23/12 0035 07/23/12 0338 07/23/12 0836 07/23/12 1212 07/23/12 1517  GLUCAP 92 90 90 90 86    Microbiology: Results for orders placed during the hospital encounter of 07/10/12  CULTURE, BLOOD (ROUTINE X 2)     Status: None   Collection Time    07/10/12 11:42 PM      Result Value Range Status   Specimen Description BLOOD LH   Final   Special Requests     Final   Value: NONE BOTTLES DRAWN AEROBIC AND ANAEROBIC 2CC] BOTTLES DRAWN AEROBIC ONLY 2CC   Culture  Setup Time 07/11/2012 09:10   Final   Culture NO GROWTH  5 DAYS   Final   Report Status 07/17/2012 FINAL   Final  CULTURE, BLOOD (ROUTINE X 2)     Status: None   Collection Time    07/10/12 11:47 PM      Result Value Range Status   Specimen Description BLOOD LW   Final   Special Requests NONE BOTTLES DRAWN AEROBIC AND ANAEROBIC 3CC   Final   Culture  Setup Time 07/11/2012 09:10   Final   Culture NO GROWTH 5 DAYS   Final   Report Status 07/17/2012 FINAL   Final  MRSA PCR SCREENING     Status: None   Collection Time    07/11/12  3:35 AM      Result Value Range Status   MRSA by PCR NEGATIVE  NEGATIVE Final   Comment:            The GeneXpert MRSA Assay (FDA     approved for NASAL specimens     only), is one component of a     comprehensive MRSA colonization     surveillance program. It is not     intended to diagnose MRSA     infection nor to guide or     monitor treatment for     MRSA infections.  URINE CULTURE     Status: None   Collection Time    07/11/12  6:09 AM      Result Value Range Status   Specimen Description URINE, CATHETERIZED   Final   Special Requests NONE   Final   Culture  Setup Time 07/11/2012 09:18   Final   Colony Count NO GROWTH   Final   Culture NO GROWTH   Final   Report Status 07/12/2012 FINAL   Final  CULTURE, BLOOD (ROUTINE X 2)     Status: None   Collection Time    07/12/12  1:50 AM      Result Value Range Status   Specimen Description BLOOD LEFT HAND   Final   Special Requests BOTTLES DRAWN AEROBIC AND ANAEROBIC 2.5ML   Final   Culture  Setup Time 07/12/2012 16:20   Final   Culture NO GROWTH 5 DAYS   Final   Report Status 07/18/2012 FINAL   Final  CULTURE, BLOOD (ROUTINE X 2)     Status: None   Collection Time    07/12/12  2:00 AM      Result Value Range Status   Specimen Description BLOOD LEFT ARM   Final   Special Requests BOTTLES DRAWN AEROBIC AND ANAEROBIC   Final   Culture  Setup Time 07/12/2012 16:20   Final   Culture NO GROWTH 5 DAYS   Final   Report Status 07/18/2012 FINAL   Final   CULTURE, RESPIRATORY     Status: None   Collection Time    07/13/12  5:57 AM      Result Value Range Status   Specimen Description TRACHEAL ASPIRATE   Final   Special Requests NONE   Final   Gram Stain     Final   Value: FEW WBC PRESENT, PREDOMINANTLY PMN     RARE SQUAMOUS EPITHELIAL CELLS PRESENT     NO ORGANISMS SEEN   Culture RARE CANDIDA ALBICANS   Final   Report Status 07/15/2012 FINAL   Final  MRSA PCR SCREENING     Status: None   Collection Time    07/16/12  3:54 PM      Result Value Range Status   MRSA by PCR NEGATIVE  NEGATIVE Final   Comment:            The GeneXpert MRSA Assay (FDA     approved for NASAL specimens     only), is one component of a     comprehensive MRSA colonization     surveillance program. It is not     intended to diagnose MRSA     infection nor to guide or     monitor treatment for     MRSA infections.  URINE CULTURE     Status: None   Collection Time    07/16/12  3:54 PM      Result Value Range Status   Specimen Description URINE, CATHETERIZED   Final   Special Requests NONE   Final   Culture  Setup Time 07/16/2012 16:54   Final   Colony Count NO GROWTH   Final   Culture NO GROWTH   Final   Report Status 07/17/2012 FINAL   Final  CULTURE, BAL-QUANTITATIVE     Status: None   Collection Time    07/18/12 12:41 PM      Result Value Range Status   Specimen Description BRONCHIAL ALVEOLAR LAVAGE   Final   Special Requests RLL   Final   Gram Stain     Final   Value: RARE WBC PRESENT, PREDOMINANTLY PMN     RARE SQUAMOUS EPITHELIAL CELLS PRESENT     NO ORGANISMS SEEN   Colony Count 5,000 COLONIES/ML   Final   Culture Non-Pathogenic Oropharyngeal-type Flora Isolated.   Final   Report Status 07/20/2012 FINAL   Final  FUNGUS CULTURE W SMEAR     Status: None   Collection Time    07/21/12  5:48 PM      Result Value Range Status   Specimen Description TRACHEAL ASPIRATE   Final   Special Requests NONE   Final   Fungal Smear NO YEAST OR FUNGAL  ELEMENTS SEEN   Final   Culture CULTURE IN PROGRESS FOR FOUR WEEKS   Final   Report Status PENDING   Incomplete  AFB CULTURE WITH SMEAR     Status: None   Collection Time    07/21/12  5:49 PM  Result Value Range Status   Specimen Description TRACHEAL ASPIRATE   Final   Special Requests NONE   Final   ACID FAST SMEAR NO ACID FAST BACILLI SEEN   Final   Culture     Final   Value: CULTURE WILL BE EXAMINED FOR 6 WEEKS BEFORE ISSUING A FINAL REPORT   Report Status PENDING   Incomplete  CULTURE, BAL-QUANTITATIVE     Status: None   Collection Time    07/22/12 10:50 AM      Result Value Range Status   Specimen Description BRONCHIAL ALVEOLAR LAVAGE   Final   Special Requests NONE   Final   Gram Stain     Final   Value: RARE WBC PRESENT, PREDOMINANTLY PMN     RARE SQUAMOUS EPITHELIAL CELLS PRESENT     NO ORGANISMS SEEN   Colony Count PENDING   Incomplete   Culture Culture reincubated for better growth   Final   Report Status PENDING   Incomplete    Coagulation Studies:  Recent Labs  07/21/12 1400  LABPROT 16.2*  INR 1.33    Imaging: Chest Portable 1 View To Assess Tube Placement And Rule-out Pneumothorax  07/22/2012  *RADIOLOGY REPORT*  Clinical Data: Tracheostomy placement.  PORTABLE CHEST - 1 VIEW  Comparison: 07/22/2012  Findings: Interval placement tracheostomy tube which projects over the mid trachea.  No pneumothorax.  Left subclavian central line and right internal jugular central line remain in place, unchanged. Right lower lobe airspace disease and possible small right effusion again noted, stable.  Heart is normal size.  Left lung is clear.  IMPRESSION: Tracheostomy tube placement.  No pneumothorax.  Otherwise no significant change.   Original Report Authenticated By: Charlett Nose, M.D.    Dg Chest Port 1 View  07/22/2012  *RADIOLOGY REPORT*  Clinical Data: Intubated.  Follow up of pneumonia.  PORTABLE CHEST - 1 VIEW  Comparison: 07/21/2012  Findings: Endotracheal tube  unchanged.  Nasogastric tube extends beyond the  inferior aspect of the film.  Right IJ and left subclavian lines are appropriately positioned with tips at mid to low SVC.  Normal heart size.  Left costophrenic angle minimally excluded. Probable small right pleural effusion. No pneumothorax.  Improved left base aeration with minimal airspace disease remaining. Worsened right base air space disease.  IMPRESSION: Increased right lower lobe airspace disease. Recommend radiographic follow-up until clearing.  Nearly completely resolved left base air space disease.  Probable small right pleural effusion.   Original Report Authenticated By: Jeronimo Greaves, M.D.    Dg Abd Portable 1v  07/23/2012  *RADIOLOGY REPORT*  Clinical Data: Nasogastric tube placement  PORTABLE ABDOMEN - 1 VIEW  Comparison: Portable exam 1158 hours without priors for comparison  Findings: Nasogastric tube tip projects over right upper quadrant at expected position of the pylorus or duodenal bulb. Bowel gas pattern normal. No pathologic calcifications or acute osseous findings.  IMPRESSION: Tip of nasogastric tube projects over the right upper quadrant over the expected position of the pylorus or duodenal bulb.   Original Report Authenticated By: Ulyses Southward, M.D.     Medications:  I have reviewed the patient's current medications. Scheduled: . albuterol  2.5 mg Nebulization Q8H  . antiseptic oral rinse  15 mL Mouth Rinse QID  . aspirin  81 mg Oral Daily  . [START ON 07/24/2012]  ceFAZolin (ANCEF) IV  1 g Intravenous On Call  . chlorhexidine  15 mL Mouth Rinse BID  . heparin  1,000 Units Intracatheter Once  .  heparin  1,000 Units Intracatheter Once  . heparin  1,000 Units Intracatheter Once  . heparin lock flush  500 Units Intravenous Once  . pantoprazole sodium  40 mg Per Tube Daily  . pyridostigmine  39.6 mg Oral Q4H  . sodium chloride  10-40 mL Intracatheter Q12H    Assessment/Plan: MG   Assessment:  Patient to have last  plasmaphoresis tomorrow.  Remains on Mestinon.     Plan:  Continue Mestinon at current dose              TPE tomorrow    LOS: 13 days   Thana Farr, MD [facility] [phone] 07/23/2012  6:57 PM

## 2012-07-23 NOTE — Progress Notes (Signed)
SLP Cancellation Note  Patient Details Name: ZAKARIYYA HELFMAN MRN: [id number] DOB: 1955/08/31   Cancelled treatment:  ST received order for PMSV evaluation.  Evaluation deferred to 07/24/12.  Moreen Fowler MS, CCC-SLP [phone] [facility] 07/23/2012, 2:53 PM

## 2012-07-23 NOTE — Progress Notes (Signed)
For traach

## 2012-07-23 NOTE — Progress Notes (Signed)
Pt placed back on full support per his request.  Felt like he was beginning to get tired.

## 2012-07-23 NOTE — Progress Notes (Signed)
PULMONARY  / CRITICAL CARE MEDICINE  Name: Victor Durham MRN: [id number] DOB: 1955-07-11    ADMISSION DATE:  07/10/2012 CONSULTATION DATE:  1/30/20143  REFERRING MD :  Juleen China  CHIEF COMPLAINT:  Dysphagia  BRIEF PATIENT DESCRIPTION: 57 y/o homeless male presented to the [facility] ED On 1/30 for the third time in two weeks for evaluation of dysphagia.  He complained of some difficulty breathing and underwent a neck CT.  Shortly after that procedure he developed respiratory and cardiac arrest.  PCCM called for admission.  SIGNIFICANT EVENTS / STUDIES:  1/30 respiratory and cardiac arrest in ED 1/30 CT head: hypoattenuation R midbrain extending into L pons, poorly characterized due to streak artifact 130 Echocardiogram: LVEF normal. Mild to moderate RV dilatation 1/31 EGD gastritis and esophagitis but no mass or bleeding source 1/31 LE venous duplex scans: LLE DVT 1/31 Thick secretions -plugged ETT during MRI 1/31 MRI -Scattered subcortical T2 hyperintensities -nonspecific, chronic microvascular ischemia vs a demyelinating process  2/1 Neurology consult: concern for myasthenia gravis 2/1 CT chest - no thymoma or central PE, Bilateral lower lobe consolidation and atelectasis , Nonspecific enhancing solid 4.1 cm mass exophytic from segment 2 of the left hepatic lobe  2/2 Mestinon started - intolerant due to excessive secretions 2/3 IVIG initiated by Neuro 2/4 Acetylcholine Receptor Ab 127.00 (<=0.30 mmol/L) 2/4 MRI cervical spine >> normal 2/4 Mestinon retried at a decreased dose 2-4 tx to cone. 2-7 Bronchoscopy 2/9- extubated 2/10 re intubated 2/11 trach  LINES / TUBES: 1/30 ETT >> 2/9 1/30 L [place] CVL >> 2/5  RtI J HD Cath insertion>>   CULTURES: 1/30 blood >> NEG 1/30 urine >> NEG 2-2 sputum>> NEG 2-1 bc x 2 >> NEG 2-7 BAL >> oral flora  ANTIBIOTICS: unasyn 1/30 >> 2/4 levaquin 1/30 >> 2/4  SUBJECTIVE:  Trach perfomed 2/11  VITAL SIGNS: Temp:  [98.2 F (36.8 C)-100 F  (37.8 C)] 98.8 F (37.1 C) (02/12 0339) Pulse Rate:  [59-79] 63 (02/12 0700) Resp:  [17-22] 18 (02/12 0700) BP: (74-122)/(43-71) 93/49 mmHg (02/12 0700) SpO2:  [92 %-100 %] 98 % (02/12 0700) FiO2 (%):  [39.7 %-100 %] 40 % (02/12 0700) Weight:  [171 lb 11.8 oz (77.9 kg)] 171 lb 11.8 oz (77.9 kg) (02/12 0230) VENTILATOR SETTINGS: Vent Mode:  [-] PRVC FiO2 (%):  [39.7 %-100 %] 40 % Set Rate:  [18 bmp] 18 bmp Vt Set:  [550 mL] 550 mL PEEP:  [5 cmH20] 5 cmH20 Plateau Pressure:  [15 cmH20-18 cmH20] 15 cmH20 INTAKE / OUTPUT: Intake/Output     02/11 0701 - 02/12 0700 02/12 0701 - 02/13 0700   I.V. (mL/kg) 1012.6 (13)    Total Intake(mL/kg) 1012.6 (13)    Urine (mL/kg/hr) 1270 (0.7)    Total Output 1270     Net -257.4            PHYSICAL EXAMINATION: Gen: sedated, but easily wakes to voice HEENT: NG in place, ETT in place PULM: coarse breath sounds bilaterally; decreased in right base CV: s1s2 regular, no murmur AB: Soft, non tender Ext: 1+ symmetric non-pitting edema Neuro: moves extremities, follows commands  LABS:  Recent Labs Lab 07/17/12 0414  07/21/12 0430 07/21/12 1400 07/21/12 1443 07/22/12 0634 07/23/12 0435 07/23/12 0439  HGB 10.8*  < > 13.1  --   --  12.8* 12.4*  --   WBC 4.0  < > 7.8  --   --  7.1 8.4  --   PLT 127*  < >  177  --   --  217 202  --   NA 134*  < > 139  --   --  138  --  141  K 4.0  < > 4.1  --   --  3.9  --  3.7  CL 104  < > 104  --   --  100  --  106  CO2 25  < > 27  --   --  26  --  24  GLUCOSE 108*  < > 94  --   --  98  --  90  BUN 12  < > 16  --   --  23  --  26*  CREATININE 0.49*  < > 0.57  --   --  0.68  --  0.61  CALCIUM 8.0*  < > 9.2  --   --  9.1  --  8.9  MG 2.0  --   --   --   --   --   --   --   PHOS 2.7  --   --   --   --   --   --   --   APTT  --   --   --  38*  --   --   --   --   INR  --   --   --  1.33  --   --   --   --   PHART  --   --   --   --  7.445  --   --   --   PCO2ART  --   --   --   --  34.2*  --   --   --    PO2ART  --   --   --   --  85.0  --   --   --   < > = values in this interval not displayed.  Recent Labs Lab 07/22/12 1258 07/22/12 1653 07/22/12 2002 07/23/12 0035 07/23/12 0338  GLUCAP 88 107* 100* 92 90    CXR: Chest Portable 1 View To Assess Tube Placement And Rule-out Pneumothorax  07/22/2012  *RADIOLOGY REPORT*  Clinical Data: Tracheostomy placement.  PORTABLE CHEST - 1 VIEW  Comparison: 07/22/2012  Findings: Interval placement tracheostomy tube which projects over the mid trachea.  No pneumothorax.  Left subclavian central line and right internal jugular central line remain in place, unchanged. Right lower lobe airspace disease and possible small right effusion again noted, stable.  Heart is normal size.  Left lung is clear.  IMPRESSION: Tracheostomy tube placement.  No pneumothorax.  Otherwise no significant change.   Original Report Authenticated By: Charlett Nose, M.D.    Dg Chest Port 1 View  07/22/2012  *RADIOLOGY REPORT*  Clinical Data: Intubated.  Follow up of pneumonia.  PORTABLE CHEST - 1 VIEW  Comparison: 07/21/2012  Findings: Endotracheal tube unchanged.  Nasogastric tube extends beyond the  inferior aspect of the film.  Right IJ and left subclavian lines are appropriately positioned with tips at mid to low SVC.  Normal heart size.  Left costophrenic angle minimally excluded. Probable small right pleural effusion. No pneumothorax.  Improved left base aeration with minimal airspace disease remaining. Worsened right base air space disease.  IMPRESSION: Increased right lower lobe airspace disease. Recommend radiographic follow-up until clearing.  Nearly completely resolved left base air space disease.  Probable small right pleural effusion.   Original Report Authenticated By: Jeronimo Greaves, M.D.  Dg Chest Port 1 View  07/21/2012  *RADIOLOGY REPORT*  Clinical Data: Intubation  PORTABLE CHEST - 1 VIEW  Comparison: Same day  Findings: Endotracheal tube has its tip 4 cm above the  carina. Nasogastric tube enters the stomach.  Right internal jugular central line has its tip in the SVC above the right atrium.  There is mild patchy infiltrate in the left lower lobe.  There is right lower lobe infiltrate / collapse.  Left subclavian central line has its tip in the SVC just below the azygos level.  IMPRESSION: Endotracheal tube well positioned with its tip 4 cm above the carina.  Other lines and tubes unchanged and well positioned. Patchy pneumonia left lower lobe and persistent pneumonia/collapsed right lower lobe.   Original Report Authenticated By: Paulina Fusi, M.D.     ASSESSMENT / PLAN:  PULMONARY A: Acute respiratory failure due to Myasthenia Gravis, and Aspiration PNA, dramatic reintubated required 2/10, trach placed 2/11 P:   trached  Keep same MV Trach collar today to goal 4-6 hrs, will fatigue with MG currently, needs nocturnal vent F/u CXR, rt base Neg balance as goal See neuro  CARDIOVASCULAR A: LLE DVT P:  Heparin held for g-tube placement today ASA 81mg  tele  RENAL A:  Volume overload. Mild hypokalemia P:   Negative fluid balance as tolerated >> lasix repeat dose, reduce F/u renal fx K supp today  Error chem 2/11 1200 likely Dc foley  GASTROINTESTINAL A:  Dysphagia due to MG. Nutrition. P:   TF  ppi g-tube placement on hold as NGT was unble to pass, will place ngt attempt again  Combination bulbar from MG and trach = trach Assess last BM  HEMATOLOGIC A: Anemia of critical illness. Thrombocytopenia >> resolved P:  Heparin back on pending g-tube placement Cbc in am  IR will have to hold this pre procedure  INFECTIOUS A:  Aspiration PNA. Completed Abx 2/04. P:   Monitor clinically Will ask for BAL rll, rml with trach Low threshold to start ABX if spike Dc line if able Dc hd cath after last plasmaxchange  ENDOCRINE A:  Hyperglycemia. P:   cbgs q4 As npo, continue as NPo still  NEUROLOGIC A: Myasthenia Gravis Anxiety P:    S/p IVIG mestinon  plasma exchange per neurology, likely needs total of 5 doses, ordered, for one more Trach in place Plan for g-tube Propofol dc  PT/OT consult  Dad participated in rounds all questions answered- requested psych consult  Family contact:  Nickalus Lamontagne [name] [phone]; back up contact for father: [name]: [phone] Consented father, all risks, explained, death, bleeding, ptx, infection  Resolved problems >> cardiac arrest 2nd to respiratory failure  BOOTH, ERIN Family Medicine, PGY-2 07/23/2012, 7:48 AM  I have fully examined this patient and agree with above findings.    And edited in full  Ccm time 30 min   Mcarthur Rossetti. Tyson Alias, MD, FACP Pgr: [phone] [facility]

## 2012-07-24 ENCOUNTER — Inpatient Hospital Stay (HOSPITAL_COMMUNITY): Payer: Medicaid Other

## 2012-07-24 LAB — CBC
HCT: 36 % — ABNORMAL LOW (ref 39.0–52.0)
MCHC: 33.3 g/dL (ref 30.0–36.0)
Platelets: 232 10*3/uL (ref 150–400)
RDW: 14.7 % (ref 11.5–15.5)
WBC: 8.5 10*3/uL (ref 4.0–10.5)

## 2012-07-24 LAB — BASIC METABOLIC PANEL
BUN: 26 mg/dL — ABNORMAL HIGH (ref 6–23)
Chloride: 102 mEq/L (ref 96–112)
Glucose, Bld: 80 mg/dL (ref 70–99)
Potassium: 3.6 mEq/L (ref 3.5–5.1)

## 2012-07-24 LAB — GLUCOSE, CAPILLARY: Glucose-Capillary: 80 mg/dL (ref 70–99)

## 2012-07-24 LAB — CREATININE, SERUM
GFR calc Af Amer: >90 mL/min (ref >90)
GFR calc non Af Amer: >90 mL/min (ref >90)

## 2012-07-24 MED ORDER — IOHEXOL 300 MG/ML  SOLN
50.0000 mL | Freq: Once | INTRAMUSCULAR | Status: AC | PRN
  Administered 2012-07-24: 20 mL

## 2012-07-24 MED ORDER — HEPARIN (PORCINE) IN NACL 100-0.45 UNIT/ML-% IJ SOLN
1750.0000 [IU]/h | INTRAMUSCULAR | Status: DC
  Administered 2012-07-24 – 2012-07-25 (×2): 1750 [IU]/h via INTRAVENOUS
  Filled 2012-07-24 (×4): qty 250

## 2012-07-24 MED ORDER — CALCIUM CARBONATE ANTACID 500 MG PO CHEW
2.0000 | CHEWABLE_TABLET | ORAL | Status: DC
  Filled 2012-07-24 (×2): qty 2

## 2012-07-24 MED ORDER — ACETAMINOPHEN 325 MG PO TABS: 650.0000 mg | ORAL_TABLET | ORAL | Status: DC | PRN

## 2012-07-24 MED ORDER — GLUCAGON HCL (RDNA) 1 MG IJ SOLR
INTRAMUSCULAR | Status: DC | PRN
  Administered 2012-07-24: .5 mg via INTRAVENOUS

## 2012-07-24 MED ORDER — MIDAZOLAM HCL 2 MG/2ML IJ SOLN
INTRAMUSCULAR | Status: DC | PRN
  Administered 2012-07-24 (×2): 1 mg via INTRAVENOUS

## 2012-07-24 MED ORDER — GLUCAGON HCL (RDNA) 1 MG IJ SOLR
INTRAMUSCULAR | Status: AC
  Filled 2012-07-24: qty 1

## 2012-07-24 MED ORDER — SODIUM CHLORIDE 0.9 % IV SOLN
INTRAVENOUS | Status: AC
  Administered 2012-07-25 (×4): via INTRAVENOUS_CENTRAL
  Filled 2012-07-24 (×4): qty 200

## 2012-07-24 MED ORDER — FENTANYL CITRATE 0.05 MG/ML IJ SOLN
INTRAMUSCULAR | Status: DC | PRN
  Administered 2012-07-24 (×6): 25 ug via INTRAVENOUS

## 2012-07-24 MED ORDER — HEPARIN SODIUM (PORCINE) 1000 UNIT/ML IJ SOLN
1000.0000 [IU] | Freq: Once | INTRAMUSCULAR | Status: AC
  Administered 2012-07-25: 1000 [IU]
  Filled 2012-07-24: qty 1

## 2012-07-24 MED ORDER — HEPARIN SODIUM (PORCINE) 1000 UNIT/ML IJ SOLN
1000.0000 [IU] | Freq: Once | INTRAMUSCULAR | Status: DC
  Filled 2012-07-24: qty 1

## 2012-07-24 MED ORDER — CALCIUM CARBONATE ANTACID 500 MG PO CHEW
2.0000 | CHEWABLE_TABLET | ORAL | Status: AC
  Filled 2012-07-24 (×2): qty 2

## 2012-07-24 MED ORDER — HYDROMORPHONE HCL PF 1 MG/ML IJ SOLN
1.0000 mg | INTRAMUSCULAR | Status: DC | PRN
  Administered 2012-07-24 – 2012-07-26 (×4): 1 mg via INTRAVENOUS
  Filled 2012-07-24 (×4): qty 1

## 2012-07-24 NOTE — Procedures (Signed)
Successful placement of 20 French gastrostomy.   No immediate complication.

## 2012-07-24 NOTE — Progress Notes (Signed)
Pt back from IR and placed back on vent on full support to allow him to rest.  RT will attempt trach collar at a later time.

## 2012-07-24 NOTE — Progress Notes (Signed)
Subjective: Today is last scheduled plasmaphoresis.  Remains trached.  No complaints.    Objective: Current vital signs: BP 106/89  Pulse 67  Temp(Src) 98.5 F (36.9 C) (Oral)  Resp 21  Ht 6' (1.829 m)  Wt 76.8 kg (169 lb 5 oz)  BMI 22.96 kg/m2  SpO2 100% Vital signs in last 24 hours: Temp:  [97.2 F (36.2 C)-99.4 F (37.4 C)] 98.5 F (36.9 C) (02/13 1607) Pulse Rate:  [55-83] 67 (02/13 1607) Resp:  [16-24] 21 (02/13 1607) BP: (92-120)/(47-89) 106/89 mmHg (02/13 1607) SpO2:  [94 %-100 %] 100 % (02/13 1607) FiO2 (%):  [39.9 %-100 %] 40 % (02/13 1607) Weight:  [76.8 kg (169 lb 5 oz)] 76.8 kg (169 lb 5 oz) (02/13 0300)  Intake/Output from previous day: 02/12 0701 - 02/13 0700 In: 799 [I.V.:749; IV Piggyback:50] Out: 1750 [Urine:1750] Intake/Output this shift: Total I/O In: 201 [I.V.:201] Out: 450 [Urine:450] Nutritional status: NPO  Neurologic Exam: Mental Status:  Alert. Able to follow 3 step commands without difficulty.  Cranial Nerves:  II: Discs flat bilaterally; Visual fields grossly normal, pupils equal, round, reactive to light and accommodation  III,IV, VI: Left ptosis minimal today and improved from yesterday's evaluation.  Decreased right downward gaze  V,VII: smile symmetric, facial light touch sensation normal bilaterally  VIII: hearing normal bilaterally  IX,X: gag reflex not tested  XI: bilateral shoulder shrug  XII: midline tongue extension  Motor:  Right : Upper extremity 5/5         Left: Upper extremity 5/5   Lower extremity 5/5      Lower extremity 5/5  Tone and bulk:normal tone throughout; no atrophy noted  Sensory: Pinprick and light touch intact throughout, bilaterally  Deep Tendon Reflexes: 2+ and symmetric throughout  Plantars:  Right: downgoing    Left: downgoing      Lab Results: Basic Metabolic Panel:  Recent Labs Lab 07/20/12 0428  07/21/12 0430 07/22/12 0634 07/22/12 1200 07/23/12 0439 07/24/12 0420 07/24/12 0820  NA 135   < > 139 138 140 141  --  140  K 3.8  < > 4.1 3.9 3.6 3.7  --  3.6  CL 100  < > 104 100 101 106  --  102  CO2 26  --  27 26  --  24  --  23  GLUCOSE 103*  < > 94 98 97 90  --  80  BUN 16  < > 16 23 24* 26*  --  26*  CREATININE 0.53  < > 0.57 0.68 1.00 0.61 0.51 0.53  CALCIUM 8.7  --  9.2 9.1  --  8.9  --  9.1  < > = values in this interval not displayed.  Liver Function Tests: No results found for this basename: AST, ALT, ALKPHOS, BILITOT, PROT, ALBUMIN,  in the last 168 hours No results found for this basename: LIPASE, AMYLASE,  in the last 168 hours No results found for this basename: AMMONIA,  in the last 168 hours  CBC:  Recent Labs Lab 07/20/12 0428  07/21/12 0430 07/22/12 0634 07/22/12 1200 07/23/12 0435 07/24/12 0420  WBC 4.9  --  7.8 7.1  --  8.4 8.5  NEUTROABS  --   --   --  4.7  --   --   --   HGB 11.7*  < > 13.1 12.8* 13.9 12.4* 12.0*  HCT 34.6*  < > 39.7 38.2* 41.0 37.9* 36.0*  MCV 89.2  --  90.4 90.1  --  90.9 90.7  PLT 166  --  177 217  --  202 232  < > = values in this interval not displayed.  Cardiac Enzymes: No results found for this basename: CKTOTAL, CKMB, CKMBINDEX, TROPONINI,  in the last 168 hours  Lipid Panel: No results found for this basename: CHOL, TRIG, HDL, CHOLHDL, VLDL, LDLCALC,  in the last 168 hours  CBG:  Recent Labs Lab 07/23/12 1517 07/24/12 0022 07/24/12 0413 07/24/12 0832 07/24/12 1241  GLUCAP 86 81 88 80 87    Microbiology: Results for orders placed during the hospital encounter of 07/10/12  CULTURE, BLOOD (ROUTINE X 2)     Status: None   Collection Time    07/10/12 11:42 PM      Result Value Range Status   Specimen Description BLOOD LH   Final   Special Requests     Final   Value: NONE BOTTLES DRAWN AEROBIC AND ANAEROBIC 2CC] BOTTLES DRAWN AEROBIC ONLY 2CC   Culture  Setup Time 07/11/2012 09:10   Final   Culture NO GROWTH 5 DAYS   Final   Report Status 07/17/2012 FINAL   Final  CULTURE, BLOOD (ROUTINE X 2)      Status: None   Collection Time    07/10/12 11:47 PM      Result Value Range Status   Specimen Description BLOOD LW   Final   Special Requests NONE BOTTLES DRAWN AEROBIC AND ANAEROBIC 3CC   Final   Culture  Setup Time 07/11/2012 09:10   Final   Culture NO GROWTH 5 DAYS   Final   Report Status 07/17/2012 FINAL   Final  MRSA PCR SCREENING     Status: None   Collection Time    07/11/12  3:35 AM      Result Value Range Status   MRSA by PCR NEGATIVE  NEGATIVE Final   Comment:            The GeneXpert MRSA Assay (FDA     approved for NASAL specimens     only), is one component of a     comprehensive MRSA colonization     surveillance program. It is not     intended to diagnose MRSA     infection nor to guide or     monitor treatment for     MRSA infections.  URINE CULTURE     Status: None   Collection Time    07/11/12  6:09 AM      Result Value Range Status   Specimen Description URINE, CATHETERIZED   Final   Special Requests NONE   Final   Culture  Setup Time 07/11/2012 09:18   Final   Colony Count NO GROWTH   Final   Culture NO GROWTH   Final   Report Status 07/12/2012 FINAL   Final  CULTURE, BLOOD (ROUTINE X 2)     Status: None   Collection Time    07/12/12  1:50 AM      Result Value Range Status   Specimen Description BLOOD LEFT HAND   Final   Special Requests BOTTLES DRAWN AEROBIC AND ANAEROBIC 2.5ML   Final   Culture  Setup Time 07/12/2012 16:20   Final   Culture NO GROWTH 5 DAYS   Final   Report Status 07/18/2012 FINAL   Final  CULTURE, BLOOD (ROUTINE X 2)     Status: None   Collection Time    07/12/12  2:00 AM      Result  Value Range Status   Specimen Description BLOOD LEFT ARM   Final   Special Requests BOTTLES DRAWN AEROBIC AND ANAEROBIC   Final   Culture  Setup Time 07/12/2012 16:20   Final   Culture NO GROWTH 5 DAYS   Final   Report Status 07/18/2012 FINAL   Final  CULTURE, RESPIRATORY     Status: None   Collection Time    07/13/12  5:57 AM      Result  Value Range Status   Specimen Description TRACHEAL ASPIRATE   Final   Special Requests NONE   Final   Gram Stain     Final   Value: FEW WBC PRESENT, PREDOMINANTLY PMN     RARE SQUAMOUS EPITHELIAL CELLS PRESENT     NO ORGANISMS SEEN   Culture RARE CANDIDA ALBICANS   Final   Report Status 07/15/2012 FINAL   Final  MRSA PCR SCREENING     Status: None   Collection Time    07/16/12  3:54 PM      Result Value Range Status   MRSA by PCR NEGATIVE  NEGATIVE Final   Comment:            The GeneXpert MRSA Assay (FDA     approved for NASAL specimens     only), is one component of a     comprehensive MRSA colonization     surveillance program. It is not     intended to diagnose MRSA     infection nor to guide or     monitor treatment for     MRSA infections.  URINE CULTURE     Status: None   Collection Time    07/16/12  3:54 PM      Result Value Range Status   Specimen Description URINE, CATHETERIZED   Final   Special Requests NONE   Final   Culture  Setup Time 07/16/2012 16:54   Final   Colony Count NO GROWTH   Final   Culture NO GROWTH   Final   Report Status 07/17/2012 FINAL   Final  CULTURE, BAL-QUANTITATIVE     Status: None   Collection Time    07/18/12 12:41 PM      Result Value Range Status   Specimen Description BRONCHIAL ALVEOLAR LAVAGE   Final   Special Requests RLL   Final   Gram Stain     Final   Value: RARE WBC PRESENT, PREDOMINANTLY PMN     RARE SQUAMOUS EPITHELIAL CELLS PRESENT     NO ORGANISMS SEEN   Colony Count 5,000 COLONIES/ML   Final   Culture Non-Pathogenic Oropharyngeal-type Flora Isolated.   Final   Report Status 07/20/2012 FINAL   Final  FUNGUS CULTURE W SMEAR     Status: None   Collection Time    07/21/12  5:48 PM      Result Value Range Status   Specimen Description TRACHEAL ASPIRATE   Final   Special Requests NONE   Final   Fungal Smear NO YEAST OR FUNGAL ELEMENTS SEEN   Final   Culture CULTURE IN PROGRESS FOR FOUR WEEKS   Final   Report Status  PENDING   Incomplete  AFB CULTURE WITH SMEAR     Status: None   Collection Time    07/21/12  5:49 PM      Result Value Range Status   Specimen Description TRACHEAL ASPIRATE   Final   Special Requests NONE   Final   ACID FAST  SMEAR NO ACID FAST BACILLI SEEN   Final   Culture     Final   Value: CULTURE WILL BE EXAMINED FOR 6 WEEKS BEFORE ISSUING A FINAL REPORT   Report Status PENDING   Incomplete  CULTURE, BAL-QUANTITATIVE     Status: None   Collection Time    07/22/12 10:50 AM      Result Value Range Status   Specimen Description BRONCHIAL ALVEOLAR LAVAGE   Final   Special Requests NONE   Final   Gram Stain     Final   Value: RARE WBC PRESENT, PREDOMINANTLY PMN     RARE SQUAMOUS EPITHELIAL CELLS PRESENT     NO ORGANISMS SEEN   Colony Count PENDING   Incomplete   Culture Culture reincubated for better growth   Final   Report Status PENDING   Incomplete    Coagulation Studies: No results found for this basename: LABPROT, INR,  in the last 72 hours  Imaging: Ir Gastrostomy Tube Mod Sed  07/24/2012  *RADIOLOGY REPORT*  Clinical history:57 year old with myasthenia gravis and severe dysphagia.  PROCEDURE(S): PERCUTANEOUS GASTROSTOMY TUBE WITH FLUOROSCOPIC GUIDANCE  Physician: Rachelle Hora. Henn, MD  Medications:Versed 2.00mg , Fentanyl 125 mcg. A radiology nurse monitored the patient for moderate sedation.  Moderate sedation time:27 minutes  Fluoroscopy time: 4.4 minutes  Procedure:Informed consent was obtained for a percutaneous gastrostomy tube.  The patient was placed on the interventional table.  Fluoroscopy demonstrated oral contrast in the transverse colon.  An orogastric tube was placed with fluoroscopic guidance. The anterior abdomen was prepped and draped in sterile fashion. Maximal barrier sterile technique was utilized including caps, mask, sterile gowns, sterile gloves, sterile drape, hand hygiene and skin antiseptic.  Stomach was inflated with air through the orogastric tube.  The skin  and subcutaneous tissues were anesthetized with 1% lidocaine.  A 17 gauge needle was directed into the distended stomach with fluoroscopic guidance.  A wire was advanced into the stomach.  A 9-French vascular sheath was placed and the orogastric tube was snared using a Gooseneck snare device. The orogastric tube and snare were pulled out of the patient's mouth.  The snare device was connected to a 20-French gastrostomy tube.  The snare device and gastrostomy tube were pulled through the patient's mouth and out the anterior abdominal wall.  The gastrostomy tube was cut to an appropriate length.  Contrast injection through gastrostomy tube confirmed placement within the stomach.  Fluoroscopic images were obtained for documentation.  The gastrostomy tube was flushed with normal saline.  Findings:Gastrostomy tube within the stomach.  Complications: None  Impression:Successful fluoroscopic guided percutaneous gastrostomy tube placement.   Original Report Authenticated By: Richarda Overlie, M.D.    Dg Chest Port 1 View  07/24/2012  *RADIOLOGY REPORT*  Clinical Data: Shortness of breath  PORTABLE CHEST - 1 VIEW  Comparison: 07/22/2012  Findings: Stable tracheostomy.  Stable right IJ venous catheter.  Interval placement of an enteric tube which courses below the diaphragm.  Small layering right pleural effusion. Associated right lower lobe opacity, atelectasis versus pneumonia.  Left basilar atelectasis  IMPRESSION: Stable tracheostomy.  Small layering right pleural effusion.  Associated right lower lobe opacity, atelectasis versus pneumonia.   Original Report Authenticated By: Charline Bills, M.D.    Dg Abd Portable 1v  07/24/2012  *RADIOLOGY REPORT*  Clinical Data: Pre gastrostomy, check barium  PORTABLE ABDOMEN - 1 VIEW  Comparison: 07/23/2012  Findings: Enteric tube terminates in the antral pyloric region.  Dense contrast in the ascending colon.  Additional residual contrast in the transverse and proximal descending  colon.  IMPRESSION: Contrast predominately in the right colon, as described above.   Original Report Authenticated By: Charline Bills, M.D.    Dg Abd Portable 1v  07/23/2012  *RADIOLOGY REPORT*  Clinical Data: Nasogastric tube placement  PORTABLE ABDOMEN - 1 VIEW  Comparison: Portable exam 1158 hours without priors for comparison  Findings: Nasogastric tube tip projects over right upper quadrant at expected position of the pylorus or duodenal bulb. Bowel gas pattern normal. No pathologic calcifications or acute osseous findings.  IMPRESSION: Tip of nasogastric tube projects over the right upper quadrant over the expected position of the pylorus or duodenal bulb.   Original Report Authenticated By: Ulyses Southward, M.D.     Medications:  I have reviewed the patient's current medications. Scheduled: . albuterol  2.5 mg Nebulization Q8H  . antiseptic oral rinse  15 mL Mouth Rinse QID  . aspirin  81 mg Oral Daily  . calcium carbonate  2 tablet Oral Q3H  . chlorhexidine  15 mL Mouth Rinse BID  . glucagon      . heparin  1,000 Units Intracatheter Once  . heparin  1,000 Units Intracatheter Once  . heparin  1,000 Units Intracatheter Once  . heparin  1,000 Units Intracatheter Once  . heparin lock flush  500 Units Intravenous Once  . pantoprazole sodium  40 mg Per Tube Daily  . pyridostigmine  39.6 mg Oral Q4H  . sodium chloride  10-40 mL Intracatheter Q12H    Assessment/Plan: Some improvement noted on evaluation today.  Will receive 5/5 plasmaphoresis treatment today.    Recommendation: 1.  Will re-evaluate for necessity of further plasmaphoresis treatments.   2.  Continue Mestinon at current dose    LOS: 14 days   Thana Farr, MD [facility] [phone] 07/24/2012  5:14 PM

## 2012-07-24 NOTE — Progress Notes (Signed)
PULMONARY  / CRITICAL CARE MEDICINE  Name: Victor Durham MRN: [id number] DOB: 1955/11/11    ADMISSION DATE:  07/10/2012 CONSULTATION DATE:  1/30/20143  REFERRING MD :  Juleen China  CHIEF COMPLAINT:  Dysphagia  BRIEF PATIENT DESCRIPTION: 57 y/o homeless male presented to the [facility] ED On 1/30 for the third time in two weeks for evaluation of dysphagia.  He complained of some difficulty breathing and underwent a neck CT.  Shortly after that procedure he developed respiratory and cardiac arrest.  PCCM called for admission.  SIGNIFICANT EVENTS / STUDIES:  1/30 respiratory and cardiac arrest in ED 1/30 CT head: hypoattenuation R midbrain extending into L pons, poorly characterized due to streak artifact 130 Echocardiogram: LVEF normal. Mild to moderate RV dilatation 1/31 EGD gastritis and esophagitis but no mass or bleeding source 1/31 LE venous duplex scans: LLE DVT 1/31 Thick secretions -plugged ETT during MRI 1/31 MRI -Scattered subcortical T2 hyperintensities -nonspecific, chronic microvascular ischemia vs a demyelinating process  2/1 Neurology consult: concern for myasthenia gravis 2/1 CT chest - no thymoma or central PE, Bilateral lower lobe consolidation and atelectasis , Nonspecific enhancing solid 4.1 cm mass exophytic from segment 2 of the left hepatic lobe  2/2 Mestinon started - intolerant due to excessive secretions 2/3 IVIG initiated by Neuro 2/4 Acetylcholine Receptor Ab 127.00 (<=0.30 mmol/L) 2/4 MRI cervical spine >> normal 2/4 Mestinon retried at a decreased dose 2-4 tx to cone. 2-7 Bronchoscopy 2/9- extubated 2/10 re intubated 2/11 trach 2/13- peg  LINES / TUBES: 1/30 ETT >> 2/9 1/30 L  CVL >> 2/5  RtI J HD Cath insertion>>   CULTURES: 1/30 blood >> NEG 1/30 urine >> NEG 2-2 sputum>> NEG 2-1 bc x 2 >> NEG 2-7 BAL >> oral flora 2-11 BAL >>  ANTIBIOTICS: unasyn 1/30 >> 2/4 levaquin 1/30 >> 2/4  SUBJECTIVE:  Going for G-tube placement this morning.  Last  plasmapheresis today.  No complaints today.  VITAL SIGNS: Temp:  [98 F (36.7 C)-98.8 F (37.1 C)] 98.8 F (37.1 C) (02/13 0400) Pulse Rate:  [63-83] 67 (02/13 0700) Resp:  [16-24] 16 (02/13 0700) BP: (93-120)/(49-69) 105/59 mmHg (02/13 0700) SpO2:  [93 %-100 %] 99 % (02/13 0700) FiO2 (%):  [35 %-40.3 %] 40 % (02/13 0700) Weight:  [169 lb 5 oz (76.8 kg)] 169 lb 5 oz (76.8 kg) (02/13 0300) VENTILATOR SETTINGS: Vent Mode:  [-] PRVC FiO2 (%):  [35 %-40.3 %] 40 % Set Rate:  [18 bmp] 18 bmp Vt Set:  [550 mL] 550 mL PEEP:  [4.5 cmH20-5 cmH20] 5 cmH20 Plateau Pressure:  [14 cmH20-18 cmH20] 14 cmH20 INTAKE / OUTPUT: Intake/Output     02/12 0701 - 02/13 0700 02/13 0701 - 02/14 0700   I.V. (mL/kg) 749 (9.8)    IV Piggyback 50    Total Intake(mL/kg) 799 (10.4)    Urine (mL/kg/hr) 1750 (0.9)    Total Output 1750     Net -951            PHYSICAL EXAMINATION: Gen: tired but alert HEENT: NG in place, trach site clean PULM: coarse breath sounds bilaterally; decreased in right base CV: s1s2 regular, no murmur AB: Soft, non tender Ext: trace symmetric non-pitting edema Neuro: moves extremities, follows commands  LABS:  Recent Labs Lab 07/21/12 0430 07/21/12 1400 07/21/12 1443 07/22/12 0634 07/22/12 1200 07/23/12 0435 07/23/12 0439 07/24/12 0420  HGB 13.1  --   --  12.8* 13.9 12.4*  --  12.0*  WBC 7.8  --   --  7.1  --  8.4  --  8.5  PLT 177  --   --  217  --  202  --  232  NA 139  --   --  138 140  --  141  --   K 4.1  --   --  3.9 3.6  --  3.7  --   CL 104  --   --  100 101  --  106  --   CO2 27  --   --  26  --   --  24  --   GLUCOSE 94  --   --  98 97  --  90  --   BUN 16  --   --  23 24*  --  26*  --   CREATININE 0.57  --   --  0.68 1.00  --  0.61 0.51  CALCIUM 9.2  --   --  9.1  --   --  8.9  --   APTT  --  38*  --   --   --   --   --   --   INR  --  1.33  --   --   --   --   --   --   PHART  --   --  7.445  --   --   --   --   --   PCO2ART  --   --  34.2*  --    --   --   --   --   PO2ART  --   --  85.0  --   --   --   --   --     Recent Labs Lab 07/23/12 0836 07/23/12 1212 07/23/12 1517 07/24/12 0022 07/24/12 0413  GLUCAP 90 90 86 81 88    CXR: Chest Portable 1 View To Assess Tube Placement And Rule-out Pneumothorax  07/22/2012  *RADIOLOGY REPORT*  Clinical Data: Tracheostomy placement.  PORTABLE CHEST - 1 VIEW  Comparison: 07/22/2012  Findings: Interval placement tracheostomy tube which projects over the mid trachea.  No pneumothorax.  Left subclavian central line and right internal jugular central line remain in place, unchanged. Right lower lobe airspace disease and possible small right effusion again noted, stable.  Heart is normal size.  Left lung is clear.  IMPRESSION: Tracheostomy tube placement.  No pneumothorax.  Otherwise no significant change.   Original Report Authenticated By: Charlett Nose, M.D.    Dg Abd Portable 1v  07/23/2012  *RADIOLOGY REPORT*  Clinical Data: Nasogastric tube placement  PORTABLE ABDOMEN - 1 VIEW  Comparison: Portable exam 1158 hours without priors for comparison  Findings: Nasogastric tube tip projects over right upper quadrant at expected position of the pylorus or duodenal bulb. Bowel gas pattern normal. No pathologic calcifications or acute osseous findings.  IMPRESSION: Tip of nasogastric tube projects over the right upper quadrant over the expected position of the pylorus or duodenal bulb.   Original Report Authenticated By: Ulyses Southward, M.D.     ASSESSMENT / PLAN:  PULMONARY A: Acute respiratory failure due to Myasthenia Gravis, and Aspiration PNA, dramatic reintubated required 2/10, trach placed 2/11 P:   Trach collar day prior fatigued at 6 hrs, re attempt, needs noctunral mandatory vent Neg balance as goal See neuro Attempt VC, NIF daily   CARDIOVASCULAR A: LLE DVT P:  Heparin held for g-tube placement today ASA 81mg  tele  RENAL A:  Volume overload. Mild hypokalemia  P:   Negative fluid  balance as tolerated >> lasix repeat dose, reduce F/u renal fx, K this morning K supp as needed  GASTROINTESTINAL A:  Dysphagia due to MG. Nutrition. P:   TF  ppi g-tube placement today, done Assess last BM  HEMATOLOGIC A: Anemia of critical illness. Thrombocytopenia >> resolved P:  Heparin back on after g-tube placement, will d/w IR timing Cbc in am   INFECTIOUS A:  Aspiration PNA. Completed Abx 2/04. P:   Monitor clinically f/u BAL rll, rml with trach Low threshold to start ABX if spike Dc line if able to get PIV Dc hd cath after last plasmaxchange  ENDOCRINE A:  Hyperglycemia. P:   cbgs q4 as NPO  NEUROLOGIC A: Myasthenia Gravis Anxiety P:   S/p IVIG mestinon  plasma exchange per neurology, likely needs total of 5 doses, last dose today Trach in place Plan for g-tube PT/OT consult Psych consult placed   Stay on pccm, to sdu vent bed Family contact:  Nayel Dannemiller [name] [phone]; back up contact for father: [name]: [phone] Consented father, all risks, explained, death, bleeding, ptx, infection  Resolved problems >> cardiac arrest 2nd to respiratory failure  BOOTH, ERIN Family Medicine, PGY-2 07/24/2012, 7:32 AM  I have fully examined this patient and agree with above findings.    And edited in full  Mcarthur Rossetti. Tyson Alias, MD, FACP Pgr: [phone] [facility]

## 2012-07-24 NOTE — Op Note (Signed)
NAME:  Victor Durham, Victor Durham NO.:  [id number]  MEDICAL RECORD NO.:  [id number]  LOCATION:                                 FACILITY:  PHYSICIAN:  Nelda Bucks, MD DATE OF BIRTH:  1956/03/16  DATE OF PROCEDURE:  07/22/2012 DATE OF DISCHARGE:                              OPERATIVE REPORT   OPERATION:  Percutaneous tracheostomy.  PREOPERATIVE DIAGNOSIS:  Myasthenia gravis crisis with recurrent collapse and pneumonia.  POSTOPERATIVE DIAGNOSIS:  Status post tracheostomy secondary to myasthenia gravis crisis and pneumonia.  Consent was obtained in full from the patient's father fully aware of risks and benefits of the procedure including death, pneumothorax, bleeding, and infection.  BRONCHOSCOPIST FOR THE PROCEDURE:  Dr. Jefm Miles.  DESCRIPTION OF PROCEDURE:  The patient was placed in supine position. Chlorhexidine preparation was used to sterilize the operative site.  The bronchoscope was placed and introduced through the endotracheal tube and backed up to approximately 17 cm.  After chlorhexidine preparation, I then injected 8 mL of lidocaine plus epinephrine over the second endotracheal space.  An 1.2 cm vertical incision was made.  Dissection was made to identify the strap muscles, notify the tracheal planes.  I then placed under direct visualization, 18-gauge needle over white catheter sheath into the airway successfully.  Through the white catheter sheath after needle removal, I placed a wire.  White catheter sheath was removed.  I then placed a 14-French punch dilator over the wire, in and out.  I then placed a progressive rhino dilator to 37- [place] over a glider, in and out of the airway.  Then, placed a size 8 tracheostomy over a 20-French dilator into the airway successfully without any posterior wall injury noted throughout the procedure.  The bronchoscopist placed the bronchoscope through the new tracheostomy tube and noted the carina  approximately 5 cm below without any posterior wall injury or complications noted.  Blood loss for the procedure was less than 1 mL.  Postoperative chest x-ray revealed a well placed tracheostomy without any apparent complications.     Nelda Bucks, MD     DJF/MEDQ  D:  07/23/2012  T:  07/23/2012  Job:  [id number]

## 2012-07-24 NOTE — Progress Notes (Signed)
07/24/12  Pharmacy-  Heparin 2245  Heparin level 0.33 on 1750 units/hr  A/P:  57yo male with DVT.  Heparin level therapeutic on current rate of heparin.  No bleeding problems noted.  Will continue current rate and f/u in AM.  Marisue Humble, PharmD Clinical Pharmacist [facility]

## 2012-07-24 NOTE — Progress Notes (Signed)
ANTICOAGULATION CONSULT NOTE - Follow Up Consult  Pharmacy Consult for Heparin Indication: DVT  No Known Allergies  Patient Measurements: Height: 6' (182.9 cm) Weight: 169 lb 5 oz (76.8 kg) IBW/kg (Calculated) : 77.6 Heparin Dosing Weight: 77kg  Vital Signs: Temp: 97.2 F (36.2 C) (02/13 1259) Temp src: Oral (02/13 1259) BP: 94/51 mmHg (02/13 1156) Pulse Rate: 56 (02/13 1156)  Labs:  Recent Labs  07/21/12 1400 07/21/12 2359  07/22/12 0634 07/22/12 1200 07/22/12 1945 07/23/12 0435 07/23/12 0439 07/23/12 1754 07/24/12 0420 07/24/12 0820  HGB  --   --   < > 12.8* 13.9  --  12.4*  --   --  12.0*  --   HCT  --   --   < > 38.2* 41.0  --  37.9*  --   --  36.0*  --   PLT  --   --   --  217  --   --  202  --   --  232  --   APTT 38*  --   --   --   --   --   --   --   --   --   --   LABPROT 16.2*  --   --   --   --   --   --   --   --   --   --   INR 1.33  --   --   --   --   --   --   --   --   --   --   HEPARINUNFRC 0.45 0.46  --   --   --  0.18*  --   --  0.44  --   --   CREATININE  --   --   < > 0.68 1.00  --   --  0.61  --  0.51 0.53  < > = values in this interval not displayed.  Estimated Creatinine Clearance: 112 ml/min (by C-G formula based on Cr of 0.53).  Assessment: 56yom on heparin for LLE DVT (07/11/12). Heparin was turned off this morning for G-tube placement. He is now s/p G-tube placement and heparin to resume ~ 6 hours after the procedure (verbal order from Dr. Lowella Dandy). He was previously therapeutic on 1750 unit/hr so will resume at this rate.  Hemoglobin slowly trending down, platelets stable. No bleeding reported.  Goal of Therapy:  Heparin level 0.3-0.7 units/ml Monitor platelets by anticoagulation protocol: Yes   Plan:  1) At 1600 today, resume heparin at 1750 units/hr (no bolus) 2) 6 hour heparin level after restart  Fredrik Rigger 07/24/2012,1:51 PM

## 2012-07-24 NOTE — Evaluation (Signed)
Physical Therapy Evaluation Patient Details Name: KOBEE MEDLEN MRN: [id number] DOB: [birth year] Today's Date: 07/24/2012 Time: 4098-1191 PT Time Calculation (min): 23 min  PT Assessment / Plan / Recommendation Clinical Impression  Pt s/p Acute respiratory failure with VDRF, now with trach and PEG and myasthenia gravis.  Will benefit from PT to address mobility, endurance and balance issues.  Will probably need NHP at d/c.      PT Assessment  Patient needs continued PT services    Follow Up Recommendations  SNF;Supervision/Assistance - 24 hour        Barriers to Discharge Decreased caregiver support      Equipment Recommendations  Other (comment) (TBA)         Frequency Min 3X/week    Precautions / Restrictions Precautions Precautions: Fall Restrictions Weight Bearing Restrictions: No   Pertinent Vitals/Pain VSS, No pain      Mobility  Bed Mobility Bed Mobility: Rolling Right;Right Sidelying to Sit;Sitting - Scoot to [place] of Bed Rolling Right: 1: +2 Total assist Rolling Right: Patient Percentage: 50% Right Sidelying to Sit: 1: +2 Total assist;With rails;HOB elevated Right Sidelying to Sit: Patient Percentage: 50% Sitting - Scoot to Edge of Bed: 3: Mod assist Details for Bed Mobility Assistance: cues and assist for technique, pt needed assist to elevate trunk as well.   Transfers Transfers: Sit to Stand;Stand to Sit;Stand Pivot Transfers Sit to Stand: 1: +2 Total assist;From elevated surface;With upper extremity assist;From bed Sit to Stand: Patient Percentage: 60% Stand to Sit: 1: +2 Total assist;With upper extremity assist;With armrests;To chair/3-in-1 Stand to Sit: Patient Percentage: 60% Stand Pivot Transfers: 1: +2 Total assist Stand Pivot Transfers: Patient Percentage: 60% Details for Transfer Assistance: Pt needed cues for hand placement.  Pt initiated sit to stand well and once half way up began to buckle slightly needing UE support and assist for knee  stability.  PT and tech able to stabilize pt and pt turned and pivoted with assist for knee support as well as UE support as well as weight shifing assist.  Needed assist to control descent into chair as well.   Ambulation/Gait Ambulation/Gait Assistance: Not tested (comment) Stairs: No Wheelchair Mobility Wheelchair Mobility: No    Exercises General Exercises - Lower Extremity Ankle Circles/Pumps: AROM;Both;10 reps;Supine Heel Slides: AAROM;Both;10 reps;Supine   PT Diagnosis: Generalized weakness  PT Problem List: Decreased activity tolerance;Decreased balance;Decreased mobility;Decreased cognition;Decreased knowledge of precautions;Decreased knowledge of use of DME;Decreased safety awareness;Decreased strength PT Treatment Interventions: DME instruction;Gait training;Functional mobility training;Therapeutic activities;Therapeutic exercise;Balance training;Patient/family education   PT Goals Acute Rehab PT Goals PT Goal Formulation: With patient Time For Goal Achievement: 08/07/12 Potential to Achieve Goals: Good Pt will go Supine/Side to Sit: with supervision PT Goal: Supine/Side to Sit - Progress: Goal set today Pt will Sit at Edge of Bed: with supervision;3-5 min;with bilateral upper extremity support PT Goal: Sit at Edge Of Bed - Progress: Goal set today Pt will go Sit to Stand: with supervision;with upper extremity assist PT Goal: Sit to Stand - Progress: Goal set today Pt will Transfer Bed to Chair/Chair to Bed: with min assist PT Transfer Goal: Bed to Chair/Chair to Bed - Progress: Goal set today Pt will Ambulate: 16 - 50 feet;with min assist;with least restrictive assistive device PT Goal: Ambulate - Progress: Goal set today Pt will Perform Home Exercise Program: with supervision, verbal cues required/provided PT Goal: Perform Home Exercise Program - Progress: Goal set today  Visit Information  Last PT Received On: 07/24/12 Assistance Needed: +2  Subjective Data   Subjective: Pt with trached. Mouthing words and writing at times. 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 Prior Function Level of Independence: Independent Able to Take Stairs?: Yes Driving: Yes Comments: ? if employed before Communication Communication: No difficulties    Cognition  Cognition Overall Cognitive Status: Impaired Area of Impairment: Attention;Memory;Following commands;Safety/judgement;Awareness of deficits;Problem solving;Executive functioning;Other (comment) (bipolar and nursing reports mental issues) Arousal/Alertness: Awake/alert Orientation Level: Disoriented to;Time;Situation;Place Behavior During Session: [facility] for tasks performed Current Attention Level: Focused Memory Deficits: Did not seem to remember his prior situation or did not want to share it. Following Commands: Follows one step commands consistently;Follows one step commands with increased time Safety/Judgement: Decreased awareness of safety precautions;Decreased safety judgement for tasks assessed;Decreased awareness of need for assistance    Extremity/Trunk Assessment Right Lower Extremity Assessment RLE ROM/Strength/Tone: Deficits RLE ROM/Strength/Tone Deficits: grossly 3/5 Left Lower Extremity Assessment LLE ROM/Strength/Tone: Deficits LLE ROM/Strength/Tone Deficits: grossly 3/5   Balance Balance Balance Assessed: Yes Dynamic Sitting Balance Dynamic Sitting - Balance Support: Bilateral upper extremity supported;Feet supported Dynamic Sitting - Level of Assistance: 4: Min  assist Dynamic Sitting Balance - Compensations: 2 minutes needing assist for steadying with dynamic activities at EOB for stability.   Dynamic Sitting - Balance Activities: Lateral lean/weight shifting;Forward lean/weight shifting;Reaching across midline Static Standing Balance Static Standing - Balance Support: Bilateral upper extremity supported;During functional activity Static Standing - Level of Assistance: 1: +2 Total assist;Other (comment) (pt=60%) Static Standing - Comment/# of Minutes: Pt needs UE support as well as support for knee instability with standing activity.   End of Session PT - End of Session Equipment Utilized During Treatment: Gait belt;Oxygen Activity Tolerance: Patient limited by fatigue Patient left: in chair;with call bell/phone within reach Nurse Communication: Mobility status;Need for lift equipment       INGOLD,Londynn Sonoda 07/24/2012, 12:09 PM  [facility] Acute Rehabilitation [phone] (pager)

## 2012-07-24 NOTE — Progress Notes (Signed)
SLP Cancellation Note  Patient Details Name: Victor Durham MRN: [id number] DOB: 07/15/1955   Cancelled treatment:       Reason Eval/Treat Not Completed: Patient not medically ready;Patient at procedure or test/unavailable. Pt going for PEG placement, still on vent. Not yet ready for PMSV evaluation. Will f/u this pm if possible.   Harlon Ditty, [place] CCC-SLP [phone]  Claudine Mouton 07/24/2012, 9:32 AM

## 2012-07-25 DIAGNOSIS — F259 Schizoaffective disorder, unspecified: Secondary | ICD-10-CM

## 2012-07-25 DIAGNOSIS — F22 Delusional disorders: Secondary | ICD-10-CM

## 2012-07-25 LAB — CULTURE, BAL-QUANTITATIVE W GRAM STAIN: Colony Count: 30000

## 2012-07-25 LAB — CBC
HCT: 34.9 % — ABNORMAL LOW (ref 39.0–52.0)
Hemoglobin: 11.8 g/dL — ABNORMAL LOW (ref 13.0–17.0)
WBC: 8.4 10*3/uL (ref 4.0–10.5)

## 2012-07-25 MED ORDER — ALBUTEROL SULFATE (5 MG/ML) 0.5% IN NEBU
2.5000 mg | INHALATION_SOLUTION | Freq: Three times a day (TID) | RESPIRATORY_TRACT | Status: DC
  Administered 2012-07-25 – 2012-08-06 (×36): 2.5 mg via RESPIRATORY_TRACT
  Filled 2012-07-25 (×36): qty 0.5

## 2012-07-25 MED ORDER — FUROSEMIDE 10 MG/ML IJ SOLN
80.0000 mg | Freq: Once | INTRAMUSCULAR | Status: AC
  Administered 2012-07-25: 80 mg via INTRAVENOUS
  Filled 2012-07-25: qty 8

## 2012-07-25 MED ORDER — ACD FORMULA A 0.73-2.45-2.2 GM/100ML VI SOLN
Status: AC
  Filled 2012-07-25: qty 500

## 2012-07-25 MED ORDER — HEPARIN (PORCINE) IN NACL 100-0.45 UNIT/ML-% IJ SOLN
2250.0000 [IU]/h | INTRAMUSCULAR | Status: DC
  Administered 2012-07-26 (×2): 2100 [IU]/h via INTRAVENOUS
  Administered 2012-07-26: 2250 [IU]/h via INTRAVENOUS
  Filled 2012-07-25 (×4): qty 250

## 2012-07-25 NOTE — Progress Notes (Signed)
Clinical Social Work Department CLINICAL SOCIAL WORK PSYCHIATRY SERVICE LINE ASSESSMENT 07/25/2012  Patient:  Victor Durham  Account:  [id number]  Admit Date:  07/10/2012  Clinical Social Worker:  Unk Lightning, LCSW  Date/Time:  07/25/2012 11:30 AM Referred by:  Physician  Date referred:  07/25/2012 Reason for Referral  Behavioral Health Issues   Presenting Symptoms/Problems (In the person's/family's own words):   Psych consulted to complete psychosocial assessment   Abuse/Neglect/Trauma History (check all that apply)  Denies history   Abuse/Neglect/Trauma Comments:   Psychiatric History (check all that apply)  Denies history   Psychiatric medications:  None   Current Mental Health Hospitalizations/Previous Mental Health History:   Patient denies any MH history   Current provider:   N/A   Place and Date:   N/A   Current Medications:   sodium chloride, acetaminophen, acetaminophen, ALPRAZolam, calcium carbonate, calcium gluconate IVPB, calcium gluconate IVPB, calcium gluconate, diphenhydrAMINE, fentaNYL, glucagon, HYDROmorphone (DILAUDID) injection, lactulose, midazolam, phenol, sodium chloride            . albuterol  2.5 mg Nebulization Q8H  . antiseptic oral rinse  15 mL Mouth Rinse QID  . aspirin  81 mg Oral Daily  . chlorhexidine  15 mL Mouth Rinse BID  . citrate dextrose      . heparin  1,000 Units Intracatheter Once  . heparin  1,000 Units Intracatheter Once  . heparin  1,000 Units Intracatheter Once  . heparin  1,000 Units Intracatheter Once  . heparin lock flush  500 Units Intravenous Once  . pantoprazole sodium  40 mg Per Tube Daily  . pyridostigmine  39.6 mg Oral Q4H  . sodium chloride  10-40 mL Intracatheter Q12H   Previous Impatient Admission/Date/Reason:   None reported   Emotional Health / Current Symptoms    Suicide/Self Harm  None reported   Suicide attempt in the past:   Patient denies SI or HI.   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 Patient told dad that [employer] were following him in unmarked cars to track him.    Attention/Behavioral Symptoms  Inattentive   Other Attention / Behavioral Symptoms:    Cognitive Impairment  Unable to accurately assess   Other Cognitive Impairment:    Mood and Adjustment  Flat    Stress, Anxiety, Trauma, Any Recent Loss/Stressor  Other - See comment   Anxiety (frequency):   Phobia (specify):   Compulsive behavior (specify):   Obsessive behavior (specify):   Other:   Patient homeless. Patient separated from wife. Patient has strained relationship with parents and siblings.   Substance Abuse/Use  None   SBIRT completed (please refer for detailed history):  N  Self-reported substance use:   Patient denies all substance use   Urinary Drug Screen Completed:  Y Alcohol level:   Negative screen    Environmental/Housing/Living Arrangement  HOMELESS   Who is in the home:   Patient reports he has money to stay at hotels and apartments.   Emergency contact:  Tom-dad   Financial  IPRS   Patient's Strengths and Goals (patient's own words):   Patient's father in now involved in care. Patient is resourceful and states he has money to stay where he wants to.   Clinical Social Worker's Interpretive Summary:   CSW received referral to complete psychosocial assessment.  CSW reviewed chart and met with patient at bedside.    CSW introduced myself and explained role. Patient has trach and unable to talk at the time. Patient is able to write notes on a pad to communicate with CSW. Patient reports that he is not homeless and has a large amount of money to stay where he wants to. Patient denies any MH or SA concerns. Patient denies any SI or HI or psychotic features. Patient  agreeable for CSW to contact dad.    CSW spoke with dad via phone. Dad reports that patient has "not been a normal person since the day he was born." Patient worked at [employer]  for about 23 years. One day patient reported he could no longer take the stress of working at [employer]  and quit. Patient's house got foreclosed on and patient and wife separated. Patient struggled with maintaining housing and wife recently filed for divorce. Patient would call dad and ask for money but dad stopped provided patient with money. Dad and patient had not spoken in several months prior to hospitalization. Patient also had strained relationship with siblings but siblings have been visiting patient in the hospital.    Dad reports that he feels patient needs psychiatric help. Dad told stories about patient calling a lawyer a liar and reporting that he heard people talking about him behind his back. Dad also states that patient feels the [employer] is following him. Patient did not make any of these statements and denied AH and VH. Dad feels that patient is homeless due to MH concerns and wants patient to follow up with psychiatrist. Dad attempted to get patient to see a psychiatrist a few years ago but patient denied. Dad confirms no SA but feels if patient could get stable housing and follow on outpatient basis that patient could be successful. CSW and dad discussed that it is patient's decision if he wants to follow up with psychiatrist.    Although dad voices concerns, patient was engaged throughout assessment and denied any MH concerns. Patient does not consider himself homeless and reports he has resources for any of his needs. Per unit CSW, patient waiting on placement at [facility].    CSW will staff case with psych MD and follow any recommendations provided.   Disposition:  Recommend Psych CSW continuing to support while in hospital

## 2012-07-25 NOTE — Progress Notes (Signed)
Pt HR bradys down to 48 non-sustaining. HR sustaining between 52-55 at this time. Pt receiving plasma exchange. BP as low as 87/52 and now 95/53. Pt resting and comfortable. Call placed to [facility] MD notified of vital signs.  No new orders at this time. Will continue to monitor.  M.Foster Simpson, RN

## 2012-07-25 NOTE — Progress Notes (Signed)
Subjective: Patient remains trached.  Per respiratory therapy ahs had less secretions.  Able to cough.  Last plasmaphoresis therapy was yesterday.  Objective: Current vital signs: BP 94/58  Pulse 61  Temp(Src) 98.4 F (36.9 C) (Oral)  Resp 19  Ht 6' (1.829 m)  Wt 79.4 kg (175 lb 0.7 oz)  BMI 23.74 kg/m2  SpO2 97% Vital signs in last 24 hours: Temp:  [97.2 F (36.2 C)-98.7 F (37.1 C)] 98.4 F (36.9 C) (02/14 0800) Pulse Rate:  [51-71] 61 (02/14 0908) Resp:  [17-28] 19 (02/14 0908) BP: (87-111)/(47-89) 94/58 mmHg (02/14 0908) SpO2:  [93 %-100 %] 97 % (02/14 0908) FiO2 (%):  [30 %-40 %] 30 % (02/14 0908) Weight:  [79.4 kg (175 lb 0.7 oz)] 79.4 kg (175 lb 0.7 oz) (02/14 0500)  Intake/Output from previous day: 02/13 0701 - 02/14 0700 In: 688.5 [I.V.:688.5] Out: 1000 [Urine:1000] Intake/Output this shift:   Nutritional status: NPO  Neurologic Exam: Mental Status:  Alert. Able to follow 3 step commands without difficulty.  Cranial Nerves:  II: Discs flat bilaterally; Visual fields grossly normal, pupils equal, round, reactive to light and accommodation  III,IV, VI: Left ptosis minimal. Decreased right downward gaze  V,VII: smile symmetric, facial light touch sensation normal bilaterally  VIII: hearing normal bilaterally  IX,X: gag reflex not tested  XI: bilateral shoulder shrug  XII: midline tongue extension  Motor:  Able to lift all extremities against gravity. Tone and bulk:normal tone throughout; no atrophy noted  Sensory: Pinprick and light touch intact throughout, bilaterally  Deep Tendon Reflexes: 2+ and symmetric throughout  Plantars:  Right: downgoing     Left: downgoing    Lab Results: Basic Metabolic Panel:  Recent Labs Lab 07/20/12 0428  07/21/12 0430 07/22/12 0634 07/22/12 1200 07/23/12 0439 07/24/12 0420 07/24/12 0820  NA 135  < > 139 138 140 141  --  140  K 3.8  < > 4.1 3.9 3.6 3.7  --  3.6  CL 100  < > 104 100 101 106  --  102  CO2 26  --   27 26  --  24  --  23  GLUCOSE 103*  < > 94 98 97 90  --  80  BUN 16  < > 16 23 24* 26*  --  26*  CREATININE 0.53  < > 0.57 0.68 1.00 0.61 0.51 0.53  CALCIUM 8.7  --  9.2 9.1  --  8.9  --  9.1  < > = values in this interval not displayed.  Liver Function Tests: No results found for this basename: AST, ALT, ALKPHOS, BILITOT, PROT, ALBUMIN,  in the last 168 hours No results found for this basename: LIPASE, AMYLASE,  in the last 168 hours No results found for this basename: AMMONIA,  in the last 168 hours  CBC:  Recent Labs Lab 07/21/12 0430 07/22/12 0634 07/22/12 1200 07/23/12 0435 07/24/12 0420 07/25/12 0500  WBC 7.8 7.1  --  8.4 8.5 8.4  NEUTROABS  --  4.7  --   --   --   --   HGB 13.1 12.8* 13.9 12.4* 12.0* 11.8*  HCT 39.7 38.2* 41.0 37.9* 36.0* 34.9*  MCV 90.4 90.1  --  90.9 90.7 89.5  PLT 177 217  --  202 232 201    Cardiac Enzymes: No results found for this basename: CKTOTAL, CKMB, CKMBINDEX, TROPONINI,  in the last 168 hours  Lipid Panel: No results found for this basename: CHOL, TRIG, HDL, CHOLHDL,  VLDL, LDLCALC,  in the last 168 hours  CBG:  Recent Labs Lab 07/23/12 1517 07/24/12 0022 07/24/12 0413 07/24/12 0832 07/24/12 1241  GLUCAP 86 81 88 80 87    Microbiology: Results for orders placed during the hospital encounter of 07/10/12  CULTURE, BLOOD (ROUTINE X 2)     Status: None   Collection Time    07/10/12 11:42 PM      Result Value Range Status   Specimen Description BLOOD LH   Final   Special Requests     Final   Value: NONE BOTTLES DRAWN AEROBIC AND ANAEROBIC 2CC] BOTTLES DRAWN AEROBIC ONLY 2CC   Culture  Setup Time 07/11/2012 09:10   Final   Culture NO GROWTH 5 DAYS   Final   Report Status 07/17/2012 FINAL   Final  CULTURE, BLOOD (ROUTINE X 2)     Status: None   Collection Time    07/10/12 11:47 PM      Result Value Range Status   Specimen Description BLOOD LW   Final   Special Requests NONE BOTTLES DRAWN AEROBIC AND ANAEROBIC 3CC   Final    Culture  Setup Time 07/11/2012 09:10   Final   Culture NO GROWTH 5 DAYS   Final   Report Status 07/17/2012 FINAL   Final  MRSA PCR SCREENING     Status: None   Collection Time    07/11/12  3:35 AM      Result Value Range Status   MRSA by PCR NEGATIVE  NEGATIVE Final   Comment:            The GeneXpert MRSA Assay (FDA     approved for NASAL specimens     only), is one component of a     comprehensive MRSA colonization     surveillance program. It is not     intended to diagnose MRSA     infection nor to guide or     monitor treatment for     MRSA infections.  URINE CULTURE     Status: None   Collection Time    07/11/12  6:09 AM      Result Value Range Status   Specimen Description URINE, CATHETERIZED   Final   Special Requests NONE   Final   Culture  Setup Time 07/11/2012 09:18   Final   Colony Count NO GROWTH   Final   Culture NO GROWTH   Final   Report Status 07/12/2012 FINAL   Final  CULTURE, BLOOD (ROUTINE X 2)     Status: None   Collection Time    07/12/12  1:50 AM      Result Value Range Status   Specimen Description BLOOD LEFT HAND   Final   Special Requests BOTTLES DRAWN AEROBIC AND ANAEROBIC 2.5ML   Final   Culture  Setup Time 07/12/2012 16:20   Final   Culture NO GROWTH 5 DAYS   Final   Report Status 07/18/2012 FINAL   Final  CULTURE, BLOOD (ROUTINE X 2)     Status: None   Collection Time    07/12/12  2:00 AM      Result Value Range Status   Specimen Description BLOOD LEFT ARM   Final   Special Requests BOTTLES DRAWN AEROBIC AND ANAEROBIC   Final   Culture  Setup Time 07/12/2012 16:20   Final   Culture NO GROWTH 5 DAYS   Final   Report Status 07/18/2012 FINAL   Final  CULTURE, RESPIRATORY     Status: None   Collection Time    07/13/12  5:57 AM      Result Value Range Status   Specimen Description TRACHEAL ASPIRATE   Final   Special Requests NONE   Final   Gram Stain     Final   Value: FEW WBC PRESENT, PREDOMINANTLY PMN     RARE SQUAMOUS EPITHELIAL  CELLS PRESENT     NO ORGANISMS SEEN   Culture RARE CANDIDA ALBICANS   Final   Report Status 07/15/2012 FINAL   Final  MRSA PCR SCREENING     Status: None   Collection Time    07/16/12  3:54 PM      Result Value Range Status   MRSA by PCR NEGATIVE  NEGATIVE Final   Comment:            The GeneXpert MRSA Assay (FDA     approved for NASAL specimens     only), is one component of a     comprehensive MRSA colonization     surveillance program. It is not     intended to diagnose MRSA     infection nor to guide or     monitor treatment for     MRSA infections.  URINE CULTURE     Status: None   Collection Time    07/16/12  3:54 PM      Result Value Range Status   Specimen Description URINE, CATHETERIZED   Final   Special Requests NONE   Final   Culture  Setup Time 07/16/2012 16:54   Final   Colony Count NO GROWTH   Final   Culture NO GROWTH   Final   Report Status 07/17/2012 FINAL   Final  CULTURE, BAL-QUANTITATIVE     Status: None   Collection Time    07/18/12 12:41 PM      Result Value Range Status   Specimen Description BRONCHIAL ALVEOLAR LAVAGE   Final   Special Requests RLL   Final   Gram Stain     Final   Value: RARE WBC PRESENT, PREDOMINANTLY PMN     RARE SQUAMOUS EPITHELIAL CELLS PRESENT     NO ORGANISMS SEEN   Colony Count 5,000 COLONIES/ML   Final   Culture Non-Pathogenic Oropharyngeal-type Flora Isolated.   Final   Report Status 07/20/2012 FINAL   Final  FUNGUS CULTURE W SMEAR     Status: None   Collection Time    07/21/12  5:48 PM      Result Value Range Status   Specimen Description TRACHEAL ASPIRATE   Final   Special Requests NONE   Final   Fungal Smear NO YEAST OR FUNGAL ELEMENTS SEEN   Final   Culture YEAST ISOLATED;ID TO FOLLOW   Final   Report Status PENDING   Incomplete  AFB CULTURE WITH SMEAR     Status: None   Collection Time    07/21/12  5:49 PM      Result Value Range Status   Specimen Description TRACHEAL ASPIRATE   Final   Special Requests NONE    Final   ACID FAST SMEAR NO ACID FAST BACILLI SEEN   Final   Culture     Final   Value: CULTURE WILL BE EXAMINED FOR 6 WEEKS BEFORE ISSUING A FINAL REPORT   Report Status PENDING   Incomplete  CULTURE, BAL-QUANTITATIVE     Status: None   Collection Time    07/22/12 10:50 AM  Result Value Range Status   Specimen Description BRONCHIAL ALVEOLAR LAVAGE   Final   Special Requests NONE   Final   Gram Stain     Final   Value: RARE WBC PRESENT, PREDOMINANTLY PMN     RARE SQUAMOUS EPITHELIAL CELLS PRESENT     NO ORGANISMS SEEN   Colony Count 30,000 COLONIES/ML   Final   Culture Non-Pathogenic Oropharyngeal-type Flora Isolated.   Final   Report Status 07/25/2012 FINAL   Final    Coagulation Studies: No results found for this basename: LABPROT, INR,  in the last 72 hours  Imaging: Ir Gastrostomy Tube Mod Sed  07/24/2012  *RADIOLOGY REPORT*  Clinical history:57 year old with myasthenia gravis and severe dysphagia.  PROCEDURE(S): PERCUTANEOUS GASTROSTOMY TUBE WITH FLUOROSCOPIC GUIDANCE  Physician: Rachelle Hora. Lowella Dandy, MD  Medications:Versed 2.00mg , Fentanyl 125 mcg. A radiology nurse monitored the patient for moderate sedation.  Moderate sedation time:27 minutes  Fluoroscopy time: 4.4 minutes  Procedure:Informed consent was obtained for a percutaneous gastrostomy tube.  The patient was placed on the interventional table.  Fluoroscopy demonstrated oral contrast in the transverse colon.  An orogastric tube was placed with fluoroscopic guidance. The anterior abdomen was prepped and draped in sterile fashion. Maximal barrier sterile technique was utilized including caps, mask, sterile gowns, sterile gloves, sterile drape, hand hygiene and skin antiseptic.  Stomach was inflated with air through the orogastric tube.  The skin and subcutaneous tissues were anesthetized with 1% lidocaine.  A 17 gauge needle was directed into the distended stomach with fluoroscopic guidance.  A wire was advanced into the stomach.   A 9-French vascular sheath was placed and the orogastric tube was snared using a Gooseneck snare device. The orogastric tube and snare were pulled out of the patient's mouth.  The snare device was connected to a 20-French gastrostomy tube.  The snare device and gastrostomy tube were pulled through the patient's mouth and out the anterior abdominal wall.  The gastrostomy tube was cut to an appropriate length.  Contrast injection through gastrostomy tube confirmed placement within the stomach.  Fluoroscopic images were obtained for documentation.  The gastrostomy tube was flushed with normal saline.  Findings:Gastrostomy tube within the stomach.  Complications: None  Impression:Successful fluoroscopic guided percutaneous gastrostomy tube placement.   Original Report Authenticated By: Richarda Overlie, M.D.    Dg Chest Port 1 View  07/24/2012  *RADIOLOGY REPORT*  Clinical Data: Shortness of breath  PORTABLE CHEST - 1 VIEW  Comparison: 07/22/2012  Findings: Stable tracheostomy.  Stable right IJ venous catheter.  Interval placement of an enteric tube which courses below the diaphragm.  Small layering right pleural effusion. Associated right lower lobe opacity, atelectasis versus pneumonia.  Left basilar atelectasis  IMPRESSION: Stable tracheostomy.  Small layering right pleural effusion.  Associated right lower lobe opacity, atelectasis versus pneumonia.   Original Report Authenticated By: Charline Bills, M.D.    Dg Abd Portable 1v  07/24/2012  *RADIOLOGY REPORT*  Clinical Data: Pre gastrostomy, check barium  PORTABLE ABDOMEN - 1 VIEW  Comparison: 07/23/2012  Findings: Enteric tube terminates in the antral pyloric region.  Dense contrast in the ascending colon.  Additional residual contrast in the transverse and proximal descending colon.  IMPRESSION: Contrast predominately in the right colon, as described above.   Original Report Authenticated By: Charline Bills, M.D.    Dg Abd Portable 1v  07/23/2012  *RADIOLOGY  REPORT*  Clinical Data: Nasogastric tube placement  PORTABLE ABDOMEN - 1 VIEW  Comparison: Portable exam 1158 hours without  priors for comparison  Findings: Nasogastric tube tip projects over right upper quadrant at expected position of the pylorus or duodenal bulb. Bowel gas pattern normal. No pathologic calcifications or acute osseous findings.  IMPRESSION: Tip of nasogastric tube projects over the right upper quadrant over the expected position of the pylorus or duodenal bulb.   Original Report Authenticated By: Ulyses Southward, M.D.     Medications:  I have reviewed the patient's current medications. Scheduled: . albuterol  2.5 mg Nebulization Q8H  . antiseptic oral rinse  15 mL Mouth Rinse QID  . aspirin  81 mg Oral Daily  . chlorhexidine  15 mL Mouth Rinse BID  . citrate dextrose      . heparin  1,000 Units Intracatheter Once  . heparin  1,000 Units Intracatheter Once  . heparin  1,000 Units Intracatheter Once  . heparin  1,000 Units Intracatheter Once  . heparin lock flush  500 Units Intravenous Once  . pantoprazole sodium  40 mg Per Tube Daily  . pyridostigmine  39.6 mg Oral Q4H  . sodium chloride  10-40 mL Intracatheter Q12H    Assessment/Plan: Patient with MG now s/p IVIg and plasmaphoresis (5 treatments).  Has had some improvement.  Clearly not back to baseline.  Patient's may at times have some further delayed benefit from plasmaphoresis.  Would not further extend TPE treatments at this time.    Recommendations: 1.Will continue Mestinon at current dosage.  Will continue to evaluate for need of further adjustment.      LOS: 15 days   Thana Farr, MD [facility] [phone] 07/25/2012  12:01 PM

## 2012-07-25 NOTE — Evaluation (Signed)
Occupational Therapy Evaluation Patient Details Name: Victor Durham MRN: [id number] DOB: [birth year] Today's Date: 07/25/2012 Time: 1541-1600 OT Time Calculation (min): 19 min  OT Assessment / Plan / Recommendation Clinical Impression  Pt s/p Acute respiratory failure with VDRF, now with trach and PEG and myasthenia gravis. Will benefit from acute OT services to address below problem list. Recommend SNF for d/c planning.    OT Assessment  Patient needs continued OT Services    Follow Up Recommendations  SNF    Barriers to Discharge Inaccessible home environment;Decreased caregiver support pt is homeless  Equipment Recommendations  None recommended by OT    Recommendations for Other Services    Frequency  Min 2X/week    Precautions / Restrictions Precautions Precautions: Fall Restrictions Weight Bearing Restrictions: No   Pertinent Vitals/Pain See vitals    ADL  Grooming: Performed;Wash/dry face;Supervision/safety Where Assessed - Grooming: Unsupported sitting Upper Body Bathing: Simulated;Supervision/safety Where Assessed - Upper Body Bathing: Unsupported sitting Lower Body Bathing: Simulated;Moderate assistance Where Assessed - Lower Body Bathing: Unsupported sitting Upper Body Dressing: Simulated;Supervision/safety Where Assessed - Upper Body Dressing: Unsupported sitting Lower Body Dressing: Simulated;Moderate assistance Where Assessed - Lower Body Dressing: Unsupported sitting Transfers/Ambulation Related to ADLs: IV team arrived. returned pt to bed. ADL Comments: Pt sat EOB holding chin as if to hold head up.  Pt reports (pt writes on notepad to communicate) that he feels that his head will off if he lets go.  Encouraged pt to let go of head, and pt able to hold up without assist from OT.      OT Diagnosis: Generalized weakness;Cognitive deficits  OT Problem List: Decreased strength;Decreased activity tolerance;Decreased cognition;Decreased knowledge of  precautions OT Treatment Interventions: Self-care/ADL training;DME and/or AE instruction;Therapeutic activities;Cognitive remediation/compensation;Patient/family education;Balance training   OT Goals Acute Rehab OT Goals OT Goal Formulation: With patient Time For Goal Achievement: 08/08/12 Potential to Achieve Goals: Good ADL Goals Pt Will Transfer to Toilet: with supervision;Ambulation;with DME;3-in-1;Comfort height toilet ADL Goal: Toilet Transfer - Progress: Goal set today Pt Will Perform Toileting - Clothing Manipulation: with supervision;Standing ADL Goal: Toileting - Clothing Manipulation - Progress: Goal set today Miscellaneous OT Goals Miscellaneous OT Goal #1: Pt will perform bed mobility with supervision as precursor for EOB ADL retraining.  OT Goal: Miscellaneous Goal #1 - Progress: Goal set today Miscellaneous OT Goal #2: Pt will demonstrate alternating attention 75% during ADL task with 1 verbal cue to refocus. OT Goal: Miscellaneous Goal #2 - Progress: Goal set today Miscellaneous OT Goal #3: Pt will perform full bathing/dressing task at mod I level. OT Goal: Miscellaneous Goal #3 - Progress: Goal set today  Visit Information  Last OT Received On: 07/25/12 Assistance Needed: +2    Subjective Data      Prior Functioning     Home Living Lives With:  (going through divorce, currently homeless) Prior Function Level of Independence: Independent Able to Take Stairs?: Yes Driving: Yes Comments: Difficult to gather information from pt (unwilling vs unable?)  Pt did reports he enjoys working out and is eager to work with therapy. Communication Communication: Tracheostomy Dominant Hand: Right         Vision/Perception     Cognition  Cognition Overall Cognitive Status: Impaired Area of Impairment: Following commands;Attention Arousal/Alertness: Awake/alert Orientation Level: Disoriented to;Time;Situation;Place Behavior During Session: [facility] for tasks  performed Current Attention Level: Sustained Attention - Other Comments: Pt will begin writing on notepad but quickly begins to just write without looking at paper and begins to not make  sense with his words.  Requires cues to focus attention back.    Extremity/Trunk Assessment Right Upper Extremity Assessment RUE ROM/Strength/Tone: WFL for tasks assessed (3+/5 - 4/5 throughout) Left Upper Extremity Assessment LUE ROM/Strength/Tone: WFL for tasks assessed (3+/5 - 4/5 throughout)     Mobility Bed Mobility Bed Mobility: Rolling Right;Right Sidelying to Sit;Sitting - Scoot to [place] of Bed;Sit to Supine Rolling Right: 1: +2 Total assist Rolling Right: Patient Percentage: 70% Right Sidelying to Sit: 1: +2 Total assist;With rails Right Sidelying to Sit: Patient Percentage: 70% Sitting - Scoot to Edge of Bed: 4: Min assist Sit to Supine: 2: Max assist Details for Bed Mobility Assistance: cues for technique     Exercise     Balance Balance Balance Assessed: Yes Static Sitting Balance Static Sitting - Balance Support: Feet supported Static Sitting - Level of Assistance: 5: Stand by assistance Static Sitting - Comment/# of Minutes: 5 min Dynamic Sitting Balance Dynamic Sitting - Balance Support: During functional activity;Feet supported Dynamic Sitting - Level of Assistance: 5: Stand by assistance Dynamic Sitting Balance - Compensations: close guarding while pt reaching to left side for his notepad Dynamic Sitting - Balance Activities: Lateral lean/weight shifting;Reaching for objects   End of Session OT - End of Session Activity Tolerance: Patient tolerated treatment well Patient left: in bed;with call bell/phone within reach  GO    07/25/2012 Cipriano Mile OTR/L Pager [phone] Office [phone]  Cipriano Mile 07/25/2012, 5:32 PM

## 2012-07-25 NOTE — Progress Notes (Signed)
Heparin off at this time per Dr. Delton Coombes

## 2012-07-25 NOTE — Progress Notes (Signed)
Patient ID: Victor Durham, male   DOB: [birth year], 57 y.o.   MRN: [id number]   Perc G tube placed 2/13  Site clean and dry; NT; +BS Wbc wnl; afeb  May use G tube now

## 2012-07-25 NOTE — Evaluation (Signed)
[facility] Speaking Valve - Evaluation Patient Details  Name: Victor Durham MRN: [id number] Date of Birth: 06-Oct-1955  Today's Date: 07/25/2012 Time: 1130-1200 SLP Time Calculation (min): 30 min  Past Medical History:  Past Medical History  Diagnosis Date  . DVT (deep venous thrombosis)   . Hypertension   . High cholesterol    Past Surgical History:  Past Surgical History  Procedure Laterality Date  . Esophagogastroduodenoscopy  07/11/2012    Procedure: ESOPHAGOGASTRODUODENOSCOPY (EGD);  Surgeon: Shirley Friar, MD;  Location: [facility];  Service: Endoscopy;  Laterality: N/A;  BEDSIDE   HPI:  57 y/o homeless male presented to the [facility] ED On 1/30 for the third time in two weeks for evaluation of dysphagia. He complained of some difficulty breathing and underwent a neck CT. Shortly after that procedure he developed respiratory and cardiac arrest. Pt underwent EGD on 1/31 that showed gastritis and esophagitis. MRI shows demyelinating process; concern for Myasthenia gravis. Dx of aspiration pna. Mestinon, plasmapheresis started 2/4, pt slowly improving per neuro. ETT 1/30-2/9. Trach placed 2/11, PEG 2/13. On ATC 2/12 in pm, again on 2/14 in am.    Assessment / Plan / Recommendation Clinical Impression  Pt demonstrated minimal ability to phonate during inital placement of valve. Overall pt does not tolerate valve placement for more than 30 seconds due to decreased redirection of air to upper airway, suspected CO2 trapping and pt sensation of inability to breathe. There was no significant change in vital signs and audible build up of air was minimal with removal, however pt tolerance of discomfort was poor. Suspect anxiety also playing a role here. In any case, phonation was hoarse, wet and breath support very poor. At this time pt may oly wear PMSV with SLP trials. Pt will likely require a downsize to a 6 in order to achieve adequate redirection of air around tracheostomy. SLP will  continue efforts. Cuff was left deflated at end of session; will contact MD for approval to leave cuff deflated at baseline.     SLP Assessment  Patient needs continued Speech Lanaguage Pathology Services    Follow Up Recommendations       Frequency and Duration min 2x/week  2 weeks   Pertinent Vitals/Pain NA    SLP Goals Potential to Achieve Goals: Good SLP Goal #1: Pt will tolerate PMSV with intermittent supervision during all waking hours   PMSV Trial  PMSV was placed for: 30 second to one minute intervals Able to redirect subglottic air through upper airway: Yes Able to Attain Phonation: Yes Voice Quality: Hoarse;Low vocal intensity Able to Expectorate Secretions: Yes Level of Secretion Expectoration with PMSV: Oral;Tracheal Breath Support for Phonation: Moderately decreased Intelligibility: Intelligibility reduced Word: 25-49% accurate Phrase: 25-49% accurate Sentence: 0-24% accurate Respirations During Trial: 26 SpO2 During Trial: 96 % Behavior: Alert;Anxious   Tracheostomy Tube  Additional Tracheostomy Tube Assessment Trach Collar Period: 4 hours today Secretion Description: blood tinged yellow. thin Frequency of Tracheal Suctioning: 3x so far today Level of Secretion Expectoration: Tracheal    Vent Dependency  Vent Dependent: Yes FiO2 (%): 30 % Nocturnal Vent: Yes    Cuff Deflation Trial Tolerated Cuff Deflation: Yes Length of Time for Cuff Deflation Trial: 30 minutes Behavior: Alert;Anxious Cuff Deflation Trial - Comments: required sutction, copious secretions, then cleared   Jarielys Girardot, Riley Nearing 07/25/2012, 12:30 PM

## 2012-07-25 NOTE — Progress Notes (Signed)
PULMONARY  / CRITICAL CARE MEDICINE  Name: MD SMOLA MRN: [id number] DOB: 08-06-1955    ADMISSION DATE:  07/10/2012 CONSULTATION DATE:  1/30/20143  REFERRING MD :  Juleen China  CHIEF COMPLAINT:  Dysphagia  BRIEF PATIENT DESCRIPTION: 57 y/o homeless male presented to the [facility] ED On 1/30 for the third time in two weeks for evaluation of dysphagia.  He complained of some difficulty breathing and underwent a neck CT.  Shortly after that procedure he developed respiratory and cardiac arrest.  PCCM called for admission.  SIGNIFICANT EVENTS / STUDIES:  1/30 respiratory and cardiac arrest in ED 1/30 CT head: hypoattenuation R midbrain extending into L pons, poorly characterized due to streak artifact 130 Echocardiogram: LVEF normal. Mild to moderate RV dilatation 1/31 EGD gastritis and esophagitis but no mass or bleeding source 1/31 LE venous duplex scans: LLE DVT 1/31 Thick secretions -plugged ETT during MRI 1/31 MRI -Scattered subcortical T2 hyperintensities -nonspecific, chronic microvascular ischemia vs a demyelinating process  2/1 Neurology consult: concern for myasthenia gravis 2/1 CT chest - no thymoma or central PE, Bilateral lower lobe consolidation and atelectasis , Nonspecific enhancing solid 4.1 cm mass exophytic from segment 2 of the left hepatic lobe  2/2 Mestinon started - intolerant due to excessive secretions 2/3 IVIG initiated by Neuro 2/4 Acetylcholine Receptor Ab 127.00 (<=0.30 mmol/L) 2/4 MRI cervical spine >> normal 2/4 Mestinon retried at a decreased dose 2-4 tx to cone. 2-7 Bronchoscopy 2/9- extubated 2/10 re intubated 2/11 trach 2/13- peg  LINES / TUBES: 1/30 ETT >> 2/9 1/30 L [place] CVL >> 2/5  RtI J HD Cath insertion>>   CULTURES: 1/30 blood >> NEG 1/30 urine >> NEG 2-2 sputum>> NEG 2-1 bc x 2 >> NEG 2-7 BAL >> oral flora 2-11 BAL >>  ANTIBIOTICS: unasyn 1/30 >> 2/4 levaquin 1/30 >> 2/4  SUBJECTIVE:  S/p G-tube placement 2/14.  Last plasmapheresis  2/13.   ATC during day, mandatory vent at night.   VITAL SIGNS: Temp:  [97.8 F (36.6 C)-98.7 F (37.1 C)] 98.4 F (36.9 C) (02/14 1234) Pulse Rate:  [51-71] 59 (02/14 1234) Resp:  [17-28] 20 (02/14 1234) BP: (87-111)/(49-89) 88/71 mmHg (02/14 1234) SpO2:  [93 %-100 %] 98 % (02/14 1336) FiO2 (%):  [30 %-40 %] 30 % (02/14 1336) Weight:  [79.4 kg (175 lb 0.7 oz)] 79.4 kg (175 lb 0.7 oz) (02/14 0500) VENTILATOR SETTINGS: Vent Mode:  [-] PRVC FiO2 (%):  [30 %-40 %] 30 % Set Rate:  [18 bmp] 18 bmp Vt Set:  [550 mL] 550 mL PEEP:  [5 cmH20] 5 cmH20 Plateau Pressure:  [15 cmH20] 15 cmH20 INTAKE / OUTPUT: Intake/Output     02/13 0701 - 02/14 0700 02/14 0701 - 02/15 0700   I.V. (mL/kg) 688.5 (8.7)    IV Piggyback     Total Intake(mL/kg) 688.5 (8.7)    Urine (mL/kg/hr) 1000 (0.5)    Total Output 1000     Net -311.5            PHYSICAL EXAMINATION: Gen: tired but alert HEENT: NG in place, trach site clean PULM: coarse breath sounds bilaterally; decreased in right base CV: s1s2 regular, no murmur AB: Soft, non tender Ext: trace symmetric non-pitting edema Neuro: moves extremities, follows commands  LABS:  Recent Labs Lab 07/21/12 1400 07/21/12 1443 07/22/12 0634 07/22/12 1200 07/23/12 0435 07/23/12 0439 07/24/12 0420 07/24/12 0820 07/25/12 0500  HGB  --   --  12.8* 13.9 12.4*  --  12.0*  --  11.8*  WBC  --   --  7.1  --  8.4  --  8.5  --  8.4  PLT  --   --  217  --  202  --  232  --  201  NA  --   --  138 140  --  141  --  140  --   K  --   --  3.9 3.6  --  3.7  --  3.6  --   CL  --   --  100 101  --  106  --  102  --   CO2  --   --  26  --   --  24  --  23  --   GLUCOSE  --   --  98 97  --  90  --  80  --   BUN  --   --  23 24*  --  26*  --  26*  --   CREATININE  --   --  0.68 1.00  --  0.61 0.51 0.53  --   CALCIUM  --   --  9.1  --   --  8.9  --  9.1  --   APTT 38*  --   --   --   --   --   --   --   --   INR 1.33  --   --   --   --   --   --   --   --   PHART   --  7.445  --   --   --   --   --   --   --   PCO2ART  --  34.2*  --   --   --   --   --   --   --   PO2ART  --  85.0  --   --   --   --   --   --   --     Recent Labs Lab 07/23/12 1517 07/24/12 0022 07/24/12 0413 07/24/12 0832 07/24/12 1241  GLUCAP 86 81 88 80 87    CXR: Ir Gastrostomy Tube Mod Sed  07/24/2012  *RADIOLOGY REPORT*  Clinical history:57 year old with myasthenia gravis and severe dysphagia.  PROCEDURE(S): PERCUTANEOUS GASTROSTOMY TUBE WITH FLUOROSCOPIC GUIDANCE  Physician: Rachelle Hora. Lowella Dandy, MD  Medications:Versed 2.00mg , Fentanyl 125 mcg. A radiology nurse monitored the patient for moderate sedation.  Moderate sedation time:27 minutes  Fluoroscopy time: 4.4 minutes  Procedure:Informed consent was obtained for a percutaneous gastrostomy tube.  The patient was placed on the interventional table.  Fluoroscopy demonstrated oral contrast in the transverse colon.  An orogastric tube was placed with fluoroscopic guidance. The anterior abdomen was prepped and draped in sterile fashion. Maximal barrier sterile technique was utilized including caps, mask, sterile gowns, sterile gloves, sterile drape, hand hygiene and skin antiseptic.  Stomach was inflated with air through the orogastric tube.  The skin and subcutaneous tissues were anesthetized with 1% lidocaine.  A 17 gauge needle was directed into the distended stomach with fluoroscopic guidance.  A wire was advanced into the stomach.  A 9-French vascular sheath was placed and the orogastric tube was snared using a Gooseneck snare device. The orogastric tube and snare were pulled out of the patient's mouth.  The snare device was connected to a 20-French gastrostomy tube.  The snare device and gastrostomy tube were pulled through the patient's mouth and out  the anterior abdominal wall.  The gastrostomy tube was cut to an appropriate length.  Contrast injection through gastrostomy tube confirmed placement within the stomach.  Fluoroscopic images  were obtained for documentation.  The gastrostomy tube was flushed with normal saline.  Findings:Gastrostomy tube within the stomach.  Complications: None  Impression:Successful fluoroscopic guided percutaneous gastrostomy tube placement.   Original Report Authenticated By: Richarda Overlie, M.D.    Dg Chest Port 1 View  07/24/2012  *RADIOLOGY REPORT*  Clinical Data: Shortness of breath  PORTABLE CHEST - 1 VIEW  Comparison: 07/22/2012  Findings: Stable tracheostomy.  Stable right IJ venous catheter.  Interval placement of an enteric tube which courses below the diaphragm.  Small layering right pleural effusion. Associated right lower lobe opacity, atelectasis versus pneumonia.  Left basilar atelectasis  IMPRESSION: Stable tracheostomy.  Small layering right pleural effusion.  Associated right lower lobe opacity, atelectasis versus pneumonia.   Original Report Authenticated By: Charline Bills, M.D.    Dg Abd Portable 1v  07/24/2012  *RADIOLOGY REPORT*  Clinical Data: Pre gastrostomy, check barium  PORTABLE ABDOMEN - 1 VIEW  Comparison: 07/23/2012  Findings: Enteric tube terminates in the antral pyloric region.  Dense contrast in the ascending colon.  Additional residual contrast in the transverse and proximal descending colon.  IMPRESSION: Contrast predominately in the right colon, as described above.   Original Report Authenticated By: Charline Bills, M.D.     ASSESSMENT / PLAN:  PULMONARY A: Acute respiratory failure due to Myasthenia Gravis, and Aspiration PNA, dramatic reintubated required 2/10, trach placed 2/11 P:   Trach collar day prior fatigued at 6 hrs, re attempt, needs noctunral mandatory vent for now; goal wean vent time down to ATC 24x7 Neg balance as goal See neuro Attempt VC, NIF daily   CARDIOVASCULAR A: LLE DVT P:  Heparin gtt ASA 81mg  tele  RENAL A:  Volume overload. Mild hypokalemia P:   Negative fluid balance as tolerated >> dosing lasix daily (80mg  x 1 2/14) F/u renal  fx, K supp as needed  GASTROINTESTINAL A:  Dysphagia due to MG. Nutrition. P:   TF  ppi g-tube placement done  HEMATOLOGIC A: Anemia of critical illness. Thrombocytopenia >> resolved P:  Heparin back on after g-tube placement Cbc in am   INFECTIOUS A:  Aspiration PNA. Completed Abx 2/04. P:   Monitor clinically f/u BAL rll, rml with trach Low threshold to start ABX if spikes Dc line if able to get PIV Dc hd cath 2/14  ENDOCRINE A:  Hyperglycemia. P:   cbgs q4 as NPO  NEUROLOGIC A: Myasthenia Gravis Anxiety P:   S/p IVIG mestinon  plasma exchange per neurology, likely needs total of 5 doses, last dose 2/13 Trach in place g-tube PT/OT consult Psych consult placed   Stay on pccm, to sdu vent bed Family contact:  Leone Crosby [name] [phone]; back up contact for father: [name]: [phone] Consented father, all risks, explained, death, bleeding, ptx, infection  Resolved problems >> cardiac arrest 2nd to respiratory failure  Levy Pupa, MD, PhD 07/25/2012, 2:51 PM [facility] [phone] or if no answer [phone]

## 2012-07-25 NOTE — Consult Note (Signed)
Patient Identification:  Victor Durham Date of Evaluation:  07/25/2012 Reason for Consult:  Medication Assessment, puttative Myasthenia Gravis  Referring Provider: Dr. Elwyn Reach  History of Present Illness:  Pt had presernted to [facility] ED 3 times in two weeks.  He c/o dysphagia.  He had difficulty breathing and had a CT.  After that procedure, he developed respiratory and cardiac arrest.  He was admitted.      Past Psychiatric History:Pt has a trach and only writes.  His father says he was never normal  He has problems with his siblings.  He married and wife filed for divorce.  He tried to work and eventually became homeless.  He denies ever seeing a psychiatrist, taking medications, feeling suicidal, hearing voices, seeing 'things', taking alcohol [no] cannabis [yes] crack cocaine [no].  His father says there were situations where his son [pt] thought he was being follow  - expressed dominant paranoid thoughts.   Past Medical History:     Past Medical History  Diagnosis Date  . DVT (deep venous thrombosis)   . Hypertension   . High cholesterol        Past Surgical History  Procedure Laterality Date  . Esophagogastroduodenoscopy  07/11/2012    Procedure: ESOPHAGOGASTRODUODENOSCOPY (EGD);  Surgeon: Shirley Friar, MD;  Location: [facility];  Service: Endoscopy;  Laterality: N/A;  BEDSIDE    Allergies: No Known Allergies  Current Medications:  Prior to Admission medications   Medication Sig Start Date End Date Taking? Authorizing Provider  aspirin 81 MG chewable tablet Chew 81 mg by mouth every morning.    Yes Historical Provider, MD    Social History:    reports that he has never smoked. He does not have any smokeless tobacco history on file. He reports that he does not drink alcohol or use illicit drugs.   Family History:    History reviewed. No pertinent family history.  Mental Status Examination/Evaluation: Objective:  Appearance: cachectic  Eye Contact::  Good   Speech:  NA and has a tracheostomy  Volume:  NA  Mood:    Affect:  Blunt  Thought Process:  Coherent, Goal Directed and Intact  Orientation:  Other:  oriented to self, situation and claimes he is self sufficient  Thought Content:  wrote short organized responses of explanations  Suicidal Thoughts:  No  Homicidal Thoughts:  No  Judgement:  Other:  may be influented by paranoid thoughts  Insight:  Fair   DIAGNOSIS:   AXIS I   Schizoaffective, paranoid thoughts;   AXIS II  Deferred  AXIS III See medical notes.  AXIS IV housing problems, other psychosocial or environmental problems, problems related to social environment, problems with primary support group and Pt has limited ability to express thoughts but uses pen and paper.   AXIS V 41-50 serious symptoms   Assessment/Plan:  Discussed with Dr. Elwyn Reach, Psych CSW,  Pt is awake alert and denies all manner of psychiatric thoughts in contrast to a stressed marriage and father's account of paranoid thoughts.  Pt claims he has lots of money and stays with family or at [employer].  This apparently is his first diagnosis of MS.  Does not offer any hx relatted to the onset of this illness. RECOMMENDATION:  1.  Pt has capacity by his answers but if there is a history of being paranoid, the dianosis may be expended.  2.  Pt requires further eval and will try to speak with father.  3.  Will follow pt.  Mickeal Skinner MD 07/25/2012 3:11 PM

## 2012-07-25 NOTE — Progress Notes (Signed)
ANTICOAGULATION CONSULT NOTE - Follow Up Consult  Pharmacy Consult for Heparin Indication: DVT  No Known Allergies  Patient Measurements: Height: 6' (182.9 cm) Weight: 175 lb 0.7 oz (79.4 kg) IBW/kg (Calculated) : 77.6 Heparin Dosing Weight: 77kg  Vital Signs: Temp: 98.4 F (36.9 C) (02/14 0800) Temp src: Oral (02/14 0800) BP: 88/71 mmHg (02/14 1233) Pulse Rate: 54 (02/14 1233)  Labs:  Recent Labs  07/23/12 0435 07/23/12 0439 07/23/12 1754 07/24/12 0420 07/24/12 0820 07/24/12 2127 07/25/12 0500 07/25/12 1200  HGB 12.4*  --   --  12.0*  --   --  11.8*  --   HCT 37.9*  --   --  36.0*  --   --  34.9*  --   PLT 202  --   --  232  --   --  201  --   HEPARINUNFRC  --   --  0.44  --   --  0.33  --  0.17*  CREATININE  --  0.61  --  0.51 0.53  --   --   --     Estimated Creatinine Clearance: 113.2 ml/min (by C-G formula based on Cr of 0.53).  Assessment: 56yom on heparin for LLE DVT (07/11/12). Heparin was turned off this morning for G-tube placement. He is now s/p G-tube placement and heparin to resume. Heparin level subtherapeutic today. Will adjust rate  Goal of Therapy:  Heparin level 0.3-0.7 units/ml Monitor platelets by anticoagulation protocol: Yes   Plan:  Increase heparin drip to 1900 units/hr F/u with heparin level

## 2012-07-25 NOTE — Clinical Social Work Psychosocial (Signed)
     Clinical Social Work Department BRIEF PSYCHOSOCIAL ASSESSMENT 07/25/2012  Patient:  Victor Durham, Victor Durham     Account Number:  [id number]     Admit date:  07/10/2012  Clinical Social Worker:  Margaree Mackintosh  Date/Time:  07/25/2012 01:10 PM  Referred by:  Physician  Date Referred:  07/25/2012 Referred for  SNF Placement   Other Referral:   Interview type:  Patient Other interview type:    PSYCHOSOCIAL DATA Living Status:  ALONE Admitted from facility:   Level of care:   Primary support name:  Autrey Human, [phone] Primary support relationship to patient:  PARENT Degree of support available:   Adequate.    CURRENT CONCERNS Current Concerns  Post-Acute Placement   Other Concerns:    SOCIAL WORK ASSESSMENT / PLAN Clinical Social Worker recieved referral for SNF at [facility]. CSW met with pt at bedside.  CSW reviewed PT recommendations.  Pt agreeable to SNF.  CSW reviewed SNF process and provided active listening for pt to address questions/concerns.  CSW to continue to follow and assist as needed.   Assessment/plan status:  Information/Referral to [facility] Other assessment/ plan:   Information/referral to community resources:   SNF    PATIENTS/FAMILYS RESPONSE TO PLAN OF CARE: Pt was pleasant and engaged in conversation. Pt thanked CSW for intervention.

## 2012-07-25 NOTE — Progress Notes (Signed)
Plasma exchange completed. Pt sleeping during treatment, HR dropping 48-56, when awake is 55-60. Primary RN notified. Pt did receive 150cc bolus for low bp x1. Post treatment bp 102/60. 1.0 volume exchange. Reported off to [facility]

## 2012-07-25 NOTE — Clinical Social Work Placement (Signed)
     Clinical Social Work Department CLINICAL SOCIAL WORK PLACEMENT NOTE 07/25/2012  Patient:  [name], [name]  Account Number:  [id number] Admit date:  07/10/2012  Clinical Social Worker:  Margaree Mackintosh  Date/time:  07/25/2012 01:18 PM  Clinical Social Work is seeking post-discharge placement for this patient at the following level of care:   SKILLED NURSING   (*CSW will update this form in Epic as items are completed)   07/25/2012  Patient/family provided with [facility] Department of Clinical Social Works list of facilities offering this level of care within the geographic area requested by the patient (or if unable, by the patients family).  07/25/2012  Patient/family informed of their freedom to choose among providers that offer the needed level of care, that participate in Medicare, Medicaid or managed care program needed by the patient, have an available bed and are willing to accept the patient.  07/25/2012  Patient/family informed of [facility] ownership interest in [facility], as well as of the fact that they are under no obligation to receive care at this facility.  PASARR submitted to EDS on 07/25/2012 PASARR number received from EDS on 07/25/2012  FL2 transmitted to all facilities in geographic area requested by pt/family on  07/25/2012 FL2 transmitted to all facilities within larger geographic area on   Patient informed that his/her managed care company has contracts with or will negotiate with  certain facilities, including the following:     Patient/family informed of bed offers received:   Patient chooses bed at  Physician recommends and patient chooses bed at    Patient to be transferred to  on   Patient to be transferred to facility by   The following physician request were entered in Epic:   Additional Comments:

## 2012-07-25 NOTE — Progress Notes (Signed)
Unable to start piv.Marland Kitchenleave central line in per Dr. Delton Coombes and remove HD catheter. IV team notified.

## 2012-07-26 LAB — CBC
MCV: 88.7 fL (ref 78.0–100.0)
Platelets: 202 10*3/uL (ref 150–400)
RBC: 4.17 MIL/uL — ABNORMAL LOW (ref 4.22–5.81)
WBC: 8.4 10*3/uL (ref 4.0–10.5)

## 2012-07-26 LAB — BASIC METABOLIC PANEL
CO2: 25 mEq/L (ref 19–32)
Chloride: 96 mEq/L (ref 96–112)
Sodium: 135 mEq/L (ref 135–145)

## 2012-07-26 LAB — CLOSTRIDIUM DIFFICILE BY PCR: Toxigenic C. Difficile by PCR: NEGATIVE

## 2012-07-26 LAB — HEPARIN LEVEL (UNFRACTIONATED)
Heparin Unfractionated: 0.22 IU/mL — ABNORMAL LOW (ref 0.30–0.70)
Heparin Unfractionated: 0.28 IU/mL — ABNORMAL LOW (ref 0.30–0.70)

## 2012-07-26 MED ORDER — HEPARIN (PORCINE) IN NACL 100-0.45 UNIT/ML-% IJ SOLN
1600.0000 [IU]/h | INTRAMUSCULAR | Status: DC
  Administered 2012-07-26: 1950 [IU]/h via INTRAVENOUS
  Administered 2012-07-27: 1650 [IU]/h via INTRAVENOUS
  Administered 2012-07-27: 1800 [IU]/h via INTRAVENOUS
  Administered 2012-07-28 – 2012-07-30 (×4): 1500 [IU]/h via INTRAVENOUS
  Administered 2012-07-30 – 2012-08-01 (×5): 1650 [IU]/h via INTRAVENOUS
  Administered 2012-08-02 – 2012-08-03 (×2): 1600 [IU]/h via INTRAVENOUS
  Filled 2012-07-26 (×16): qty 250

## 2012-07-26 MED ORDER — POTASSIUM CHLORIDE 20 MEQ/15ML (10%) PO LIQD
40.0000 meq | Freq: Three times a day (TID) | ORAL | Status: AC
  Administered 2012-07-26 (×3): 40 meq
  Filled 2012-07-26 (×3): qty 30

## 2012-07-26 MED ORDER — FUROSEMIDE 10 MG/ML IJ SOLN
40.0000 mg | Freq: Once | INTRAMUSCULAR | Status: AC
  Administered 2012-07-26: 40 mg via INTRAVENOUS
  Filled 2012-07-26: qty 4

## 2012-07-26 NOTE — Progress Notes (Signed)
Pt refused trach care this AM.

## 2012-07-26 NOTE — Progress Notes (Signed)
Paged Clance, MD regarding lab having trouble obtaining heparin levels (peripheral sticks d/t heparin running through central line).  New order received to put in for a PICC/midline if lab unable to obtain blood peripherally (2nd lab tech coming up to try).  Also made MD aware of pt not receiving tube feeds.  New order received to put in for a nutrition consult to have them assess for tube feeds.  Salomon Mast, RN

## 2012-07-26 NOTE — Progress Notes (Signed)
ANTICOAGULATION CONSULT NOTE - Follow Up Consult  Pharmacy Consult for heparin Indication: DVT  Labs:  Recent Labs  07/23/12 0435 07/23/12 0439  07/24/12 0420 07/24/12 0820 07/24/12 2127 07/25/12 0500 07/25/12 1200 07/26/12 0154  HGB 12.4*  --   --  12.0*  --   --  11.8*  --   --   HCT 37.9*  --   --  36.0*  --   --  34.9*  --   --   PLT 202  --   --  232  --   --  201  --   --   HEPARINUNFRC  --   --   < >  --   --  0.33  --  0.17* 0.22*  CREATININE  --  0.61  --  0.51 0.53  --   --   --   --   < > = values in this interval not displayed.   Assessment: 57yo male remains subtherapeutic on heparin with very little movement in level despite rate increase.  Goal of Therapy:  Heparin level 0.3-0.7 units/ml   Plan:  Will increase heparin gtt by ~3 units/kg/hr to 2100 units/hr and check level in 6hr.  Colleen Can PharmD BCPS 07/26/2012,2:37 AM

## 2012-07-26 NOTE — Progress Notes (Signed)
ANTICOAGULATION CONSULT NOTE - Follow Up Consult  Pharmacy Consult for Heparin Indication: DVT  No Known Allergies  Patient Measurements: Height: 6' (182.9 cm) Weight: 175 lb 0.7 oz (79.4 kg) IBW/kg (Calculated) : 77.6 Heparin Dosing Weight: 79.4kg  Vital Signs: Temp: 98.4 F (36.9 C) (02/15 1546) Temp src: Oral (02/15 1546) BP: 87/52 mmHg (02/15 1546) Pulse Rate: 125 (02/15 1525)  Labs:  Recent Labs  07/24/12 0420 07/24/12 0820  07/25/12 0500  07/26/12 0154 07/26/12 0500 07/26/12 0937 07/26/12 1826  HGB 12.0*  --   --  11.8*  --   --  12.4*  --   --   HCT 36.0*  --   --  34.9*  --   --  37.0*  --   --   PLT 232  --   --  201  --   --  202  --   --   HEPARINUNFRC  --   --   < >  --   < > 0.22*  --  0.28* 1.38*  CREATININE 0.51 0.53  --   --   --   --  0.58  --   --   < > = values in this interval not displayed.  Estimated Creatinine Clearance: 113.2 ml/min (by C-G formula based on Cr of 0.58).   Medications:  Heparin @ 2250 units/hr  Assessment: 56yom continues on heparin for LLE DVT (07/11/12). Heparin level is supratherapeutic after small rate increase earlier today. Spoke to RN - lab was drawn peripherally and heparin running through central line. Unsure why the level jumped so much. Would repeat lab to ensure accurate but due to patient being a hard stick, will just hold the infusion for an hour and then resume at a lower rate. No bleeding reported.  Goal of Therapy:  Heparin level 0.3-0.7 units/ml Monitor platelets by anticoagulation protocol: Yes   Plan:  1) Hold infusion for 1 hour 2) Resume at 1950 units/hr 3) 6 hour heparin level after gtt resumed  Fredrik Rigger 07/26/2012,7:09 PM

## 2012-07-26 NOTE — Progress Notes (Signed)
PULMONARY  / CRITICAL CARE MEDICINE  Name: Victor Durham MRN: [id number] DOB: Dec 26, 1955    ADMISSION DATE:  07/10/2012 CONSULTATION DATE:  1/30/20143  REFERRING MD :  Juleen China  CHIEF COMPLAINT:  Dysphagia  BRIEF PATIENT DESCRIPTION: 57 y/o homeless male presented to the [facility] ED On 1/30 for the third time in two weeks for evaluation of dysphagia.  He complained of some difficulty breathing and underwent a neck CT.  Shortly after that procedure he developed respiratory and cardiac arrest.  PCCM called for admission.  SIGNIFICANT EVENTS / STUDIES:  1/30 respiratory and cardiac arrest in ED 1/30 CT head: hypoattenuation R midbrain extending into L pons, poorly characterized due to streak artifact 130 Echocardiogram: LVEF normal. Mild to moderate RV dilatation 1/31 EGD gastritis and esophagitis but no mass or bleeding source 1/31 LE venous duplex scans: LLE DVT 1/31 Thick secretions -plugged ETT during MRI 1/31 MRI -Scattered subcortical T2 hyperintensities -nonspecific, chronic microvascular ischemia vs a demyelinating process  2/1 Neurology consult: concern for myasthenia gravis 2/1 CT chest - no thymoma or central PE, Bilateral lower lobe consolidation and atelectasis , Nonspecific enhancing solid 4.1 cm mass exophytic from segment 2 of the left hepatic lobe  2/2 Mestinon started - intolerant due to excessive secretions 2/3 IVIG initiated by Neuro 2/4 Acetylcholine Receptor Ab 127.00 (<=0.30 mmol/L) 2/4 MRI cervical spine >> normal 2/4 Mestinon retried at a decreased dose 2-4 tx to cone. 2-7 Bronchoscopy 2/9- extubated 2/10 re intubated 2/11 trach 2/13- peg  LINES / TUBES: 1/30 ETT >> 2/9 1/30 L [place] CVL >> 2/5  RtI J HD Cath insertion>>   CULTURES: 1/30 blood >> NEG 1/30 urine >> NEG 2-2 sputum>> NEG 2-1 bc x 2 >> NEG 2-7 BAL >> oral flora 2-11 BAL >>  ANTIBIOTICS: unasyn 1/30 >> 2/4 levaquin 1/30 >> 2/4  SUBJECTIVE:  S/p G-tube placement 2/14.  Last plasmapheresis  2/13.   ATC during day, mandatory vent at night. Currently with no increased wob, and he denies significant secretions that he suctions himself.   VITAL SIGNS: Temp:  [98.4 F (36.9 C)-99.1 F (37.3 C)] 98.6 F (37 C) (02/15 0817) Pulse Rate:  [54-121] 60 (02/15 1119) Resp:  [18-28] 23 (02/15 1119) BP: (88-118)/(55-71) 100/60 mmHg (02/15 0817) SpO2:  [92 %-100 %] 97 % (02/15 1119) FiO2 (%):  [30 %-40 %] 40 % (02/15 1119) VENTILATOR SETTINGS: Vent Mode:  [-] PRVC FiO2 (%):  [30 %-40 %] 40 % Set Rate:  [14 bmp-18 bmp] 18 bmp Vt Set:  [550 mL] 550 mL PEEP:  [5 cmH20] 5 cmH20 Plateau Pressure:  [14 cmH20-17 cmH20] 16 cmH20 INTAKE / OUTPUT: Intake/Output     02/14 0701 - 02/15 0700 02/15 0701 - 02/16 0700   I.V. (mL/kg) 440 (5.5) 205 (2.6)   Total Intake(mL/kg) 440 (5.5) 205 (2.6)   Urine (mL/kg/hr) 3275 (1.7) 50 (0.1)   Total Output 3275 50   Net -2835 +155          PHYSICAL EXAMINATION: Gen: tired but alert, no acute distress HEENT: NG in place, trach site clean PULM: coarse breath sounds bilaterally;  Decreased bs overall, no wheezing CV: s1s2 regular, no murmur AB: Soft, non tender Ext: trace symmetric non-pitting edema Neuro: moves extremities, follows commands  LABS:  Recent Labs Lab 07/21/12 1400 07/21/12 1443  07/23/12 0439 07/24/12 0420 07/24/12 0820 07/25/12 0500 07/26/12 0500  HGB  --   --   < >  --  12.0*  --  11.8* 12.4*  WBC  --   --   < >  --  8.5  --  8.4 8.4  PLT  --   --   < >  --  232  --  201 202  NA  --   --   < > 141  --  140  --  135  K  --   --   < > 3.7  --  3.6  --  3.3*  CL  --   --   < > 106  --  102  --  96  CO2  --   --   < > 24  --  23  --  25  GLUCOSE  --   --   < > 90  --  80  --  93  BUN  --   --   < > 26*  --  26*  --  23  CREATININE  --   --   < > 0.61 0.51 0.53  --  0.58  CALCIUM  --   --   < > 8.9  --  9.1  --  9.4  APTT 38*  --   --   --   --   --   --   --   INR 1.33  --   --   --   --   --   --   --   PHART  --  7.445   --   --   --   --   --   --   PCO2ART  --  34.2*  --   --   --   --   --   --   PO2ART  --  85.0  --   --   --   --   --   --   < > = values in this interval not displayed.  Recent Labs Lab 07/23/12 1517 07/24/12 0022 07/24/12 0413 07/24/12 0832 07/24/12 1241  GLUCAP 86 81 88 80 87    CXR: No results found.  ASSESSMENT / PLAN:  PULMONARY A: Acute respiratory failure due to Myasthenia Gravis, and Aspiration PNA, dramatic reintubated required 2/10, trach placed 2/11 P:   Trach collar day prior fatigued at 6 hrs, re attempt, needs noctunral mandatory vent for now; goal wean vent time down to ATC 24x7 Neg balance as goal See neuro Attempt VC, NIF daily   CARDIOVASCULAR A: LLE DVT P:  Heparin gtt ASA 81mg  tele  RENAL A:  Volume overload. Mild hypokalemia P:   Negative fluid balance as tolerated >> dosing lasix daily (80mg  x 1 2/14) F/u renal fx, K supp as needed  GASTROINTESTINAL A:  Dysphagia due to MG. Nutrition. P:   TF  ppi g-tube placement done  HEMATOLOGIC A: Anemia of critical illness. Thrombocytopenia >> resolved P:  Heparin back on after g-tube placement Cbc in am   INFECTIOUS A:  Aspiration PNA. Completed Abx 2/04. P:   Monitor clinically f/u BAL rll, rml with trach >> no signif organism isolated.  Low threshold to start ABX if spikes Dc line if able to get PIV Dc hd cath 2/14  ENDOCRINE A:  Hyperglycemia. P:   cbgs q4 as NPO  NEUROLOGIC A: Myasthenia Gravis Anxiety P:   S/p IVIG mestinon  plasma exchange per neurology, likely needs total of 5 doses, last dose 2/13 Trach in place g-tube PT/OT consult Psych consult placed   Stay on pccm, to sdu vent  bed Family contact:  Cillian Gwinner [name] [phone]; back up contact for father: [name]: [phone] Consented father, all risks, explained, death, bleeding, ptx, infection

## 2012-07-26 NOTE — Progress Notes (Signed)
Subjective: No complaints. Patient s/p 5/5 TPE treatments.  Reports that he feels that his secretions are lessening.    Objective: Current vital signs: BP 91/50  Pulse 60  Temp(Src) 98.5 F (36.9 C) (Oral)  Resp 23  Ht 6' (1.829 m)  Wt 79.4 kg (175 lb 0.7 oz)  BMI 23.74 kg/m2  SpO2 98% Vital signs in last 24 hours: Temp:  [98.4 F (36.9 C)-99.1 F (37.3 C)] 98.5 F (36.9 C) (02/15 1200) Pulse Rate:  [59-121] 60 (02/15 1200) Resp:  [18-24] 23 (02/15 1119) BP: (90-118)/(50-67) 91/50 mmHg (02/15 1200) SpO2:  [92 %-100 %] 98 % (02/15 1200) FiO2 (%):  [30 %-40 %] 40 % (02/15 1200)  Intake/Output from previous day: 02/14 0701 - 02/15 0700 In: 440 [I.V.:440] Out: 3275 [Urine:3275] Intake/Output this shift: Total I/O In: 205 [I.V.:205] Out: 50 [Urine:50] Nutritional status: NPO  Neurologic Exam: Mental Status:  Alert. Able to follow 3 step commands without difficulty.  Cranial Nerves:  II: Discs flat bilaterally; Visual fields grossly normal, pupils equal, round, reactive to light and accommodation  III,IV, VI: Left ptosis minimal. Decreased right downward gaze  V,VII: smile symmetric, facial light touch sensation normal bilaterally  VIII: hearing normal bilaterally  IX,X: gag reflex not tested  XI: bilateral shoulder shrug  XII: midline tongue extension  Motor:  Able to lift all extremities against gravity.  Tone and bulk:normal tone throughout; no atrophy noted  Sensory: Pinprick and light touch intact throughout, bilaterally  Deep Tendon Reflexes: 2+ and symmetric throughout  Plantars:  Right: downgoing   Left: downgoing   Lab Results: Basic Metabolic Panel:  Recent Labs Lab 07/21/12 0430 07/22/12 0634 07/22/12 1200 07/23/12 0439 07/24/12 0420 07/24/12 0820 07/26/12 0500  NA 139 138 140 141  --  140 135  K 4.1 3.9 3.6 3.7  --  3.6 3.3*  CL 104 100 101 106  --  102 96  CO2 27 26  --  24  --  23 25  GLUCOSE 94 98 97 90  --  80 93  BUN 16 23 24* 26*  --   26* 23  CREATININE 0.57 0.68 1.00 0.61 0.51 0.53 0.58  CALCIUM 9.2 9.1  --  8.9  --  9.1 9.4    Liver Function Tests: No results found for this basename: AST, ALT, ALKPHOS, BILITOT, PROT, ALBUMIN,  in the last 168 hours No results found for this basename: LIPASE, AMYLASE,  in the last 168 hours No results found for this basename: AMMONIA,  in the last 168 hours  CBC:  Recent Labs Lab 07/22/12 0634 07/22/12 1200 07/23/12 0435 07/24/12 0420 07/25/12 0500 07/26/12 0500  WBC 7.1  --  8.4 8.5 8.4 8.4  NEUTROABS 4.7  --   --   --   --   --   HGB 12.8* 13.9 12.4* 12.0* 11.8* 12.4*  HCT 38.2* 41.0 37.9* 36.0* 34.9* 37.0*  MCV 90.1  --  90.9 90.7 89.5 88.7  PLT 217  --  202 232 201 202    Cardiac Enzymes: No results found for this basename: CKTOTAL, CKMB, CKMBINDEX, TROPONINI,  in the last 168 hours  Lipid Panel: No results found for this basename: CHOL, TRIG, HDL, CHOLHDL, VLDL, LDLCALC,  in the last 168 hours  CBG:  Recent Labs Lab 07/23/12 1517 07/24/12 0022 07/24/12 0413 07/24/12 0832 07/24/12 1241  GLUCAP 86 81 88 80 87    Microbiology: Results for orders placed during the hospital encounter of 07/10/12  CULTURE,  BLOOD (ROUTINE X 2)     Status: None   Collection Time    07/10/12 11:42 PM      Result Value Range Status   Specimen Description BLOOD LH   Final   Special Requests     Final   Value: NONE BOTTLES DRAWN AEROBIC AND ANAEROBIC 2CC] BOTTLES DRAWN AEROBIC ONLY 2CC   Culture  Setup Time 07/11/2012 09:10   Final   Culture NO GROWTH 5 DAYS   Final   Report Status 07/17/2012 FINAL   Final  CULTURE, BLOOD (ROUTINE X 2)     Status: None   Collection Time    07/10/12 11:47 PM      Result Value Range Status   Specimen Description BLOOD LW   Final   Special Requests NONE BOTTLES DRAWN AEROBIC AND ANAEROBIC 3CC   Final   Culture  Setup Time 07/11/2012 09:10   Final   Culture NO GROWTH 5 DAYS   Final   Report Status 07/17/2012 FINAL   Final  MRSA PCR  SCREENING     Status: None   Collection Time    07/11/12  3:35 AM      Result Value Range Status   MRSA by PCR NEGATIVE  NEGATIVE Final   Comment:            The GeneXpert MRSA Assay (FDA     approved for NASAL specimens     only), is one component of a     comprehensive MRSA colonization     surveillance program. It is not     intended to diagnose MRSA     infection nor to guide or     monitor treatment for     MRSA infections.  URINE CULTURE     Status: None   Collection Time    07/11/12  6:09 AM      Result Value Range Status   Specimen Description URINE, CATHETERIZED   Final   Special Requests NONE   Final   Culture  Setup Time 07/11/2012 09:18   Final   Colony Count NO GROWTH   Final   Culture NO GROWTH   Final   Report Status 07/12/2012 FINAL   Final  CULTURE, BLOOD (ROUTINE X 2)     Status: None   Collection Time    07/12/12  1:50 AM      Result Value Range Status   Specimen Description BLOOD LEFT HAND   Final   Special Requests BOTTLES DRAWN AEROBIC AND ANAEROBIC 2.5ML   Final   Culture  Setup Time 07/12/2012 16:20   Final   Culture NO GROWTH 5 DAYS   Final   Report Status 07/18/2012 FINAL   Final  CULTURE, BLOOD (ROUTINE X 2)     Status: None   Collection Time    07/12/12  2:00 AM      Result Value Range Status   Specimen Description BLOOD LEFT ARM   Final   Special Requests BOTTLES DRAWN AEROBIC AND ANAEROBIC   Final   Culture  Setup Time 07/12/2012 16:20   Final   Culture NO GROWTH 5 DAYS   Final   Report Status 07/18/2012 FINAL   Final  CULTURE, RESPIRATORY     Status: None   Collection Time    07/13/12  5:57 AM      Result Value Range Status   Specimen Description TRACHEAL ASPIRATE   Final   Special Requests NONE   Final  Gram Stain     Final   Value: FEW WBC PRESENT, PREDOMINANTLY PMN     RARE SQUAMOUS EPITHELIAL CELLS PRESENT     NO ORGANISMS SEEN   Culture RARE CANDIDA ALBICANS   Final   Report Status 07/15/2012 FINAL   Final  MRSA PCR  SCREENING     Status: None   Collection Time    07/16/12  3:54 PM      Result Value Range Status   MRSA by PCR NEGATIVE  NEGATIVE Final   Comment:            The GeneXpert MRSA Assay (FDA     approved for NASAL specimens     only), is one component of a     comprehensive MRSA colonization     surveillance program. It is not     intended to diagnose MRSA     infection nor to guide or     monitor treatment for     MRSA infections.  URINE CULTURE     Status: None   Collection Time    07/16/12  3:54 PM      Result Value Range Status   Specimen Description URINE, CATHETERIZED   Final   Special Requests NONE   Final   Culture  Setup Time 07/16/2012 16:54   Final   Colony Count NO GROWTH   Final   Culture NO GROWTH   Final   Report Status 07/17/2012 FINAL   Final  CULTURE, BAL-QUANTITATIVE     Status: None   Collection Time    07/18/12 12:41 PM      Result Value Range Status   Specimen Description BRONCHIAL ALVEOLAR LAVAGE   Final   Special Requests RLL   Final   Gram Stain     Final   Value: RARE WBC PRESENT, PREDOMINANTLY PMN     RARE SQUAMOUS EPITHELIAL CELLS PRESENT     NO ORGANISMS SEEN   Colony Count 5,000 COLONIES/ML   Final   Culture Non-Pathogenic Oropharyngeal-type Flora Isolated.   Final   Report Status 07/20/2012 FINAL   Final  FUNGUS CULTURE W SMEAR     Status: None   Collection Time    07/21/12  5:48 PM      Result Value Range Status   Specimen Description TRACHEAL ASPIRATE   Final   Special Requests NONE   Final   Fungal Smear NO YEAST OR FUNGAL ELEMENTS SEEN   Final   Culture YEAST ISOLATED;ID TO FOLLOW   Final   Report Status PENDING   Incomplete  AFB CULTURE WITH SMEAR     Status: None   Collection Time    07/21/12  5:49 PM      Result Value Range Status   Specimen Description TRACHEAL ASPIRATE   Final   Special Requests NONE   Final   ACID FAST SMEAR NO ACID FAST BACILLI SEEN   Final   Culture     Final   Value: CULTURE WILL BE EXAMINED FOR 6 WEEKS  BEFORE ISSUING A FINAL REPORT   Report Status PENDING   Incomplete  CULTURE, BAL-QUANTITATIVE     Status: None   Collection Time    07/22/12 10:50 AM      Result Value Range Status   Specimen Description BRONCHIAL ALVEOLAR LAVAGE   Final   Special Requests NONE   Final   Gram Stain     Final   Value: RARE WBC PRESENT, PREDOMINANTLY PMN  RARE SQUAMOUS EPITHELIAL CELLS PRESENT     NO ORGANISMS SEEN   Colony Count 30,000 COLONIES/ML   Final   Culture Non-Pathogenic Oropharyngeal-type Flora Isolated.   Final   Report Status 07/25/2012 FINAL   Final    Coagulation Studies: No results found for this basename: LABPROT, INR,  in the last 72 hours  Imaging: No results found.  Medications:  I have reviewed the patient's current medications. Scheduled: . albuterol  2.5 mg Nebulization TID  . antiseptic oral rinse  15 mL Mouth Rinse QID  . aspirin  81 mg Oral Daily  . chlorhexidine  15 mL Mouth Rinse BID  . furosemide  40 mg Intravenous Once  . heparin  1,000 Units Intracatheter Once  . heparin  1,000 Units Intracatheter Once  . heparin  1,000 Units Intracatheter Once  . heparin  1,000 Units Intracatheter Once  . heparin lock flush  500 Units Intravenous Once  . pantoprazole sodium  40 mg Per Tube Daily  . potassium chloride  40 mEq Per Tube TID  . pyridostigmine  39.6 mg Oral Q4H  . sodium chloride  10-40 mL Intracatheter Q12H    Assessment/Plan: Patient stable.  Will continue to follow clinically.  No adjustment in Mestinon or further TPE at this time.      LOS: 16 days   Thana Farr, MD [facility] [phone] 07/26/2012  1:09 PM

## 2012-07-26 NOTE — Progress Notes (Signed)
ANTICOAGULATION CONSULT NOTE - Follow Up Consult  Pharmacy Consult for heparin Indication: DVT  No Known Allergies  Patient Measurements: Height: 6' (182.9 cm) Weight: 175 lb 0.7 oz (79.4 kg) IBW/kg (Calculated) : 77.6 Heparin Dosing Weight: 79.4 kg  Vital Signs: Temp: 98.6 F (37 C) (02/15 0817) Temp src: Oral (02/15 0817) BP: 100/60 mmHg (02/15 0817) Pulse Rate: 60 (02/15 1119)  Labs:  Recent Labs  07/24/12 0420 07/24/12 0820  07/25/12 0500 07/25/12 1200 07/26/12 0154 07/26/12 0500 07/26/12 0937  HGB 12.0*  --   --  11.8*  --   --  12.4*  --   HCT 36.0*  --   --  34.9*  --   --  37.0*  --   PLT 232  --   --  201  --   --  202  --   HEPARINUNFRC  --   --   < >  --  0.17* 0.22*  --  0.28*  CREATININE 0.51 0.53  --   --   --   --  0.58  --   < > = values in this interval not displayed.  Estimated Creatinine Clearance: 113.2 ml/min (by C-G formula based on Cr of 0.58).   Medications:  Scheduled:  . albuterol  2.5 mg Nebulization TID  . antiseptic oral rinse  15 mL Mouth Rinse QID  . aspirin  81 mg Oral Daily  . chlorhexidine  15 mL Mouth Rinse BID  . [EXPIRED] citrate dextrose      . [COMPLETED] furosemide  80 mg Intravenous Once  . heparin  1,000 Units Intracatheter Once  . heparin  1,000 Units Intracatheter Once  . heparin  1,000 Units Intracatheter Once  . heparin  1,000 Units Intracatheter Once  . heparin lock flush  500 Units Intravenous Once  . pantoprazole sodium  40 mg Per Tube Daily  . pyridostigmine  39.6 mg Oral Q4H  . sodium chloride  10-40 mL Intracatheter Q12H  . [DISCONTINUED] albuterol  2.5 mg Nebulization Q8H   Infusions:  . sodium chloride 20 mL/hr (07/25/12 0036)  . citrate dextrose 500 mL (07/25/12 0230)  . citrate dextrose 500 mL (07/22/12 1200)  . heparin 2,100 Units/hr (07/26/12 0700)  . [DISCONTINUED] heparin 1,750 Units/hr (07/25/12 1050)    Assessment: 56yom on heparin for LLE DVT (07/11/12). Heparin was resumed yesterday  after G-tube placement. Heparin level is still subtherapeutic (0.28) on 2100 units/hr. Will further increase rate.   CBC is stable. RN notes no overt bleeding but may have some blood tinged urine. She will call if further bleeding noted. No problems with infusion line per RN.  Goal of Therapy:  Heparin level 0.3-0.7 units/ml Monitor platelets by anticoagulation protocol: Yes   Plan:  Increase heparin drip to 2250 units/hr  F/u with heparin level  Lillia Pauls, PharmD Clinical Pharmacist Pager: [phone] Phone: [phone] 07/26/2012 11:42 AM

## 2012-07-27 ENCOUNTER — Inpatient Hospital Stay (HOSPITAL_COMMUNITY): Payer: Medicaid Other

## 2012-07-27 LAB — CBC
Platelets: 202 10*3/uL (ref 150–400)
RBC: 4.18 MIL/uL — ABNORMAL LOW (ref 4.22–5.81)
WBC: 7.9 10*3/uL (ref 4.0–10.5)

## 2012-07-27 LAB — HEPARIN LEVEL (UNFRACTIONATED)
Heparin Unfractionated: 0.91 IU/mL — ABNORMAL HIGH (ref 0.30–0.70)
Heparin Unfractionated: 0.91 IU/mL — ABNORMAL HIGH (ref 0.30–0.70)

## 2012-07-27 LAB — BASIC METABOLIC PANEL
CO2: 26 mEq/L (ref 19–32)
Calcium: 9.7 mg/dL (ref 8.4–10.5)
Chloride: 97 mEq/L (ref 96–112)
Sodium: 133 mEq/L — ABNORMAL LOW (ref 135–145)

## 2012-07-27 MED ORDER — PYRIDOSTIGMINE BROMIDE 60 MG/5ML PO SYRP
60.0000 mg | ORAL_SOLUTION | Freq: Two times a day (BID) | ORAL | Status: DC
  Administered 2012-07-27 – 2012-07-29 (×4): 60 mg via ORAL
  Filled 2012-07-27 (×6): qty 5

## 2012-07-27 MED ORDER — JEVITY 1.2 CAL PO LIQD
1000.0000 mL | ORAL | Status: DC
  Administered 2012-07-27: 1000 mL
  Filled 2012-07-27 (×3): qty 1000

## 2012-07-27 MED ORDER — PYRIDOSTIGMINE BROMIDE 60 MG/5ML PO SYRP
39.6000 mg | ORAL_SOLUTION | Freq: Four times a day (QID) | ORAL | Status: DC
  Administered 2012-07-27 – 2012-07-29 (×8): 39.6 mg via ORAL
  Filled 2012-07-27 (×12): qty 3.3

## 2012-07-27 MED ORDER — FUROSEMIDE 10 MG/ML IJ SOLN
40.0000 mg | Freq: Once | INTRAMUSCULAR | Status: AC
  Administered 2012-07-27: 40 mg via INTRAVENOUS
  Filled 2012-07-27: qty 4

## 2012-07-27 NOTE — Progress Notes (Signed)
ANTICOAGULATION CONSULT NOTE - Follow Up Consult  Pharmacy Consult for heparin Indication: DVT  No Known Allergies  Patient Measurements: Height: 6' (182.9 cm) Weight: 166 lb 7.2 oz (75.5 kg) IBW/kg (Calculated) : 77.6 Heparin Dosing Weight: 75.5 kg  Vital Signs: Temp: 97.9 F (36.6 C) (02/16 1200) Temp src: Oral (02/16 1200) BP: 97/58 mmHg (02/16 1200) Pulse Rate: 61 (02/16 1200)  Labs:  Recent Labs  07/25/12 0500  07/26/12 0500  07/26/12 1826 07/27/12 0215 07/27/12 0223 07/27/12 1147  HGB 11.8*  --  12.4*  --   --  12.6*  --   --   HCT 34.9*  --  37.0*  --   --  37.6*  --   --   PLT 201  --  202  --   --  202  --   --   HEPARINUNFRC  --   < >  --   < > 1.38*  --  0.91* 1.03*  CREATININE  --   --  0.58  --   --  0.55  --   --   < > = values in this interval not displayed.  Estimated Creatinine Clearance: 110.1 ml/min (by C-G formula based on Cr of 0.55).   Medications:  Scheduled:  . albuterol  2.5 mg Nebulization TID  . antiseptic oral rinse  15 mL Mouth Rinse QID  . aspirin  81 mg Oral Daily  . chlorhexidine  15 mL Mouth Rinse BID  . [COMPLETED] furosemide  40 mg Intravenous Once  . [COMPLETED] furosemide  40 mg Intravenous Once  . heparin  1,000 Units Intracatheter Once  . heparin  1,000 Units Intracatheter Once  . heparin  1,000 Units Intracatheter Once  . heparin  1,000 Units Intracatheter Once  . heparin lock flush  500 Units Intravenous Once  . pantoprazole sodium  40 mg Per Tube Daily  . [COMPLETED] potassium chloride  40 mEq Per Tube TID  . pyridostigmine  39.6 mg Oral QID  . pyridostigmine  60 mg Oral BID  . sodium chloride  10-40 mL Intracatheter Q12H  . [DISCONTINUED] pyridostigmine  39.6 mg Oral Q4H   Infusions:  . sodium chloride 20 mL/hr (07/25/12 0036)  . citrate dextrose 500 mL (07/25/12 0230)  . citrate dextrose 500 mL (07/22/12 1200)  . heparin 1,800 Units/hr (07/27/12 0903)  . [DISCONTINUED] heparin 2,250 Units/hr (07/26/12 1819)     Assessment: 57 yo male on heparin for DVT. Patient remains supratherapeutic (1.03) on heparin despite rate decreases. Will hold and further decrease rate. No bleeding noted and CBC is stable.  Goal of Therapy:  Heparin level 0.3-0.7 units/ml Monitor platelets by anticoagulation protocol: Yes   Plan:  1) Hold infusion for 1 hour  2) Resume at 1650 units/hr  3) 6 hour heparin level after gtt resumed  Mathis Fare 07/27/2012,1:03 PM

## 2012-07-27 NOTE — Progress Notes (Signed)
Subjective: Patient without complaints.  Reports no change.  Seen today about 30 minutes after Mestinon dosing.  Objective: Current vital signs: BP 103/57  Pulse 69  Temp(Src) 98.1 F (36.7 C) (Oral)  Resp 17  Ht 6' (1.829 m)  Wt 75.5 kg (166 lb 7.2 oz)  BMI 22.57 kg/m2  SpO2 99% Vital signs in last 24 hours: Temp:  [98.1 F (36.7 C)-100 F (37.8 C)] 98.1 F (36.7 C) (02/16 0800) Pulse Rate:  [60-125] 69 (02/16 0800) Resp:  [17-24] 17 (02/16 0800) BP: (87-103)/(50-57) 103/57 mmHg (02/16 0800) SpO2:  [95 %-100 %] 99 % (02/16 0815) FiO2 (%):  [40 %] 40 % (02/16 0815) Weight:  [75.5 kg (166 lb 7.2 oz)] 75.5 kg (166 lb 7.2 oz) (02/16 0426)  Intake/Output from previous day: 02/15 0701 - 02/16 0700 In: 913.5 [I.V.:913.5] Out: 1400 [Urine:1400] Intake/Output this shift: Total I/O In: 114 [I.V.:114] Out: 200 [Urine:200] Nutritional status: NPO  Neurologic Exam: Mental Status:  Alert. Able to follow 3 step commands without difficulty.  Cranial Nerves:  II: Discs flat bilaterally; Visual fields grossly normal, pupils equal, round, reactive to light and accommodation  III,IV, VI: Left ptosis wosrsened. Decreased right downward gaze  V,VII: smile symmetric, facial light touch sensation normal bilaterally  VIII: hearing normal bilaterally  IX,X: gag reflex not tested  XI: bilateral shoulder shrug  XII: midline tongue extension  Motor:  Able to lift all extremities against gravity.  Tone and bulk:normal tone throughout; no atrophy noted  Sensory: Pinprick and light touch intact throughout, bilaterally  Deep Tendon Reflexes: 2+ and symmetric throughout  Plantars:  Right: downgoing Left: downgoing    Lab Results: Basic Metabolic Panel:  Recent Labs Lab 07/22/12 0634 07/22/12 1200 07/23/12 0439 07/24/12 0420 07/24/12 0820 07/26/12 0500 07/27/12 0215  NA 138 140 141  --  140 135 133*  K 3.9 3.6 3.7  --  3.6 3.3* 4.2  CL 100 101 106  --  102 96 97  CO2 26  --  24   --  23 25 26   GLUCOSE 98 97 90  --  80 93 88  BUN 23 24* 26*  --  26* 23 24*  CREATININE 0.68 1.00 0.61 0.51 0.53 0.58 0.55  CALCIUM 9.1  --  8.9  --  9.1 9.4 9.7    Liver Function Tests: No results found for this basename: AST, ALT, ALKPHOS, BILITOT, PROT, ALBUMIN,  in the last 168 hours No results found for this basename: LIPASE, AMYLASE,  in the last 168 hours No results found for this basename: AMMONIA,  in the last 168 hours  CBC:  Recent Labs Lab 07/22/12 0634  07/23/12 0435 07/24/12 0420 07/25/12 0500 07/26/12 0500 07/27/12 0215  WBC 7.1  --  8.4 8.5 8.4 8.4 7.9  NEUTROABS 4.7  --   --   --   --   --   --   HGB 12.8*  < > 12.4* 12.0* 11.8* 12.4* 12.6*  HCT 38.2*  < > 37.9* 36.0* 34.9* 37.0* 37.6*  MCV 90.1  --  90.9 90.7 89.5 88.7 90.0  PLT 217  --  202 232 201 202 202  < > = values in this interval not displayed.  Cardiac Enzymes: No results found for this basename: CKTOTAL, CKMB, CKMBINDEX, TROPONINI,  in the last 168 hours  Lipid Panel: No results found for this basename: CHOL, TRIG, HDL, CHOLHDL, VLDL, LDLCALC,  in the last 168 hours  CBG:  Recent Labs Lab 07/23/12  1517 07/24/12 0022 07/24/12 0413 07/24/12 0832 07/24/12 1241  GLUCAP 86 81 88 80 87    Microbiology: Results for orders placed during the hospital encounter of 07/10/12  CULTURE, BLOOD (ROUTINE X 2)     Status: None   Collection Time    07/10/12 11:42 PM      Result Value Range Status   Specimen Description BLOOD LH   Final   Special Requests     Final   Value: NONE BOTTLES DRAWN AEROBIC AND ANAEROBIC 2CC] BOTTLES DRAWN AEROBIC ONLY 2CC   Culture  Setup Time 07/11/2012 09:10   Final   Culture NO GROWTH 5 DAYS   Final   Report Status 07/17/2012 FINAL   Final  CULTURE, BLOOD (ROUTINE X 2)     Status: None   Collection Time    07/10/12 11:47 PM      Result Value Range Status   Specimen Description BLOOD LW   Final   Special Requests NONE BOTTLES DRAWN AEROBIC AND ANAEROBIC 3CC    Final   Culture  Setup Time 07/11/2012 09:10   Final   Culture NO GROWTH 5 DAYS   Final   Report Status 07/17/2012 FINAL   Final  MRSA PCR SCREENING     Status: None   Collection Time    07/11/12  3:35 AM      Result Value Range Status   MRSA by PCR NEGATIVE  NEGATIVE Final   Comment:            The GeneXpert MRSA Assay (FDA     approved for NASAL specimens     only), is one component of a     comprehensive MRSA colonization     surveillance program. It is not     intended to diagnose MRSA     infection nor to guide or     monitor treatment for     MRSA infections.  URINE CULTURE     Status: None   Collection Time    07/11/12  6:09 AM      Result Value Range Status   Specimen Description URINE, CATHETERIZED   Final   Special Requests NONE   Final   Culture  Setup Time 07/11/2012 09:18   Final   Colony Count NO GROWTH   Final   Culture NO GROWTH   Final   Report Status 07/12/2012 FINAL   Final  CULTURE, BLOOD (ROUTINE X 2)     Status: None   Collection Time    07/12/12  1:50 AM      Result Value Range Status   Specimen Description BLOOD LEFT HAND   Final   Special Requests BOTTLES DRAWN AEROBIC AND ANAEROBIC 2.5ML   Final   Culture  Setup Time 07/12/2012 16:20   Final   Culture NO GROWTH 5 DAYS   Final   Report Status 07/18/2012 FINAL   Final  CULTURE, BLOOD (ROUTINE X 2)     Status: None   Collection Time    07/12/12  2:00 AM      Result Value Range Status   Specimen Description BLOOD LEFT ARM   Final   Special Requests BOTTLES DRAWN AEROBIC AND ANAEROBIC   Final   Culture  Setup Time 07/12/2012 16:20   Final   Culture NO GROWTH 5 DAYS   Final   Report Status 07/18/2012 FINAL   Final  CULTURE, RESPIRATORY     Status: None   Collection Time  07/13/12  5:57 AM      Result Value Range Status   Specimen Description TRACHEAL ASPIRATE   Final   Special Requests NONE   Final   Gram Stain     Final   Value: FEW WBC PRESENT, PREDOMINANTLY PMN     RARE SQUAMOUS  EPITHELIAL CELLS PRESENT     NO ORGANISMS SEEN   Culture RARE CANDIDA ALBICANS   Final   Report Status 07/15/2012 FINAL   Final  MRSA PCR SCREENING     Status: None   Collection Time    07/16/12  3:54 PM      Result Value Range Status   MRSA by PCR NEGATIVE  NEGATIVE Final   Comment:            The GeneXpert MRSA Assay (FDA     approved for NASAL specimens     only), is one component of a     comprehensive MRSA colonization     surveillance program. It is not     intended to diagnose MRSA     infection nor to guide or     monitor treatment for     MRSA infections.  URINE CULTURE     Status: None   Collection Time    07/16/12  3:54 PM      Result Value Range Status   Specimen Description URINE, CATHETERIZED   Final   Special Requests NONE   Final   Culture  Setup Time 07/16/2012 16:54   Final   Colony Count NO GROWTH   Final   Culture NO GROWTH   Final   Report Status 07/17/2012 FINAL   Final  CULTURE, BAL-QUANTITATIVE     Status: None   Collection Time    07/18/12 12:41 PM      Result Value Range Status   Specimen Description BRONCHIAL ALVEOLAR LAVAGE   Final   Special Requests RLL   Final   Gram Stain     Final   Value: RARE WBC PRESENT, PREDOMINANTLY PMN     RARE SQUAMOUS EPITHELIAL CELLS PRESENT     NO ORGANISMS SEEN   Colony Count 5,000 COLONIES/ML   Final   Culture Non-Pathogenic Oropharyngeal-type Flora Isolated.   Final   Report Status 07/20/2012 FINAL   Final  FUNGUS CULTURE W SMEAR     Status: None   Collection Time    07/21/12  5:48 PM      Result Value Range Status   Specimen Description TRACHEAL ASPIRATE   Final   Special Requests NONE   Final   Fungal Smear NO YEAST OR FUNGAL ELEMENTS SEEN   Final   Culture YEAST ISOLATED;ID TO FOLLOW   Final   Report Status PENDING   Incomplete  AFB CULTURE WITH SMEAR     Status: None   Collection Time    07/21/12  5:49 PM      Result Value Range Status   Specimen Description TRACHEAL ASPIRATE   Final   Special  Requests NONE   Final   ACID FAST SMEAR NO ACID FAST BACILLI SEEN   Final   Culture     Final   Value: CULTURE WILL BE EXAMINED FOR 6 WEEKS BEFORE ISSUING A FINAL REPORT   Report Status PENDING   Incomplete  CULTURE, BAL-QUANTITATIVE     Status: None   Collection Time    07/22/12 10:50 AM      Result Value Range Status   Specimen Description BRONCHIAL ALVEOLAR  LAVAGE   Final   Special Requests NONE   Final   Gram Stain     Final   Value: RARE WBC PRESENT, PREDOMINANTLY PMN     RARE SQUAMOUS EPITHELIAL CELLS PRESENT     NO ORGANISMS SEEN   Colony Count 30,000 COLONIES/ML   Final   Culture Non-Pathogenic Oropharyngeal-type Flora Isolated.   Final   Report Status 07/25/2012 FINAL   Final  CLOSTRIDIUM DIFFICILE BY PCR     Status: None   Collection Time    07/26/12  3:22 PM      Result Value Range Status   C difficile by pcr NEGATIVE  NEGATIVE Final    Coagulation Studies: No results found for this basename: LABPROT, INR,  in the last 72 hours  Imaging: No results found.  Medications:  I have reviewed the patient's current medications. Scheduled: . albuterol  2.5 mg Nebulization TID  . antiseptic oral rinse  15 mL Mouth Rinse QID  . aspirin  81 mg Oral Daily  . chlorhexidine  15 mL Mouth Rinse BID  . heparin  1,000 Units Intracatheter Once  . heparin  1,000 Units Intracatheter Once  . heparin  1,000 Units Intracatheter Once  . heparin  1,000 Units Intracatheter Once  . heparin lock flush  500 Units Intravenous Once  . pantoprazole sodium  40 mg Per Tube Daily  . pyridostigmine  39.6 mg Oral QID  . pyridostigmine  60 mg Oral BID  . sodium chloride  10-40 mL Intracatheter Q12H    Assessment/Plan: MG patient with suboptimal results from IVIg and TPE.  On Mestinon.  Recommendations: 1.  Will increase Mestinon at 0800 and 1600 to 60 mg.  Will determine response and make further changes at that time.     LOS: 17 days   Thana Farr, MD [facility] [phone] 07/27/2012  11:01 AM

## 2012-07-27 NOTE — Progress Notes (Signed)
ANTICOAGULATION CONSULT NOTE - Follow Up Consult  Pharmacy Consult for DVT Indication: DVT  No Known Allergies  Patient Measurements: Height: 6' (182.9 cm) Weight: 166 lb 7.2 oz (75.5 kg) IBW/kg (Calculated) : 77.6 Heparin Dosing Weight: 75.5 kg  Vital Signs: Temp: 97.6 F (36.4 C) (02/16 1939) Temp src: Oral (02/16 1939) BP: 110/65 mmHg (02/16 2017) Pulse Rate: 65 (02/16 2017)  Labs:  Recent Labs  07/25/12 0500  07/26/12 0500  07/27/12 0215 07/27/12 0223 07/27/12 1147 07/27/12 1958  HGB 11.8*  --  12.4*  --  12.6*  --   --   --   HCT 34.9*  --  37.0*  --  37.6*  --   --   --   PLT 201  --  202  --  202  --   --   --   HEPARINUNFRC  --   < >  --   < >  --  0.91* 1.03* 0.91*  CREATININE  --   --  0.58  --  0.55  --   --   --   < > = values in this interval not displayed.  Estimated Creatinine Clearance: 110.1 ml/min (by C-G formula based on Cr of 0.55).   Medications:  Scheduled:  . albuterol  2.5 mg Nebulization TID  . antiseptic oral rinse  15 mL Mouth Rinse QID  . aspirin  81 mg Oral Daily  . chlorhexidine  15 mL Mouth Rinse BID  . feeding supplement (JEVITY 1.2 CAL)  1,000 mL Per Tube Q24H  . [COMPLETED] furosemide  40 mg Intravenous Once  . heparin  1,000 Units Intracatheter Once  . heparin  1,000 Units Intracatheter Once  . heparin  1,000 Units Intracatheter Once  . heparin  1,000 Units Intracatheter Once  . heparin lock flush  500 Units Intravenous Once  . pantoprazole sodium  40 mg Per Tube Daily  . [COMPLETED] potassium chloride  40 mEq Per Tube TID  . pyridostigmine  39.6 mg Oral QID  . pyridostigmine  60 mg Oral BID  . sodium chloride  10-40 mL Intracatheter Q12H  . [DISCONTINUED] pyridostigmine  39.6 mg Oral Q4H   Infusions:  . sodium chloride 20 mL/hr (07/25/12 0036)  . citrate dextrose 500 mL (07/25/12 0230)  . citrate dextrose 500 mL (07/22/12 1200)  . heparin 1,650 Units/hr (07/27/12 2000)    Assessment: 57 yo male with DVT is  currently supratherapeutic heparin.  Heparin level is 0.91.  No problem with infusion per RN. Goal of Therapy:  Heparin level 0.3-0.7 units/ml Monitor platelets by anticoagulation protocol: Yes   Plan:  1) Reduce heparin drip to 1500 units/hr 2) Check a 6hr heparin level  Kyndall Amero, Tsz-Yin 07/27/2012,8:43 PM

## 2012-07-27 NOTE — Progress Notes (Signed)
ANTICOAGULATION CONSULT NOTE - Follow Up Consult  Pharmacy Consult for heparin Indication: DVT  Labs:  Recent Labs  07/24/12 0820  07/25/12 0500  07/26/12 0500 07/26/12 0937 07/26/12 1826 07/27/12 0215 07/27/12 0223  HGB  --   < > 11.8*  --  12.4*  --   --  12.6*  --   HCT  --   --  34.9*  --  37.0*  --   --  37.6*  --   PLT  --   --  201  --  202  --   --  202  --   HEPARINUNFRC  --   < >  --   < >  --  0.28* 1.38*  --  0.91*  CREATININE 0.53  --   --   --  0.58  --   --  0.55  --   < > = values in this interval not displayed.   Assessment: 57yo male remains supratherapeutic on heparin after rate decreases though getting closer to goal.  Goal of Therapy:  Heparin level 0.3-0.7 units/ml   Plan:  Will decrease heparin gtt by 2 units/kg/hr to 1800 units/hr which is the rate at which pt was therapeutic earlier in admission and check level in 6hr.  Colleen Can PharmD BCPS 07/27/2012,6:03 AM

## 2012-07-27 NOTE — Plan of Care (Signed)
Problem: Phase I Progression Outcomes Goal: Patient tolerating weaning plan Outcome: Progressing Pt has been spending longer amounts of time on TC- RT weaned pt down to 28% trach collar today 2/16

## 2012-07-27 NOTE — Progress Notes (Addendum)
1130-Paged dietary ([phone]-number given to me by the operator) 4x and never received a page back.  Dr. Shelle Iron made aware. Will continue to try to call for dietary.  Also-ok for pt to have ice chips.  1355- got ahold of dietary, they will put orders in.  Salomon Mast, RN

## 2012-07-27 NOTE — Progress Notes (Signed)
Brief Nutrition Note  Consult received for enteral/tube feeding initiation and management.  Patient with new PEG tube.   Adult Enteral Nutrition Protocol initiated. Full assessment to follow.  Admitting Dx: Cardiac arrest [427.5] Acute respiratory failure [518.81] Respiratory failure [518.81] Dysphagia [787.20] Aspiration into lower respiratory tract [934.9]  Body mass index is 22.57 kg/(m^2). Pt meets criteria for normal weight based on current BMI.  Labs:   Recent Labs Lab 07/24/12 0820 07/26/12 0500 07/27/12 0215  NA 140 135 133*  K 3.6 3.3* 4.2  CL 102 96 97  CO2 23 25 26   BUN 26* 23 24*  CREATININE 0.53 0.58 0.55  CALCIUM 9.1 9.4 9.7  GLUCOSE 80 93 88    Jonna Dittrich, RD, LDN Pager #: [phone] After-Hours Pager #: [phone]

## 2012-07-27 NOTE — Progress Notes (Signed)
PULMONARY  / CRITICAL CARE MEDICINE  Name: Victor Durham MRN: [id number] DOB: [birth year]    ADMISSION DATE:  07/10/2012 CONSULTATION DATE:  1/30/20143  REFERRING MD :  Juleen China  CHIEF COMPLAINT:  Dysphagia  BRIEF PATIENT DESCRIPTION: 57 y/o homeless male presented to the [facility] ED On 1/30 for the third time in two weeks for evaluation of dysphagia.  He complained of some difficulty breathing and underwent a neck CT.  Shortly after that procedure he developed respiratory and cardiac arrest.  PCCM called for admission.  SIGNIFICANT EVENTS / STUDIES:  1/30 respiratory and cardiac arrest in ED 1/30 CT head: hypoattenuation R midbrain extending into L pons, poorly characterized due to streak artifact 130 Echocardiogram: LVEF normal. Mild to moderate RV dilatation 1/31 EGD gastritis and esophagitis but no mass or bleeding source 1/31 LE venous duplex scans: LLE DVT 1/31 Thick secretions -plugged ETT during MRI 1/31 MRI -Scattered subcortical T2 hyperintensities -nonspecific, chronic microvascular ischemia vs a demyelinating process  2/1 Neurology consult: concern for myasthenia gravis 2/1 CT chest - no thymoma or central PE, Bilateral lower lobe consolidation and atelectasis , Nonspecific enhancing solid 4.1 cm mass exophytic from segment 2 of the left hepatic lobe  2/2 Mestinon started - intolerant due to excessive secretions 2/3 IVIG initiated by Neuro 2/4 Acetylcholine Receptor Ab 127.00 (<=0.30 mmol/L) 2/4 MRI cervical spine >> normal 2/4 Mestinon retried at a decreased dose 2-4 tx to cone. 2-7 Bronchoscopy 2/9- extubated 2/10 re intubated 2/11 trach 2/13- peg  LINES / TUBES: 1/30 ETT >> 2/9 1/30 L [place] CVL >> 2/5  RtI J HD Cath insertion>>   CULTURES: 1/30 blood >> NEG 1/30 urine >> NEG 2-2 sputum>> NEG 2-1 bc x 2 >> NEG 2-7 BAL >> oral flora 2-11 BAL >>  ANTIBIOTICS: unasyn 1/30 >> 2/4 levaquin 1/30 >> 2/4  SUBJECTIVE:  S/p G-tube placement 2/14.  Last plasmapheresis  2/13.   ATC during day, mandatory vent at night. Currently with no increased wob, and he denies significant secretions that he suctions himself. Nutrition referral placed to start enteral nutrition. +low grade temp overnight, but now ok.   VITAL SIGNS: Temp:  [98.1 F (36.7 C)-100 F (37.8 C)] 98.1 F (36.7 C) (02/16 0800) Pulse Rate:  [60-125] 69 (02/16 0800) Resp:  [17-24] 17 (02/16 0800) BP: (87-103)/(50-57) 103/57 mmHg (02/16 0800) SpO2:  [95 %-100 %] 99 % (02/16 0815) FiO2 (%):  [40 %] 40 % (02/16 0815) Weight:  [75.5 kg (166 lb 7.2 oz)] 75.5 kg (166 lb 7.2 oz) (02/16 0426) VENTILATOR SETTINGS: Vent Mode:  [-] PRVC FiO2 (%):  [40 %] 40 % Set Rate:  [18 bmp] 18 bmp Vt Set:  [550 mL] 550 mL PEEP:  [5 cmH20] 5 cmH20 Plateau Pressure:  [15 cmH20-17 cmH20] 17 cmH20 INTAKE / OUTPUT: Intake/Output     02/15 0701 - 02/16 0700 02/16 0701 - 02/17 0700   I.V. (mL/kg) 913.5 (12.1) 114 (1.5)   Total Intake(mL/kg) 913.5 (12.1) 114 (1.5)   Urine (mL/kg/hr) 1400 (0.8) 200 (0.6)   Total Output 1400 200   Net -486.5 -86        Stool Occurrence 1 x      PHYSICAL EXAMINATION: Gen: tired but alert, no acute distress HEENT: NG in place, trach site clean PULM: coarse breath sounds bilaterally, a few scattered crackles, no wheezing CV: s1s2 regular, no murmur AB: Soft, non tender Ext: trace symmetric non-pitting edema Neuro: moves extremities, follows commands  LABS:  Recent Labs Lab 07/21/12  1400 07/21/12 1443  07/24/12 0820 07/25/12 0500 07/26/12 0500 07/27/12 0215  HGB  --   --   < >  --  11.8* 12.4* 12.6*  WBC  --   --   < >  --  8.4 8.4 7.9  PLT  --   --   < >  --  201 202 202  NA  --   --   < > 140  --  135 133*  K  --   --   < > 3.6  --  3.3* 4.2  CL  --   --   < > 102  --  96 97  CO2  --   --   < > 23  --  25 26  GLUCOSE  --   --   < > 80  --  93 88  BUN  --   --   < > 26*  --  23 24*  CREATININE  --   --   < > 0.53  --  0.58 0.55  CALCIUM  --   --   < > 9.1  --   9.4 9.7  APTT 38*  --   --   --   --   --   --   INR 1.33  --   --   --   --   --   --   PHART  --  7.445  --   --   --   --   --   PCO2ART  --  34.2*  --   --   --   --   --   PO2ART  --  85.0  --   --   --   --   --   < > = values in this interval not displayed.  Recent Labs Lab 07/23/12 1517 07/24/12 0022 07/24/12 0413 07/24/12 0832 07/24/12 1241  GLUCAP 86 81 88 80 87    CXR: No results found.  ASSESSMENT / PLAN:  PULMONARY A: Acute respiratory failure due to Myasthenia Gravis, and Aspiration PNA, dramatic reintubated required 2/10, trach placed 2/11 P:   Trach collar day prior fatigued at 6 hrs, re attempt, needs noctunral mandatory vent for now; goal wean vent time down to ATC 24x7 Neg balance as goal >>diuresing daily as tolerated. See neuro Attempt VC, NIF daily   CARDIOVASCULAR A: LLE DVT P:  Heparin gtt ASA 81mg  tele  RENAL A:  Volume overload. Mild hypokalemia P:   Negative fluid balance as tolerated >> dosing lasix daily F/u renal fx, K supp as needed  GASTROINTESTINAL A:  Dysphagia due to MG. Nutrition. P: Gtube placed, and awaiting nutrition evaluation for enteral feeds.   HEMATOLOGIC A: Anemia of critical illness. Thrombocytopenia >> resolved P:  Heparin back on after g-tube placement Cbc in am   INFECTIOUS A:  Aspiration PNA. Completed Abx 2/04. Had low grade temp last night, but now ok.  P:   Monitor clinically f/u BAL rll, rml with trach >> no signif organism isolated.  Low threshold to start ABX if spikes > will check cxr in am    ENDOCRINE A:  Hyperglycemia. P:   cbgs q4 as NPO  NEUROLOGIC A: Myasthenia Gravis Anxiety P:   S/p IVIG mestinon  plasma exchange per neurology, likely needs total of 5 doses, last dose 2/13 Trach in place g-tube PT/OT consult Psych consult placed   Stay on pccm, to sdu vent bed Family contact:  Faizan Laughner [name] [phone] - 2008; back up contact for father: [name]:  [phone] Consented father, all risks, explained, death, bleeding, ptx, infection

## 2012-07-28 DIAGNOSIS — Z930 Tracheostomy status: Secondary | ICD-10-CM

## 2012-07-28 LAB — CBC
MCHC: 34.3 g/dL (ref 30.0–36.0)
MCV: 88.9 fL (ref 78.0–100.0)
Platelets: 189 10*3/uL (ref 150–400)
RDW: 14.9 % (ref 11.5–15.5)
WBC: 6.6 10*3/uL (ref 4.0–10.5)

## 2012-07-28 LAB — BASIC METABOLIC PANEL
CO2: 24 mEq/L (ref 19–32)
Calcium: 9.6 mg/dL (ref 8.4–10.5)
Creatinine, Ser: 0.53 mg/dL (ref 0.50–1.35)

## 2012-07-28 LAB — HEPARIN LEVEL (UNFRACTIONATED): Heparin Unfractionated: 0.49 IU/mL (ref 0.30–0.70)

## 2012-07-28 MED ORDER — WARFARIN SODIUM 5 MG PO TABS
5.0000 mg | ORAL_TABLET | Freq: Once | ORAL | Status: AC
  Administered 2012-07-28: 5 mg via ORAL
  Filled 2012-07-28 (×2): qty 1

## 2012-07-28 MED ORDER — PATIENT'S GUIDE TO USING COUMADIN BOOK
Freq: Once | Status: AC
  Administered 2012-07-28: 15:00:00
  Filled 2012-07-28 (×2): qty 1

## 2012-07-28 MED ORDER — WARFARIN - PHARMACIST DOSING INPATIENT: Freq: Every day | Status: DC

## 2012-07-28 MED ORDER — JEVITY 1.2 CAL PO LIQD
1000.0000 mL | ORAL | Status: DC
  Administered 2012-07-28 – 2012-08-01 (×6): 1000 mL
  Administered 2012-08-02: 15:00:00
  Administered 2012-08-03: 1000 mL
  Administered 2012-08-03: 09:00:00
  Administered 2012-08-04 – 2012-08-05 (×2): 1000 mL
  Administered 2012-08-06: 04:00:00
  Filled 2012-07-28 (×19): qty 1000

## 2012-07-28 NOTE — Progress Notes (Signed)
Unable to removed central line at this time.  Pt refused to get back into bed and said "I will later."

## 2012-07-28 NOTE — Progress Notes (Signed)
ANTICOAGULATION CONSULT NOTE - Follow Up Consult  Pharmacy Consult for heparin Indication: DVT  Labs:  Recent Labs  07/26/12 0500  07/27/12 0215  07/27/12 1147 07/27/12 1958 07/28/12 0440  HGB 12.4*  --  12.6*  --   --   --  12.9*  HCT 37.0*  --  37.6*  --   --   --  37.6*  PLT 202  --  202  --   --   --  189  HEPARINUNFRC  --   < >  --   < > 1.03* 0.91* 0.49  CREATININE 0.58  --  0.55  --   --   --   --   < > = values in this interval not displayed.   Assessment/Plan:  57yo male now therapeutic on heparin after difficulty achieving goal after resuming.  Will continue gtt at current rate and confirm stable with additional level.  Colleen Can PharmD BCPS 07/28/2012,5:12 AM

## 2012-07-28 NOTE — Consult Note (Signed)
Patient Identification:  Victor Durham Date of Evaluation:  07/28/2012 Reason for Consult:  Tracheostomy  Referring Provider: Dr. Elwyn Reach  History of Present Illness: Pt had presernted to [place] ED 3 times in two weeks. He c/o dysphagia. He had difficulty breathing and had a CT. After that procedure, he developed respiratory and cardiac arrest. He was admitted.   Past Psychiatric History: Pt/has a trach and only writes. His father says he was never normal He has problems with his siblings. He married and wife filed for divorce. He tried to work and eventually became homeless. He denies ever seeing a psychiatrist, taking medications, feeling suicidal, hearing voices, seeing 'things', taking alcohol [no] cannabis [yes] crack cocaine [no]. His father says there were situations where his son [pt] thought he was being follow - expressed dominant paranoid thoughts.   Past Medical History:     Past Medical History  Diagnosis Date  . DVT (deep venous thrombosis)   . Hypertension   . High cholesterol        Past Surgical History  Procedure Laterality Date  . Esophagogastroduodenoscopy  07/11/2012    Procedure: ESOPHAGOGASTRODUODENOSCOPY (EGD);  Surgeon: Shirley Friar, MD;  Location: [facility];  Service: Endoscopy;  Laterality: N/A;  BEDSIDE    Allergies: No Known Allergies  Current Medications:  Prior to Admission medications   Medication Sig Start Date End Date Taking? Authorizing Provider  aspirin 81 MG chewable tablet Chew 81 mg by mouth every morning.    Yes Historical Provider, MD    Social History:    reports that he has never smoked. He does not have any smokeless tobacco history on file. He reports that he does not drink alcohol or use illicit drugs.   Family History:    History reviewed. No pertinent family history.  Mental Status Examination/Evaluation: Objective:  Appearance: Underweight  Eye Contact::  Eyes closed but awake   Speech:  intubated, declines to  write responses  Volume: NA   Mood:  Withdrawn   Affect:  Blunt  Thought Process:  Withdrawn  Orientation:  Full (Time, Place, and Person)  Thought Content:  Focused on returning home-unspecified  Suicidal Thoughts:  No  Homicidal Thoughts:  No  Judgement:  Impaired  Insight:  Lacking   DIAGNOSIS:   AXIS I  schizoaffective disorder,   AXIS II  Deferred  AXIS III See medical notes.  AXIS IV housing problems and problems with primary support group  AXIS V 41-50 serious symptoms   Assessment/Plan: Paged Dr. Elwyn Reach,  Psych CSW note appreciated. Call to Mr. Schubert [father] [phone] Patient struggles with the complications of myasthenia gravis and is not yet gleaned from tracheostomy He appears weary and upon entering the room turns his head and closes his eyes. It is apparent from Friday and today he is focused on going home, although he does not choose to specify where that is. He only indicates he has enough money to find a place.   Although his father reports that his son (patient) "is not right" he does not provide specific reasons.  Attempt to call father for his explanations and he does not answer. (This is not to provide information about the patient).  Per CSW note patient denies paranoia. RECOMMENDATION:  1. Patient needs followup, given he is able to wean from the tracheostomy, with medical care of myasthenia gravis. 2.  Patient declines any referral to psychiatric care and most likely will not followup even if it's provided. 3.  Patient's  PCP may be notified to provide any future psychiatric referral. 4.  Discussed patient with Dr. Elwyn Reach at a later meeting. 5. No further psychiatric needs identified unless requested. M.D. psychiatrist signs off Mickeal Skinner MD 07/28/2012 3:17 PM

## 2012-07-28 NOTE — Progress Notes (Signed)
Clinical Child psychotherapist staffed case with Chiropodist.  CSW to continue to follow and assist as needed.    [name], MSW, [place] [phone]

## 2012-07-28 NOTE — Progress Notes (Signed)
NEURO HOSPITALIST PROGRESS NOTE   SUBJECTIVE:                                                                                                                        No complaints. Patient received increased dose of Mestinon today at 08:30. He states he did not feel any significant difference in his vision, any change in oral secretions or strength.   OBJECTIVE:                                                                                                                           Vital signs in last 24 hours: Temp:  [97.6 F (36.4 C)-98.1 F (36.7 C)] 97.9 F (36.6 C) (02/17 0400) Pulse Rate:  [58-77] 77 (02/17 0821) Resp:  [13-26] 26 (02/17 0821) BP: (94-110)/(47-67) 108/59 mmHg (02/17 0821) SpO2:  [90 %-100 %] 100 % (02/17 0400) FiO2 (%):  [28 %-35 %] 28 % (02/17 0821) Weight:  [72 kg (158 lb 11.7 oz)] 72 kg (158 lb 11.7 oz) (02/17 0012)  Intake/Output from previous day: 02/16 0701 - 02/17 0700 In: 1252 [I.V.:712] Out: 1735 [Urine:1735] Intake/Output this shift:   Nutritional status: NPO  Past Medical History  Diagnosis Date  . DVT (deep venous thrombosis)   . Hypertension   . High cholesterol     Neurologic ROS negative with exception of above. Musculoskeletal ROS none  Neurologic Exam:  Mental Status: Alert, oriented, thought content appropriate.  Able to write his thoughts clearly on paper. No vocal output due to trach.  Able to follow 3 step commands without difficulty. Cranial Nerves: II:  Visual fields grossly normal with both eyes open.  When right eye is covered he cannot count fingers.  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 ) pupils equal, round, reactive to light and accommodation III,IV, VI: ptosis present bilaterally (right eyelid covers half of his iris and left eyelid covers all of his iris, extra-ocular motions intact bilaterally V,VII: smile asymmetric with resting left NL decrease,  facial light touch sensation normal bilaterally VIII: hearing normal bilaterally IX,X: gag reflex present XI: bilateral shoulder shrug XII: midline tongue extension Motor: Right : Upper extremity   5/5  Left:     Upper extremity   5/5  Lower extremity   5/5     Lower extremity   5/5 Tone and bulk:normal tone throughout; no atrophy noted Sensory: Pinprick and light touch intact throughout, bilaterally Deep Tendon Reflexes: 2+ and symmetric throughout Plantars: Right: downgoing   Left: downgoing Cerebellar: normal finger-to-nose,  normal heel-to-shin test CV: pulses palpable throughout    Lab Results: Lab Results  Component Value Date/Time   CHOL 223* 06/27/2009  8:22 PM   Lipid Panel No results found for this basename: CHOL, TRIG, HDL, CHOLHDL, VLDL, LDLCALC,  in the last 72 hours  Studies/Results: Dg Chest Port 1 View  07/27/2012  *RADIOLOGY REPORT*  Clinical Data: Cough and congestion.  PORTABLE CHEST - 1 VIEW  Comparison: 07/24/2012  Findings: The cardiomediastinal silhouette is unremarkable. A tracheostomy tube and a left subclavian central venous catheter with tip overlying the mid SVC again identified.  Right lower lung opacity Right lower lung opacity noted - question atelectasis versus pneumonia. There is no evidence of pneumothorax or definite pleural effusion. No acute bony abnormalities are noted.  IMPRESSION: Right lower lung opacity - question pneumonia versus atelectasis.   Original Report Authenticated By: Harmon Pier, M.D.     MEDICATIONS                                                                                                                        Scheduled: . albuterol  2.5 mg Nebulization TID  . antiseptic oral rinse  15 mL Mouth Rinse QID  . aspirin  81 mg Oral Daily  . chlorhexidine  15 mL Mouth Rinse BID  . feeding supplement (JEVITY 1.2 CAL)  1,000 mL Per Tube Q24H  . heparin  1,000 Units Intracatheter Once  . heparin  1,000 Units Intracatheter  Once  . heparin  1,000 Units Intracatheter Once  . heparin  1,000 Units Intracatheter Once  . heparin lock flush  500 Units Intravenous Once  . pantoprazole sodium  40 mg Per Tube Daily  . pyridostigmine  39.6 mg Oral QID  . pyridostigmine  60 mg Oral BID  . sodium chloride  10-40 mL Intracatheter Q12H    ASSESSMENT/PLAN:                                                                                                           Patient with MG with suboptimal results from IVIg and PLX.  Now on Mestinon. Today increased 0800 dose of mestinon to 60 mg. Patient received this dose at 8:30  am and seen at 9:30 by myself.  No significant change was noted.  His right eye showed possibly a slight decrease in his right ptosis. Will assess again and possibly increase dose of Mestinon again.   Assessment and plan discussed with with attending physician and they are in agreement.    Felicie Morn PA-C [facility] [phone]  07/28/2012, 8:54 AM

## 2012-07-28 NOTE — Progress Notes (Signed)
NUTRITION FOLLOW UP  Intervention:   1.  Enteral nutrition; continue with Jevity 1.2.  Advance by 10 mL q 4 hrs to 70 mL/hr goal to provide 2016 kcal, 93g protein, 1377 mL free water.  Nutrition Dx:   Inadequate oral intake, ongoing.  Monitor:   1. Enteral nutrition; advancements with tolerance.  Victor Durham meeting >/=90% estimated needs.    Assessment:   Victor Durham admitted with dysphagia. Developed cardiac and respiratory arrest in the ED. Victor Durham required several days of ventilator support and now has trach in place.  Currently on trach collar.   Victor Durham has been dx with myasthenias gravis and has been undergoing plasmapheresis.     Victor Durham was maintained on Promote @ 75 mL/hr continuous prior to PEG placement.  Adult Enteral Protocol was initiated over the weekend and Victor Durham was placed on Jevity 1.2.  Victor Durham currently @ 40 mL/hr providing 1152 kcal, 53g protein, 787 mL free water. Residuals: 0 mL q 4 hrs  +BMs.  Victor Durham able to communicate tolerance of TFs with no nausea, vomiting, and +BMs.  No questions or concerns at this time.   Victor Durham followed by SLP- last assessed (2/14) for PMSV trials.  RD to follow.   Height: Ht Readings from Last 1 Encounters:  07/22/12 6' (1.829 m)    Weight Status:   Wt Readings from Last 1 Encounters:  07/28/12 158 lb 11.7 oz (72 kg)    Re-estimated needs:  Kcal: 2010-2250 Protein: 81-97g Fluid: >2.0 L/day  Skin: intact, some non-pitting edema  Diet Order: NPO   Intake/Output Summary (Last 24 hours) at 07/28/12 1005 Last data filed at 07/28/12 1000  Gross per 24 hour  Intake   1438 ml  Output   1535 ml  Net    -97 ml    Last BM: 2/15   Labs:   Recent Labs Lab 07/26/12 0500 07/27/12 0215 07/28/12 0440  NA 135 133* 138  K 3.3* 4.2 3.7  CL 96 97 101  CO2 25 26 24   BUN 23 24* 27*  CREATININE 0.58 0.55 0.53  CALCIUM 9.4 9.7 9.6  GLUCOSE 93 88 121*    CBG (last 3)  No results found for this basename: GLUCAP,  in the last 72 hours  Scheduled Meds: . albuterol   2.5 mg Nebulization TID  . antiseptic oral rinse  15 mL Mouth Rinse QID  . aspirin  81 mg Oral Daily  . chlorhexidine  15 mL Mouth Rinse BID  . feeding supplement (JEVITY 1.2 CAL)  1,000 mL Per Tube Q24H  . heparin  1,000 Units Intracatheter Once  . heparin  1,000 Units Intracatheter Once  . heparin  1,000 Units Intracatheter Once  . heparin  1,000 Units Intracatheter Once  . heparin lock flush  500 Units Intravenous Once  . pantoprazole sodium  40 mg Per Tube Daily  . pyridostigmine  39.6 mg Oral QID  . pyridostigmine  60 mg Oral BID  . sodium chloride  10-40 mL Intracatheter Q12H    Continuous Infusions: . sodium chloride 20 mL/hr at 07/28/12 0454  . citrate dextrose 500 mL (07/25/12 0230)  . citrate dextrose 500 mL (07/22/12 1200)  . heparin 1,500 Units/hr (07/28/12 0032)    Loyce Dys, MS RD LDN Clinical Inpatient Dietitian Pager: [phone] Weekend/After hours pager: [phone]

## 2012-07-28 NOTE — Progress Notes (Signed)
ANTICOAGULATION CONSULT NOTE - Follow Up Consult  Pharmacy Consult for heparin Indication: DVT  Labs:  Recent Labs  07/26/12 0500  07/27/12 0215  07/27/12 1958 07/28/12 0440 07/28/12 1214  HGB 12.4*  --  12.6*  --   --  12.9*  --   HCT 37.0*  --  37.6*  --   --  37.6*  --   PLT 202  --  202  --   --  189  --   HEPARINUNFRC  --   < >  --   < > 0.91* 0.49 0.40  CREATININE 0.58  --  0.55  --   --  0.53  --   < > = values in this interval not displayed.   Assessment:  57yo male now therapeutic on heparin after difficulty achieving goal after resuming.  Level therapeutic. Starting coumadin today. Baseline INR = 1.3  Goals:  Heparin level = 0.3-0.7 INR = 2-3  Monitoring platelets = yes  Plan  Cont heparin drip at 1500 units/hr Coumadin 5mg  PO x1 Daily INR

## 2012-07-28 NOTE — Progress Notes (Signed)
PULMONARY  / CRITICAL CARE MEDICINE  Name: Victor Durham MRN: [id number] DOB: [birth year]    ADMISSION DATE:  07/10/2012 CONSULTATION DATE:  1/30/20143  REFERRING MD :  Victor Durham  CHIEF COMPLAINT:  Dysphagia  BRIEF PATIENT DESCRIPTION: 57 y/o homeless male presented to the [facility] ED On 1/30 for the third time in two weeks for evaluation of dysphagia.  He complained of some difficulty breathing and underwent a neck CT.  Shortly after that procedure he developed respiratory and cardiac arrest.  PCCM called for admission.  SIGNIFICANT EVENTS / STUDIES:  1/30 respiratory and cardiac arrest in ED 1/30 CT head: hypoattenuation R midbrain extending into L pons, poorly characterized due to streak artifact 130 Echocardiogram: LVEF normal. Mild to moderate RV dilatation 1/31 EGD gastritis and esophagitis but no mass or bleeding source 1/31 LE venous duplex scans: LLE DVT - heparin started 1/31 Thick secretions -plugged ETT during MRI 1/31 MRI -Scattered subcortical T2 hyperintensities -nonspecific, chronic microvascular ischemia vs a demyelinating process  2/1 Neurology consult: concern for myasthenia gravis 2/1 CT chest - no thymoma or central PE, Bilateral lower lobe consolidation and atelectasis , Nonspecific enhancing solid 4.1 cm mass exophytic from segment 2 of the left hepatic lobe  2/2 Mestinon started - intolerant due to excessive secretions 2/3 IVIG initiated by Neuro 2/4 Acetylcholine Receptor Ab 127.00 (<=0.30 mmol/L) 2/4 MRI cervical spine >> normal 2/4 Mestinon retried at a decreased dose 2-4 tx to cone. 2-7 Bronchoscopy 2/9- extubated 2/10 re intubated 2/11 trach 2/13- peg 2/13 Last TPE session 2/17 Warfarin begun   LINES / TUBES: 1/30 ETT >> 2/9 1/30 L [place] CVL >> 2/17 2/5  RtI J HD Cath insertion>>  out  CULTURES: 1/30 blood >> NEG 1/30 urine >> NEG 2-2 sputum>> NEG 2-1 bc x 2 >> NEG 2-7 BAL >> oral flora 2-11 BAL >> NOF 2/15 C diff >> NEG  ANTIBIOTICS: unasyn  1/30 >> 2/4 levaquin 1/30 >> 2/4  SUBJECTIVE:  No new complaints. No distress  VITAL SIGNS: Temp:  [97.6 F (36.4 C)-98.2 F (36.8 C)] 98.2 F (36.8 C) (02/17 1516) Pulse Rate:  [58-93] 77 (02/17 1516) Resp:  [18-26] 18 (02/17 1516) BP: (95-110)/(55-67) 108/60 mmHg (02/17 1516) SpO2:  [96 %-100 %] 96 % (02/17 1516) FiO2 (%):  [28 %-30 %] 28 % (02/17 1516) Weight:  [72 kg (158 lb 11.7 oz)] 72 kg (158 lb 11.7 oz) (02/17 0012) VENTILATOR SETTINGS: Vent Mode:  [-] PRVC FiO2 (%):  [28 %-30 %] 28 % Set Rate:  [18 bmp] 18 bmp Vt Set:  [550 mL] 550 mL PEEP:  [5 cmH20] 5 cmH20 Plateau Pressure:  [13 cmH20-17 cmH20] 17 cmH20 INTAKE / OUTPUT: Intake/Output     02/16 0701 - 02/17 0700 02/17 0701 - 02/18 0700   I.V. (mL/kg) 747 (10.4) 290 (4)   Other 580 320   Total Intake(mL/kg) 1327 (18.4) 610 (8.5)   Urine (mL/kg/hr) 1735 (1) 50 (0.1)   Total Output 1735 50   Net -408 +560          PHYSICAL EXAMINATION: Gen: NAD HEENT: L>R lid lag PULM: no wheezing CV: regular, no murmur AB: Soft, non tender Ext: no edema Neuro: diffusely weak  LABS:  Recent Labs Lab 07/26/12 0500 07/27/12 0215 07/28/12 0440  HGB 12.4* 12.6* 12.9*  WBC 8.4 7.9 6.6  PLT 202 202 189  NA 135 133* 138  K 3.3* 4.2 3.7  CL 96 97 101  CO2 25 26 24   GLUCOSE 93  88 121*  BUN 23 24* 27*  CREATININE 0.58 0.55 0.53  CALCIUM 9.4 9.7 9.6    Recent Labs Lab 07/23/12 1517 07/24/12 0022 07/24/12 0413 07/24/12 0832 07/24/12 1241  GLUCAP 86 81 88 80 87    CXR: No new film  ASSESSMENT / PLAN:  PULMONARY A:  Acute respiratory failure due to Myasthenia Gravis Aspiration PNA Tracheostomy status Nocturnal vent dependence P:   Cont ATC during day and nocturnal vent for now  CARDIOVASCULAR A: LLE DVT P:  Cont Heparin gtt Begin warfarin    RENAL A:  Volume overload, resolved Mild hypokalemia, resolved P:   Monitor I/Os, BMET intermittently K supp as needed  GASTROINTESTINAL A:   Dysphagia due to MG.  S/P G tube  P:  TFs per nutrition   HEMATOLOGIC A: Anemia of critical illness. Thrombocytopenia >> resolved P:  Monitor CBC intermittently  INFECTIOUS A:  Aspiration PNA, treated Completed Abx 2/04.  P:   Cont to monitor  ENDOCRINE A:  Hyperglycemia, mild P:   Resume SSI for glu > 180  NEUROLOGIC A: Myasthenia Gravis Anxiety P:   Mgmt per Neuro  Family contact:  Victor Maler [name] [phone]; back up contact for father: Victor Durham: [phone] Consented father, all risks, explained, death, bleeding, ptx, infection  Victor Fischer, MD ; [facility] service Mobile [phone].  After 5:30 PM or weekends, call [phone]

## 2012-07-28 NOTE — Progress Notes (Signed)
Pt remains off vent at this time 1938. Documentation for vent check not available. Will place patient on vent at later time.

## 2012-07-28 NOTE — Progress Notes (Signed)
Passy-Muir Speaking Valve - Treatment Patient Details  Name: Victor Durham MRN: [id number] Date of Birth: 03-19-1956  Today's Date: 07/28/2012 Time: 4098-1191 SLP Time Calculation (min): 15 min  Past Medical History:  Past Medical History  Diagnosis Date  . DVT (deep venous thrombosis)   . Hypertension   . High cholesterol    Past Surgical History:  Past Surgical History  Procedure Laterality Date  . Esophagogastroduodenoscopy  07/11/2012    Procedure: ESOPHAGOGASTRODUODENOSCOPY (EGD);  Surgeon: Shirley Friar, MD;  Location: [facility];  Service: Endoscopy;  Laterality: N/A;  BEDSIDE    Assessment / Plan / Recommendation Clinical Impression  Pt. seen today for PMSV treatment sitting in chair with dad present.  Pt's with cuff appeared to be mildly inflated upon arrival which SLP slowly defalted resultig in initital mild cough and expectorated copious bloody mucous from trach.  Pt. continued to cough (mild effort) and expectorate bloody mucous which he was unable to clear with max verbal cues to take deep breaths prior to cough.  Sp02 to 88% briefly and given decreased ability to clear secretions, SLP reinflated cuff which led to increase in sats to 94% and ceasing of coughing episode.  SLP did not recommend RN to deep suction at this time due to bloody secretions, however did notify RN of session (RN aware of secretions).  Recommend SLP only attempt PMSV at this time and will continue efforts.       Plan  Continue with current plan of care    Follow Up Recommendations   (t be determined)    Pertinent Vitals/Pain Pt denied    SLP Goals Potential to Achieve Goals: Fair SLP Goal #1: Pt will tolerate PMSV with intermittent supervision during all waking hours SLP Goal #1 - Progress: Progressing toward goal   PMSV Trial  PMSV was placed for:  (not placed) Able to redirect subglottic air through upper airway:  (N/A) Able to Attain Phonation:  (N/A) Voice Quality:   (N/A) Able to Expectorate Secretions: Yes Level of Secretion Expectoration with PMSV: Tracheal Breath Support for Phonation: Severely decreased Respirations During Trial: 27 SpO2 During Trial: 100 % Pulse During Trial: 67 Behavior: Alert;Cooperative   Tracheostomy Tube  Additional Tracheostomy Tube Assessment Secretion Description:  (bloody) Level of Secretion Expectoration: Tracheal    Vent Dependency  Vent Mode: PRVC Set Rate: 18 bmp PEEP: 5 cmH20 FiO2 (%): 28 % Vt Set: 550 mL    Cuff Deflation Trial       Tolerated Cuff Deflation: No Length of Time for Cuff Deflation Trial: 5-8 min Behavior: Alert;Cooperative Cuff Deflation Trial - Comments:  (continuous cough bloody secretions)   Victor Durham Victor Durham.Ed [employer] [phone]  07/28/2012

## 2012-07-28 NOTE — Progress Notes (Signed)
Clinical Social Work Progress Note PSYCHIATRY SERVICE LINE 07/28/2012  Patient:  Victor Durham  Account:  [id number]  Admit Date:  07/10/2012  Clinical Social Worker:  Unk Lightning, LCSW  Date/Time:  07/28/2012 12:00 N  Review of Patient  Overall Medical Condition:   Patient reports he is feeling better and wants to go home.   Participation Level:  Minimal  Participation Quality  Guarded   Other Participation Quality:   Patient cannot talk but shakes his head and writes answers on pad. Patient gives brief answers and does not elaborate unless questioned.   Affect  Flat   Cognitive  Alert   Reaction to Medications/Concerns:   None reported   Modes of Intervention  Support  Behaviors/Psychosis   Summary of Progress/Plan at Discharge   CSW met with patient at bedside. Patient agreeable to session and turned tv off to talk with CSW. Patient reports he remembers CSW from previous assessment.    Patient reports he is feeling better and wants to go home. Patient is unable to state where "home" would actually be. Patient reports family has come to visit him. CSW informed patient that dad spoke with CSW and worried about patient's mental health. CSW explained that dad reported that patient felt others were talking about him and felt the police were following him. Patient denied these remarks and denied any other psychotic features. Patient denies paranoid thoughts and denies and AH or VH. Patient denies any depression or anxiety and reports he just wants to dc.    Patient flat throughout assessment but denies any paranoid thoughts. Patient's denies any MH diagnosis and refusing any treatment. If patient is denying MH concerns, it would be unlikely that he would benefit from outpatient follow up due to not being compliant with treatment. CSW will staff case with psych MD and will continue to follow.

## 2012-07-28 NOTE — Progress Notes (Signed)
Physical Therapy Treatment Patient Details Name: Victor Durham MRN: [id number] DOB: 1955-06-18 Today's Date: 07/28/2012 Time: 9562-1308 PT Time Calculation (min): 25 min  PT Assessment / Plan / Recommendation Comments on Treatment Session  Pt admitted with cardiac arrest, myasthenia gravis, PEG, trach and progressing with mobility. 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 Cervical MRI negative and discussed with neurology who has no intervention and stated they would defer any bracing options to primary team. Pt limited with advancing mobilty due to posterior lean to maintain neck extension. Pt encouraged to continue mobility with nursing as well as HEP. pt also encouraged to work on holding head up unassisted as he is able to perform for short periods of time. Will follow.     Follow Up Recommendations        Does the patient have the potential to tolerate intense rehabilitation     Barriers to Discharge        Equipment Recommendations       Recommendations for Other Services    Frequency     Plan Discharge plan remains appropriate;Frequency remains appropriate    Precautions / Restrictions Precautions Precautions: Fall Precaution Comments: pt reports unable to extend neck actively and has to hold it up with hand   Pertinent Vitals/Pain No pain sats 97% on 28% trach collar HR 93    Mobility  Bed Mobility Bed Mobility: Rolling Left;Left Sidelying to Sit;Sitting - Scoot to Edge of Bed Rolling Left: 4: Min assist;With rail Left Sidelying to Sit: 4: Min assist;HOB flat;With rails Sitting - Scoot to Edge of Bed: 4: Min guard Details for Bed Mobility Assistance: cueing for sequence and technique with use of rail to roll and transfer to sitting.  Transfers Transfers: Sit to Stand;Stand to Sit;Stand Pivot Transfers Sit to Stand: 1: +2 Total assist;From bed;From elevated surface Sit to Stand: Patient  Percentage: 60% Stand to Sit: 1: +2 Total assist;To chair/3-in-1 Stand to Sit: Patient Percentage: 70% Stand Pivot Transfers: 1: +2 Total assist Stand Pivot Transfers: Patient Percentage: 70% Details for Transfer Assistance: pt with cues for hand placement. Pt did not require knees blocked with transfers but support at trunk due to pt maintaining posterior lean to keep neck in extension as he reports he can't hold it up otherwise. Pt able to stand and advance legs to chair with trunk support and stand an additional 30sec prior to sitting but denied attempt at walking due to neck weakness.  Ambulation/Gait Ambulation/Gait Assistance: Not tested (comment)    Exercises General Exercises - Lower Extremity Long Arc Quad: AROM;Both;20 reps;Seated Hip Flexion/Marching: AROM;Both;20 reps;Seated Heel Raises: AROM;Both;20 reps;Seated   PT Diagnosis:    PT Problem List:   PT Treatment Interventions:     PT Goals Acute Rehab PT Goals PT Goal: Supine/Side to Sit - Progress: Progressing toward goal Pt will Sit at [facility] of Bed: with modified independence;3-5 min PT Goal: Sit at Edge Of Bed - Progress: Updated due to goal met PT Goal: Sit to Stand - Progress: Progressing toward goal PT Transfer Goal: Bed to Chair/Chair to Bed - Progress: Progressing toward goal PT Goal: Ambulate - Progress: Progressing toward goal PT Goal: Perform Home Exercise Program - Progress: Progressing toward goal  Visit Information  Last PT Received On: 07/28/12 Assistance Needed: +2    Subjective Data  Subjective: pt with trach mouthing and writing words   Cognition  Cognition Overall Cognitive Status:  Impaired Area of Impairment: Safety/judgement;Attention Arousal/Alertness: Awake/alert Orientation Level: Appears intact for tasks assessed Behavior During Session: [facility] for tasks performed Current Attention Level: Selective Attention - Other Comments: Pt at times will become distracted with thought he is writing on  notepad Following Commands: Follows one step commands consistently    Balance  Static Sitting Balance Static Sitting - Balance Support: No upper extremity supported;Feet supported Static Sitting - Level of Assistance: 5: Stand by assistance Static Sitting - Comment/# of Minutes: 7 pt with and without UE support. Pt frequently moving hands from surface to hold head due to flexion if not holding onto it and able to maintain neck extension grossly 15 sec at a time without support.   End of Session PT - End of Session Equipment Utilized During Treatment: Gait belt;Oxygen Activity Tolerance: Patient tolerated treatment well Patient left: in chair;with call bell/phone within reach;with nursing in room Nurse Communication: Mobility status   GP     Toney Sang [facility] 07/28/2012, 9:44 AM Delaney Meigs, PT [phone]

## 2012-07-28 NOTE — Progress Notes (Signed)
ANTICOAGULATION CONSULT NOTE - Follow Up Consult  Pharmacy Consult for heparin Indication: DVT  Labs:  Recent Labs  07/26/12 0500  07/27/12 0215  07/27/12 1958 07/28/12 0440 07/28/12 1214  HGB 12.4*  --  12.6*  --   --  12.9*  --   HCT 37.0*  --  37.6*  --   --  37.6*  --   PLT 202  --  202  --   --  189  --   HEPARINUNFRC  --   < >  --   < > 0.91* 0.49 0.40  CREATININE 0.58  --  0.55  --   --  0.53  --   < > = values in this interval not displayed.   Assessment/Plan:  57yo male now therapeutic on heparin after difficulty achieving goal after resuming.  Level therapeutic.  Cont heparin at current rate

## 2012-07-29 DIAGNOSIS — Z930 Tracheostomy status: Secondary | ICD-10-CM

## 2012-07-29 LAB — CBC
Hemoglobin: 12.9 g/dL — ABNORMAL LOW (ref 13.0–17.0)
MCH: 29.9 pg (ref 26.0–34.0)
MCV: 91.2 fL (ref 78.0–100.0)
RBC: 4.31 MIL/uL (ref 4.22–5.81)

## 2012-07-29 MED ORDER — PYRIDOSTIGMINE BROMIDE 60 MG/5ML PO SYRP
60.0000 mg | ORAL_SOLUTION | Freq: Three times a day (TID) | ORAL | Status: DC
  Administered 2012-07-29 – 2012-07-30 (×2): 60 mg via ORAL
  Filled 2012-07-29 (×5): qty 5

## 2012-07-29 MED ORDER — PYRIDOSTIGMINE BROMIDE 60 MG/5ML PO SYRP
39.6000 mg | ORAL_SOLUTION | Freq: Three times a day (TID) | ORAL | Status: DC
  Administered 2012-07-29 – 2012-07-30 (×3): 39.6 mg via ORAL
  Filled 2012-07-29 (×6): qty 3.3

## 2012-07-29 MED ORDER — WARFARIN SODIUM 5 MG PO TABS
5.0000 mg | ORAL_TABLET | Freq: Once | ORAL | Status: AC
  Administered 2012-07-29: 5 mg via ORAL
  Filled 2012-07-29: qty 1

## 2012-07-29 NOTE — Progress Notes (Signed)
Clinical Social Worker met with pt at bedside.  CSW provided active listening.  CSW reviewed that pt currently does not have any bed offers and SNF search will be expanded; pt agreeable.  CSW reviewed HCPOA paperwork with pt and stressed importance of having a person identified to make health care decisions in case pt is not able.  Pt stated understanding.  Pt again states his wife is deceased and would like all contact information taken off of his facesheet and leave only his father, Roverto Bodmer, Sr as the contact person.  CSW to continue to follow and assist as needed.   [name], MSW, [place] [phone]

## 2012-07-29 NOTE — Progress Notes (Signed)
NEURO HOSPITALIST PROGRESS NOTE   SUBJECTIVE:                                                                                                                        Patient feels very little difference with increase in mestinon. He does note his left eyelid seems to stay more open.   OBJECTIVE:                                                                                                                           Vital signs in last 24 hours: Temp:  [97.6 F (36.4 C)-98.3 F (36.8 C)] 98.3 F (36.8 C) (02/18 0340) Pulse Rate:  [52-93] 74 (02/18 0740) Resp:  [18-26] 18 (02/18 0600) BP: (94-111)/(55-73) 94/60 mmHg (02/18 0740) SpO2:  [94 %-100 %] 95 % (02/18 0740) FiO2 (%):  [28 %-30 %] 30 % (02/18 0740)  Intake/Output from previous day: 02/17 0701 - 02/18 0700 In: 1965 [I.V.:595] Out: 550 [Urine:550] Intake/Output this shift:   Nutritional status: NPO  Past Medical History  Diagnosis Date  . DVT (deep venous thrombosis)   . Hypertension   . High cholesterol     Neurologic ROS negative with exception of above. Musculoskeletal ROS left eye lid seems stronger  Neurologic Exam:   Mental Status:  Alert, oriented, thought content appropriate. Able to write his thoughts clearly on paper. No vocal output due to trach. Able to follow 3 step commands without difficulty.  Cranial Nerves:  II: Visual fields grossly normal with both eyes open. When right eye is covered he cannot count fingers. Pupils equal, round, reactive to light and accommodation  III,IV, VI: ptosis present bilaterally (right eyelid covers half of his iris and left eyelid covers all of his iris but now showing more sclera than 07/28/12, extra-ocular motions intact bilaterally  V,VII: smile asymmetric with resting left NL decrease, facial light touch sensation normal bilaterally  VIII: hearing normal bilaterally  IX,X: gag reflex present  XI: bilateral shoulder shrug  XII:  midline tongue extension  Motor:  Right : Upper extremity 5/5   Left:  Upper extremity 5/5   Lower extremity 5/5    Lower extremity 5/5  Tone and bulk:normal tone throughout; no atrophy noted  Sensory: Pinprick and light touch intact throughout, bilaterally  Deep Tendon Reflexes: 2+ and symmetric throughout  Plantars:  Right: downgoing Left: downgoing  Cerebellar:  normal finger-to-nose, normal heel-to-shin test  CV: pulses palpable throughout   Lab Results: Lab Results  Component Value Date/Time   CHOL 223* 06/27/2009  8:22 PM   Lipid Panel No results found for this basename: CHOL, TRIG, HDL, CHOLHDL, VLDL, LDLCALC,  in the last 72 hours  Studies/Results: Dg Chest Port 1 View  07/27/2012  *RADIOLOGY REPORT*  Clinical Data: Cough and congestion.  PORTABLE CHEST - 1 VIEW  Comparison: 07/24/2012  Findings: The cardiomediastinal silhouette is unremarkable. A tracheostomy tube and a left subclavian central venous catheter with tip overlying the mid SVC again identified.  Right lower lung opacity Right lower lung opacity noted - question atelectasis versus pneumonia. There is no evidence of pneumothorax or definite pleural effusion. No acute bony abnormalities are noted.  IMPRESSION: Right lower lung opacity - question pneumonia versus atelectasis.   Original Report Authenticated By: Harmon Pier, M.D.     MEDICATIONS                                                                                                                        Scheduled: . albuterol  2.5 mg Nebulization TID  . antiseptic oral rinse  15 mL Mouth Rinse QID  . aspirin  81 mg Oral Daily  . chlorhexidine  15 mL Mouth Rinse BID  . pantoprazole sodium  40 mg Per Tube Daily  . pyridostigmine  39.6 mg Oral QID  . pyridostigmine  60 mg Oral BID  . sodium chloride  10-40 mL Intracatheter Q12H  . Warfarin - Pharmacist Dosing Inpatient   Does not apply q1800    ASSESSMENT/PLAN:                                                                                                              Patient with MG with suboptimal results from IVIg and PLX. Now on Mestinon. Yesterday increased 0800 dose of mestinon to 60 mg. Patient received this dose at 8:30 am and seen at 9:30 by myself and he did not feel he noted any difference. On exam today he states he feels his left eye is stronger and objectively he is showing more sclera. Will assess again talk to attending about increasing Mestinon again.    Assessment and plan discussed with with attending physician and they are in agreement.    Felicie Morn PA-C [facility] [phone]  07/29/2012,  7:46 AM

## 2012-07-29 NOTE — Progress Notes (Signed)
Clinical Social Work  Per chart review, psych MD is signing off of case. CSW staffed case with unit CSW and updated CSW on plans. Psych CSW is signing off at this time but available if further needs arise.  [place], [place] [phone]

## 2012-07-29 NOTE — Progress Notes (Signed)
Passy-Muir Speaking Valve - Treatment Patient Details  Name: Victor Durham MRN: [id number] Date of Birth: 1956/04/25  Today's Date: 07/29/2012 Time: 8657-8469 SLP Time Calculation (min): 15 min  Past Medical History:  Past Medical History  Diagnosis Date  . DVT (deep venous thrombosis)   . Hypertension   . High cholesterol    Past Surgical History:  Past Surgical History  Procedure Laterality Date  . Esophagogastroduodenoscopy  07/11/2012    Procedure: ESOPHAGOGASTRODUODENOSCOPY (EGD);  Surgeon: Shirley Friar, MD;  Location: [facility];  Service: Endoscopy;  Laterality: N/A;  BEDSIDE    Assessment / Plan / Recommendation Clinical Impression  Focus of diagnostic treatment for PMSV tolerance.  RN deflated cuff and completed suctioning prior to placement.  RR increased to 41briefly when valve initially placed but decreased to upper 20"s.  O2 saturations decreased from 97% to 93% during placement.  Patient able to phonate briefly with max cues to take supporting breath.  RN reinflated cuff upon removal.  Recommend continued trials with SLP only.    Plan  Continue with current plan of care           SLP Goals SLP Goal #1 - Progress: Not met   PMSV Trial  PMSV was placed for: aproximately 5 minutes  Able to redirect subglottic air through upper airway: Yes Able to Attain Phonation: Yes Voice Quality: Breathy;Hoarse;Low vocal intensity Level of Secretion Expectoration with PMSV: Tracheal Breath Support for Phonation: Inadequate Intelligibility: Intelligibility reduced Word: 25-49% accurate Phrase: 25-49% accurate Sentence: 25-49% accurate Respirations During Trial: 31 SpO2 During Trial: 92 % Pulse During Trial: 67 Behavior: Alert;Listless;Cooperative   Tracheostomy Tube  Additional Tracheostomy Tube Assessment Level of Secretion Expectoration: Tracheal    Vent Dependency  FiO2 (%): 28 %    Cuff Deflation Trial  GO    Moreen Fowler MS,  CCC-SLP [phone] Tolerated Cuff Deflation: Yes Length of Time for Cuff Deflation Trial: 5 minutes Behavior: Alert;Cooperative   [facility] 07/29/2012, 4:00 PM

## 2012-07-29 NOTE — Progress Notes (Signed)
PULMONARY  / CRITICAL CARE MEDICINE  Name: Victor Durham MRN: [id number] DOB: 1956-03-21    ADMISSION DATE:  07/10/2012 CONSULTATION DATE:  1/30/20143  REFERRING MD :  Juleen China  CHIEF COMPLAINT:  Dysphagia  BRIEF PATIENT DESCRIPTION: 57 y/o homeless male presented to the [facility] ED On 1/30 for the third time in two weeks for evaluation of dysphagia.  He complained of some difficulty breathing and underwent a neck CT.  Shortly after that procedure he developed respiratory and cardiac arrest.  PCCM called for admission.  SIGNIFICANT EVENTS / STUDIES:  1/30 respiratory and cardiac arrest in ED 1/30 CT head: hypoattenuation R midbrain extending into L pons, poorly characterized due to streak artifact 130 Echocardiogram: LVEF normal. Mild to moderate RV dilatation 1/31 EGD gastritis and esophagitis but no mass or bleeding source 1/31 LE venous duplex scans: LLE DVT - heparin started 1/31 Thick secretions -plugged ETT during MRI 1/31 MRI -Scattered subcortical T2 hyperintensities -nonspecific, chronic microvascular ischemia vs a demyelinating process  2/1 Neurology consult: concern for myasthenia gravis 2/1 CT chest - no thymoma or central PE, Bilateral lower lobe consolidation and atelectasis , Nonspecific enhancing solid 4.1 cm mass exophytic from segment 2 of the left hepatic lobe  2/2 Mestinon started - intolerant due to excessive secretions 2/3 IVIG initiated by Neuro 2/4 Acetylcholine Receptor Ab 127.00 (<=0.30 mmol/L) 2/4 MRI cervical spine >> normal 2/4 Mestinon retried at a decreased dose 2-4 tx to cone. 2-7 Bronchoscopy 2/9- extubated 2/10 re intubated 2/11 trach 2/13- peg 2/13 Last TPE session 2/17 Warfarin begun   LINES / TUBES: 1/30 ETT >> 2/9 1/30 L Taliaferro CVL >> 2/17 2/5  RtI J HD Cath insertion>>  out  CULTURES: 1/30 blood >> NEG 1/30 urine >> NEG 2-2 sputum>> NEG 2-1 bc x 2 >> NEG 2-7 BAL >> oral flora 2-11 BAL >> NOF 2/15 C diff >> NEG  ANTIBIOTICS: unasyn  1/30 >> 2/4 levaquin 1/30 >> 2/4  SUBJECTIVE:  No new complaints. No distress. Unable to hold head up while ambulating  VITAL SIGNS: Temp:  [97.6 F (36.4 C)-98.3 F (36.8 C)] 98.2 F (36.8 C) (02/18 0753) Pulse Rate:  [52-83] 83 (02/18 1516) Resp:  [18-25] 21 (02/18 1443) BP: (94-111)/(57-73) 103/57 mmHg (02/18 1443) SpO2:  [94 %-100 %] 95 % (02/18 1516) FiO2 (%):  [28 %-30 %] 28 % (02/18 1516) VENTILATOR SETTINGS: Vent Mode:  [-] PRVC FiO2 (%):  [28 %-30 %] 28 % Set Rate:  [18 bmp] 18 bmp Vt Set:  [550 mL] 550 mL PEEP:  [5 cmH20] 5 cmH20 Plateau Pressure:  [15 cmH20-17 cmH20] 16 cmH20 INTAKE / OUTPUT: Intake/Output     02/17 0701 - 02/18 0700 02/18 0701 - 02/19 0700   I.V. (mL/kg) 595 (8.3)    Other 1370    Total Intake(mL/kg) 1965 (27.3)    Urine (mL/kg/hr) 550 (0.3) 150 (0.2)   Total Output 550 150   Net +1415 -150          PHYSICAL EXAMINATION: Gen: NAD HEENT: L>R lid lag PULM: no wheezing CV: regular, no murmur AB: Soft, non tender Ext: no edema Neuro: diffusely weak  LABS:  Recent Labs Lab 07/26/12 0500 07/27/12 0215 07/28/12 0440 07/29/12 0510  HGB 12.4* 12.6* 12.9* 12.9*  WBC 8.4 7.9 6.6 5.5  PLT 202 202 189 183  NA 135 133* 138  --   K 3.3* 4.2 3.7  --   CL 96 97 101  --   CO2 25 26 24   --  GLUCOSE 93 88 121*  --   BUN 23 24* 27*  --   CREATININE 0.58 0.55 0.53  --   CALCIUM 9.4 9.7 9.6  --   INR  --   --   --  1.17    Recent Labs Lab 07/23/12 1517 07/24/12 0022 07/24/12 0413 07/24/12 0832 07/24/12 1241  GLUCAP 86 81 88 80 87    CXR: No new film  ASSESSMENT / PLAN:  PULMONARY A:  Acute respiratory failure due to Myasthenia Gravis Aspiration PNA, resolved Tracheostomy status Nocturnal vent dependence P:   Trial ATC 24 hrs/d  CARDIOVASCULAR A: LLE DVT P:  Cont Heparin gtt Cont warfarin    RENAL A:  Volume overload, resolved Mild hypokalemia, resolved P:   Monitor I/Os, BMET intermittently K supp as  needed  GASTROINTESTINAL A:  Dysphagia due to MG.  S/P G tube  P:  TFs per nutrition   HEMATOLOGIC A: Anemia of critical illness. Thrombocytopenia >> resolved P:  Monitor CBC intermittently  INFECTIOUS A:  Aspiration PNA, treated Completed Abx 2/04.  P:   Cont to monitor  ENDOCRINE A:  Hyperglycemia, mild P:   Resume SSI if needed for glu > 180  NEUROLOGIC A: Myasthenia Gravis Anxiety P:   Mgmt per Neuro Cont PT/OT Cervical collar for head support while ambulating  Family contact:  Sadarius Sedlar [name] [phone]; back up contact for father: [name]: [phone] Consented father, all risks, explained, death, bleeding, ptx, infection  [name], MD ; [facility] service Mobile [phone].  After 5:30 PM or weekends, call [phone]

## 2012-07-29 NOTE — Progress Notes (Signed)
ANTICOAGULATION CONSULT NOTE - Follow Up Consult  Pharmacy Consult for Heparin, Coumadin Indication: DVT  No Known Allergies Patient Measurements: Height: 6' (182.9 cm) Weight: 158 lb 11.7 oz (72 kg) IBW/kg (Calculated) : 77.6 Heparin Dosing Weight: 75.5 kg  Labs:  Recent Labs  07/27/12 0215  07/28/12 0440 07/28/12 1214 07/29/12 0510  HGB 12.6*  --  12.9*  --  12.9*  HCT 37.6*  --  37.6*  --  39.3  PLT 202  --  189  --  183  LABPROT  --   --   --   --  14.7  INR  --   --   --   --  1.17  HEPARINUNFRC  --   < > 0.49 0.40 0.44  CREATININE 0.55  --  0.53  --   --   < > = values in this interval not displayed.  Estimated Creatinine Clearance: 105 ml/min (by C-G formula based on Cr of 0.53).  Assessment: 57yo male now therapeutic on heparin after difficulty achieving goal after resuming. Coumadin started 2/17- overlap day#2 for LLE DVT. Heparin level is therapeutic this am. Baseline INR = 1.3 from 2/10. INR today is 1.17 after 1 dose of 5mg  Coumadin. Bloody drainage from G-tube noted. Spoke with RN and this is minimal. Will monitor. CBC is stable.   Goal of Therapy:  INR 2-3 Heparin level 0.3-0.7 units/ml Monitor platelets by anticoagulation protocol: Yes   Plan:  1. Continue Heparin drip at 1500 units/hr. 2. Repeat Coumadin 5mg  po x1 tonight. 3. Daily INR and HL while on therapy.   Link Snuffer, PharmD, BCPS Clinical Pharmacist [phone] 07/29/2012,8:33 AM

## 2012-07-29 NOTE — Plan of Care (Signed)
Problem: Phase II Progression Outcomes Goal: Tolerating prescribed nutrition plan Outcome: Completed/Met Date Met:  07/29/12 Currently on Jevity and tol. Well.

## 2012-07-29 NOTE — Progress Notes (Signed)
Physical Therapy Treatment Patient Details Name: ARLIS YALE MRN: [id number] DOB: [birth year] Today's Date: 07/29/2012 Time: 4098-1191 PT Time Calculation (min): 31 min  PT Assessment / Plan / Recommendation Comments on Treatment Session  Pt admitted with cardiac arrest, myasthenia gravis, PEG, trach and progressing with mobility but largest concern is pt inability to maintain neck extension unsupported which limits progression of transfers and gait. Pt encouraged to continue HEP and mobility and to work on allowing musculature to support neck throughout the day. Will continue to follow.     Follow Up Recommendations        Does the patient have the potential to tolerate intense rehabilitation     Barriers to Discharge        Equipment Recommendations       Recommendations for Other Services    Frequency     Plan Discharge plan remains appropriate;Frequency remains appropriate    Precautions / Restrictions Precautions Precautions: Fall Precaution Comments: trach, peg, unable to extend neck actively greater than seconds and supports with his hand   Pertinent Vitals/Pain No pain sats 87-93% on 28% trach collar    Mobility  Bed Mobility Bed Mobility: Rolling Right;Rolling Left Rolling Right: 4: Min assist Rolling Left: 4: Min assist Left Sidelying to Sit: 4: Min assist;HOB flat;With rails Sitting - Scoot to Edge of Bed: 4: Min assist Sit to Supine: 4: Min assist;HOB flat Details for Bed Mobility Assistance: cueing for sequence and technique with use of rails and support of head with transfers side to and from sit, increased time for all transfers. Pt rolling bilaterally for pericare due to incontinent of bowel and unaware Transfers Sit to Stand: 1: +1 Total assist;From bed Sit to Stand: Patient Percentage: 70% Stand to Sit: 1: +2 Total assist;To chair/3-in-1 Stand to Sit: Patient Percentage: 70% Details for Transfer Assistance: cueing for hand placement. Supported  pt's head today so he could use both hands to push off surface and to hold onto RW but pt uncomfortable with support from therapist and used left hand to support head at chin but able to allow therapist to hold head for transfer to sitting and use hands to reach for chair with max cueing Ambulation/Gait Ambulation/Gait Assistance: 1: +2 Total assist Ambulation/Gait: Patient Percentage: 70% Ambulation Distance (Feet): 4 Feet Assistive device: Rolling walker Ambulation/Gait Assistance Details: Pt taking short shuffling steps with narrow BOS and unable to maintain bil UE on RW due to need for holding head despite therapist with hand supporting head. Pt maintaining posterior lean despite cueing and assist for anterior translation to maintain neck extension Gait Pattern: Shuffle;Narrow base of support Gait velocity: decreased    Exercises General Exercises - Lower Extremity Long Arc Quad: AROM;Both;20 reps;Seated Hip Flexion/Marching: AROM;Both;20 reps;Seated   PT Diagnosis:    PT Problem List:   PT Treatment Interventions:     PT Goals Acute Rehab PT Goals PT Goal: Supine/Side to Sit - Progress: Progressing toward goal PT Goal: Sit at Edge Of Bed - Progress: Progressing toward goal PT Goal: Sit to Stand - Progress: Progressing toward goal PT Transfer Goal: Bed to Chair/Chair to Bed - Progress: Progressing toward goal PT Goal: Ambulate - Progress: Progressing toward goal PT Goal: Perform Home Exercise Program - Progress: Progressing toward goal  Visit Information  Last PT Received On: 07/29/12 Assistance Needed: +2    Subjective Data  Subjective: pt mouthing words with trach   Cognition  Cognition Overall Cognitive Status: Impaired Area of Impairment: Safety/judgement Arousal/Alertness: Awake/alert Orientation  Level: Appears intact for tasks assessed Behavior During Session: [place] [facility] for tasks performed Current Attention Level: Selective Following Commands: Follows one step commands  consistently Safety/Judgement: Decreased awareness of safety precautions;Decreased awareness of need for assistance    Balance  Static Sitting Balance Static Sitting - Balance Support: No upper extremity supported;Feet supported Static Sitting - Level of Assistance: 5: Stand by assistance Static Sitting - Comment/# of Minutes: pt supporting head with hand and unable to release head only seconds at a time prior to loss of neck extension  End of Session PT - End of Session Equipment Utilized During Treatment: Gait belt;Oxygen Activity Tolerance: Patient tolerated treatment well Patient left: in chair;with call bell/phone within reach Nurse Communication: Mobility status   GP     Toney Sang [facility] 07/29/2012, 8:54 AM Delaney Meigs, PT [phone]

## 2012-07-30 LAB — CBC
HCT: 36.9 % — ABNORMAL LOW (ref 39.0–52.0)
MCV: 91.6 fL (ref 78.0–100.0)
Platelets: 160 10*3/uL (ref 150–400)
RBC: 4.03 MIL/uL — ABNORMAL LOW (ref 4.22–5.81)
WBC: 5.8 10*3/uL (ref 4.0–10.5)

## 2012-07-30 LAB — HEPARIN LEVEL (UNFRACTIONATED): Heparin Unfractionated: 0.41 IU/mL (ref 0.30–0.70)

## 2012-07-30 LAB — PROTIME-INR: INR: 1.18 (ref 0.00–1.49)

## 2012-07-30 MED ORDER — PREDNISONE 20 MG PO TABS
20.0000 mg | ORAL_TABLET | Freq: Every day | ORAL | Status: DC
  Administered 2012-07-30 – 2012-08-02 (×4): 20 mg via ORAL
  Filled 2012-07-30 (×5): qty 1

## 2012-07-30 MED ORDER — WARFARIN SODIUM 7.5 MG PO TABS
7.5000 mg | ORAL_TABLET | Freq: Once | ORAL | Status: AC
  Administered 2012-07-30: 7.5 mg via ORAL
  Filled 2012-07-30: qty 1

## 2012-07-30 MED ORDER — PYRIDOSTIGMINE BROMIDE 60 MG/5ML PO SYRP
60.0000 mg | ORAL_SOLUTION | Freq: Every day | ORAL | Status: DC
  Administered 2012-07-30 – 2012-07-31 (×4): 60 mg via ORAL
  Filled 2012-07-30 (×9): qty 5

## 2012-07-30 NOTE — Progress Notes (Signed)
ANTICOAGULATION CONSULT NOTE - Follow Up Consult  Pharmacy Consult for Heparin, Coumadin Indication: DVT  No Known Allergies Patient Measurements: Height: 6' (182.9 cm) Weight: 158 lb 11.7 oz (72 kg) IBW/kg (Calculated) : 77.6 Heparin Dosing Weight: 75.5 kg  Labs:  Recent Labs  07/28/12 0440  07/29/12 0510 07/30/12 0445 07/30/12 1155  HGB 12.9*  --  12.9* 12.3*  --   HCT 37.6*  --  39.3 36.9*  --   PLT 189  --  183 160  --   LABPROT  --   --  14.7 14.8  --   INR  --   --  1.17 1.18  --   HEPARINUNFRC 0.49  < > 0.44 0.23* 0.41  CREATININE 0.53  --   --   --   --   < > = values in this interval not displayed.  Estimated Creatinine Clearance: 105 ml/min (by C-G formula based on Cr of 0.53).  Assessment: 57yo male on IV heparin/Coumadin on overlap D3 for LLE DVT. INR is 1.18. Heparin level (0.41) is therapeutic on 1650 units/hr.  Minimal stable bleeding around trach and G-tube site per RN.   Goal of Therapy:  INR 2-3 Heparin level 0.3-0.7 units/ml Monitor platelets by anticoagulation protocol: Yes   Plan:  1. Continue IV heparin at 1650 units/hr.  2. Coumadin 7.5mg  po today.  3. Heparin level, CBC, INR in AM   Celedonio Miyamoto, PharmD, BCPS Clinical Pharmacist Pager [phone]   07/30/2012,1:30 PM

## 2012-07-30 NOTE — Progress Notes (Signed)
Sutures removed per MD order. Pt tolerated well, no complications.

## 2012-07-30 NOTE — Progress Notes (Signed)
OT Cancellation Note  Patient Details Name: KAMILO OCH MRN: [id number] DOB: 1956/03/05   Cancelled Treatment:    Reason Eval/Treat Not Completed: Patient at procedure or test/ unavailable (Respirtory with pt. Will see tomorrow)  Jannifer Hick 07/30/2012, 6:56 PM

## 2012-07-30 NOTE — Progress Notes (Signed)
eLink Physician-Brief Progress Note Patient Name: IBROHIM SIMMERS DOB: [birth year] MRN: [id number]  Date of Service  07/30/2012   HPI/Events of Note   Order placed to remove trach sutures per Dr Carney Harder plans  eICU Interventions     Intervention Category Minor Interventions: Routine modifications to care plan (e.g. PRN medications for pain, fever)  Malaina Mortellaro S. 07/30/2012, 4:27 PM

## 2012-07-30 NOTE — Progress Notes (Addendum)
Subjective: Feels like he is slightly stronger than prior to PLEX. Still having some difficulty with swallowing.   Exam: Filed Vitals:   07/30/12 1000  BP: 104/59  Pulse: 73  Temp:   Resp: 16   Gen: In bed, NAD MS: Awake, Alert, writes questions on pad, follows commands briskly.  ZO:XWRUE, left eye is externally deviated and ptotic(old injury) Motor: 5/5 distally, 4/5 and fatigable proximally.  Sensory:Intact to LT.  Impression: 57 yo M with MG and continued symptoms despite PLEX. At this time, he will need chronic immunomodulation. I would start with prednisone, with a slightly more rapid taper than usual due to his receiving PLEX.   Recommendations: 1)Prednisone 20mg  Qday x 3 days, then 25mg  qday x 3 days, then 30mg  qday x 3 days, then 35mg  qday x 3 days, etc to a goal of 70 mg/day(1mg /kg/day).  2) increase mestinon to 60mg  q4h during day 5 doses)  3) Continue PT, OT.   Ritta Slot, MD [facility] [phone]  If 7pm- 7am, please page neurology on call at [phone].

## 2012-07-30 NOTE — Progress Notes (Signed)
Clinical Social Worker made referral to [facility].  CSW to continue to follow and assist as needed.    [name], MSW, [place] [phone]

## 2012-07-30 NOTE — Progress Notes (Signed)
PULMONARY  / CRITICAL CARE MEDICINE  Name: Victor Durham MRN: [id number] DOB: 10-Feb-1956    ADMISSION DATE:  07/10/2012 CONSULTATION DATE:  1/30/20143  REFERRING MD :  Juleen China  CHIEF COMPLAINT:  Dysphagia  BRIEF PATIENT DESCRIPTION: 57 y/o homeless male presented to the [facility] ED On 1/30 for the third time in two weeks for evaluation of dysphagia.  He complained of some difficulty breathing and underwent a neck CT.  Shortly after that procedure he developed respiratory and cardiac arrest.  PCCM called for admission.  SIGNIFICANT EVENTS / STUDIES:  1/30 respiratory and cardiac arrest in ED 1/30 CT head: hypoattenuation R midbrain extending into L pons, poorly characterized due to streak artifact 130 Echocardiogram: LVEF normal. Mild to moderate RV dilatation 1/31 EGD gastritis and esophagitis but no mass or bleeding source 1/31 LE venous duplex scans: LLE DVT - heparin started 1/31 Thick secretions -plugged ETT during MRI 1/31 MRI -Scattered subcortical T2 hyperintensities -nonspecific, chronic microvascular ischemia vs a demyelinating process  2/1 Neurology consult: concern for myasthenia gravis 2/1 CT chest - no thymoma or central PE, Bilateral lower lobe consolidation and atelectasis , Nonspecific enhancing solid 4.1 cm mass exophytic from segment 2 of the left hepatic lobe  2/2 Mestinon started - intolerant due to excessive secretions 2/3 IVIG initiated by Neuro 2/4 Acetylcholine Receptor Ab 127.00 (<=0.30 mmol/L) 2/4 MRI cervical spine >> normal 2/4 Mestinon retried at a decreased dose 2-4 tx to cone. 2-7 Bronchoscopy 2/9- extubated 2/10 re intubated 2/11 trach 2/13- peg 2/13 Last TPE session 2/17 Warfarin begun  2/19 tolerated 18 hrs continuous ATC   LINES / TUBES: 1/30 ETT >> 2/9 1/30 L [place] CVL >> 2/17 2/5  RtI J HD Cath insertion>>  Out 2/11 trach >>   CULTURES: 1/30 blood >> NEG 1/30 urine >> NEG 2-2 sputum>> NEG 2-1 bc x 2 >> NEG 2-7 BAL >> oral flora 2-11  BAL >> NOF 2/15 C diff >> NEG  ANTIBIOTICS: unasyn 1/30 >> 2/4 levaquin 1/30 >> 2/4  SUBJECTIVE:  No new complaints. No distress. Still have not obtained Aspen collar  VITAL SIGNS: Temp:  [97.4 F (36.3 C)-98.4 F (36.9 C)] 97.4 F (36.3 C) (02/19 1632) Pulse Rate:  [54-86] 69 (02/19 1632) Resp:  [14-26] 22 (02/19 1632) BP: (80-117)/(53-68) 95/58 mmHg (02/19 1632) SpO2:  [95 %-100 %] 96 % (02/19 1632) FiO2 (%):  [28 %-30 %] 28 % (02/19 1632) VENTILATOR SETTINGS: Vent Mode:  [-] PRVC FiO2 (%):  [28 %-30 %] 28 % Set Rate:  [18 bmp] 18 bmp Vt Set:  [550 mL] 550 mL PEEP:  [5 cmH20] 5 cmH20 Plateau Pressure:  [16 cmH20] 16 cmH20 INTAKE / OUTPUT: Intake/Output     02/18 0701 - 02/19 0700 02/19 0701 - 02/20 0700   I.V. (mL/kg) 135 (1.9)    Other 770 630   Total Intake(mL/kg) 905 (12.6) 630 (8.8)   Urine (mL/kg/hr) 450 (0.3) 250 (0.3)   Total Output 450 250   Net +455 +380        Stool Occurrence  1 x     PHYSICAL EXAMINATION: Gen: NAD HEENT: L>R lid lag PULM: no wheezing CV: regular, no murmur AB: Soft, non tender Ext: no edema Neuro: diffusely weak  LABS:  Recent Labs Lab 07/26/12 0500 07/27/12 0215 07/28/12 0440 07/29/12 0510 07/30/12 0445  HGB 12.4* 12.6* 12.9* 12.9* 12.3*  WBC 8.4 7.9 6.6 5.5 5.8  PLT 202 202 189 183 160  NA 135 133* 138  --   --  K 3.3* 4.2 3.7  --   --   CL 96 97 101  --   --   CO2 25 26 24   --   --   GLUCOSE 93 88 121*  --   --   BUN 23 24* 27*  --   --   CREATININE 0.58 0.55 0.53  --   --   CALCIUM 9.4 9.7 9.6  --   --   INR  --   --   --  1.17 1.18    Recent Labs Lab 07/24/12 0022 07/24/12 0413 07/24/12 0832 07/24/12 1241  GLUCAP 81 88 80 87    CXR: No new film  ASSESSMENT / PLAN:  PULMONARY A:  Acute respiratory failure due to Myasthenia Gravis Aspiration PNA, resolved Tracheostomy status Nocturnal vent dependence P:   Cont ATC as long as tolerated > needs to go 48-72 hrs off vent prior to transfer out of  SDU  CARDIOVASCULAR A: LLE DVT P:  Cont Heparin gtt until INR > 2.0 X days Cont warfarin    RENAL A:  Volume overload, resolved Mild hypokalemia, resolved P:   Monitor I/Os, BMET intermittently K supp as needed  GASTROINTESTINAL A:  Dysphagia due to MG.  S/P G tube  P:  Cont TFs   HEMATOLOGIC A: Anemia of critical illness. Thrombocytopenia, resolved P:  Monitor CBC intermittently  INFECTIOUS A:  Aspiration PNA, treated   P:   Cont to monitor  ENDOCRINE A:  Hyperglycemia, mild P:   Resume SSI if needed for glu > 180  NEUROLOGIC A: Myasthenia Gravis Anxiety P:   Mgmt per Neuro Cont PT/OT Cervical collar for head support while ambulating  Family contact:  [name] [phone]; back up contact for father: [name]: [phone] Consented father, all risks, explained, death, bleeding, ptx, infection  [name], MD ; [facility] service [place] [phone].  After 5:30 PM or weekends, call [phone]

## 2012-07-30 NOTE — Progress Notes (Signed)
ANTICOAGULATION CONSULT NOTE - Follow Up Consult  Pharmacy Consult for Heparin, Coumadin Indication: DVT  No Known Allergies Patient Measurements: Height: 6' (182.9 cm) Weight: 158 lb 11.7 oz (72 kg) IBW/kg (Calculated) : 77.6 Heparin Dosing Weight: 75.5 kg  Labs:  Recent Labs  07/28/12 0440 07/28/12 1214 07/29/12 0510 07/30/12 0445  HGB 12.9*  --  12.9* 12.3*  HCT 37.6*  --  39.3 36.9*  PLT 189  --  183 160  LABPROT  --   --  14.7 14.8  INR  --   --  1.17 1.18  HEPARINUNFRC 0.49 0.40 0.44 0.23*  CREATININE 0.53  --   --   --     Estimated Creatinine Clearance: 105 ml/min (by C-G formula based on Cr of 0.53).  Assessment: 57yo male on IV heparin/Coumadin on overlap D3 for LLE DVT. INR is 1.18. Heparin level (0.23) is below-goal on 1500 units/hr. No problem with line / infusion and minimal stable bleeding around trach and G-tube site per RN.   Goal of Therapy:  INR 2-3 Heparin level 0.3-0.7 units/ml Monitor platelets by anticoagulation protocol: Yes   Plan:  1. Increase IV heparin to 1650 units/hr.  2. Coumadin 7.5mg  po today.  3. Heparin level in 6 hours.   Lorre Munroe, PharmD 07/30/2012,5:53 AM

## 2012-07-30 NOTE — Progress Notes (Signed)
Notified MD Simmonds for order for suture removal.

## 2012-07-31 LAB — CBC
HCT: 39 % (ref 39.0–52.0)
MCH: 29.1 pg (ref 26.0–34.0)
MCHC: 31.5 g/dL (ref 30.0–36.0)
RDW: 15.4 % (ref 11.5–15.5)

## 2012-07-31 LAB — CREATININE, SERUM
GFR calc Af Amer: >90 mL/min (ref >90)
GFR calc non Af Amer: >90 mL/min (ref >90)

## 2012-07-31 LAB — PROTIME-INR: Prothrombin Time: 17.5 seconds — ABNORMAL HIGH (ref 11.6–15.2)

## 2012-07-31 MED ORDER — TRAZODONE HCL 100 MG PO TABS
100.0000 mg | ORAL_TABLET | Freq: Every day | ORAL | Status: DC
  Administered 2012-07-31 – 2012-08-05 (×6): 100 mg via ORAL
  Filled 2012-07-31 (×7): qty 1

## 2012-07-31 MED ORDER — PYRIDOSTIGMINE BROMIDE 60 MG/5ML PO SYRP
60.0000 mg | ORAL_SOLUTION | ORAL | Status: DC
  Administered 2012-07-31 – 2012-08-02 (×9): 60 mg via ORAL
  Filled 2012-07-31 (×12): qty 5

## 2012-07-31 MED ORDER — PYRIDOSTIGMINE BROMIDE ER 180 MG PO TBCR
180.0000 mg | EXTENDED_RELEASE_TABLET | Freq: Every day | ORAL | Status: DC
  Administered 2012-07-31 – 2012-08-01 (×2): 180 mg via ORAL
  Filled 2012-07-31 (×3): qty 1

## 2012-07-31 MED ORDER — HYDROMORPHONE HCL PF 1 MG/ML IJ SOLN: 0.5000 mg | INTRAMUSCULAR | Status: DC | PRN

## 2012-07-31 MED ORDER — WARFARIN SODIUM 5 MG PO TABS
5.0000 mg | ORAL_TABLET | Freq: Once | ORAL | Status: AC
  Administered 2012-07-31: 5 mg via ORAL
  Filled 2012-07-31: qty 1

## 2012-07-31 NOTE — Progress Notes (Signed)
PULMONARY  / CRITICAL CARE MEDICINE  Name: Victor Durham MRN: [id number] DOB: 24-Jul-1955    ADMISSION DATE:  07/10/2012 CONSULTATION DATE:  1/30/20143  REFERRING MD :  Juleen China  CHIEF COMPLAINT:  Dysphagia  BRIEF PATIENT DESCRIPTION: 57 y/o homeless male presented to the [facility] ED On 1/30 for the third time in two weeks for evaluation of dysphagia.  He complained of some difficulty breathing and underwent a neck CT.  Shortly after that procedure he developed respiratory and cardiac arrest.  PCCM called for admission.  SIGNIFICANT EVENTS / STUDIES:  1/30 respiratory and cardiac arrest in ED 1/30 CT head: hypoattenuation R midbrain extending into L pons, poorly characterized due to streak artifact 130 Echocardiogram: LVEF normal. Mild to moderate RV dilatation 1/31 EGD gastritis and esophagitis but no mass or bleeding source 1/31 LE venous duplex scans: LLE DVT - heparin started 1/31 Thick secretions -plugged ETT during MRI 1/31 MRI -Scattered subcortical T2 hyperintensities -nonspecific, chronic microvascular ischemia vs a demyelinating process  2/1 Neurology consult: concern for myasthenia gravis 2/1 CT chest - no thymoma or central PE, Bilateral lower lobe consolidation and atelectasis , Nonspecific enhancing solid 4.1 cm mass exophytic from segment 2 of the left hepatic lobe  2/2 - Mestinon started - intolerant due to excessive secretions 2/3 - IVIG initiated by Neuro 2/4 - Acetylcholine Receptor Ab 127.00 (<=0.30 mmol/L) 2/4 - MRI cervical spine >> normal 2/4 - Mestinon retried at a decreased dose 2-4 - tx to cone. 2-7 - Bronchoscopy 2/9 - extubated 2/10- re intubated 2/11 - trach 2/13 - peg 2/13 - Last TPE session 2/17 - Warfarin begun  2/19 - tolerated 18 hrs continuous ATC 2/20 - up to chair, no distress, fitted for brace  LINES / TUBES: 1/30 ETT >> 2/9 1/30 L [place] CVL >> 2/17 2/5  RtI J HD Cath insertion>>  Out 2/11 trach >>   CULTURES: 1/30 blood >> NEG 1/30 urine  >> NEG 2-2 sputum>> NEG 2-1 bc x 2 >> NEG 2-7 BAL >> oral flora 2-11 BAL >> NOF 2/15 C diff >> NEG  ANTIBIOTICS: unasyn 1/30 >> 2/4 levaquin 1/30 >> 2/4  SUBJECTIVE: Feels like he needs to be suctioned.  No other complaints    VITAL SIGNS: Temp:  [97.4 F (36.3 C)-98.1 F (36.7 C)] 97.7 F (36.5 C) (02/20 1217) Pulse Rate:  [54-73] 71 (02/20 1217) Resp:  [15-23] 23 (02/20 1217) BP: (90-116)/(48-71) 107/66 mmHg (02/20 1217) SpO2:  [93 %-100 %] 97 % (02/20 1217) FiO2 (%):  [28 %] 28 % (02/20 1217)  VENTILATOR SETTINGS: Vent Mode:  [-]  FiO2 (%):  [28 %] 28 %  INTAKE / OUTPUT: Intake/Output     02/19 0701 - 02/20 0700 02/20 0701 - 02/21 0700   I.V. (mL/kg) 116.5 (1.6) 79.5 (1.1)   Other 1470 210   NG/GT 40    Total Intake(mL/kg) 1626.5 (22.6) 289.5 (4)   Urine (mL/kg/hr) 600 (0.3) 250 (0.5)   Total Output 600 250   Net +1026.5 +39.5        Stool Occurrence 1 x      PHYSICAL EXAMINATION: Gen: NAD HEENT: L>R lid lag PULM: no wheezing CV: regular, no murmur AB: Soft, non tender Ext: no edema Neuro: diffusely weak  LABS:  Recent Labs Lab 07/26/12 0500 07/27/12 0215 07/28/12 0440 07/29/12 0510 07/30/12 0445 07/31/12 0600  HGB 12.4* 12.6* 12.9* 12.9* 12.3* 12.3*  WBC 8.4 7.9 6.6 5.5 5.8 5.1  PLT 202 202 189 183 160 158  NA 135 133* 138  --   --   --   K 3.3* 4.2 3.7  --   --   --   CL 96 97 101  --   --   --   CO2 25 26 24   --   --   --   GLUCOSE 93 88 121*  --   --   --   BUN 23 24* 27*  --   --   --   CREATININE 0.58 0.55 0.53  --   --  0.51  CALCIUM 9.4 9.7 9.6  --   --   --   INR  --   --   --  1.17 1.18 1.48   No results found for this basename: GLUCAP,  in the last 168 hours  CXR: No new film  ASSESSMENT / PLAN:  PULMONARY A:  Acute respiratory failure due to Myasthenia Gravis Aspiration PNA, resolved Tracheostomy status Nocturnal vent dependence  P:   Cont ATC as long as tolerated > needs to go 48-72 hrs off vent prior to transfer  out of SDU  CARDIOVASCULAR A:  LLE DVT  P:  Cont Heparin gtt until INR > 2.0 X days Cont warfarin    RENAL A:   Volume overload, resolved Mild hypokalemia, resolved  P:   Monitor I/Os, BMET intermittently K supp as needed  GASTROINTESTINAL A:   Dysphagia due to MG. S/P G tube   P: Cont TFs   HEMATOLOGIC A:  Anemia of critical illness. Thrombocytopenia, resolved  P:  Monitor CBC intermittently  INFECTIOUS A:   Aspiration PNA, treated  P:   Cont to monitor  ENDOCRINE A:   Hyperglycemia, mild  P:   Resume SSI if needed for glu > 180  NEUROLOGIC A:  Myasthenia Gravis Anxiety  P:   Mgmt per Neuro Cont PT/OT Cervical collar for head support while ambulating  Family contact:  Cordaryl Duffett [name] [phone]; back up contact for father: [name]: [phone]   Canary Brim, NP-C [facility] Pgr: [phone] or [phone]     I have interviewed and examined the patient and reviewed the database. I have formulated the assessment and plan as reflected in the note above with amendments made by me.   [name], MD;  PCCM service; [place] [phone]

## 2012-07-31 NOTE — Progress Notes (Signed)
Subjective: No increase in secretions with mestinon increase, no SE, and he feels it helped some Exam: Filed Vitals:   07/31/12 0900  BP: 111/71  Pulse: 69  Temp:   Resp: 18   Gen: In bed, NAD MS: Awake, Alert, writes questions on pad, follows commands briskly.  AV:WUJWJ, left eye is externally deviated and ptotic(old injury), Palate does not elevate well.  Motor: 5/5 distally, 4/5 and fatigable proximally.  Sensory:Intact to LT.  Impression: 57 yo M with MG and continued symptoms despite PLEX. At this time, he will need chronic immunomodulation. I would start with prednisone, with a slightly more rapid taper than usual due to his receiving PLEX.   Recommendations: 1)Prednisone 20mg  Qday x 3 days, then(2/22) 25mg  qday x 3 days, then 30mg  qday x 3 days, then 35mg  qday x 3 days, etc to a goal of 70 mg/day(1mg /kg/day).  2) increase mestinon to 60mg  q4h during day, will change PM dose to XR dose(180mg ).  3) Continue PT, OT.   Ritta Slot, MD [facility] [phone]  If 7pm- 7am, please page neurology on call at [phone].

## 2012-07-31 NOTE — Progress Notes (Signed)
Clinical Social Worker received notification from [facility] that they are unable to accept pt.  CSW staffed case with [facility].  [facility] currently reviewing information.   [name], MSW, [place] [phone]

## 2012-07-31 NOTE — Progress Notes (Signed)
ANTICOAGULATION CONSULT NOTE - Follow Up Consult  Pharmacy Consult for Heparin, Coumadin Indication: DVT  No Known Allergies Patient Measurements: Height: 6' (182.9 cm) Weight: 158 lb 11.7 oz (72 kg) IBW/kg (Calculated) : 77.6 Heparin Dosing Weight: 75.5 kg  Labs:  Recent Labs  07/29/12 0510 07/30/12 0445 07/30/12 1155 07/31/12 0600  HGB 12.9* 12.3*  --  12.3*  HCT 39.3 36.9*  --  39.0  PLT 183 160  --  158  LABPROT 14.7 14.8  --  17.5*  INR 1.17 1.18  --  1.48  HEPARINUNFRC 0.44 0.23* 0.41 0.64  CREATININE  --   --   --  0.51    Estimated Creatinine Clearance: 105 ml/min (by C-G formula based on Cr of 0.51).  Assessment: 57yo male on IV heparin/Coumadin on overlap D4 for LLE DVT. INR is 1.48. Heparin level (0.64) is therapeutic on 1650 units/hr.  Minimal stable bleeding around trach and G-tube site per RN.   Goal of Therapy:  INR 2-3 Heparin level 0.3-0.7 units/ml Monitor platelets by anticoagulation protocol: Yes   Plan:  1. Continue IV heparin at 1650 units/hr.  2. Coumadin 5mg  po today.  3. Heparin level, CBC, INR in AM   Link Snuffer, PharmD, BCPS Clinical Pharmacist [phone] 07/31/2012,9:43 AM

## 2012-07-31 NOTE — Progress Notes (Signed)
Orthopedic Tech Progress Note Patient Details:  Victor Durham [birth year] [id number] [employer] called to place brace order Patient ID: JONCARLOS ATKISON, male   DOB: 03/06/1956, 57 y.o.   MRN: [id number]   Orie Rout 07/31/2012, 12:58 PM

## 2012-07-31 NOTE — Progress Notes (Signed)
Occupational Therapy Treatment Patient Details Name: Victor Durham MRN: [id number] DOB: [birth year] Today's Date: 07/31/2012 Time: 4098-1191 OT Time Calculation (min): 26 min  OT Assessment / Plan / Recommendation Comments on Treatment Session      Follow Up Recommendations       Barriers to Discharge       Equipment Recommendations       Recommendations for Other Services    Frequency     Plan      Precautions / Restrictions Precautions Precautions: Fall Precaution Comments: trach, peg, unable to extend neck actively greater than seconds and supports with his hand Restrictions Weight Bearing Restrictions: No   Pertinent Vitals/Pain     ADL  Toilet Transfer: Minimal assistance Toilet Transfer Method: Sit to stand Toilet Transfer Equipment: Bedside commode Transfers/Ambulation Related to ADLs: Pt ambulated with min A and +1 for lines and safety ADL Comments: Pt required max encouragement to participate, but was appreciative once he worked with therpies.  Pt. with Aspen collar in room, and it was placed on pt. for head/neck support.  After ambulating, collar removed, and pt able to maintain head/neck in neutral without neck flexion.  He then performed 10 reps shoulder each UE while maintiaining thoracic and cervical region in neutral flex/ext.  Pt did fatigue with UE exercises    OT Diagnosis:    OT Problem List:   OT Treatment Interventions:     OT Goals    Visit Information  Last OT Received On: 07/31/12 Assistance Needed: +2 PT/OT Co-Evaluation/Treatment: Yes    Subjective Data      Prior Functioning       Cognition  Cognition Overall Cognitive Status: Impaired Area of Impairment: Safety/judgement Arousal/Alertness: Awake/alert Orientation Level: Appears intact for tasks assessed Behavior During Session: [facility] for tasks performed Current Attention Level: Selective    Mobility       Exercises      Balance     End of Session    GO      Jeani Hawking M 07/31/2012, 3:53 PM

## 2012-07-31 NOTE — Progress Notes (Signed)
PT Cancellation Note  Patient Details Name: Victor Durham MRN: [id number] DOB: 1955/12/14   Cancelled Treatment:    Reason Eval/Treat Not Completed: Other (comment) (awaiting order for cervical collar to progress mobility) Will attempt in PM if collar ordered and arrives.   Delaney Meigs, PT [phone]

## 2012-07-31 NOTE — Progress Notes (Signed)
Physical Therapy Treatment Patient Details Name: Victor Durham MRN: [id number] DOB: 24-Mar-1956 Today's Date: 07/31/2012 Time: 7829-5621 PT Time Calculation (min): 30 min  PT Assessment / Plan / Recommendation Comments on Treatment Session  Pt admitted with cardiac arrest, myasthenia gravis, PEG, trach and progressing with mobility today with use of cervical collar. Pt in chair on arrival and able to lift head without use of hand however when asked to start mobility pt reverting back to neck flexion and holding his head with his hand. Used cervical collar and able to ambulate. After ambulation back to chair per pt request and able to maintain neck extension in sitting and perform bil shoulder flexion with OT. Pt maintaining sats in the 90s on RA throughout gait. Pt initially required encouragement to participate but at the end happy he walked and eager to try again. Encouraged mobility with nursing.     Follow Up Recommendations        Does the patient have the potential to tolerate intense rehabilitation     Barriers to Discharge        Equipment Recommendations       Recommendations for Other Services    Frequency     Plan Discharge plan remains appropriate;Frequency remains appropriate    Precautions / Restrictions Precautions Precautions: Fall Precaution Comments: trach, peg, use cervical collar to ambulate Restrictions Weight Bearing Restrictions: No   Pertinent Vitals/Pain No pain sats in the 90s throughout on RA    Mobility  Bed Mobility Bed Mobility: Not assessed Transfers Sit to Stand: 4: Min assist;From chair/3-in-1 Stand to Sit: 4: Min assist;To chair/3-in-1;With armrests Details for Transfer Assistance: cueing for hand placement and sequence with cervical collar on and pt able to pull forward and push up with use of bil hands free due to not feeling the need to hold his head with his hand.  Ambulation/Gait Ambulation/Gait Assistance: 4: Min assist (+1 for  lines) Ambulation Distance (Feet): 108 Feet Assistive device: Rolling walker Ambulation/Gait Assistance Details: cueing for posture, position in RW and assist to turn RW. pt maintains neck and trunk flexion with gait despite cueing but able to progress gait and needed cueing to increase BOS as tendency to scissor at times Gait Pattern: Shuffle;Narrow base of support;Trunk flexed Gait velocity: decreased Stairs: No    Exercises     PT Diagnosis:    PT Problem List:   PT Treatment Interventions:     PT Goals Acute Rehab PT Goals PT Goal: Sit to Stand - Progress: Progressing toward goal PT Transfer Goal: Bed to Chair/Chair to Bed - Progress: Progressing toward goal Pt will Ambulate: >150 feet;with supervision;with least restrictive assistive device PT Goal: Ambulate - Progress: Updated due to goal met PT Goal: Perform Home Exercise Program - Progress: Progressing toward goal  Visit Information  Last PT Received On: 07/31/12 Assistance Needed: +2 (safety with amb)    Subjective Data  Subjective: "I'm tired"   Cognition  Cognition Overall Cognitive Status: Impaired Area of Impairment: Problem solving Arousal/Alertness: Awake/alert Orientation Level: Appears intact for tasks assessed Behavior During Session: [facility] for tasks performed Current Attention Level: Selective    Balance     End of Session PT - End of Session Equipment Utilized During Treatment: Cervical collar Activity Tolerance: Patient tolerated treatment well Patient left: in chair;with call bell/phone within reach Nurse Communication: Mobility status   GP     Victor Durham 07/31/2012, 4:17 PM Delaney Meigs, PT [phone]

## 2012-07-31 NOTE — Progress Notes (Signed)
Clinical Social Worker spoke with Victor Durham at [facility]-they are currently considering pt.  CSW to continue to follow and assist as needed.    [name], [place] [phone]

## 2012-08-01 LAB — CBC
Hemoglobin: 12.4 g/dL — ABNORMAL LOW (ref 13.0–17.0)
MCH: 30.4 pg (ref 26.0–34.0)
MCHC: 33.2 g/dL (ref 30.0–36.0)
MCV: 91.7 fL (ref 78.0–100.0)

## 2012-08-01 LAB — BASIC METABOLIC PANEL
BUN: 19 mg/dL (ref 6–23)
CO2: 31 mEq/L (ref 19–32)
Glucose, Bld: 88 mg/dL (ref 70–99)
Potassium: 4.2 mEq/L (ref 3.5–5.1)
Sodium: 139 mEq/L (ref 135–145)

## 2012-08-01 MED ORDER — SODIUM CHLORIDE 0.9 % IJ SOLN
3.0000 mL | Freq: Two times a day (BID) | INTRAMUSCULAR | Status: DC
  Administered 2012-08-01 – 2012-08-04 (×6): 3 mL via INTRAVENOUS
  Administered 2012-08-05: 09:00:00 via INTRAVENOUS
  Administered 2012-08-06: 3 mL via INTRAVENOUS

## 2012-08-01 MED ORDER — WARFARIN SODIUM 7.5 MG PO TABS
7.5000 mg | ORAL_TABLET | Freq: Once | ORAL | Status: AC
  Administered 2012-08-01: 7.5 mg via ORAL
  Filled 2012-08-01: qty 1

## 2012-08-01 NOTE — Progress Notes (Signed)
ANTICOAGULATION CONSULT NOTE - Follow Up Consult  Pharmacy Consult for Heparin, Coumadin Indication: DVT  No Known Allergies Patient Measurements: Height: 6' (182.9 cm) Weight: 158 lb 11.7 oz (72 kg) IBW/kg (Calculated) : 77.6 Heparin Dosing Weight: 75.5 kg  Labs:  Recent Labs  07/30/12 0445 07/30/12 1155 07/31/12 0600 08/01/12 0517  HGB 12.3*  --  12.3* 12.4*  HCT 36.9*  --  39.0 37.4*  PLT 160  --  158 136*  LABPROT 14.8  --  17.5* 19.8*  INR 1.18  --  1.48 1.75*  HEPARINUNFRC 0.23* 0.41 0.64 0.69  CREATININE  --   --  0.51 0.51    Estimated Creatinine Clearance: 105 ml/min (by C-G formula based on Cr of 0.51).  Assessment: 57 y.o. M on Heparin + Warfarin for new LLE DVT. Today is VTE overlap D#5/5 however best practice is to continue overlap until INR therapeutic x 24 hours. Heparin level this morning is therapeutic (HL 0.69, goal of 0.3-0.7), INR is SUBtherapeutic though trending up nicely (INR 1.75 << 1.48, goal of 2-3).  Minimal dry residual blood noted around trach and G-tube site. Hgb/Hct stable, plts continue to slowly trend down.  Goal of Therapy:  INR 2-3 Heparin level 0.3-0.7 units/ml Monitor platelets by anticoagulation protocol: Yes   Plan:  1. Continue heparin drip at current rate of 1650 units/hr (16.5 ml/hr)  2. Warfarin 7.5 mg x 1 dose at 1800 today 3. Will continue to monitor for any signs/symptoms of bleeding and will follow up with heparin level and PT/INR in the a.m.   Georgina Pillion, PharmD, BCPS Clinical Pharmacist Pager: [phone] 08/01/2012 8:51 AM

## 2012-08-01 NOTE — Progress Notes (Signed)
NUTRITION FOLLOW UP  Intervention:   1.  Enteral nutrition; continue with Jevity 1.2 at 70 mL/hr goal to provide 2016 kcal, 93g protein, 1377 mL free water. Will follow for nutritional adequacy after new wt obtained.    Nutrition Dx:   Inadequate oral intake, ongoing.  Monitor:   1. Enteral nutrition; advancements with tolerance.  Pt meeting >/=90% estimated needs.    Assessment:   Pt admitted with dysphagia. Developed cardiac and respiratory arrest in the ED. Pt required several days of ventilator support and now has trach in place.  Currently on trach collar.   Pt has been dx with myasthenias gravis. Having increased secretions.   Pt has achieved goal of Jevity 1.2 @ 70 mL/hr providing 2016 kcal, 93g protein, 1377 mL free water. Residuals: none documented  +BMs.    Pt followed by SLP for PMSV trials. Pt denies discomfort with TF.  TFs currently on hold- discussed with RN who will reset machine.  Also requested a new wt be obtained for pt.    Height: Ht Readings from Last 1 Encounters:  07/22/12 6' (1.829 m)    Weight Status:   Wt Readings from Last 1 Encounters:  07/28/12 158 lb 11.7 oz (72 kg)    Re-estimated needs:  Kcal: 2010-2250 Protein: 81-97g Fluid: >2.0 L/day  Skin: intact, generalized non-pitting edema  Diet Order: NPO   Intake/Output Summary (Last 24 hours) at 08/01/12 1500 Last data filed at 08/01/12 0700  Gross per 24 hour  Intake   1604 ml  Output   1250 ml  Net    354 ml    Last BM: 2/20   Labs:   Recent Labs Lab 07/27/12 0215 07/28/12 0440 07/31/12 0600 08/01/12 0517  NA 133* 138  --  139  K 4.2 3.7  --  4.2  CL 97 101  --  102  CO2 26 24  --  31  BUN 24* 27*  --  19  CREATININE 0.55 0.53 0.51 0.51  CALCIUM 9.7 9.6  --  9.6  GLUCOSE 88 121*  --  88    CBG (last 3)  No results found for this basename: GLUCAP,  in the last 72 hours  Scheduled Meds: . albuterol  2.5 mg Nebulization TID  . antiseptic oral rinse  15 mL Mouth  Rinse QID  . aspirin  81 mg Oral Daily  . chlorhexidine  15 mL Mouth Rinse BID  . predniSONE  20 mg Oral Q breakfast  . pyridostigmine  60 mg Oral Custom  . pyridostigmine  180 mg Oral QHS  . sodium chloride  10-40 mL Intracatheter Q12H  . traZODone  100 mg Oral QHS  . warfarin  7.5 mg Oral ONCE-1800  . Warfarin - Pharmacist Dosing Inpatient   Does not apply q1800    Continuous Infusions: . sodium chloride 10 mL/hr at 07/31/12 2144  . feeding supplement (JEVITY 1.2 CAL) 1,000 mL (08/01/12 0616)  . heparin 1,650 Units/hr (08/01/12 0459)    Loyce Dys, MS RD LDN Clinical Inpatient Dietitian Pager: [phone] Weekend/After hours pager: [phone]

## 2012-08-01 NOTE — Progress Notes (Signed)
PULMONARY  / CRITICAL CARE MEDICINE  Name: Victor Durham MRN: [id number] DOB: 02/19/1956    ADMISSION DATE:  07/10/2012 CONSULTATION DATE:  1/30/20143  REFERRING MD :  Juleen China  CHIEF COMPLAINT:  Dysphagia  BRIEF PATIENT DESCRIPTION: 57 y/o homeless male presented to the [facility] ED On 1/30 for the third time in two weeks for evaluation of dysphagia.  He complained of some difficulty breathing and underwent a neck CT.  Shortly after that procedure he developed respiratory and cardiac arrest.  PCCM called for admission.  SIGNIFICANT EVENTS / STUDIES:  1/30 respiratory and cardiac arrest in ED 1/30 CT head: hypoattenuation R midbrain extending into L pons, poorly characterized due to streak artifact 130 Echocardiogram: LVEF normal. Mild to moderate RV dilatation 1/31 EGD gastritis and esophagitis but no mass or bleeding source 1/31 LE venous duplex scans: LLE DVT - heparin started 1/31 Thick secretions -plugged ETT during MRI 1/31 MRI -Scattered subcortical T2 hyperintensities -nonspecific, chronic microvascular ischemia vs a demyelinating process  2/1 Neurology consult: concern for myasthenia gravis 2/1 CT chest - no thymoma or central PE, Bilateral lower lobe consolidation and atelectasis , Nonspecific enhancing solid 4.1 cm mass exophytic from segment 2 of the left hepatic lobe  2/2 - Mestinon started - intolerant due to excessive secretions 2/3 - IVIG initiated by Neuro 2/4 - Acetylcholine Receptor Ab 127.00 (<=0.30 mmol/L) 2/4 - MRI cervical spine >> normal 2/4 - Mestinon retried at a decreased dose 2-4 - tx to cone. 2-7 - Bronchoscopy 2/9 - extubated 2/10- re intubated 2/11 - trach 2/13 - peg 2/13 - Last TPE session 2/17 - Warfarin begun  2/19 - tolerated 18 hrs continuous ATC 2/20 - off vent 24 hrs/d 2/21 - remains off vent 24/7  LINES / TUBES: 1/30 ETT >> 2/9 1/30 L [place] CVL >> 2/17 2/5  RtI J HD Cath insertion>>  Out 2/11 trach >>   CULTURES: 1/30 blood >> NEG 1/30  urine >> NEG 2-2 sputum>> NEG 2-1 bc x 2 >> NEG 2-7 BAL >> oral flora 2-11 BAL >> NOF 2/15 C diff >> NEG  ANTIBIOTICS: unasyn 1/30 >> 2/4 levaquin 1/30 >> 2/4  SUBJECTIVE: Feels great today. No new complaints. No distress    VITAL SIGNS: Temp:  [97.5 F (36.4 C)-98.3 F (36.8 C)] 97.6 F (36.4 C) (02/21 1945) Pulse Rate:  [48-76] 59 (02/21 1928) Resp:  [7-24] 15 (02/21 1928) BP: (94-116)/(51-88) 103/57 mmHg (02/21 1928) SpO2:  [95 %-100 %] 100 % (02/21 1928) FiO2 (%):  [28 %] 28 % (02/21 1928)  VENTILATOR SETTINGS: Vent Mode:  [-]  FiO2 (%):  [28 %] 28 %  INTAKE / OUTPUT: Intake/Output     02/21 0701 - 02/22 0700   I.V. (mL/kg) 318 (4.4)   Other    NG/GT    Total Intake(mL/kg) 318 (4.4)   Urine (mL/kg/hr)    Total Output     Net +318         PHYSICAL EXAMINATION: Gen: NAD HEENT: L>R lid lag PULM: no wheezing CV: regular, no murmur AB: Soft, non tender Ext: no edema Neuro: diffusely weak  LABS:  Recent Labs Lab 07/27/12 0215 07/28/12 0440  07/30/12 0445 07/31/12 0600 08/01/12 0517  HGB 12.6* 12.9*  < > 12.3* 12.3* 12.4*  WBC 7.9 6.6  < > 5.8 5.1 4.8  PLT 202 189  < > 160 158 136*  NA 133* 138  --   --   --  139  K 4.2 3.7  --   --   --  4.2  CL 97 101  --   --   --  102  CO2 26 24  --   --   --  31  GLUCOSE 88 121*  --   --   --  88  BUN 24* 27*  --   --   --  19  CREATININE 0.55 0.53  --   --  0.51 0.51  CALCIUM 9.7 9.6  --   --   --  9.6  INR  --   --   < > 1.18 1.48 1.75*  < > = values in this interval not displayed. No results found for this basename: GLUCAP,  in the last 168 hours  CXR: No new film  ASSESSMENT / PLAN:  PULMONARY A:  Acute respiratory failure due to Myasthenia Gravis Aspiration PNA, resolved Tracheostomy status Nocturnal vent dependence  P:   Cont ATC as long as tolerated > needs to go 48-72 hrs off vent prior to transfer out of SDU  CARDIOVASCULAR A:  LLE DVT  P:  Cont Heparin gtt until INR > 2.0 X  days Cont warfarin    RENAL A:   Volume overload, resolved Mild hypokalemia, resolved  P:   Monitor I/Os, BMET intermittently K supp as needed  GASTROINTESTINAL A:   Dysphagia due to MG. S/P G tube   P: Cont TFs   HEMATOLOGIC A:  Anemia of critical illness. Thrombocytopenia, resolved  P:  Monitor CBC intermittently  INFECTIOUS A:   Aspiration PNA, treated  P:   Cont to monitor  ENDOCRINE A:   Hyperglycemia, mild  P:   Resume SSI if needed for glu > 180  NEUROLOGIC A:  Myasthenia Gravis Anxiety  P:   Mgmt per Neuro Cont PT/OT Cervical collar for head support while ambulating  Family contact:  John Maddalena [name] [phone]; back up contact for father: [name]: [phone]     [name], MD;  PCCM service; [place] [phone]

## 2012-08-01 NOTE — Progress Notes (Signed)
Subjective: Continues to report no increase in secretions.   Exam: Filed Vitals:   08/01/12 0600  BP: 94/51  Pulse: 62  Temp:   Resp: 14   Gen: In bed, NAD MS: Awake, Alert, writes questions on pad, follows commands briskly.  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  Motor: 5/5 distally, 4/5 and fatigable proximally.  Sensory:Intact to LT.  Impression: 57 yo M with MG and continued symptoms despite PLEX. At this time, he will need chronic immunomodulation. I would start with prednisone, with a slightly more rapid taper than usual due to his receiving PLEX. His mestinon could continue to be increased, but given the problems he had before, would give this dose another day before increasing to 90mg .   Recommendations: 1)Prednisone 20mg  Qday x 3 days, then(2/22) 25mg  qday x 3 days, then 30mg  qday x 3 days, then 35mg  qday x 3 days, etc to a goal of 70 mg/day(1mg /kg/day).  2) continue mestinon 60mg  q4h during day, 180mg  time capsule dose at night.  3) Continue PT, OT.   Ritta Slot, MD [facility] [phone]  If 7pm- 7am, please page neurology on call at [phone].

## 2012-08-01 NOTE — Plan of Care (Signed)
Problem: Phase II Progression Outcomes Goal: Discharge/transfer plan updated Plan discharge to SNF when medically ready.

## 2012-08-01 NOTE — Progress Notes (Signed)
Passy-Muir Speaking Valve - Treatment Patient Details  Name: Victor Durham MRN: [id number] Date of Birth: [birth year]  Today's Date: 08/01/2012 Time: 4098-1191 SLP Time Calculation (min): 16 min  Past Medical History:  Past Medical History  Diagnosis Date  . DVT (deep venous thrombosis)   . Hypertension   . High cholesterol    Past Surgical History:  Past Surgical History  Procedure Laterality Date  . Esophagogastroduodenoscopy  07/11/2012    Procedure: ESOPHAGOGASTRODUODENOSCOPY (EGD);  Surgeon: Shirley Friar, MD;  Location: [facility];  Service: Endoscopy;  Laterality: N/A;  BEDSIDE    Assessment / Plan / Recommendation Clinical Impression  Pt. seen for PMSV treat while alert and sitting in recliner.  SLP deflated cuff resulting in strong coughs enabling pt. to expectorate mucous from trach.  Pt. with continuous cough for several minutes and SLP requested RN deep suction.  SLP donned valve and provided mild verbal cues for deep inhalation prior to phonation.  Unable to redirect air adequatetly to upper airway with a nonfunctional whisper.  No signs of C02 retention.  RR 21,  HR 70, Sp02 94%.   Increased work of breathing and coughing, therefore SLP removed valve and reinflated cuff.  Pt. demonstrating decreased toleration of prolonged cuff deflation with inabiltiy to clear copious secretions and decreased respiratory effort/strength.  Recommend PMSV with SLP only.        Plan  Continue with current plan of care    Follow Up Recommendations  Skilled Nursing facility    Pertinent Vitals/Pain none    SLP Goals Potential to Achieve Goals: Fair SLP Goal #1: Pt will tolerate PMSV with intermittent supervision during all waking hours SLP Goal #1 - Progress: Progressing toward goal   PMSV Trial  PMSV was placed for: 30 second intervals Able to redirect subglottic air through upper airway: No Able to Attain Phonation: No Able to Expectorate Secretions: Yes Level of  Secretion Expectoration with PMSV: Tracheal Breath Support for Phonation: Severely decreased Respirations During Trial: 21 SpO2 During Trial: 94 % Pulse During Trial: 62 Behavior: Alert;Cooperative;Responsive to questions   Tracheostomy Tube       Vent Dependency  FiO2 (%): 28 %    Cuff Deflation Trial  GO     Length of Time for Cuff Deflation Trial: 12 minutes Behavior: Alert;Cooperative;Responsive to questions   [name] M.Ed [employer] [phone]  08/01/2012

## 2012-08-02 DIAGNOSIS — J9600 Acute respiratory failure, unspecified whether with hypoxia or hypercapnia: Secondary | ICD-10-CM

## 2012-08-02 LAB — CBC
Hemoglobin: 12.3 g/dL — ABNORMAL LOW (ref 13.0–17.0)
MCH: 29.9 pg (ref 26.0–34.0)
MCV: 92 fL (ref 78.0–100.0)
RBC: 4.12 MIL/uL — ABNORMAL LOW (ref 4.22–5.81)

## 2012-08-02 LAB — HEPARIN LEVEL (UNFRACTIONATED): Heparin Unfractionated: 0.34 IU/mL (ref 0.30–0.70)

## 2012-08-02 MED ORDER — PYRIDOSTIGMINE BROMIDE 60 MG/5ML PO SYRP
90.0000 mg | ORAL_SOLUTION | ORAL | Status: DC
  Administered 2012-08-02 (×2): 90 mg via ORAL
  Filled 2012-08-02 (×3): qty 7.5

## 2012-08-02 MED ORDER — WARFARIN SODIUM 5 MG PO TABS
5.0000 mg | ORAL_TABLET | Freq: Once | ORAL | Status: AC
  Administered 2012-08-02: 5 mg via ORAL
  Filled 2012-08-02: qty 1

## 2012-08-02 MED ORDER — PYRIDOSTIGMINE BROMIDE 60 MG/5ML PO SYRP
90.0000 mg | ORAL_SOLUTION | Freq: Every day | ORAL | Status: DC
  Administered 2012-08-02 – 2012-08-06 (×18): 90 mg
  Filled 2012-08-02 (×25): qty 7.5

## 2012-08-02 MED ORDER — PANTOPRAZOLE SODIUM 40 MG PO PACK
40.0000 mg | PACK | Freq: Every day | ORAL | Status: DC
  Administered 2012-08-02 – 2012-08-06 (×5): 40 mg
  Filled 2012-08-02 (×6): qty 20

## 2012-08-02 MED ORDER — PREDNISONE 20 MG PO TABS
30.0000 mg | ORAL_TABLET | Freq: Every day | ORAL | Status: DC
  Administered 2012-08-03 – 2012-08-05 (×3): 30 mg via ORAL
  Filled 2012-08-02 (×4): qty 1

## 2012-08-02 MED ORDER — PANTOPRAZOLE SODIUM 40 MG PO TBEC
40.0000 mg | DELAYED_RELEASE_TABLET | Freq: Every day | ORAL | Status: DC
  Filled 2012-08-02: qty 1

## 2012-08-02 NOTE — Progress Notes (Signed)
Subjective: He has not noticed any abdominal pain or increased secretions.   Exam: Filed Vitals:   08/02/12 1139  BP: 113/63  Pulse: 68  Temp:   Resp: 22   Gen: In bed, NAD MS: Awake, Alert, writes questions on pad, follows commands briskly.  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  Motor: 5/5 distally, 4/5 and fatigable proximally.  Sensory:Intact to LT.  Impression: 57 yo M with MG and continued symptoms despite PLEX. At this time, he will need chronic immunomodulation. I would start with prednisone, with a slightly more rapid taper than usual due to his receiving PLEX. His mestinon could continue to be increased, but given the problems he had before, would give this dose another day before increasing to 90mg .   Recommendations: 1) Will increase prednisone today, given that he has had plex, will increase to 30mg  qday 2) Increase mestinon to 90mg  q4h during day, 180mg  time capsule dose at night.  3) Continue PT, OT.  4) If secretions are a problem, could consider glycopyrrolate 1mg  TID as this has muscarinic activity while relatively sparing nicotinic receptors.    Ritta Slot, MD [facility] [phone]  If 7pm- 7am, please page neurology on call at [phone].

## 2012-08-02 NOTE — Progress Notes (Addendum)
ANTICOAGULATION CONSULT NOTE - Follow Up Consult  Pharmacy Consult for heparin and warfarin Indication: DVT  No Known Allergies  Patient Measurements: Height: 6' (182.9 cm) Weight: 158 lb 11.7 oz (72 kg) IBW/kg (Calculated) : 77.6 Heparin Dosing Weight: 75.5kg  Vital Signs: Temp: 98.2 F (36.8 C) (02/22 1520) Temp src: Oral (02/22 1520) BP: 111/77 mmHg (02/22 1545) Pulse Rate: 75 (02/22 1545)  Labs:  Recent Labs  07/31/12 0600 08/01/12 0517 08/02/12 0520 08/02/12 1501  HGB 12.3* 12.4* 12.3*  --   HCT 39.0 37.4* 37.9*  --   PLT 158 136* 142*  --   LABPROT 17.5* 19.8* 21.9*  --   INR 1.48 1.75* 2.00*  --   HEPARINUNFRC 0.64 0.69 0.72* 0.34  CREATININE 0.51 0.51  --   --     Estimated Creatinine Clearance: 105 ml/min (by C-G formula based on Cr of 0.51).   Assessment: [name] with DVT who is on day #6/5 of heparin + warfarin. INR this morning was therapeutic at 2 while heparin level was slightly above goal at 0.72. INR needs to be therapeutic for 24 hours until heparin can be discontinued. Noted patient has had some bloody secretions in the past, but per nurse she has not seen any today. H/H has remained stable, slight drop in platelets yesterday but appear to be rising again.   Goal of Therapy:  INR 2-3 Heparin level 0.3-0.7 units/ml Monitor platelets by anticoagulation protocol: Yes    Plan:  1. Decrease heparin slightly to 1600 units/hr 2. Warfarin 5mg  po x1 tonight 3. 6 hour heparin level 4. Daily PT/INR, HL, CBC 5. Monitor for any signs of bleeding  Lauren D. Bajbus, PharmD Clinical Pharmacist Pager: [phone] 08/02/2012 4:18 PM  Follow up heparin level this afternoon resulted much lower at 0.32, this is unexpected given such a conservative decrease of 50 units/hr. Will recheck HL in am. No further bleeding issues noted, also expect to be able to d/c heparin in am with therapeutic INR.  Victor Durham 08/02/2012 4:22 PM

## 2012-08-02 NOTE — Progress Notes (Signed)
PULMONARY  / CRITICAL CARE MEDICINE  Name: Victor Durham MRN: [id number] DOB: [birth year]    ADMISSION DATE:  07/10/2012 CONSULTATION DATE:  1/30/20143  REFERRING MD :  Juleen China  CHIEF COMPLAINT:  Dysphagia  BRIEF PATIENT DESCRIPTION: 57 y/o homeless male presented to the [facility] ED On 1/30 for the third time in two weeks for evaluation of dysphagia.  He complained of some difficulty breathing and underwent a neck CT.  Shortly after that procedure he developed respiratory and cardiac arrest.  PCCM called for admission.  SIGNIFICANT EVENTS / STUDIES:  1/30 respiratory and cardiac arrest in ED 1/30 CT head: hypoattenuation R midbrain extending into L pons, poorly characterized due to streak artifact 130 Echocardiogram: LVEF normal. Mild to moderate RV dilatation 1/31 EGD gastritis and esophagitis but no mass or bleeding source 1/31 LE venous duplex scans: LLE DVT - heparin started 1/31 Thick secretions -plugged ETT during MRI 1/31 MRI -Scattered subcortical T2 hyperintensities -nonspecific, chronic microvascular ischemia vs a demyelinating process  2/1 Neurology consult: concern for myasthenia gravis 2/1 CT chest - no thymoma or central PE, Bilateral lower lobe consolidation and atelectasis , Nonspecific enhancing solid 4.1 cm mass exophytic from segment 2 of the left hepatic lobe  2/2 - Mestinon started - intolerant due to excessive secretions 2/3 - IVIG initiated by Neuro 2/4 - Acetylcholine Receptor Ab 127.00 (<=0.30 mmol/L) 2/4 - MRI cervical spine >> normal 2/4 - Mestinon retried at a decreased dose 2-4 - tx to cone. 2-7 - Bronchoscopy 2/9 - extubated 2/10- re intubated 2/11 - trach 2/13 - peg 2/13 - Last TPE session 2/17 - Warfarin begun  2/19 - tolerated 18 hrs continuous ATC 2/20 - off vent 24 hrs/d 2/21 - remains off vent 24/7  LINES / TUBES: 1/30 ETT >> 2/9 1/30 L [place] CVL >> 2/17 2/5  RtI J HD Cath insertion>>  Out 2/11 trach >>   CULTURES: 1/30 blood >> NEG 1/30  urine >> NEG 2-2 sputum>> NEG 2-1 bc x 2 >> NEG 2-7 BAL >> oral flora 2-11 BAL >> NOF 2/15 C diff >> NEG  ANTIBIOTICS: unasyn 1/30 >> 2/4 levaquin 1/30 >> 2/4  SUBJECTIVE: No new complaints. No distress. Non-verbal with trach, but responsive and interactive. Denies acute needs.    VITAL SIGNS: Temp:  [97.4 F (36.3 C)-98.3 F (36.8 C)] 98 F (36.7 C) (02/22 0802) Pulse Rate:  [50-70] 59 (02/22 0802) Resp:  [15-25] 18 (02/22 0802) BP: (96-123)/(50-88) 96/60 mmHg (02/22 0802) SpO2:  [94 %-100 %] 98 % (02/22 0802) FiO2 (%):  [28 %] 28 % (02/22 0802)  VENTILATOR SETTINGS: Vent Mode:  [-]  FiO2 (%):  [28 %] 28 %  INTAKE / OUTPUT: Intake/Output     02/21 0701 - 02/22 0700 02/22 0701 - 02/23 0700   I.V. (mL/kg) 583 (8.1)    Other 770    NG/GT     Total Intake(mL/kg) 1353 (18.8)    Urine (mL/kg/hr) 1300 (0.8) 200 (1.1)   Total Output 1300 200   Net +53 -200          PHYSICAL EXAMINATION: Gen: NAD. In recliner chair. HEENT: L>R lid lag PULM: no wheezing, no rhonchi anteriorly CV: regular, no murmur AB: Soft, non tender Ext: no edema Neuro: diffusely weak. Left eye closed. Fair R handshake.  LABS:  Recent Labs Lab 07/27/12 0215 07/28/12 0440  07/31/12 0600 08/01/12 0517 08/02/12 0520  HGB 12.6* 12.9*  < > 12.3* 12.4* 12.3*  WBC 7.9 6.6  < >  5.1 4.8 6.0  PLT 202 189  < > 158 136* 142*  NA 133* 138  --   --  139  --   K 4.2 3.7  --   --  4.2  --   CL 97 101  --   --  102  --   CO2 26 24  --   --  31  --   GLUCOSE 88 121*  --   --  88  --   BUN 24* 27*  --   --  19  --   CREATININE 0.55 0.53  --  0.51 0.51  --   CALCIUM 9.7 9.6  --   --  9.6  --   INR  --   --   < > 1.48 1.75* 2.00*  < > = values in this interval not displayed. No results found for this basename: GLUCAP,  in the last 168 hours  CXR: No new film  ASSESSMENT / PLAN:  PULMONARY A:  Acute respiratory failure due to Myasthenia Gravis Aspiration PNA, resolved Tracheostomy  status Nocturnal vent dependence  P:   Cont ATC as long as tolerated > needs to go 48-72 hrs off vent prior to transfer out of SDU F/U CXR  CARDIOVASCULAR A:  LLE DVT  P:  Cont Heparin gtt until INR > 2.0 X days. Pharmacy help noted and appreciated. Cont warfarin    RENAL A:   Volume overload, resolved Mild hypokalemia, resolved  P:   Monitor I/Os, BMET intermittently K supp as needed  GASTROINTESTINAL A:   Dysphagia due to MG. S/P G tube  Liver Mass- noted on initial chest CT For outpatient MRI? Consider possibility of paraneoplastic syndrome causing Myasthenia P: Cont TFs   HEMATOLOGIC A:  Anemia of critical illness. Thrombocytopenia, resolved  P:  Monitor CBC intermittently  INFECTIOUS A:   Aspiration PNA, treated  P:   Cont to monitor  ENDOCRINE A:   Hyperglycemia, mild  P:   Resume SSI if needed for glu > 180  NEUROLOGIC A:  Myasthenia Gravis Anxiety  P:   Mgmt per Neuro Cont PT/OT Cervical collar for head support while ambulating  Family contact:  Dejion Gelber [name] [phone]; back up contact for father: [name]: [phone]     CD Maple Hudson, MD PCCM P [phone]

## 2012-08-03 ENCOUNTER — Inpatient Hospital Stay (HOSPITAL_COMMUNITY): Payer: Medicaid Other

## 2012-08-03 LAB — CBC
HCT: 39.7 % (ref 39.0–52.0)
Hemoglobin: 13.2 g/dL (ref 13.0–17.0)
RBC: 4.36 MIL/uL (ref 4.22–5.81)
WBC: 5.6 10*3/uL (ref 4.0–10.5)

## 2012-08-03 LAB — PROTIME-INR
INR: 2.13 — ABNORMAL HIGH (ref 0.00–1.49)
Prothrombin Time: 22.9 seconds — ABNORMAL HIGH (ref 11.6–15.2)

## 2012-08-03 MED ORDER — WARFARIN SODIUM 7.5 MG PO TABS
7.5000 mg | ORAL_TABLET | Freq: Once | ORAL | Status: AC
  Administered 2012-08-03: 7.5 mg via ORAL
  Filled 2012-08-03: qty 1

## 2012-08-03 NOTE — Progress Notes (Signed)
Pt remains on ATC @ 28% tolerating well, no distress noted at this time. RT will monitor

## 2012-08-03 NOTE — Progress Notes (Signed)
ANTICOAGULATION CONSULT NOTE - Follow Up Consult  Pharmacy Consult for heparin and warfarin Indication: DVT  No Known Allergies  Patient Measurements: Height: 6' (182.9 cm) Weight: 158 lb 11.7 oz (72 kg) IBW/kg (Calculated) : 77.6 Heparin Dosing Weight: 75.5kg  Vital Signs: Temp: 97.5 F (36.4 C) (02/23 0800) Temp src: Oral (02/23 0337) BP: 122/57 mmHg (02/23 0838) Pulse Rate: 70 (02/23 0838)  Labs:  Recent Labs  08/01/12 0517 08/02/12 0520 08/02/12 1501 08/03/12 0528  HGB 12.4* 12.3*  --  13.2  HCT 37.4* 37.9*  --  39.7  PLT 136* 142*  --  155  LABPROT 19.8* 21.9*  --  22.9*  INR 1.75* 2.00*  --  2.13*  HEPARINUNFRC 0.69 0.72* 0.34 0.35  CREATININE 0.51  --   --   --     Estimated Creatinine Clearance: 105 ml/min (by C-G formula based on Cr of 0.51).   Assessment: 71 YOM with DVT who has completed 5 days of overlap with heparin + warfarin. INR this morning was therapeutic at 2.13 which is the second therapeutic level. Bloody secretions noted in the past, but no issues noted any more. CBC has trended up. No other bleeding noted.   Goal of Therapy:  INR 2-3 Monitor platelets by anticoagulation protocol: Yes    Plan:  1. Discontinue heparin drip 2. Warfarin 7.5mg  po x1 tonight 3. Daily PT/INR 4. Monitor for any signs of bleeding  Axel Meas D. Adalay Azucena, PharmD Clinical Pharmacist Pager: [phone] 08/03/2012 9:06 AM

## 2012-08-03 NOTE — Progress Notes (Signed)
PULMONARY  / CRITICAL CARE MEDICINE  Name: Victor Durham MRN: [id number] DOB: 09-15-1955    ADMISSION DATE:  07/10/2012 CONSULTATION DATE:  1/30/20143  REFERRING MD :  Juleen China  CHIEF COMPLAINT:  Dysphagia  BRIEF PATIENT DESCRIPTION: 57 y/o homeless male presented to the [facility] ED On 1/30 for the third time in two weeks for evaluation of dysphagia.  He complained of some difficulty breathing and underwent a neck CT.  Shortly after that procedure he developed respiratory and cardiac arrest.  PCCM called for admission.  SIGNIFICANT EVENTS / STUDIES:  1/30 respiratory and cardiac arrest in ED 1/30 CT head: hypoattenuation R midbrain extending into L pons, poorly characterized due to streak artifact 130 Echocardiogram: LVEF normal. Mild to moderate RV dilatation 1/31 EGD gastritis and esophagitis but no mass or bleeding source 1/31 LE venous duplex scans: LLE DVT - heparin started 1/31 Thick secretions -plugged ETT during MRI 1/31 MRI -Scattered subcortical T2 hyperintensities -nonspecific, chronic microvascular ischemia vs a demyelinating process  2/1 Neurology consult: concern for myasthenia gravis 2/1 CT chest - no thymoma or central PE, Bilateral lower lobe consolidation and atelectasis , Nonspecific enhancing solid 4.1 cm mass exophytic from segment 2 of the left hepatic lobe  2/2 - Mestinon started - intolerant due to excessive secretions 2/3 - IVIG initiated by Neuro 2/4 - Acetylcholine Receptor Ab 127.00 (<=0.30 mmol/L) 2/4 - MRI cervical spine >> normal 2/4 - Mestinon retried at a decreased dose 2-4 - tx to cone. 2-7 - Bronchoscopy 2/9 - extubated 2/10- re intubated 2/11 - trach 2/13 - peg 2/13 - Last TPE session 2/17 - Warfarin begun  2/19 - tolerated 18 hrs continuous ATC 2/20 - off vent 24 hrs/d 2/21 - remains off vent 24/7  LINES / TUBES: 1/30 ETT >> 2/9 1/30 L [place] CVL >> 2/17 2/5  RtI J HD Cath insertion>>  Out 2/11 trach >>   CULTURES: 1/30 blood >> NEG 1/30  urine >> NEG 2-2 sputum>> NEG 2-1 bc x 2 >> NEG 2-7 BAL >> oral flora 2-11 BAL >> NOF 2/15 C diff >> NEG  ANTIBIOTICS: unasyn 1/30 >> 2/4 levaquin 1/30 >> 2/4  SUBJECTIVE: Up in chair. Alert to voice. Denies pain or dyspnea. T-bar overnight    VITAL SIGNS: Temp:  [97.5 F (36.4 C)-98.3 F (36.8 C)] 97.5 F (36.4 C) (02/23 0800) Pulse Rate:  [55-80] 70 (02/23 0838) Resp:  [12-25] 17 (02/23 0838) BP: (90-122)/(56-87) 122/57 mmHg (02/23 0838) SpO2:  [90 %-100 %] 100 % (02/23 0838) FiO2 (%):  [28 %-35 %] 28 % (02/23 0838)  VENTILATOR SETTINGS: Vent Mode:  [-]  FiO2 (%):  [28 %-35 %] 28 %  INTAKE / OUTPUT: Intake/Output     02/22 0701 - 02/23 0700 02/23 0701 - 02/24 0700   I.V. (mL/kg) 419 (5.8) 26 (0.4)   Other 1050    NG/GT 70 70   Total Intake(mL/kg) 1539 (21.4) 96 (1.3)   Urine (mL/kg/hr) 1700 (1)    Total Output 1700     Net -161 +96        Stool Occurrence 1 x      PHYSICAL EXAMINATION: Gen: NAD. In recliner chair. Passive HEENT: Bilateral eyelid droop PULM: no wheezing, no rhonchi anteriorly. Trach clean, no stridor CV: regular, no murmur AB: Soft, non tender Ext: no edema Neuro: diffusely weak. Eyelids closed. No effort to move extremities  LABS:  Recent Labs Lab 07/28/12 0440  07/31/12 0600 08/01/12 0517 08/02/12 0520 08/03/12 0528  HGB  12.9*  < > 12.3* 12.4* 12.3* 13.2  WBC 6.6  < > 5.1 4.8 6.0 5.6  PLT 189  < > 158 136* 142* 155  NA 138  --   --  139  --   --   K 3.7  --   --  4.2  --   --   CL 101  --   --  102  --   --   CO2 24  --   --  31  --   --   GLUCOSE 121*  --   --  88  --   --   BUN 27*  --   --  19  --   --   CREATININE 0.53  --  0.51 0.51  --   --   CALCIUM 9.6  --   --  9.6  --   --   INR  --   < > 1.48 1.75* 2.00* 2.13*  < > = values in this interval not displayed. No results found for this basename: GLUCAP,  in the last 168 hours  CXR: Reviewed. Pneumonia resolved, but residual atx/ prominent markings in lower  zones.  ASSESSMENT / PLAN:  PULMONARY A:  Acute respiratory failure due to Myasthenia Gravis Aspiration PNA, resolved Tracheostomy status Nocturnal vent dependence  P:   Cont ATC as long as tolerated > needs to go 48-72 hrs off vent prior to transfer out of SDU He is non-verbal, passive/ weak. ? Passey-Muir? F/U CXR  CARDIOVASCULAR A:  LLE DVT  P:  Bridge completed, heparin dc'd. Pharmacy help noted and appreciated. Cont warfarin    RENAL A:   Volume overload, resolved Mild hypokalemia, resolved  P:   Monitor I/Os, BMET intermittently K supp as needed  GASTROINTESTINAL A:   Dysphagia due to MG. S/P G tube  Liver Mass- noted on initial chest CT For outpatient MRI? Consider possibility of paraneoplastic syndrome causing Myasthenia P: Cont TFs   HEMATOLOGIC A:  Anemia of critical illness. Thrombocytopenia, resolved  P:  Monitor CBC intermittently  INFECTIOUS A:   Aspiration PNA, treated  P:   Cont to monitor  ENDOCRINE A:   Hyperglycemia, mild  P:   Resume SSI if needed for glu > 180  NEUROLOGIC A:  Myasthenia Gravis- Appreciate Neuro following Anxiety  P:   Mgmt per Neuro Cont PT/OT Cervical collar for head support while ambulating  Family contact:  Socrates Ryder [name] [phone]; back up contact for father: [name]: [phone]     CD Maple Hudson, MD PCCM P [phone]

## 2012-08-03 NOTE — Progress Notes (Signed)
Subjective: Feels that the increased dose of mestinon has helped.   Exam: Filed Vitals:   08/03/12 0445  BP:   Pulse: 70  Temp:   Resp: 12   Gen: In bed, NAD MS: Awake, Alert, writes questions on pad, follows commands briskly.  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  Motor: 5/5 distally, 4/5 and fatigable proximally, though slightly better in his legs today than previous days, arms remain relatively unchanged.  Sensory:Intact to LT.  Impression: 58 yo M with MG and continued symptoms despite PLEX. At this time, he will need chronic immunomodulation. I would start with prednisone, with a slightly more rapid taper than usual due to his receiving PLEX. He is tolerating a higher dose of mestinon well.   Recommendations: 1) Continue prednisone 30mg  qday, will increase again in a few days.  2) continue mestinon 90mg  q4h during day, 180mg  time capsule dose at night.  3) Continue PT, OT.  4) If secretions become a problem, would consider glycopyrrolate 1mg  TID as this has muscarinic activity while relatively sparing nicotinic receptors.    Ritta Slot, MD [facility] [phone]  If 7pm- 7am, please page neurology on call at [phone].

## 2012-08-04 LAB — CBC
HCT: 43 % (ref 39.0–52.0)
Hemoglobin: 13.7 g/dL (ref 13.0–17.0)
MCH: 29.6 pg (ref 26.0–34.0)
MCHC: 31.9 g/dL (ref 30.0–36.0)
MCV: 92.9 fL (ref 78.0–100.0)
Platelets: 160 10*3/uL (ref 150–400)
RBC: 4.63 MIL/uL (ref 4.22–5.81)
RDW: 15.5 % (ref 11.5–15.5)
WBC: 5.3 10*3/uL (ref 4.0–10.5)

## 2012-08-04 LAB — PROTIME-INR
INR: 2.24 — ABNORMAL HIGH (ref 0.00–1.49)
Prothrombin Time: 23.8 seconds — ABNORMAL HIGH (ref 11.6–15.2)

## 2012-08-04 MED ORDER — WARFARIN SODIUM 5 MG PO TABS
5.0000 mg | ORAL_TABLET | ORAL | Status: DC
  Administered 2012-08-04: 5 mg via ORAL
  Filled 2012-08-04 (×2): qty 1

## 2012-08-04 MED ORDER — WARFARIN SODIUM 7.5 MG PO TABS
7.5000 mg | ORAL_TABLET | ORAL | Status: DC
  Administered 2012-08-05: 7.5 mg via ORAL
  Filled 2012-08-04: qty 1

## 2012-08-04 NOTE — Progress Notes (Signed)
RT changed Pt #8 cuffed shiley to #6  cuffless shiley. No complications. Vitals stable at this time. RT will continue to monitor.

## 2012-08-04 NOTE — Progress Notes (Signed)
PULMONARY  / CRITICAL CARE MEDICINE  Name: Victor Durham MRN: [id number] DOB: May 13, 1956    ADMISSION DATE:  07/10/2012 CONSULTATION DATE:  1/30/20143  REFERRING MD :  Juleen China  CHIEF COMPLAINT:  Dysphagia  BRIEF PATIENT DESCRIPTION: 57 y/o homeless male presented to the [facility] ED On 1/30 for the third time in two weeks for evaluation of dysphagia.  He complained of some difficulty breathing and underwent a neck CT.  Shortly after that procedure he developed respiratory and cardiac arrest.  PCCM called for admission.  SIGNIFICANT EVENTS / STUDIES:  1/30 respiratory and cardiac arrest in ED 1/30 CT head: hypoattenuation R midbrain extending into L pons, poorly characterized due to streak artifact 130 Echocardiogram: LVEF normal. Mild to moderate RV dilatation 1/31 EGD gastritis and esophagitis but no mass or bleeding source 1/31 LE venous duplex scans: LLE DVT - heparin started 1/31 Thick secretions -plugged ETT during MRI 1/31 MRI -Scattered subcortical T2 hyperintensities -nonspecific, chronic microvascular ischemia vs a demyelinating process  2/1 Neurology consult: concern for myasthenia gravis 2/1 CT chest - no thymoma or central PE, Bilateral lower lobe consolidation and atelectasis , Nonspecific enhancing solid 4.1 cm mass exophytic from segment 2 of the left hepatic lobe  2/2 - Mestinon started - intolerant due to excessive secretions 2/3 - IVIG initiated by Neuro 2/4 - Acetylcholine Receptor Ab 127.00 (<=0.30 mmol/L) 2/4 - MRI cervical spine >> normal 2/4 - Mestinon retried at a decreased dose 2-4 - tx to cone. 2-7 - Bronchoscopy 2/9 - extubated 2/10- re intubated 2/11 - trach 2/13 - peg 2/13 - Last TPE session 2/17 - Warfarin begun  2/19 - tolerated 18 hrs continuous ATC 2/20 - off vent 24 hrs/d 2/21 - remains off vent 24/7  LINES / TUBES: 1/30 ETT >> 2/9 1/30 L [place] CVL >> 2/17 2/5  RtI J HD Cath insertion>>  Out 2/11 trach >>   CULTURES: 1/30 blood >> NEG 1/30  urine >> NEG 2-2 sputum>> NEG 2-1 bc x 2 >> NEG 2-7 BAL >> oral flora 2-11 BAL >> NOF 2/15 C diff >> NEG  ANTIBIOTICS: unasyn 1/30 >> 2/4 levaquin 1/30 >> 2/4  SUBJECTIVE: Up in chair-ambulates with PT. Alert to voice. Denies pain or dyspnea.  Unable to keep head upright    VITAL SIGNS: Temp:  [97.4 F (36.3 C)-98.2 F (36.8 C)] 98.2 F (36.8 C) (02/24 0839) Pulse Rate:  [48-72] 57 (02/24 1130) Resp:  [12-31] 18 (02/24 1130) BP: (88-116)/(56-75) 108/70 mmHg (02/24 0839) SpO2:  [95 %-100 %] 98 % (02/24 1130) FiO2 (%):  [28 %] 28 % (02/24 1130) Weight:  [71.1 kg (156 lb 12 oz)] 71.1 kg (156 lb 12 oz) (02/24 0100)  VENTILATOR SETTINGS: Vent Mode:  [-]  FiO2 (%):  [28 %] 28 %  INTAKE / OUTPUT: Intake/Output     02/23 0701 - 02/24 0700 02/24 0701 - 02/25 0700   I.V. (mL/kg) 263 (3.7)    Other     NG/GT 1540    Total Intake(mL/kg) 1803 (25.4)    Urine (mL/kg/hr) 2125 (1.2)    Total Output 2125     Net -322          Stool Occurrence 1 x      PHYSICAL EXAMINATION: Gen: NAD. In recliner chair. Passive HEENT: Bilateral eyelid droop PULM: no wheezing, no rhonchi anteriorly. Trach clean, no stridor CV: regular, no murmur AB: Soft, non tender Ext: no edema Neuro: diffusely weak. Eyelids closed. No effort to move extremities  LABS:  Recent Labs Lab 07/31/12 0600 08/01/12 0517 08/02/12 0520 08/03/12 0528 08/04/12 0445  HGB 12.3* 12.4* 12.3* 13.2 13.7  WBC 5.1 4.8 6.0 5.6 5.3  PLT 158 136* 142* 155 160  NA  --  139  --   --   --   K  --  4.2  --   --   --   CL  --  102  --   --   --   CO2  --  31  --   --   --   GLUCOSE  --  88  --   --   --   BUN  --  19  --   --   --   CREATININE 0.51 0.51  --   --   --   CALCIUM  --  9.6  --   --   --   INR 1.48 1.75* 2.00* 2.13* 2.24*   No results found for this basename: GLUCAP,  in the last 168 hours  CXR: 2/23 Pneumonia resolved, but residual atx/ prominent markings in lower zones.  ASSESSMENT /  PLAN:  PULMONARY A:  Acute respiratory failure due to Myasthenia Gravis Aspiration PNA, resolved Tracheostomy status Nocturnal vent dependence  P:   Cont ATC as long as tolerated > needs to go 48-72 hrs off vent prior to transfer out of SDU Speech eval for Passey-Muir Downsize to 6 cuffless  CARDIOVASCULAR A:  LLE DVT  P:  Bridge completed, heparin dc'd. Pharmacy help noted and appreciated. Cont warfarin    RENAL A:   Volume overload, resolved Mild hypokalemia, resolved  P:   Monitor I/Os, BMET intermittently K supp as needed  GASTROINTESTINAL A:   Dysphagia due to MG. S/P G tube  Liver Mass- noted on initial chest CT For outpatient MRI? Consider possibility of paraneoplastic syndrome causing Myasthenia P: Cont TFs Swallow eval eventually once stronger   HEMATOLOGIC A:  Anemia of critical illness. Thrombocytopenia, resolved  P:  Monitor CBC intermittently  INFECTIOUS A:   Aspiration PNA, treated  P:   Cont to monitor  ENDOCRINE A:   Hyperglycemia, mild  P:   Resume SSI if needed for glu > 180  NEUROLOGIC A:  Myasthenia Gravis- Appreciate Neuro following Anxiety  P:   Mgmt per Neuro -Pred 30mg  , mestinon to 90mg  q4h during day, 180mg  time capsule dose at night If secretions are a problem, could consider glycopyrrolate 1mg  TID  Cont PT/OT Cervical collar for head support while ambulating  Family contact:  Boykin Heinecke [name] [phone]; back up contact for father: [name]: [phone]    Cyril Mourning MD. [facility]. [place] [facility] Pager [phone] If no response call [phone]

## 2012-08-04 NOTE — Progress Notes (Signed)
ANTICOAGULATION CONSULT NOTE - Follow Up Consult  Pharmacy Consult for warfarin Indication: DVT  No Known Allergies  Patient Measurements: Height: 6' (182.9 cm) Weight: 156 lb 12 oz (71.1 kg) IBW/kg (Calculated) : 77.6 Heparin Dosing Weight: 75.5kg  Vital Signs: Temp: 97.4 F (36.3 C) (02/24 0329) Temp src: Oral (02/24 0329) BP: 116/68 mmHg (02/24 0329) Pulse Rate: 64 (02/24 0329)  Labs:  Recent Labs  08/02/12 0520 08/02/12 1501 08/03/12 0528 08/04/12 0445  HGB 12.3*  --  13.2 13.7  HCT 37.9*  --  39.7 43.0  PLT 142*  --  155 160  LABPROT 21.9*  --  22.9* 23.8*  INR 2.00*  --  2.13* 2.24*  HEPARINUNFRC 0.72* 0.34 0.35  --     Estimated Creatinine Clearance: 103.7 ml/min (by C-G formula based on Cr of 0.51).   Assessment: 23 YOM with DVT who has completed 5 days of overlap with heparin + warfarin. INR this morning was therapeutic at 2.24. Bloody secretions noted in the past, but no issues noted any more. CBC trending up. No other bleeding noted.   Goal of Therapy:  INR 2-3 Monitor platelets by anticoagulation protocol: Yes    Plan:  1. Warfarin 5mg  on Monday, Wednesday and Friday and 7.5mg  on other days. 2. PT/INR on Monday, Wednesday and Friday 3. Monitor for any signs of bleeding  Celedonio Miyamoto, PharmD, BCPS Clinical Pharmacist Pager [phone]  08/04/2012 8:02 AM

## 2012-08-04 NOTE — Progress Notes (Signed)
Physical Therapy Treatment Patient Details Name: ROMA BIERLEIN MRN: [id number] DOB: 28-Dec-1955 Today's Date: 08/04/2012 Time: 6213-0865 PT Time Calculation (min): 24 min  PT Assessment / Plan / Recommendation Comments on Treatment Session  Pt admitted with cardiac arrest, myasthenia gravis, PEG, trach and progressing with continued use of cervical collar. Pt able to demonstrate increased trunk and head control in sitting today with neck extension without support of hands in sitting and with bil UE shoulder flexion. However, once in standing pt no longer able to maintain trunk and neck extension despite cueing and collar use. Pt encouraged to continue mobility with nursing and HEp and to work on trunk strength in sitting.     Follow Up Recommendations  CIR;SNF;Supervision for mobility/OOB     Does the patient have the potential to tolerate intense rehabilitation     Barriers to Discharge        Equipment Recommendations       Recommendations for Other Services    Frequency     Plan Discharge plan needs to be updated;Frequency remains appropriate    Precautions / Restrictions Precautions Precautions: Fall Precaution Comments: trach, peg, use cervical collar to ambulate Required Braces or Orthoses: Cervical Brace Cervical Brace: Other (comment) (with ambulation) Restrictions Weight Bearing Restrictions: No   Pertinent Vitals/Pain No pain    Mobility  Bed Mobility Bed Mobility: Not assessed Transfers Sit to Stand: 4: Min assist;From chair/3-in-1;With armrests Stand to Sit: 4: Min assist;To chair/3-in-1;With armrests Details for Transfer Assistance: cueing for hand placement and sequence. Without cervical collar on pt able to hold head erect in chair and to pull trunk forward with neck extension today without assist of hands. Collar applied to stand and in sitting pt maintains trunk and neck extension Ambulation/Gait Ambulation/Gait Assistance: 4: Min assist Ambulation  Distance (Feet): 120 Feet Assistive device: Rolling walker Ambulation/Gait Assistance Details: cueing for posture, position in RW assist to direct RW to room and cueing for breathing technique as pt becoming anxious with increased distance stating he can't breathe although sats 97% throughout on RA. Pt maintaining thoracic flexion despite collar and can extend for brief periods but returns to flexion with all standing activity Gait Pattern: Step-through pattern;Decreased stride length;Narrow base of support;Trunk flexed Gait velocity: decreased Stairs: No    Exercises General Exercises - Upper Extremity Shoulder Flexion: AROM;Both;15 reps;Seated;AAROM (on RUE AAROM last 5 reps only) General Exercises - Lower Extremity Long Arc Quad: AROM;Both;20 reps;Seated Hip Flexion/Marching: AROM;Both;20 reps;Seated Heel Raises: AROM;Both;20 reps;Seated   PT Diagnosis:    PT Problem List:   PT Treatment Interventions:     PT Goals Acute Rehab PT Goals PT Goal: Sit to Stand - Progress: Progressing toward goal PT Goal: Ambulate - Progress: Progressing toward goal PT Goal: Perform Home Exercise Program - Progress: Progressing toward goal  Visit Information  Last PT Received On: 08/04/12 Assistance Needed: +1    Subjective Data  Subjective: "let's go"   Cognition  Cognition Area of Impairment: Problem solving Arousal/Alertness: Awake/alert Orientation Level: Appears intact for tasks assessed Behavior During Session: [facility] for tasks performed    Balance     End of Session PT - End of Session Equipment Utilized During Treatment: Gait belt;Cervical collar Activity Tolerance: Patient tolerated treatment well Patient left: in chair;with call bell/phone within reach Nurse Communication: Mobility status   GP     Delorse Lek 08/04/2012, 11:25 AM Delaney Meigs, PT [phone]

## 2012-08-04 NOTE — Progress Notes (Signed)
Subjective: No acute changes.   Exam: Filed Vitals:   08/04/12 0839  BP: 108/70  Pulse: 72  Temp: 98.2 F (36.8 C)  Resp: 12   Gen: In bed, NAD MS: Awake, Alert, writes questions on pad, follows commands briskly.  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  Sensory:Intact to LT.  Impression: 57 yo M with MG and continued symptoms despite PLEX. At this time, he will need chronic immunomodulation. I would start with prednisone, with a slightly more rapid taper than usual due to his receiving PLEX. He is tolerating a higher dose of mestinon well.   Recommendations: 1) Continue prednisone 30mg  qday 2) continue mestinon 90mg  5 x daily(can't take time capsule through peg) 4) If secretions become a problem, would consider glycopyrrolate 1mg  TID as this has muscarinic activity while relatively sparing nicotinic receptors.    Ritta Slot, MD [facility] [phone]  If 7pm- 7am, please page neurology on call at [phone].

## 2012-08-04 NOTE — Progress Notes (Signed)
Clinical Social Worker staffed case with MD.   [name], MSW, Theresia Majors [phone]

## 2012-08-04 NOTE — Consult Note (Signed)
Physical Medicine and Rehabilitation Consult Reason for Consult: MG, deconditioning due to respiratory failure Referring Physician:  Dr. Vassie Loll   HPI: Victor Durham is a 57 y.o. male homeless male presented to the [facility] ED on 1/30 for the third time in two weeks for evaluation of dysphagia. He complained of some difficulty breathing and underwent a neck CT. Shortly after that procedure he developed respiratory and cardiac arrest. He was intubated and neurology consulted for work up due to concerns of MG (father with MG). MRI brain with scattered hyperintensity likely due to microvascular ischemia. CT chest negative for thymoma. Acetylcholine receptor antibody-127. Patient was initially intolerant of Mestinon and treated with IVIG but continued with symptoms therefore started on steriods. Mestinon has been resumed and dose being titrated by neurology.  He required trach 2/11 as unable to tolerate extubation and currently tolerating CFS #6.    PEG placed by IVR on 02/13. He was started on coumadin for LLE DVT. MRI C-spine with slight DDD with moderate foraminal stenosis C3/4 and C4/5. Neck brace ordered to help with neck extension and support. He continues with poor core strength with increased oral secretions as well as high levels of anxiety and dyspnea with increased activity. Dr Ferol Luz consulted for input as family reported patient has had problems with paranoia  and ?schizoaffective disorder. SNF search ongoing.  MD, therapy team, CM recommending CIR to decrease burden of care.   Review of Systems  HENT: Negative for hearing loss and neck pain.   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   Cardiovascular: Negative for chest pain.  Gastrointestinal: Negative for heartburn, nausea and abdominal pain.  Musculoskeletal: Negative for myalgias and back pain.  Neurological: Positive for weakness. Negative for dizziness  and headaches.   Past Medical History  Diagnosis Date  . DVT (deep venous thrombosis)   . Hypertension   . High cholesterol    Past Surgical History  Procedure Laterality Date  . Esophagogastroduodenoscopy  07/11/2012    Procedure: ESOPHAGOGASTRODUODENOSCOPY (EGD);  Surgeon: Shirley Friar, MD;  Location: [facility];  Service: Endoscopy;  Laterality: N/A;  BEDSIDE   History reviewed. No pertinent family history.  Social History:  reports that he has never smoked. He does not have any smokeless tobacco history on file. He reports that he does not drink alcohol or use illicit drugs.  Allergies: No Known Allergies Medications Prior to Admission  Medication Sig Dispense Refill  . aspirin 81 MG chewable tablet Chew 81 mg by mouth every morning.         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  Functional History: Prior Function Able to Take Stairs?: Yes Driving: Yes Comments: Difficult to gather information from pt (unwilling vs unable?)  Pt did reports he enjoys working out and is eager to work with therapy. Functional Status:  Mobility: Bed Mobility Bed Mobility: Not assessed Rolling Right: 4: Min assist Rolling Right: Patient Percentage: 70% Rolling Left: 4: Min assist Right Sidelying to Sit: 1: +2 Total assist;With rails Right Sidelying to Sit: Patient Percentage: 70% Left Sidelying  to Sit: 4: Min assist;HOB flat;With rails Sitting - Scoot to Edge of Bed: 4: Min assist Sit to Supine: 4: Min assist;HOB flat Transfers Transfers: Sit to Stand;Stand to [employer] Transfers Sit to Stand: 4: Min assist;From chair/3-in-1;With armrests Sit to  Stand: Patient Percentage: 70% Stand to Sit: 4: Min assist;To chair/3-in-1;With armrests Stand to Sit: Patient Percentage: 70% Stand Pivot Transfers: 1: +2 Total assist Stand Pivot Transfers: Patient Percentage: 70% Ambulation/Gait Ambulation/Gait Assistance: 4: Min assist Ambulation/Gait: Patient Percentage: 70% Ambulation Distance (Feet): 120 Feet Assistive device: Rolling walker Ambulation/Gait Assistance Details: cueing for posture, position in RW assist to direct RW to room and cueing for breathing technique as pt becoming anxious with increased distance stating he can't breathe although sats 97% throughout on RA. Pt maintaining thoracic flexion despite collar and can extend for brief periods but returns to flexion with all standing activity Gait Pattern: Step-through pattern;Decreased stride length;Narrow base of support;Trunk flexed Gait velocity: decreased Stairs: No Wheelchair Mobility Wheelchair Mobility: No  ADL: ADL Grooming: Performed;Wash/dry face;Supervision/safety Where Assessed - Grooming: Unsupported sitting Upper Body Bathing: Simulated;Supervision/safety Where Assessed - Upper Body Bathing: Unsupported sitting Lower Body Bathing: Simulated;Moderate assistance Where Assessed - Lower Body Bathing: Unsupported sitting Upper Body Dressing: Simulated;Supervision/safety Where Assessed - Upper Body Dressing: Unsupported sitting Lower Body Dressing: Simulated;Moderate assistance Where Assessed - Lower Body Dressing: Unsupported sitting Toilet Transfer: Minimal assistance Toilet Transfer Method: Sit to stand Toilet Transfer Equipment: Bedside commode Transfers/Ambulation Related to ADLs: Pt ambulated with min A and +1 for lines and safety ADL Comments: Pt required max encouragement to participate, but was appreciative once he worked with therpies.  Pt. with Aspen collar in room, and it was placed on pt. for head/neck support.  After ambulating, collar removed, and pt able  to maintain head/neck in neutral without neck flexion.  He then performed 10 reps shoulder each UE while maintiaining thoracic and cervical region in neutral flex/ext.  Pt did fatigue with UE exercises  Cognition: Cognition Arousal/Alertness: Awake/alert Orientation Level: Oriented X4 Cognition Overall Cognitive Status: Impaired Area of Impairment: Problem solving Arousal/Alertness: Awake/alert Orientation Level: Appears intact for tasks assessed Behavior During Session: [facility] for tasks performed Current Attention Level: Selective Attention - Other Comments: Pt at times will become distracted with thought he is writing on notepad Memory Deficits: Did not seem to remember his prior situation or did not want to share it. Following Commands: Follows one step commands consistently Safety/Judgement: Decreased awareness of safety precautions;Decreased awareness of need for assistance  Blood pressure 110/63, pulse 70, temperature 98.3 F (36.8 C), temperature source Oral, resp. rate 25, height 6' (1.829 m), weight 71.1 kg (156 lb 12 oz), SpO2 94.00%.  Physical Exam  Nursing note and vitals reviewed. Constitutional: He is oriented to person, place, and time. He appears ill.  HENT:  Head: Normocephalic and atraumatic.  Eyes: Conjunctivae are normal.  Neck: Normal range of motion.  Cardiovascular: Normal rate and regular rhythm.   Pulmonary/Chest: He has rhonchi in the right upper field and the left upper field.  Congested cough.   Abdominal: Soft. Bowel sounds are normal. There is no tenderness.  Musculoskeletal: He exhibits no edema.  Neurological: He is alert and oriented to person, place, and time.  Able to follow basic commands without difficulty. Moves all four.  Able to whisper. Trach not occluded. Strength 3-4 /5 prox to distal upper ext and 3-4 prox to distal lower ext. No sensory deficits.   Skin: Skin is warm.  Dry flaky skin.  Psychiatric:  Slightly anxious    Results for  orders placed during the hospital encounter of 07/10/12 (from the past 24 hour(s))  CBC     Status: None   Collection Time    08/04/12  4:45 AM      Result Value Range   WBC 5.3  4.0 - 10.5 K/uL   RBC 4.63  4.22 - 5.81 MIL/uL   Hemoglobin 13.7  13.0 - 17.0 g/dL   HCT 09.8  11.9 - 14.7 %   MCV 92.9  78.0 - 100.0 fL   MCH 29.6  26.0 - 34.0 pg   MCHC 31.9  30.0 - 36.0 g/dL   RDW 82.9  56.2 - 13.0 %   Platelets 160  150 - 400 K/uL  PROTIME-INR     Status: Abnormal   Collection Time    08/04/12  4:45 AM      Result Value Range   Prothrombin Time 23.8 (*) 11.6 - 15.2 seconds   INR 2.24 (*) 0.00 - 1.49   Dg Chest Port 1 View  08/03/2012  *RADIOLOGY REPORT*  Clinical Data: Aspiration pneumonia.  PORTABLE CHEST - 1 VIEW  Comparison: 07/27/2012  Findings: Tracheostomy unchanged in position.  Support apparatus projects over the apices.  Mild cardiomegaly.  Mild right hemidiaphragm elevation. No pleural effusion or pneumothorax. Improved right base aeration.  Mild volume loss and atelectasis are identified medially.  Minimal left base volume loss as well.  IMPRESSION: Resolved infection/aspiration at the right lung base.  Mild volume loss or atelectasis remaining.   Original Report Authenticated By: Jeronimo Greaves, M.D.     Assessment/Plan: Diagnosis: Myasthenia Gravis 1. Does the need for close, 24 hr/day medical supervision in concert with the patient's rehab needs make it unreasonable for this patient to be served in a less intensive setting? Potentially 2. Co-Morbidities requiring supervision/potential complications: dysphagia, respiratory failure 3. Due to bladder management, bowel management, safety, skin/wound care, disease management, medication administration, pain management and patient education, does the patient require 24 hr/day rehab nursing? Yes 4. Does the patient require coordinated care of a physician, rehab nurse, PT (1-2 hrs/day, 5 days/week), OT (1-2 hrs/day, 5 days/week) and SLP  (1-2 hrs/day, 5 days/week) to address physical and functional deficits in the context of the above medical diagnosis(es)? Potentially Addressing deficits in the following areas: balance, endurance, locomotion, strength, transferring, bowel/bladder control, bathing, dressing, feeding, grooming, speech, swallowing and psychosocial support 5. Can the patient actively participate in an intensive therapy program of at least 3 hrs of therapy per day at least 5 days per week? Yes and Potentially 6. The potential for patient to make measurable gains while on inpatient rehab is good 7. Anticipated functional outcomes upon discharge from inpatient rehab are mod I to supervision with PT, mod I to supervision  with OT, mod I to supervision with SLP. 8. Estimated rehab length of stay to reach the above functional goals is: ?7-10 days 9. Does the patient have adequate social supports to accommodate these discharge functional goals? No 10. Anticipated D/C setting: Home 11. Anticipated post D/C treatments: HH therapy 12. Overall Rehab/Functional Prognosis: good  RECOMMENDATIONS: This patient's condition is appropriate for continued rehabilitative care in the following setting: CIR? Patient has agreed to participate in recommended program. Yes Note that insurance prior authorization may be required for reimbursement for recommended care.  Comment: He will NOT completely reach modified independent goals on inpatient rehab. Therefore, he will need somewhere to stay or someone to be around initially  at discharge. Rehab RN to follow up.   Ivory Broad, MD     08/04/2012

## 2012-08-04 NOTE — Progress Notes (Signed)
Rehab Admissions Coordinator Note:  Patient was screened by Clois Dupes for appropriateness for an Inpatient Acute Rehab Consult. Asked by PT to assess if pt could benefit from an inpatient rehab stay. I spoke with pt's father by phone and he states he is unable to provide care after d/c for pt. Patient reportedly has been homeless pta. Father emphasizes that pt needs psychiatric help as much as anything. Noted psych consult  On 07/28/12. At this time, we are recommending Inpatient Rehab consult to determine if appropriate admission to lessen burden of care with trach, diet upgrade and caregiver support . Patient will still need SNF after any rehab .  Clois Dupes 08/04/2012, 2:36 PM  I can be reached at [phone].

## 2012-08-04 NOTE — Progress Notes (Signed)
Clinical Social Worker spoke with Theme park manager at [facility],  Admissions currently reviewing information.   [name], MSW, [place] [phone]

## 2012-08-05 LAB — CBC
Platelets: 202 10*3/uL (ref 150–400)
RBC: 4.73 MIL/uL (ref 4.22–5.81)
WBC: 8.3 10*3/uL (ref 4.0–10.5)

## 2012-08-05 MED ORDER — PREDNISONE 5 MG PO TABS
35.0000 mg | ORAL_TABLET | Freq: Every day | ORAL | Status: DC
  Administered 2012-08-06: 35 mg via ORAL
  Filled 2012-08-05 (×2): qty 1

## 2012-08-05 NOTE — Progress Notes (Signed)
Subjective: Had no events overnight  Exam: Filed Vitals:   08/05/12 1209  BP: 122/67  Pulse: 72  Temp: 97.5 F (36.4 C)  Resp: 20   Gen: In bed, NAD MS: Awake, Alert, writes questions on pad, follows commands briskly.  PI:RJJOA, left eye is externally deviated and ptotic(old injury), right eye has full movements with the exception of a possible mild adduction deficit, Palate elevates slightly Motor: 5/5 distally, 4/5 and fatigable proximally, though improving some in legs.  Sensory:Intact to LT.  Impression: 57 yo M with MG and continued symptoms despite PLEX. At this time, he will need chronic immunomodulation. I would start with prednisone, with a slightly more rapid taper than usual due to his receiving PLEX. He is tolerating a higher dose of mestinon well.   Recommendations: 1) Increase prednisone to 35mg  tomorrow, would continue titrating to goal 70 mg/day.  2) continue mestinon 90mg  5 x daily(can't take time capsule through peg), once able to take PO, would change hs dose to Mestinon(XR) time capsule 180mg .  4) If secretions become a problem, would consider glycopyrrolate 1mg  TID as this has muscarinic activity while relatively sparing nicotinic receptors.    Ritta Slot, MD [facility] [phone]  If 7pm- 7am, please page neurology on call at [phone].

## 2012-08-05 NOTE — Progress Notes (Signed)
Clinical Social Worker received notification from [place] at [facility] that they are currently reviewing information.   [name], MSW, [place] [phone]

## 2012-08-05 NOTE — Progress Notes (Signed)
PT complaining of shortness of breath. He was suctioned several times. Homero Fellers bagged him and lavaged him. We suctioned again and turned his ATC to 10l and 98%  He improved and his heart rate dropped from 117 to 92

## 2012-08-05 NOTE — Progress Notes (Addendum)
Discussed pt w/ his SW, [place]. Rehab MD says pt will not reach Modified I during Rehab stay & will need 24/7 assist at d/c.  Concern for CIR: pt needs someone(s) identified to provide 24/7 assist for him at d/c, and, there is no one. Father says he is unable.  Father says pt's wife, who is alive, is separated from him.  Therapies recommend SNF d/t limited participation.  At this time d/c plan needs to be SNF.  CIR will sign off.  [phone]

## 2012-08-05 NOTE — Progress Notes (Signed)
Occupational Therapy Treatment Patient Details Name: Victor Durham MRN: [id number] DOB: 03/24/1956 Today's Date: 08/05/2012 Time: 1478-2956 OT Time Calculation (min): 16 min  OT Assessment / Plan / Recommendation Comments on Treatment Session Pt limited today by fatigue.  Sitting in chair on arrival but declining ambulation to sink for grooming tasks.    Required max encouragement for participation.    Follow Up Recommendations  SNF    Barriers to Discharge       Equipment Recommendations  None recommended by OT    Recommendations for Other Services    Frequency Min 2X/week   Plan Discharge plan remains appropriate    Precautions / Restrictions Precautions Precautions: Fall Precaution Comments: trach, peg, cervical collar to ambulate Required Braces or Orthoses: Cervical Brace   Pertinent Vitals/Pain See vitals    ADL  Grooming: Performed;Wash/dry face;Brushing hair;Set up Where Assessed - Grooming: Supported sitting Transfers/Ambulation Related to ADLs: Pt declining to ambulate to sink for grooming task.  ADL Comments: Pt requiring encouragement today to participate in session (had recently finished with PT).  Performed grooming task sitting in chair and bil UE exercises.  Pt performed bil shoulder flexion/ext and abduction/adduction with min cueing to maintain neutral position/alignment.  Pt quickly fatigued and waved therapist off indicating he was done.    OT Diagnosis:    OT Problem List:   OT Treatment Interventions:     OT Goals ADL Goals Pt Will Perform Grooming: Standing at sink;with modified independence ADL Goal: Grooming - Progress: Goal set today  Visit Information  Last OT Received On: 08/05/12 Assistance Needed: +1    Subjective Data      Prior Functioning       Cognition  Cognition Arousal/Alertness: Awake/alert Orientation Level: Appears intact for tasks assessed Behavior During Session: Flat affect    Mobility  Bed Mobility Bed  Mobility: Not assessed Transfers Transfers: Not assessed    Exercises  General Exercises - Upper Extremity Shoulder Flexion: AROM;Both;5 reps;Seated Shoulder Extension: AROM;Both;5 reps;Seated Shoulder ABduction: AROM;Both;5 reps;Seated Shoulder ADduction: AROM;Both;5 reps;Seated    Balance     End of Session OT - End of Session Equipment Utilized During Treatment: Cervical collar Activity Tolerance: Patient limited by fatigue Patient left: in chair;with call bell/phone within reach  GO    08/05/2012 Victor Durham OTR/L Pager [phone] Office [phone]  Victor Durham 08/05/2012, 10:51 AM

## 2012-08-05 NOTE — Progress Notes (Signed)
PULMONARY  / CRITICAL CARE MEDICINE  Name: Victor Durham MRN: [id number] DOB: [birth year]    ADMISSION DATE:  07/10/2012 CONSULTATION DATE:  1/30/20143  REFERRING MD :  Victor Durham  CHIEF COMPLAINT:  Dysphagia  BRIEF PATIENT DESCRIPTION: 57 y/o homeless male presented to the [facility] ED On 1/30 for the third time in two weeks for evaluation of dysphagia.  He complained of some difficulty breathing and underwent a neck CT.  Shortly after that procedure he developed respiratory and cardiac arrest.  PCCM called for admission.  SIGNIFICANT EVENTS / STUDIES:  1/30 respiratory and cardiac arrest in ED 1/30 CT head: hypoattenuation R midbrain extending into L pons, poorly characterized due to streak artifact 130 Echocardiogram: LVEF normal. Mild to moderate RV dilatation 1/31 EGD gastritis and esophagitis but no mass or bleeding source 1/31 LE venous duplex scans: LLE DVT - heparin started 1/31 Thick secretions -plugged ETT during MRI 1/31 MRI -Scattered subcortical T2 hyperintensities -nonspecific, chronic microvascular ischemia vs a demyelinating process  2/1 Neurology consult: concern for myasthenia gravis 2/1 CT chest - no thymoma or central PE, Bilateral lower lobe consolidation and atelectasis , Nonspecific enhancing solid 4.1 cm mass exophytic from segment 2 of the left hepatic lobe  2/2 - Mestinon started - intolerant due to excessive secretions 2/3 - IVIG initiated by Neuro 2/4 - Acetylcholine Receptor Ab 127.00 (<=0.30 mmol/L) 2/4 - MRI cervical spine >> normal 2/4 - Mestinon retried at a decreased dose 2-4 - tx to cone. 2-7 - Bronchoscopy 2/9 - extubated 2/10- re intubated 2/11 - trach 2/13 - peg 2/13 - Last TPE session 2/17 - Warfarin begun  2/19 - tolerated 18 hrs continuous ATC 2/20 - off vent 24 hrs/d 2/21 - remains off vent 24/7  LINES / TUBES: 1/30 ETT >> 2/9 1/30 L  CVL >> 2/17 2/5  RtI J HD Cath insertion>>  Out 2/11 trach >> 2/24 -6 cuffless  CULTURES: 1/30  blood >> NEG 1/30 urine >> NEG 2-2 sputum>> NEG 2-1 bc x 2 >> NEG 2-7 BAL >> oral flora 2-11 BAL >> NOF 2/15 C diff >> NEG  ANTIBIOTICS: unasyn 1/30 >> 2/4 levaquin 1/30 >> 2/4  SUBJECTIVE: Up in chair-ambulates with PT. Alert to voice. Denies pain or dyspnea.      VITAL SIGNS: Temp:  [97.5 F (36.4 C)-98.3 F (36.8 C)] 97.5 F (36.4 C) (02/25 1209) Pulse Rate:  [56-98] 72 (02/25 1209) Resp:  [16-26] 20 (02/25 1209) BP: (104-132)/(61-80) 122/67 mmHg (02/25 1209) SpO2:  [89 %-99 %] 98 % (02/25 1209) FiO2 (%):  [28 %-98 %] 60 % (02/25 1209)  VENTILATOR SETTINGS: Vent Mode:  [-]  FiO2 (%):  [28 %-98 %] 60 %  INTAKE / OUTPUT: Intake/Output     02/24 0701 - 02/25 0700 02/25 0701 - 02/26 0700   I.V. (mL/kg) 497 (7) 3 (0)   Other 840    NG/GT 210    Total Intake(mL/kg) 1547 (21.8) 3 (0)   Urine (mL/kg/hr) 1800 (1.1) 300 (0.8)   Total Output 1800 300   Net -253 -297          PHYSICAL EXAMINATION: Gen: NAD. In recliner chair. Passive HEENT: Bilateral eyelid droop PULM: no wheezing, no rhonchi anteriorly. Trach clean, no stridor CV: regular, no murmur AB: Soft, non tender Ext: no edema Neuro: diffusely weak. Eyelids closed. No effort to move extremities  LABS:  Recent Labs Lab 07/31/12 0600 08/01/12 0517 08/02/12 0520 08/03/12 0528 08/04/12 0445 08/05/12 0604  HGB 12.3* 12.4*  12.3* 13.2 13.7 14.5  WBC 5.1 4.8 6.0 5.6 5.3 8.3  PLT 158 136* 142* 155 160 202  NA  --  139  --   --   --   --   K  --  4.2  --   --   --   --   CL  --  102  --   --   --   --   CO2  --  31  --   --   --   --   GLUCOSE  --  88  --   --   --   --   BUN  --  19  --   --   --   --   CREATININE 0.51 0.51  --   --   --   --   CALCIUM  --  9.6  --   --   --   --   INR 1.48 1.75* 2.00* 2.13* 2.24*  --    No results found for this basename: GLUCAP,  in the last 168 hours  CXR: 2/23 Pneumonia resolved, but residual atx/ prominent markings in lower zones.  ASSESSMENT /  PLAN:  PULMONARY A:  Acute respiratory failure due to Myasthenia Gravis Aspiration PNA, resolved Tracheostomy status Nocturnal vent dependence  P:   Cont ATC as long as tolerated > needs to go 48-72 hrs off vent prior to transfer out of SDU Speech eval for Passey-Muir Downsized to 6 cuffless  CARDIOVASCULAR A:  LLE DVT  P:  Bridge completed, heparin dc'd. Pharmacy help noted and appreciated. Cont warfarin    RENAL A:   Volume overload, resolved Mild hypokalemia, resolved  P:   Monitor I/Os, BMET intermittently K supp as needed  GASTROINTESTINAL A:   Dysphagia due to MG. S/P G tube  Liver Mass- noted on initial chest CT For outpatient MRI? Consider possibility of paraneoplastic syndrome causing Myasthenia P: Cont TFs Swallow eval eventually once stronger   HEMATOLOGIC A:  Anemia of critical illness. Thrombocytopenia, resolved  P:  Monitor CBC intermittently  INFECTIOUS A:   Aspiration PNA, treated  P:   Cont to monitor  ENDOCRINE A:   Hyperglycemia, mild  P:   Resume SSI if needed for glu > 180  NEUROLOGIC A:  Myasthenia Gravis- Appreciate Neuro following Anxiety  P:   Mgmt per Neuro -Pred 30mg  , mestinon to 90mg  q4h during day, 180mg  time capsule dose at night If secretions are a problem, could consider glycopyrrolate 1mg  TID  Cont PT/OT Cervical collar for head support while ambulating   GLOBAL - Plan for LTAC vs SNF Family contact:  Victor Vejar [name] [phone]; back up contact for father: Victor Durham: [phone]    Cyril Mourning MD. [facility]. [place] [facility] Pager [phone] If no response call [phone]

## 2012-08-05 NOTE — Progress Notes (Signed)
Clinical Social Worker received notification from [place] that [facility] that they are able to accept pt for SNF.  CSW to continue to follow and assist as needed.   [name], MSW, [place] [phone]

## 2012-08-05 NOTE — Progress Notes (Signed)
Clinical Social Worker staffed case with MD and CIR representative.  CSW reviewed case with Chiropodist.  CSW has made several attempts to contact [facility]; phone rings continuously with no answer.  CSW to continue to attempt to contact facility.    [name], MSW, [place] [phone]

## 2012-08-05 NOTE — Progress Notes (Signed)
Physical Therapy Treatment Patient Details Name: Victor Durham MRN: [id number] DOB: Apr 02, 1956 Today's Date: 08/05/2012 Time: 4098-1191 PT Time Calculation (min): 26 min  PT Assessment / Plan / Recommendation Comments on Treatment Session  Pt with cardiac arrest, myasthenia gravis, PEG, trach and decreased mobility today due to increased secretions and pt reporting that he feels SOB and not up for activity. Pt initially agreeable to attempting short ambulation after being suctioned but once suctioned did not want to ambulate and only agreed to HEP and standing for pericare. Will continue to follow. Pt noted to have small area of sacral skin breakdown and pt notified of need for pressure relief and not to maintain sitting in the chair for long periods.     Follow Up Recommendations        Does the patient have the potential to tolerate intense rehabilitation     Barriers to Discharge        Equipment Recommendations       Recommendations for Other Services    Frequency     Plan Discharge plan remains appropriate;Frequency remains appropriate    Precautions / Restrictions Precautions Precautions: Fall Precaution Comments: trach, peg, cervical collar to ambulate Required Braces or Orthoses: Cervical Brace   Pertinent Vitals/Pain Pain at trach site 3/10    Mobility  Bed Mobility Bed Mobility: Not assessed Transfers Sit to Stand: 4: Min assist;From chair/3-in-1 Stand to Sit: 4: Min assist;To chair/3-in-1;With armrests Details for Transfer Assistance: cueing for hand placement and sequence with two attempts and assist for anterior translation before pt able to achieve standing. collar applied prior to standing. pt able to maintain erect posture in static standing 1 min today during pericare Ambulation/Gait Ambulation/Gait Assistance: Not tested (comment) (pt denied attempting today)    Exercises General Exercises - Lower Extremity Long Arc Quad: AROM;Both;Seated;Other reps  (comment) (25reps) Hip Flexion/Marching: AROM;Both;Other reps (comment);Seated (25reps) Toe Raises: AROM;Both;Other reps (comment);Seated (25reps) Heel Raises: AROM;Both;Other reps (comment);Seated (25reps)   PT Diagnosis:    PT Problem List:   PT Treatment Interventions:     PT Goals Acute Rehab PT Goals PT Goal: Sit to Stand - Progress: Progressing toward goal PT Goal: Perform Home Exercise Program - Progress: Progressing toward goal  Visit Information  Last PT Received On: 08/05/12 Assistance Needed: +1    Subjective Data  Subjective: I don't want to walk today   Cognition  Cognition Arousal/Alertness: Awake/alert Orientation Level: Appears intact for tasks assessed Behavior During Session: Flat affect    Balance     End of Session PT - End of Session Activity Tolerance: Patient limited by pain Patient left: in chair   GP     Toney Sang Beth 08/05/2012, 10:19 AM Delaney Meigs, PT [phone]

## 2012-08-06 ENCOUNTER — Encounter (HOSPITAL_COMMUNITY): Payer: Self-pay | Admitting: Pulmonary Disease

## 2012-08-06 LAB — CBC
HCT: 43.9 % (ref 39.0–52.0)
Hemoglobin: 14.6 g/dL (ref 13.0–17.0)
RBC: 4.77 MIL/uL (ref 4.22–5.81)
RDW: 15.5 % (ref 11.5–15.5)
WBC: 8.2 10*3/uL (ref 4.0–10.5)

## 2012-08-06 LAB — PROTIME-INR: INR: 2.6 — ABNORMAL HIGH (ref 0.00–1.49)

## 2012-08-06 MED ORDER — JEVITY 1.2 CAL PO LIQD: 1000.0000 mL | ORAL | Status: DC

## 2012-08-06 MED ORDER — ALBUTEROL SULFATE (5 MG/ML) 0.5% IN NEBU: 2.5000 mg | INHALATION_SOLUTION | Freq: Three times a day (TID) | RESPIRATORY_TRACT | Status: DC

## 2012-08-06 MED ORDER — BIOTENE DRY MOUTH MT LIQD: 15.0000 mL | Freq: Four times a day (QID) | OROMUCOSAL | Status: DC

## 2012-08-06 MED ORDER — PREDNISONE 5 MG PO TABS: 35.0000 mg | ORAL_TABLET | Freq: Every day | ORAL | Status: DC

## 2012-08-06 MED ORDER — PYRIDOSTIGMINE BROMIDE 60 MG/5ML PO SYRP: 90.0000 mg | ORAL_SOLUTION | Freq: Every day | ORAL | Status: DC

## 2012-08-06 MED ORDER — PANTOPRAZOLE SODIUM 40 MG PO PACK: 40.0000 mg | PACK | Freq: Every day | ORAL | Status: DC

## 2012-08-06 MED ORDER — ALPRAZOLAM 0.5 MG PO TABS: 0.5000 mg | ORAL_TABLET | ORAL | Status: DC | PRN

## 2012-08-06 MED ORDER — CHLORHEXIDINE GLUCONATE 0.12 % MT SOLN: 15.0000 mL | Freq: Two times a day (BID) | OROMUCOSAL | Status: DC

## 2012-08-06 MED ORDER — TRAZODONE HCL 100 MG PO TABS: 100.0000 mg | ORAL_TABLET | Freq: Every day | ORAL | Status: DC

## 2012-08-06 MED ORDER — WARFARIN SODIUM 7.5 MG PO TABS: 7.5000 mg | ORAL_TABLET | ORAL | Status: DC

## 2012-08-06 MED ORDER — WARFARIN SODIUM 5 MG PO TABS: 5.0000 mg | ORAL_TABLET | ORAL | Status: DC

## 2012-08-06 NOTE — Progress Notes (Signed)
Clinical Social Worker left another voicemail message with [facility] to provide pt's Pending Medicaid ID number: [id number] Q.  CSW provided pending Medicaid Number to Marylene Land at [facility] and requested Marylene Land to assist with follow up.  CSW provided LOG form to [facility].  CSW met with pt and reviewed dc plan to [facility]; pt agreeable and requests this CSW to phone his father to inform him of transfer.  CSW left voicemail message with pt's father.      [name], MSW, [place] [phone]

## 2012-08-06 NOTE — Progress Notes (Signed)
Patient is for discharge to [facility] and rehab.  Report given to Adella Hare, LPN.

## 2012-08-06 NOTE — Progress Notes (Signed)
CAlled Mr. Victor Durham, [name] And left a message that his son will be [facility].

## 2012-08-06 NOTE — Progress Notes (Signed)
ANTICOAGULATION CONSULT NOTE - Follow Up Consult  Pharmacy Consult for Coumadin Indication: DVT  No Known Allergies  Patient Measurements: Height: 6' (182.9 cm) Weight: 156 lb 12 oz (71.1 kg) IBW/kg (Calculated) : 77.6 Heparin Dosing Weight:   Vital Signs: Temp: 97.7 F (36.5 C) (02/26 0745) Temp src: Oral (02/26 0745) BP: 112/72 mmHg (02/26 0745) Pulse Rate: 65 (02/26 0745)  Labs:  Recent Labs  08/04/12 0445 08/05/12 0604 08/06/12 0514  HGB 13.7 14.5 14.6  HCT 43.0 43.2 43.9  PLT 160 202 188  LABPROT 23.8*  --  26.6*  INR 2.24*  --  2.60*    Estimated Creatinine Clearance: 103.7 ml/min (by C-G formula based on Cr of 0.51).   Assessment: 57 y/o homeless male presented to the [facility] ED On 07/10/12 for the 3rd time/2wks with c/o dysphagia >> respiratory arrest, transferred to [facility].   Anticoagulation: Warfarin for DVT (overlap with heparin complete). INR 2.6. No more bloody secretions noted from G-Tube. Educated 2/18. CBC perfectly stable. Plan - 5mg  on MWF, 7.5 on TThSS, MWF INR  Goal of Therapy:  INR 2-3 Monitor platelets by anticoagulation protocol: Yes   Plan:  Coumadin 5mg  on MWF, 7.5 mg on TThSS MWF INR   Merilynn Finland, [employer] 08/06/2012,8:28 AM

## 2012-08-06 NOTE — Progress Notes (Signed)
Clinical Social Worker staffed case with MD.  Pt ready for dc.  CSW updated pt.  Pt stated he "didn't like" [place] area but could not give a specific reason.  CSW explained SNF was not a long term plan and pt could transfer back to [facility] if/when a bed becomes available.  Pt stated understanding.  CSW spoke with [place] at [facility] and they are now "considering" pt pending a "cost out".  CSW emphasized that pt had been accepted yesterday per conversation with [place] and the need to communicate with this CSW as soon as a decision has been reached post cost out to assist with appropriate dc planning related to patient care.  Clydene Pugh states she will phone this CSW back shortly.   [name], MSW, [place] [phone]

## 2012-08-06 NOTE — Progress Notes (Signed)
Changed PT water bottle and set up. PT did not want to be suctioned

## 2012-08-06 NOTE — Progress Notes (Signed)
Clinical Social Worker left message with Carollee Herter at [facility] to provide pt's pending Medicaid number.  CSW inquired if this would suffice for pt's appointment and co-pay needs.     [name], MSW, [place] [phone]

## 2012-08-06 NOTE — Progress Notes (Signed)
Clinical Child psychotherapist staffed case with Orth[facility].  CSW spoke with Marylene Land at [facility] are able to accept pt today.  CSW to continue to follow and assist as needed.   [name], MSW, [place] [phone]

## 2012-08-06 NOTE — Progress Notes (Signed)
NEURO HOSPITALIST PROGRESS NOTE   SUBJECTIVE:                                                                                                                        Offers no new neurological complains. On mestinon 90 mg 5 x day via PEG. Prednisone increased to 35 mg today. Nursing staff and patient report no side effects at this moment.      OBJECTIVE:                                                                                                                           Vital signs in last 24 hours: Temp:  [97.3 F (36.3 C)-97.8 F (36.6 C)] 97.7 F (36.5 C) (02/26 0745) Pulse Rate:  [56-86] 65 (02/26 0745) Resp:  [16-27] 20 (02/26 0745) BP: (87-127)/(67-74) 112/72 mmHg (02/26 0745) SpO2:  [93 %-99 %] 94 % (02/26 0834) FiO2 (%):  [28 %-60 %] 28 % (02/26 0834)  Intake/Output from previous day: 02/25 0701 - 02/26 0700 In: 1443 [I.V.:463; NG/GT:140] Out: 1151 [Urine:1150; Stool:1] Intake/Output this shift: Total I/O In: 3 [I.V.:3] Out: 250 [Urine:250] Nutritional status: NPO  Past Medical History  Diagnosis Date  . DVT (deep venous thrombosis)   . Hypertension   . High cholesterol     Neurologic ROS negative with exception of above.   Neurologic Exam:  Mental status: alert and awake. Follows commands consistently.  CN 2-12: significant for bilateral, left greater than right eyelid ptosis (improved), with externally deviated left eye (old injury), and possible mild adduction deficit right eye. Motor: mild proximal weakness upper extremities. Sensory: intact to light touch. DTR's" 1+ all over. Plantars: downgoing. Coordination and gait: no tested.  Lab Results: Lab Results  Component Value Date/Time   CHOL 223* 06/27/2009  8:22 PM   Lipid Panel No results found for this basename: CHOL, TRIG, HDL, CHOLHDL, VLDL, LDLCALC,  in the last 72 hours  Studies/Results: No results found.  MEDICATIONS  I have reviewed the patient's current medications.  ASSESSMENT/PLAN:                                                                                                           Neurologically stable at this moment on a combination of mestinon 90 mg 5 times daily (no problems with increased secretions) and prednisone 35 mg daily. Continue current treatment. Will follow up with you.  Wyatt Portela, MD [facility] [phone]  08/06/2012, 9:09 AM

## 2012-08-06 NOTE — Discharge Summary (Signed)
Physician Discharge Summary  Patient ID: Victor Durham MRN: [id number] DOB/AGE: [birth year] 57 y.o.  Admit date: 07/10/2012 Discharge date: 08/06/2012    Discharge Diagnoses:  Acute respiratory failure Aspiration PNA Tracheostomy status  Neuromuscular disorder Myasthenia gravis Anxiety Cardiac arrest Dysphagia Protein Calorie Malnutrition DVT LLE (deep venous thrombosis) Anemia of Critical Illness Thrombocytopenia Liver mass    Brief Summary: Victor Durham is a 57 y.o. y/o homeless male with a PMH of HTN, HLD, initially seen in [facility] for the time in two weeks for evaluation of dysphagia. He complained of some difficulty breathing and underwent a neck CT. Shortly after that procedure he developed respiratory and cardiac arrest requiring ACLS / intubation.  Initial arrest thought secondary to hypoxemia.  He was intubated and neurology consulted for work up due to concerns of MG (father with MG). MRI brain with scattered hyperintensity likely due to microvascular ischemia. CT chest negative for thymoma. Acetylcholine receptor antibody-127. Patient was initially intolerant of Mestinon and treated with IVIG but continued with symptoms therefore started on steriods. Mestinon has been resumed and dose being titrated by neurology. He required trach 2/11 as unable to tolerate extubation.  Currently tolerating CFS #6 at 28% ATC.  PEG placed by IVR on 02/13. He was started on coumadin for LLE DVT. MRI C-spine with slight DDD with moderate foraminal stenosis C3/4 and C4/5. Neck brace ordered to help with neck extension and support.      SIGNIFICANT EVENTS / STUDIES:  1/30 respiratory and cardiac arrest in ED  1/30 CT head: hypoattenuation R midbrain extending into L pons, poorly characterized due to streak artifact  1/30 Echocardiogram: LVEF normal. Mild to moderate RV dilatation  1/31 EGD gastritis and esophagitis but no mass or bleeding source  1/31 LE venous duplex  scans: LLE DVT - heparin started  1/31 Thick secretions -plugged ETT during MRI  1/31 MRI -Scattered subcortical T2 hyperintensities -nonspecific, chronic microvascular ischemia vs a demyelinating process  2/1 Neurology consult: concern for myasthenia gravis  2/1 CT chest - no thymoma or central PE, Bilateral lower lobe consolidation and atelectasis , Nonspecific enhancing solid 4.1 cm mass exophytic from segment 2 of the left hepatic lobe  2/2 - Mestinon started - intolerant due to excessive secretions  2/3 - IVIG initiated by Neuro  2/4 - Acetylcholine Receptor Ab 127.00 (<=0.30 mmol/L)  2/4 - MRI cervical spine >> normal  2/4 - Mestinon retried at a decreased dose  2-4 - tx to cone.  2-7 - Bronchoscopy  2/9 - extubated  2/10- re intubated  2/11 - trach  2/13 - peg  2/13 - Last TPE session  2/17 - Warfarin begun  2/19 - tolerated 18 hrs continuous ATC  2/20 - off vent 24 hrs/d  2/21 - remains off vent 24/7  2/26 - on ATC 28% via #6 cuffless trach  LINES / TUBES:  1/30 ETT >> 2/9  1/30 L [place] CVL >> 2/17  2/5 RtI J HD Cath insertion>> Out  2/11 trach >> 2/24 -6 cuffless   CULTURES:  1/30 blood >> NEG  1/30 urine >> NEG  2-2 sputum>> NEG  2-1 bc x 2 >> NEG  2-7 BAL >> oral flora  2-11 BAL >> NOF  2/15 C diff >> NEG   ANTIBIOTICS:  unasyn 1/30 >> 2/4  levaquin 1/30 >> 2/4  Hospital Summary by Discharge Diagnosis  Acute respiratory failure Aspiration PNA Tracheostomy status  Acute respiratory failure in setting of neuromuscular disorder / MG.  Initially intubated after cardiopulmonary arrest.  Unfortunately did not tolerate extubation and ultimately required percutaneous tracheostomy placement.  Treated for aspiration PNA as above.    Discharge Plan: -continue #6 cuffless trach -trach care QShift & PRN -PRN tracheal suction -f/u in trach clinic for potential downsize of trach to #4  Neuromuscular  disorder Myasthenia gravis Anxiety New diagnosis of MG. Father also has history of MG.  CT chest negative for thymoma. Acetylcholine receptor antibody-127. Patient was initially intolerant of Mestinon and treated with IVIG but continued with symptoms therefore started on steriods. Mestinon has been resumed and dose being titrated by neurology. MRI C-spine with slight DDD with moderate foraminal stenosis C3/4 and C4/5. Neck brace ordered to help with neck extension and support.    Discharge Plan: -continue prednisone 57 mg daily until follow up with Neurology -mestinon 90 mg 5 times daily -If secretions become a problem, would consider glycopyrrolate 1mg  TID as this has muscarinic activity while relatively sparing nicotinic receptors.  -Continue aggressive PT  -Imperative that he keeps his follow up appointment with Neurology.       Cardiac arrest In setting of respiratory arrest due to hypoxia.    Discharge Plan: -no further interventions  Dysphagia Protein Calorie Malnutrition  Discharge Plan: -ok for PMV all waking hours with supervision -once tolerating PMV better consider swallow evaluation per SLP -continue Jevity TF per PEG -protonix per tube -PEG site care QD    DVT LLE (deep venous thrombosis) Anemia of Critical Illness Thrombocytopenia  Discharge Plan: -continue coumadin for 3-6 months pending on ambulatory ability (if risk factor of immobility is gone can d/c in 3 months) -follow up PT/INR on 2/28 (friday).  PT INR MWF -coumadin 5mg  MWF, 7.5 mg on T, Th, S, S -CBC PRN  Liver mass Incidental finding on initial CT 2/1 >> Nonspecific enhancing solid 4.1 cm mass exophytic from segment 2 of the left hepatic lobe. Differential considerations include focal nodular hyperplasia, hepatic adenoma, and primary and metastatic neoplasms. Recommend further evaluation with non emergent (out patient) MRI.   Discharge Plan: -follow up outpatient MRI once patient more stable.   Pending MRI results, f/u Oncology if needed  Discharge Exam: Gen: NAD. In recliner chair. Passive  HEENT: Bilateral eyelid droop  PULM: no wheezing, no rhonchi anteriorly. Trach clean, no stridor  CV: regular, no murmur  AB: Soft, non tender  Ext: no edema  Neuro: diffusely weak. Eyelids closed. No effort to move extremities   Filed Vitals:   08/06/12 0400 08/06/12 0401 08/06/12 0745 08/06/12 0834  BP: 127/74  112/72   Pulse: 75  65   Temp:  97.7 F (36.5 C) 97.7 F (36.5 C)   TempSrc:  Oral Oral   Resp: 20  20   Height:      Weight:      SpO2: 95%  95% 94%     Discharge Labs  BMET  Recent Labs Lab 07/31/12 0600 08/01/12 0517  NA  --  139  K  --  4.2  CL  --  102  CO2  --  31  GLUCOSE  --  88  BUN  --  19  CREATININE 0.51 0.51  CALCIUM  --  9.6   CBC  Recent Labs Lab 08/04/12 0445 08/05/12 0604 08/06/12 0514  HGB 13.7 14.5 14.6  HCT 43.0 43.2 43.9  WBC 5.3 8.3 8.2  PLT 160 202 188   Anti-Coagulation  Recent Labs Lab 08/01/12 0517 08/02/12 0520 08/03/12 0528 08/04/12 0445 08/06/12 0514  INR 1.75* 2.00* 2.13* 2.24* 2.60*     Medication List    TAKE these medications       albuterol (5 MG/ML) 0.5% nebulizer solution  Commonly known as:  PROVENTIL  Take 0.5 mLs (2.5 mg total) by nebulization 3 (three) times daily.     ALPRAZolam 0.5 MG tablet  Commonly known as:  XANAX  Take 1 tablet (0.5 mg total) by mouth every 3 (three) hours as needed for anxiety.     antiseptic oral rinse Liqd  15 mLs by Mouth Rinse route QID.     aspirin 81 MG chewable tablet  Chew 81 mg by mouth every morning.     chlorhexidine 0.12 % solution  Commonly known as:  PERIDEX  Use as directed 15 mLs in the mouth or throat 2 (two) times daily.     feeding supplement (JEVITY 1.2 CAL) Liqd  Place 1,000 mLs into feeding tube continuous.     pantoprazole sodium 40 mg/20 mL Pack  Commonly known as:  PROTONIX  Place 20 mLs (40 mg total) into feeding tube daily.      predniSONE 5 MG tablet  Commonly known as:  DELTASONE  Take 7 tablets (35 mg total) by mouth daily with breakfast.     pyridostigmine 60 MG/5ML syrup  Commonly known as:  MESTINON  Place 7.5 mLs (90 mg total) into feeding tube 5 (five) times daily.     traZODone 100 MG tablet  Commonly known as:  DESYREL  Take 1 tablet (100 mg total) by mouth at bedtime.     warfarin 5 MG tablet  Commonly known as:  COUMADIN  Take 1 tablet (5 mg total) by mouth every Monday, Wednesday, and Friday at 6 PM.     warfarin 7.5 MG tablet  Commonly known as:  COUMADIN  Take 1 tablet (7.5 mg total) by mouth every Tuesday, Thursday, Saturday, and Sunday at 6 PM.        Disposition: Discharge to [facility].    Discharged Condition: Victor Durham has met maximum benefit of inpatient care and is medically stable and cleared for discharge.  Patient is pending follow up as above.      Time spent on disposition:  Greater than 35 minutes.   Signed: Canary Brim, NP-C [facility] Pgr: [phone]    Independently examined pt, evaluated data & formulated above discharge care plan with NP  [facility].

## 2012-08-06 NOTE — Progress Notes (Signed)
Passy-Muir Speaking Valve - Treatment Patient Details  Name: Victor Durham MRN: [id number] Date of Birth: Jul 23, 1955  Today's Date: 08/06/2012 Time: 1100-1130 SLP Time Calculation (min): 30 min  Past Medical History:  Past Medical History  Diagnosis Date  . DVT (deep venous thrombosis)   . Hypertension   . High cholesterol   . Myasthenia gravis    Past Surgical History:  Past Surgical History  Procedure Laterality Date  . Esophagogastroduodenoscopy  07/11/2012    Procedure: ESOPHAGOGASTRODUODENOSCOPY (EGD);  Surgeon: Shirley Friar, MD;  Location: [facility];  Service: Endoscopy;  Laterality: N/A;  BEDSIDE    Assessment / Plan / Recommendation Clinical Impression  Pt. seen for skilled treatement with now downsized trach to # 6 cuffless.  Pt. up in chair and alert with increasing strength and endurance and improved secretion management. PMSV donned for approximately 15-20 minutes without evidence of C02 retention when valve intermittently removed.  All vital signs were within normal range.  Pt. able to achieve phonation today in 1-2 word utterances with moderately decreased respiratory support and max verbal cues provided for deep inhalation and forceful exhalation to increase vocal intensity.  Recommend pt. wear valve during all waking hours with full supervision moving toward intermittent as tolerated.Pt. will benefit from continued ST to facilitate respiratory and phonatory coordination for increased mean length of utterance for increased intensity and objective swallow assessment.  Scheduled to transfer to SNF today    Plan  Continue with current plan of care    Follow Up Recommendations  Skilled Nursing facility    Pertinent Vitals/Pain none    SLP Goals Potential to Achieve Goals: Good SLP Goal #1: Pt will tolerate PMSV with intermittent supervision during all waking hours SLP Goal #1 - Progress: Progressing toward goal   PMSV Trial  PMSV was placed for:  total 15-20 min Able to redirect subglottic air through upper airway: Yes Able to Attain Phonation: Yes Voice Quality: Hoarse;Low vocal intensity Able to Expectorate Secretions: No Breath Support for Phonation: Moderately decreased Intelligibility: Intelligibility reduced Word: 50-74% accurate Phrase: 0-24% accurate Respirations During Trial: 17 SpO2 During Trial: 97 % Pulse During Trial: 70 Behavior: Alert;Cooperative;Responsive to questions   Tracheostomy Tube       Vent Dependency  FiO2 (%): 28 %    Cuff Deflation Trial  GO     Behavior: Alert;Cooperative;Responsive to questions   [name] M.Ed [employer] [phone] 08/06/2012

## 2012-08-07 ENCOUNTER — Emergency Department (HOSPITAL_COMMUNITY): Payer: Medicaid Other

## 2012-08-07 ENCOUNTER — Emergency Department (HOSPITAL_COMMUNITY)
Admission: EM | Admit: 2012-08-07 | Discharge: 2012-08-07 | Disposition: A | Discharge status: Skilled Nursing Facility | Payer: Medicaid Other | Attending: Emergency Medicine | Admitting: Emergency Medicine

## 2012-08-07 ENCOUNTER — Encounter (HOSPITAL_COMMUNITY): Payer: Self-pay | Admitting: Family Medicine

## 2012-08-07 DIAGNOSIS — Z789 Other specified health status: Secondary | ICD-10-CM | POA: Insufficient documentation

## 2012-08-07 DIAGNOSIS — Z593 Problems related to living in residential institution: Secondary | ICD-10-CM | POA: Insufficient documentation

## 2012-08-07 DIAGNOSIS — F411 Generalized anxiety disorder: Secondary | ICD-10-CM | POA: Insufficient documentation

## 2012-08-07 DIAGNOSIS — Z79899 Other long term (current) drug therapy: Secondary | ICD-10-CM | POA: Insufficient documentation

## 2012-08-07 DIAGNOSIS — Z86718 Personal history of other venous thrombosis and embolism: Secondary | ICD-10-CM | POA: Insufficient documentation

## 2012-08-07 DIAGNOSIS — Z8669 Personal history of other diseases of the nervous system and sense organs: Secondary | ICD-10-CM | POA: Insufficient documentation

## 2012-08-07 DIAGNOSIS — E7800 Pure hypercholesterolemia, unspecified: Secondary | ICD-10-CM | POA: Insufficient documentation

## 2012-08-07 DIAGNOSIS — I10 Essential (primary) hypertension: Secondary | ICD-10-CM | POA: Insufficient documentation

## 2012-08-07 DIAGNOSIS — Z7982 Long term (current) use of aspirin: Secondary | ICD-10-CM | POA: Insufficient documentation

## 2012-08-07 DIAGNOSIS — Z7901 Long term (current) use of anticoagulants: Secondary | ICD-10-CM | POA: Insufficient documentation

## 2012-08-07 DIAGNOSIS — J9509 Other tracheostomy complication: Secondary | ICD-10-CM | POA: Insufficient documentation

## 2012-08-07 HISTORY — DX: Anxiety disorder, unspecified: F41.9

## 2012-08-07 HISTORY — DX: Dysphagia, unspecified: R13.10

## 2012-08-07 LAB — PROTIME-INR: INR: 2.09 — ABNORMAL HIGH (ref 0.00–1.49)

## 2012-08-07 LAB — CBC
HCT: 44.5 % (ref 39.0–52.0)
Hemoglobin: 15 g/dL (ref 13.0–17.0)
RDW: 15.5 % (ref 11.5–15.5)
WBC: 10.6 10*3/uL — ABNORMAL HIGH (ref 4.0–10.5)

## 2012-08-07 LAB — POCT I-STAT, CHEM 8
Calcium, Ion: 1.16 mmol/L (ref 1.12–1.23)
Glucose, Bld: 94 mg/dL (ref 70–99)
HCT: 47 % (ref 39.0–52.0)
Hemoglobin: 16 g/dL (ref 13.0–17.0)
Potassium: 4.5 mEq/L (ref 3.5–5.1)
TCO2: 36 mmol/L (ref 0–100)

## 2012-08-07 MED ORDER — LORAZEPAM 2 MG/ML IJ SOLN
1.0000 mg | Freq: Once | INTRAMUSCULAR | Status: AC
  Administered 2012-08-07: 1 mg via INTRAVENOUS
  Filled 2012-08-07: qty 1

## 2012-08-07 NOTE — ED Provider Notes (Signed)
History     CSN: [id number]  Arrival date & time 08/07/12  0453   First MD Initiated Contact with Patient 08/07/12 [phone]      Chief Complaint  Patient presents with  . Shortness of Breath    (Consider location/radiation/quality/duration/timing/severity/associated sxs/prior treatment) HPI History provided by EMS and limited history per patient. Lives in a nursing home, recently discharged from the hospital with a new trach and PEG. He suffered from cardiac arrest, was resuscitated and diagnosed with myasthenia gravis. Tonight patient concerned about his trach, reportedly has required a lot of suctioning, per EMS having difficulty breathing after his trach fell out which was replaced around 4 AM - sent here for further evaluation. Suctioned in route. No reported bleeding. No fevers or chills. No nausea vomiting. Currently no shortness of breath. Has history of anxiety.  Past Medical History  Diagnosis Date  . DVT (deep venous thrombosis)   . Hypertension   . High cholesterol   . Myasthenia gravis   . Anxiety   . Dysphagia 06/30/12    Past Surgical History  Procedure Laterality Date  . Esophagogastroduodenoscopy  07/11/2012    Procedure: ESOPHAGOGASTRODUODENOSCOPY (EGD);  Surgeon: Shirley Friar, MD;  Location: [facility];  Service: Endoscopy;  Laterality: N/A;  BEDSIDE  . Tracheostomy N/A     History reviewed. No pertinent family history.  History  Substance Use Topics  . Smoking status: Never Smoker   . Smokeless tobacco: Not on file  . Alcohol Use: No      Review of Systems  Constitutional: Negative for fever and chills.  HENT: Negative for neck pain and neck stiffness.   Eyes: Negative for pain.  Respiratory: Positive for shortness of breath. Negative for cough and wheezing.   Cardiovascular: Negative for chest pain.  Gastrointestinal: Negative for abdominal pain.  Genitourinary: Negative for dysuria.  Musculoskeletal: Negative for back pain.  Skin: Negative  for rash.  Neurological: Negative for headaches.  All other systems reviewed and are negative.    Allergies  Review of patient's allergies indicates no known allergies.  Home Medications   Current Outpatient Rx  Name  Route  Sig  Dispense  Refill  . albuterol (PROVENTIL) (5 MG/ML) 0.5% nebulizer solution   Nebulization   Take 2.5 mg by nebulization every 8 (eight) hours. Scheduled dose, trach care         . ALPRAZolam (XANAX) 0.5 MG tablet   Tube   Give 0.5 mg by tube every 3 (three) hours as needed for anxiety.         Marland Kitchen aspirin 81 MG chewable tablet   Tube   Give 81 mg by tube daily.         . chlorhexidine (PERIDEX) 0.12 % solution   Mouth/Throat   Use as directed 15 mLs in the mouth or throat 6 (six) times daily. Swab mouth at 10am and 10pm Rinse mouth 0600, 1200, 1800, and 0001         . Nutritional Supplements (FIBERSOURCE HN) LIQD   Tube   Give 70 mL/hr by tube continuous.         . pantoprazole sodium (PROTONIX) 40 mg/20 mL PACK   Per Tube   Place 40 mg into feeding tube daily.         . predniSONE (DELTASONE) 5 MG tablet   Tube   Give 35 mg by tube daily. 7 tablets         . pyridostigmine (MESTINON) 60 MG/5ML syrup  Tube   Give 90 mg by tube 5 (five) times daily. 7.37ml         . traZODone (DESYREL) 100 MG tablet   Tube   Give 100 mg by tube at bedtime.         Marland Kitchen warfarin (COUMADIN) 7.5 MG tablet   Tube   Give 7.5 mg by tube every Tuesday, Thursday, Saturday, and Sunday at 6 PM.         . Water For Irrigation, Sterile (STERILE WATER FOR IRRIGATION)   Tube   Give 200 mLs by tube every 4 (four) hours.           BP 150/124  Pulse 102  Temp(Src) 97.9 F (36.6 C)  Resp 22  SpO2 96%  Physical Exam  Constitutional: He is oriented to person, place, and time. He appears well-developed and well-nourished.  HENT:  Head: Normocephalic and atraumatic.  Eyes: EOM are normal. Pupils are equal, round, and reactive to light.   Neck: Neck supple.  Trach in place and site appears well without any significant secretions  Cardiovascular: Normal rate, regular rhythm and intact distal pulses.   Pulmonary/Chest: Effort normal and breath sounds normal. No respiratory distress.  Abdominal: Bowel sounds are normal. He exhibits no distension. There is no tenderness.  Musculoskeletal: Normal range of motion. He exhibits no edema.  Neurological: He is alert and oriented to person, place, and time.  Skin: Skin is warm and dry.    ED Course  Procedures (including critical care time)  Labs Reviewed  CBC - Abnormal; Notable for the following:    WBC 10.6 (*)    All other components within normal limits  PROTIME-INR   Dg Chest Portable 1 View  08/07/2012  *RADIOLOGY REPORT*  Clinical Data: Shortness of breath.  Tracheostomy.  PORTABLE CHEST - 1 VIEW  Comparison: 08/03/2012  Findings: Patient positioning suggest kyphosis resulting in reverse lordotic appearance.  Tracheostomy tube is visualized but location cannot be determined based on technique.  The heart size and pulmonary vascularity look normal.  There is a residual linear infiltration or atelectasis in the lung bases bilaterally.  No blunting of costophrenic angles.  No pneumothorax.  IMPRESSION: Linear infiltration or atelectasis in the lung bases. Tracheostomy.  Positioning of the endotracheal tube is difficult to determine due to patient positioning.   Original Report Authenticated By: Burman Nieves, M.D.      Date: 08/07/2012  Rate: 106  Rhythm: sinus tachycardia  QRS Axis: normal  Intervals: normal  ST/T Wave abnormalities: nonspecific ST changes  Conduction Disutrbances:none  Narrative Interpretation:   Old EKG Reviewed: none available  Respiratory bedside, suctioning trach with minimal secretions present. No hypoxia or respiratory distress in the ER. No stridor, adequate ventilation.  Ativan provided for mild anxiety.  PTs father presents bedside, with  many questions of concern about who is physician is at the SNF and questions regarding his care there that I am unable to answer. We had a long discussion and at this time, PT is stable for transport back to SNF with continued trach care. He has adequate breath sounds, improved condition with suctioning.   MDM   Trach fell out and was replaced at the nursing home, evaluated in the ED - trach appears well.  Workup as above. No signs of myasthenia gravis on exam. Awake alert and equal strengths  Chest x-ray, EKG and labs reviewed as above. Vital signs within normal limits. Old records reviewed.  Sunnie Nielsen, MD 08/08/12 [phone]

## 2012-08-07 NOTE — ED Notes (Signed)
Per EMS pt from [facility]. Pt presents with SOB. Pt began having difficulty breathing after pt's trach "fell out" and was replaced by the pt at approx 0400. SNF gave breathing treatment and suctioned pt's tracheostomy with relief. Pt requested to be brought to [facility] to be checked. Per EMS pt had no distress in transport. Upon arrival pt developed respiratory distress with increased WOB. Dr. Dierdre Highman and respiratory at bedside.

## 2012-08-07 NOTE — ED Notes (Signed)
[facility] transporting patient back to facility. Respiratory suctioning pt. Vitals stable. EDP reporting to send pt back to facility, breathing has improved after suctioning. 94% with trach collar.

## 2012-08-07 NOTE — ED Notes (Signed)
Pt refused rectal temp at this time. EDP aware.

## 2012-08-07 NOTE — ED Notes (Signed)
[facility] at bedside, EDP at bedside to speak with family. Pt suctioned by RN.

## 2012-08-21 LAB — FUNGUS CULTURE W SMEAR

## 2012-08-29 ENCOUNTER — Other Ambulatory Visit: Payer: Self-pay | Admitting: *Deleted

## 2012-08-29 MED ORDER — ALPRAZOLAM 0.5 MG PO TBDP: ORAL_TABLET | ORAL | Status: DC

## 2012-08-31 ENCOUNTER — Emergency Department (HOSPITAL_COMMUNITY): Payer: Medicaid Other

## 2012-08-31 ENCOUNTER — Inpatient Hospital Stay (HOSPITAL_COMMUNITY)
Admission: EM | Admit: 2012-08-31 | Discharge: 2012-09-20 | DRG: 602 | Disposition: A | Discharge status: Skilled Nursing Facility | Payer: Medicaid Other | Attending: Family Medicine | Admitting: Family Medicine

## 2012-08-31 ENCOUNTER — Encounter (HOSPITAL_COMMUNITY): Payer: Self-pay | Admitting: Physical Medicine and Rehabilitation

## 2012-08-31 DIAGNOSIS — I82402 Acute embolism and thrombosis of unspecified deep veins of left lower extremity: Secondary | ICD-10-CM

## 2012-08-31 DIAGNOSIS — L03115 Cellulitis of right lower limb: Secondary | ICD-10-CM

## 2012-08-31 DIAGNOSIS — E8779 Other fluid overload: Secondary | ICD-10-CM | POA: Diagnosis not present

## 2012-08-31 DIAGNOSIS — L02212 Cutaneous abscess of back [any part, except buttock]: Secondary | ICD-10-CM

## 2012-08-31 DIAGNOSIS — Z794 Long term (current) use of insulin: Secondary | ICD-10-CM

## 2012-08-31 DIAGNOSIS — E872 Acidosis, unspecified: Secondary | ICD-10-CM | POA: Diagnosis not present

## 2012-08-31 DIAGNOSIS — I498 Other specified cardiac arrhythmias: Secondary | ICD-10-CM | POA: Diagnosis present

## 2012-08-31 DIAGNOSIS — Z7982 Long term (current) use of aspirin: Secondary | ICD-10-CM

## 2012-08-31 DIAGNOSIS — R0602 Shortness of breath: Secondary | ICD-10-CM

## 2012-08-31 DIAGNOSIS — L8991 Pressure ulcer of unspecified site, stage 1: Secondary | ICD-10-CM | POA: Diagnosis present

## 2012-08-31 DIAGNOSIS — S21109A Unspecified open wound of unspecified front wall of thorax without penetration into thoracic cavity, initial encounter: Secondary | ICD-10-CM | POA: Diagnosis present

## 2012-08-31 DIAGNOSIS — G7001 Myasthenia gravis with (acute) exacerbation: Secondary | ICD-10-CM | POA: Diagnosis not present

## 2012-08-31 DIAGNOSIS — G7000 Myasthenia gravis without (acute) exacerbation: Secondary | ICD-10-CM

## 2012-08-31 DIAGNOSIS — X58XXXA Exposure to other specified factors, initial encounter: Secondary | ICD-10-CM | POA: Diagnosis not present

## 2012-08-31 DIAGNOSIS — Z931 Gastrostomy status: Secondary | ICD-10-CM

## 2012-08-31 DIAGNOSIS — Z86718 Personal history of other venous thrombosis and embolism: Secondary | ICD-10-CM

## 2012-08-31 DIAGNOSIS — L02415 Cutaneous abscess of right lower limb: Secondary | ICD-10-CM

## 2012-08-31 DIAGNOSIS — L89109 Pressure ulcer of unspecified part of back, unspecified stage: Secondary | ICD-10-CM | POA: Diagnosis present

## 2012-08-31 DIAGNOSIS — I10 Essential (primary) hypertension: Secondary | ICD-10-CM | POA: Diagnosis present

## 2012-08-31 DIAGNOSIS — F411 Generalized anxiety disorder: Secondary | ICD-10-CM | POA: Diagnosis present

## 2012-08-31 DIAGNOSIS — Z79899 Other long term (current) drug therapy: Secondary | ICD-10-CM

## 2012-08-31 DIAGNOSIS — E876 Hypokalemia: Secondary | ICD-10-CM | POA: Diagnosis not present

## 2012-08-31 DIAGNOSIS — M4628 Osteomyelitis of vertebra, sacral and sacrococcygeal region: Secondary | ICD-10-CM

## 2012-08-31 DIAGNOSIS — R7881 Bacteremia: Secondary | ICD-10-CM | POA: Diagnosis present

## 2012-08-31 DIAGNOSIS — L02419 Cutaneous abscess of limb, unspecified: Principal | ICD-10-CM | POA: Diagnosis present

## 2012-08-31 DIAGNOSIS — J9600 Acute respiratory failure, unspecified whether with hypoxia or hypercapnia: Secondary | ICD-10-CM

## 2012-08-31 DIAGNOSIS — L03119 Cellulitis of unspecified part of limb: Principal | ICD-10-CM | POA: Diagnosis present

## 2012-08-31 DIAGNOSIS — Z7901 Long term (current) use of anticoagulants: Secondary | ICD-10-CM

## 2012-08-31 DIAGNOSIS — H02409 Unspecified ptosis of unspecified eyelid: Secondary | ICD-10-CM | POA: Diagnosis present

## 2012-08-31 DIAGNOSIS — L02219 Cutaneous abscess of trunk, unspecified: Secondary | ICD-10-CM | POA: Diagnosis present

## 2012-08-31 DIAGNOSIS — J9620 Acute and chronic respiratory failure, unspecified whether with hypoxia or hypercapnia: Secondary | ICD-10-CM | POA: Diagnosis not present

## 2012-08-31 DIAGNOSIS — R29810 Facial weakness: Secondary | ICD-10-CM | POA: Diagnosis not present

## 2012-08-31 DIAGNOSIS — Z930 Tracheostomy status: Secondary | ICD-10-CM

## 2012-08-31 DIAGNOSIS — I82409 Acute embolism and thrombosis of unspecified deep veins of unspecified lower extremity: Secondary | ICD-10-CM

## 2012-08-31 DIAGNOSIS — R1310 Dysphagia, unspecified: Secondary | ICD-10-CM

## 2012-08-31 DIAGNOSIS — E7800 Pure hypercholesterolemia, unspecified: Secondary | ICD-10-CM | POA: Diagnosis present

## 2012-08-31 DIAGNOSIS — IMO0002 Reserved for concepts with insufficient information to code with codable children: Secondary | ICD-10-CM

## 2012-08-31 DIAGNOSIS — A4902 Methicillin resistant Staphylococcus aureus infection, unspecified site: Secondary | ICD-10-CM | POA: Diagnosis present

## 2012-08-31 DIAGNOSIS — I959 Hypotension, unspecified: Secondary | ICD-10-CM | POA: Diagnosis not present

## 2012-08-31 DIAGNOSIS — G709 Myoneural disorder, unspecified: Secondary | ICD-10-CM

## 2012-08-31 LAB — PROTIME-INR
INR: 3.33 — ABNORMAL HIGH (ref 0.00–1.49)
Prothrombin Time: 31.9 seconds — ABNORMAL HIGH (ref 11.6–15.2)

## 2012-08-31 LAB — CBC WITH DIFFERENTIAL/PLATELET
Basophils Absolute: 0 10*3/uL (ref 0.0–0.1)
Basophils Relative: 0 % (ref 0–1)
Eosinophils Absolute: 0 10*3/uL (ref 0.0–0.7)
Hemoglobin: 15 g/dL (ref 13.0–17.0)
MCHC: 35.1 g/dL (ref 30.0–36.0)
Neutro Abs: 11.1 10*3/uL — ABNORMAL HIGH (ref 1.7–7.7)
Neutrophils Relative %: 88 % — ABNORMAL HIGH (ref 43–77)
Platelets: 216 10*3/uL (ref 150–400)
RDW: 15.8 % — ABNORMAL HIGH (ref 11.5–15.5)

## 2012-08-31 LAB — COMPREHENSIVE METABOLIC PANEL
BUN: 17 mg/dL (ref 6–23)
CO2: 27 mEq/L (ref 19–32)
Calcium: 9.1 mg/dL (ref 8.4–10.5)
Creatinine, Ser: 0.53 mg/dL (ref 0.50–1.35)
GFR calc Af Amer: >90 mL/min (ref >90)
GFR calc non Af Amer: >90 mL/min (ref >90)
Glucose, Bld: 186 mg/dL — ABNORMAL HIGH (ref 70–99)
Sodium: 137 mEq/L (ref 135–145)
Total Protein: 7.1 g/dL (ref 6.0–8.3)

## 2012-08-31 LAB — POCT I-STAT, CHEM 8
BUN: 24 mg/dL — ABNORMAL HIGH (ref 6–23)
Creatinine, Ser: 0.9 mg/dL (ref 0.50–1.35)
Hemoglobin: 15.6 g/dL (ref 13.0–17.0)
Potassium: 5.4 mEq/L — ABNORMAL HIGH (ref 3.5–5.1)
Sodium: 135 mEq/L (ref 135–145)
TCO2: 35 mmol/L (ref 0–100)

## 2012-08-31 LAB — POCT I-STAT TROPONIN I: Troponin i, poc: 0 ng/mL (ref 0.00–0.08)

## 2012-08-31 LAB — MAGNESIUM: Magnesium: 2.1 mg/dL (ref 1.5–2.5)

## 2012-08-31 LAB — PHOSPHORUS: Phosphorus: 5 mg/dL — ABNORMAL HIGH (ref 2.3–4.6)

## 2012-08-31 MED ORDER — PANTOPRAZOLE SODIUM 40 MG PO PACK
40.0000 mg | PACK | Freq: Every day | ORAL | Status: DC
  Administered 2012-08-31 – 2012-09-20 (×21): 40 mg
  Filled 2012-08-31 (×21): qty 20

## 2012-08-31 MED ORDER — ACETAMINOPHEN 650 MG RE SUPP: 650.0000 mg | Freq: Four times a day (QID) | RECTAL | Status: DC | PRN

## 2012-08-31 MED ORDER — SODIUM CHLORIDE 0.9 % IV SOLN
250.0000 mL | INTRAVENOUS | Status: DC | PRN
  Administered 2012-09-06 – 2012-09-16 (×3): 250 mL via INTRAVENOUS

## 2012-08-31 MED ORDER — VANCOMYCIN HCL IN DEXTROSE 1-5 GM/200ML-% IV SOLN
1000.0000 mg | Freq: Once | INTRAVENOUS | Status: AC
  Administered 2012-08-31: 1000 mg via INTRAVENOUS
  Filled 2012-08-31: qty 200

## 2012-08-31 MED ORDER — SODIUM CHLORIDE 0.9 % IJ SOLN: 3.0000 mL | Freq: Two times a day (BID) | INTRAMUSCULAR | Status: DC

## 2012-08-31 MED ORDER — HYDROCODONE-ACETAMINOPHEN 5-325 MG PO TABS: 1.0000 | ORAL_TABLET | ORAL | Status: DC | PRN

## 2012-08-31 MED ORDER — ASPIRIN 81 MG PO CHEW
81.0000 mg | CHEWABLE_TABLET | Freq: Every day | ORAL | Status: DC
  Administered 2012-08-31 – 2012-09-20 (×21): 81 mg
  Filled 2012-08-31 (×20): qty 1

## 2012-08-31 MED ORDER — PYRIDOSTIGMINE BROMIDE 60 MG/5ML PO SYRP
90.0000 mg | ORAL_SOLUTION | Freq: Every day | ORAL | Status: DC
  Administered 2012-09-01 – 2012-09-20 (×97): 90 mg
  Filled 2012-08-31 (×104): qty 7.5

## 2012-08-31 MED ORDER — SODIUM CHLORIDE 0.9 % IJ SOLN
3.0000 mL | Freq: Two times a day (BID) | INTRAMUSCULAR | Status: DC
  Administered 2012-08-31 – 2012-09-13 (×14): 3 mL via INTRAVENOUS
  Administered 2012-09-15: 10:00:00 via INTRAVENOUS
  Administered 2012-09-16: 3 mL via INTRAVENOUS

## 2012-08-31 MED ORDER — PIPERACILLIN-TAZOBACTAM 3.375 G IVPB
3.3750 g | Freq: Three times a day (TID) | INTRAVENOUS | Status: DC
  Administered 2012-08-31 – 2012-09-01 (×2): 3.375 g via INTRAVENOUS
  Filled 2012-08-31 (×5): qty 50

## 2012-08-31 MED ORDER — ALBUTEROL SULFATE (5 MG/ML) 0.5% IN NEBU
2.5000 mg | INHALATION_SOLUTION | Freq: Three times a day (TID) | RESPIRATORY_TRACT | Status: DC
  Administered 2012-09-01 – 2012-09-02 (×6): 2.5 mg via RESPIRATORY_TRACT
  Filled 2012-08-31 (×6): qty 0.5

## 2012-08-31 MED ORDER — PREDNISONE 5 MG PO TABS
35.0000 mg | ORAL_TABLET | Freq: Every day | ORAL | Status: DC
  Administered 2012-08-31 – 2012-09-01 (×2): 35 mg
  Filled 2012-08-31 (×4): qty 1

## 2012-08-31 MED ORDER — STERILE WATER FOR IRRIGATION IR SOLN
200.0000 mL | Status: DC
  Administered 2012-08-31 – 2012-09-20 (×107): 200 mL

## 2012-08-31 MED ORDER — TRAZODONE HCL 100 MG PO TABS
100.0000 mg | ORAL_TABLET | Freq: Every day | ORAL | Status: DC
  Administered 2012-08-31 – 2012-09-01 (×2): 100 mg
  Filled 2012-08-31 (×3): qty 1

## 2012-08-31 MED ORDER — JEVITY 1.2 CAL PO LIQD
1000.0000 mL | ORAL | Status: DC
  Administered 2012-08-31 – 2012-09-01 (×2): 1000 mL
  Administered 2012-09-02: 17:00:00
  Administered 2012-09-02: 1000 mL
  Filled 2012-08-31 (×6): qty 1000

## 2012-08-31 MED ORDER — SODIUM CHLORIDE 0.9 % IJ SOLN: 3.0000 mL | INTRAMUSCULAR | Status: DC | PRN

## 2012-08-31 MED ORDER — ONDANSETRON HCL 4 MG/2ML IJ SOLN
4.0000 mg | Freq: Four times a day (QID) | INTRAMUSCULAR | Status: DC | PRN
  Filled 2012-08-31: qty 2

## 2012-08-31 MED ORDER — ONDANSETRON HCL 4 MG PO TABS: 4.0000 mg | ORAL_TABLET | Freq: Four times a day (QID) | ORAL | Status: DC | PRN

## 2012-08-31 MED ORDER — ALPRAZOLAM 0.5 MG PO TABS
0.5000 mg | ORAL_TABLET | ORAL | Status: DC | PRN
  Administered 2012-08-31 – 2012-09-02 (×4): 0.5 mg
  Filled 2012-08-31 (×5): qty 1

## 2012-08-31 MED ORDER — HEPARIN SODIUM (PORCINE) 5000 UNIT/ML IJ SOLN
5000.0000 [IU] | Freq: Three times a day (TID) | INTRAMUSCULAR | Status: DC
  Administered 2012-08-31 – 2012-09-01 (×2): 5000 [IU] via SUBCUTANEOUS
  Filled 2012-08-31 (×5): qty 1

## 2012-08-31 MED ORDER — ACETAMINOPHEN 325 MG PO TABS: 650.0000 mg | ORAL_TABLET | Freq: Four times a day (QID) | ORAL | Status: DC | PRN

## 2012-08-31 MED ORDER — VANCOMYCIN HCL IN DEXTROSE 1-5 GM/200ML-% IV SOLN
1000.0000 mg | Freq: Two times a day (BID) | INTRAVENOUS | Status: DC
  Administered 2012-08-31 – 2012-09-02 (×4): 1000 mg via INTRAVENOUS
  Filled 2012-08-31 (×5): qty 200

## 2012-08-31 MED ORDER — FIBERSOURCE HN PO LIQD: 70.0000 mL/h | ORAL | Status: DC

## 2012-08-31 NOTE — ED Notes (Signed)
Pt suctioned and re-positioned in the bed. Pt updated on plan of admission. He is resting quietly at the time. Remains alert and oriented x4. Communicating by writing at the time.

## 2012-08-31 NOTE — ED Notes (Signed)
Konstantine (father) [phone], call with bed number.

## 2012-08-31 NOTE — ED Notes (Signed)
Dr. Hyacinth Meeker at the bedside to perform I&D, pt tolerating without difficulty. Wound culture obtained.

## 2012-08-31 NOTE — H&P (Signed)
H&P Note Family Medicine Teaching Service Victor Durham M. Victor Forrer, MD Service Pager: [phone]  Victor Durham is an 57 y.o. male.   Chief Complaint: SOB and leg pain HPI: Pt is a 58 yo M with PMH of myasthenia gravis (recently requiring extended hospitalization with PEG and trach), HTN, DVT and HLD who presented to the ED today from SNF for complaint of "shortness of breath." On arrival to the ED, his O2 saturations were >90% on trach collar but he did have increased work of breathing. On EDP assessment of the patient, he was found to have RLE redness, swelling, pain and superficial abscess. Bedside I&D was performed but given the extend of his cellulitis and recent hospitalization, FMTS was called for admission for IV antibiotics. Patient communicates through writing, and at time of admission he denied any discomfort or new symptoms other than a headache. He does state that he gets "uptight" when he is in the hospital.   Past Medical History  Diagnosis Date  . DVT (deep venous thrombosis)   . Hypertension   . High cholesterol   . Myasthenia gravis   . Anxiety   . Dysphagia 06/30/12    Past Surgical History  Procedure Laterality Date  . Esophagogastroduodenoscopy  07/11/2012    Procedure: ESOPHAGOGASTRODUODENOSCOPY (EGD);  Surgeon: Shirley Friar, MD;  Location: [facility];  Service: Endoscopy;  Laterality: N/A;  BEDSIDE  . Tracheostomy N/A     History reviewed. No pertinent family history. Social History:  reports that he has never smoked. He does not have any smokeless tobacco history on file. He reports that he does not drink alcohol or use illicit drugs.  Allergies: No Known Allergies  Medications Prior to Admission  Medication Sig Dispense Refill  . albuterol (PROVENTIL) (5 MG/ML) 0.5% nebulizer solution Take 2.5 mg by nebulization every 8 (eight) hours. Scheduled dose, trach care      . ALPRAZolam (XANAX) 0.5 MG tablet Give 0.5 mg by tube every 3 (three) hours as needed  for anxiety.      Marland Kitchen aspirin 81 MG chewable tablet Give 81 mg by tube daily.      . chlorhexidine (PERIDEX) 0.12 % solution Use as directed 15 mLs in the mouth or throat 6 (six) times daily. Swab mouth at 10am and 10pm Rinse mouth 0600, 1200, 1800, and 0001      . Nutritional Supplements (FIBERSOURCE HN) LIQD Give 70 mL/hr by tube continuous.      . pantoprazole sodium (PROTONIX) 40 mg/20 mL PACK Place 40 mg into feeding tube daily.      . predniSONE (DELTASONE) 5 MG tablet Give 35 mg by tube daily. 7 tablets      . pyridostigmine (MESTINON) 60 MG/5ML syrup Give 90 mg by tube 5 (five) times daily. 7.25ml      . traZODone (DESYREL) 100 MG tablet Give 100 mg by tube at bedtime.      Marland Kitchen warfarin (COUMADIN) 5 MG tablet Take 5 mg by mouth every Monday, Wednesday, and Friday.      . warfarin (COUMADIN) 7.5 MG tablet Give 7.5 mg by tube every Tuesday, Thursday, Saturday, and Sunday at 6 PM.      . Water For Irrigation, Sterile (STERILE WATER FOR IRRIGATION) Give 200 mLs by tube every 4 (four) hours.        Results for orders placed during the hospital encounter of 08/31/12 (from the past 48 hour(s))  CBC WITH DIFFERENTIAL     Status: Abnormal   Collection Time  08/31/12 11:13 AM      Result Value Range   WBC 12.6 (*) 4.0 - 10.5 K/uL   RBC 4.78  4.22 - 5.81 MIL/uL   Hemoglobin 15.0  13.0 - 17.0 g/dL   HCT 16.1  09.6 - 04.5 %   MCV 89.3  78.0 - 100.0 fL   MCH 31.4  26.0 - 34.0 pg   MCHC 35.1  30.0 - 36.0 g/dL   RDW 40.9 (*) 81.1 - 91.4 %   Platelets 216  150 - 400 K/uL   Neutrophils Relative 88 (*) 43 - 77 %   Neutro Abs 11.1 (*) 1.7 - 7.7 K/uL   Lymphocytes Relative 6 (*) 12 - 46 %   Lymphs Abs 0.7  0.7 - 4.0 K/uL   Monocytes Relative 6  3 - 12 %   Monocytes Absolute 0.8  0.1 - 1.0 K/uL   Eosinophils Relative 0  0 - 5 %   Eosinophils Absolute 0.0  0.0 - 0.7 K/uL   Basophils Relative 0  0 - 1 %   Basophils Absolute 0.0  0.0 - 0.1 K/uL  APTT     Status: Abnormal   Collection Time     08/31/12 11:13 AM      Result Value Range   aPTT 55 (*) 24 - 37 seconds   Comment:            IF BASELINE aPTT IS ELEVATED,     SUGGEST PATIENT RISK ASSESSMENT     BE USED TO DETERMINE APPROPRIATE     ANTICOAGULANT THERAPY.  PROTIME-INR     Status: Abnormal   Collection Time    08/31/12 11:13 AM      Result Value Range   Prothrombin Time 31.9 (*) 11.6 - 15.2 seconds   INR 3.33 (*) 0.00 - 1.49  POCT I-STAT TROPONIN I     Status: None   Collection Time    08/31/12 12:50 PM      Result Value Range   Troponin i, poc 0.00  0.00 - 0.08 ng/mL   Comment 3            Comment: Due to the release kinetics of cTnI,     a negative result within the first hours     of the onset of symptoms does not rule out     myocardial infarction with certainty.     If myocardial infarction is still suspected,     repeat the test at appropriate intervals.  POCT I-STAT, CHEM 8     Status: Abnormal   Collection Time    08/31/12 12:52 PM      Result Value Range   Sodium 135  135 - 145 mEq/L   Potassium 5.4 (*) 3.5 - 5.1 mEq/L   Chloride 103  96 - 112 mEq/L   BUN 24 (*) 6 - 23 mg/dL   Creatinine, Ser 7.82  0.50 - 1.35 mg/dL   Glucose, Bld 956 (*) 70 - 99 mg/dL   Calcium, Ion 2.13 (*) 1.12 - 1.23 mmol/L   TCO2 35  0 - 100 mmol/L   Hemoglobin 15.6  13.0 - 17.0 g/dL   HCT 08.6  57.8 - 46.9 %   Dg Chest Port 1 View  08/31/2012  *RADIOLOGY REPORT*  Clinical Data: Chest pain  PORTABLE CHEST - 1 VIEW  Comparison: 08/07/2012  Findings: Tracheostomy device stable position.  Patchy subsegmental atelectasis or infiltrates in both lung bases, slightly improved since previous exam.  No  effusion.  Heart size normal.  Regional bones unremarkable.  IMPRESSION:  Slight improvement in bibasilar atelectasis or infiltrates.   Original Report Authenticated By: D. Andria Rhein, MD     ROS Positive for headache, otherwise ROS negative  Blood pressure 117/71, pulse 99, temperature 100.6 F (38.1 C), temperature source Oral,  resp. rate 22, SpO2 98.00%. Physical Exam  Gen: Sitting up in bed, contracted. Trach collar in place. Awake and responds appropriately HEENT: AT, [place]. Dry lips Neck: Trach site clear Cardio: Tachycardic, hard to appreciate any murmur Pulm: Good effort. Faint rales, slightly increased WOB but no respiratory distress. Abd: PEG site with dressing C/D/I. Abd soft, nontender Ext: All extremities contracted, but able to move. LLE with some chronic skin changes, nontender. RLE swollen and red to the knee. Dressing intact right posterior calf with serosanguinous fluid on bandage. Patient reports no pain. 2+ distal pulses Neuro: Does not communicate verbally due to trach, but writing intact and responses are appropriate.   Assessment/Plan 57 yo M with RLE cellulitis  # Cellulitis with abscess - I&D in the ED. Given one dose of vancomycin. Low grade fever and leukocytosis to 12.6. Does not appear septic at admission - Admit to stepdown (due to comorbidities), attending Dr. Denny Levy - Continue Vanc and add Zosyn since pt was in the hospital for 4 weeks and has been in a SNF since discharge - Dressing changes for I&D site - Monitor vitals per floor protocol - MRI ordered to assess the extend of abscess. Patient was not able to tolerate MRI due to increased anxiety/WOB. Will await for him to calm down before attempting to lie flat. If unable to do MRI, will get CT scan.  # Myasthenia Gravis - Patient recently diagnosed, but well controlled - Continue prednisone and pyridostigmine as prescribed as an outpatient - No neurology consult at this time  # Chronic Trach -  - Trach care with frequent suctioning - Continue trach collar - Monitor O2 sats - Give medications per tube - Oral care per protocol - Albuterol prn  # Chronic Peg- - Nutrition consult for feeds - PEG care  # H/o DVT on coumadin- INR supertherapeutic at admission  - No calf pain at this time or signs of new DVT - Warfarin per  pharmacy  # Hyperkalemia - - Repeat afternoon labs and AM labs - Give kayexalate if goes higher than 6  # Tachycardia- - Monitor on telemetry. Could be secondary to increased work of breathing - Consider beta blocker if hypertensive  # SNF resident - PT/OT - CSW for placement. Came from [facility]  FEN/GI - Per nutrition. Ppx- Heparin Sq Dispo- Pending improvement and transition to PO antibiotics.  Code: FULL Contact: Victor Durham, father: [phone]  Victor Durham 08/31/2012, 5:20 PM   Addendum: Called by nurse on unit to report abscess of patient's right upper back and stage 2 decubitus ulcers. On exam, patient has a well-defined 2x3cm area of fluctuance with some surrounding erythema. Pt denies tenderness. There is a healed scab over the area with some serosanguineous drainage. After discussion with the patient, it would rather not do I&D tonight. Will treat with IV antibiotics as listed above and consider I&D tomorrow if not improved. Also, will get wound care consult for stage 2 ulcers.  Victor Durham, M.D. 08/31/2012 6:42 PM

## 2012-08-31 NOTE — ED Notes (Signed)
Attempted to call report. Nurse unable to take at the time. To call back.  

## 2012-08-31 NOTE — ED Provider Notes (Signed)
History     CSN: [id number]  Arrival date & time 08/31/12  1036   First MD Initiated Contact with Patient 08/31/12 1052      Chief Complaint  Patient presents with  . Shortness of Breath    (Consider location/radiation/quality/duration/timing/severity/associated sxs/prior treatment) HPI Comments: 52 with a history of recent diagnosis of neurologic abnormalities including myasthenia gravis who required tracheostomy as well as a PEG tube for feeding because of his severe illness. He stayed in the intensive. For some time and had aspiration pneumonia associated with this stay. He is currently residing in a nursing facility, the nurses at the facility this morning noticed that he was short of breath, and increased respiratory effort and secretions from his tracheostomy, provided suctioning and called for EMS. The patient complains only of pain to his right calf but is not complaining of chest pain or abdominal pain. He agrees he feel short of breath by nodding, he is unable to verbalize at this time.  Patient is a 57 y.o. male presenting with shortness of breath. The history is provided by the patient.  Shortness of Breath   Past Medical History  Diagnosis Date  . DVT (deep venous thrombosis)   . Hypertension   . High cholesterol   . Myasthenia gravis   . Anxiety   . Dysphagia 06/30/12    Past Surgical History  Procedure Laterality Date  . Esophagogastroduodenoscopy  07/11/2012    Procedure: ESOPHAGOGASTRODUODENOSCOPY (EGD);  Surgeon: Shirley Friar, MD;  Location: [facility];  Service: Endoscopy;  Laterality: N/A;  BEDSIDE  . Tracheostomy N/A     History reviewed. No pertinent family history.  History  Substance Use Topics  . Smoking status: Never Smoker   . Smokeless tobacco: Not on file  . Alcohol Use: No      Review of Systems  Respiratory: Positive for shortness of breath.   All other systems reviewed and are negative.    Allergies  Review of patient's  allergies indicates no known allergies.  Home Medications   Current Outpatient Rx  Name  Route  Sig  Dispense  Refill  . albuterol (PROVENTIL) (5 MG/ML) 0.5% nebulizer solution   Nebulization   Take 2.5 mg by nebulization every 8 (eight) hours. Scheduled dose, trach care         . ALPRAZolam (XANAX) 0.5 MG tablet   Tube   Give 0.5 mg by tube every 3 (three) hours as needed for anxiety.         Marland Kitchen aspirin 81 MG chewable tablet   Tube   Give 81 mg by tube daily.         . chlorhexidine (PERIDEX) 0.12 % solution   Mouth/Throat   Use as directed 15 mLs in the mouth or throat 6 (six) times daily. Swab mouth at 10am and 10pm Rinse mouth 0600, 1200, 1800, and 0001         . Nutritional Supplements (FIBERSOURCE HN) LIQD   Tube   Give 70 mL/hr by tube continuous.         . pantoprazole sodium (PROTONIX) 40 mg/20 mL PACK   Per Tube   Place 40 mg into feeding tube daily.         . predniSONE (DELTASONE) 5 MG tablet   Tube   Give 35 mg by tube daily. 7 tablets         . pyridostigmine (MESTINON) 60 MG/5ML syrup   Tube   Give 90 mg by tube 5 (five)  times daily. 7.25ml         . traZODone (DESYREL) 100 MG tablet   Tube   Give 100 mg by tube at bedtime.         Marland Kitchen warfarin (COUMADIN) 5 MG tablet   Oral   Take 5 mg by mouth every Monday, Wednesday, and Friday.         . warfarin (COUMADIN) 7.5 MG tablet   Tube   Give 7.5 mg by tube every Tuesday, Thursday, Saturday, and Sunday at 6 PM.         . Water For Irrigation, Sterile (STERILE WATER FOR IRRIGATION)   Tube   Give 200 mLs by tube every 4 (four) hours.           BP 119/75  Pulse 128  Temp(Src) 100.6 F (38.1 C) (Oral)  Resp 18  SpO2 91%  Physical Exam  Nursing note and vitals reviewed. Constitutional: He appears well-developed and well-nourished. He appears distressed.  HENT:  Head: Normocephalic and atraumatic.  Mouth/Throat: Oropharynx is clear and moist. No oropharyngeal exudate.   Eyes: Conjunctivae and EOM are normal. Pupils are equal, round, and reactive to light. Right eye exhibits no discharge. Left eye exhibits no discharge. No scleral icterus.  Neck: Normal range of motion. Neck supple. No JVD present. No thyromegaly present.  Trach site clean, no bleeding, no sig d/c or bleeding  Cardiovascular: Regular rhythm, normal heart sounds and intact distal pulses.  Exam reveals no gallop and no friction rub.   No murmur heard. Tachycardia present, strong peripheral pulses. Pulses at the dorsalis pedis bilaterally palpable  Pulmonary/Chest: He is in respiratory distress.  Slight increased work of breathing with tachypnea to 25, decreased breath sounds in all lung fields  Abdominal: Soft. Bowel sounds are normal. He exhibits no distension and no mass. There is no tenderness.  PEG tube site clean, no redness swelling erythema or drainage  Musculoskeletal: Normal range of motion. He exhibits no edema and no tenderness.  Lymphadenopathy:    He has no cervical adenopathy.  Neurological: He is alert. Coordination normal.  The patient is able to grip with both hands, able to help sit up in the bed  Skin: Skin is warm and dry. No rash noted. No erythema.  Significant erythema with a central area of abscess formation of the posterior calf on the right, erythema extends anteriorly two thirds the circumference of the calf  Psychiatric: He has a normal mood and affect. His behavior is normal.    ED Course  Procedures (including critical care time)  Labs Reviewed  CBC WITH DIFFERENTIAL - Abnormal; Notable for the following:    WBC 12.6 (*)    RDW 15.8 (*)    Neutrophils Relative 88 (*)    Neutro Abs 11.1 (*)    Lymphocytes Relative 6 (*)    All other components within normal limits  APTT - Abnormal; Notable for the following:    aPTT 55 (*)    All other components within normal limits  PROTIME-INR - Abnormal; Notable for the following:    Prothrombin Time 31.9 (*)     INR 3.33 (*)    All other components within normal limits  POCT I-STAT, CHEM 8 - Abnormal; Notable for the following:    Potassium 5.4 (*)    BUN 24 (*)    Glucose, Bld 125 (*)    Calcium, Ion 1.08 (*)    All other components within normal limits  CULTURE, BLOOD (ROUTINE X  2)  CULTURE, BLOOD (ROUTINE X 2)  CULTURE, ROUTINE-ABSCESS  POCT I-STAT TROPONIN I   Dg Chest Port 1 View  08/31/2012  *RADIOLOGY REPORT*  Clinical Data: Chest pain  PORTABLE CHEST - 1 VIEW  Comparison: 08/07/2012  Findings: Tracheostomy device stable position.  Patchy subsegmental atelectasis or infiltrates in both lung bases, slightly improved since previous exam.  No effusion.  Heart size normal.  Regional bones unremarkable.  IMPRESSION:  Slight improvement in bibasilar atelectasis or infiltrates.   Original Report Authenticated By: D. Andria Rhein, MD      1. Cellulitis of right lower extremity   2. Shortness of breath   3. Cutaneous abscess of right lower extremity       MDM  The patient does appear critically ill with a fever, tachycardia, evidence of a cellulitis and increased respiratory distress. He is on Coumadin secondary to a DVT in his left lower extremity per the medical record, check labs, chest x-ray, incision and drainage, antibiotics and anticipate admission. His tracheostomy appears patent and functional, respiratory has suctioned and finds no major secretions.  ED ECG REPORT  I personally interpreted this EKG   Date: 08/31/2012   Rate: 139  Rhythm: sinus tachycardia  QRS Axis: left  Intervals: normal  ST/T Wave abnormalities: nonspecific T wave changes  Conduction Disutrbances:none  Narrative Interpretation:   Old EKG Reviewed: Compared with 08/07/2012, rate is faster, no other significant changes  Blood pressure has remained stable, heart rate has improved to 120, incision and drainage performed by myself, antibiotic started and admitted to the family practice service   INCISION AND  DRAINAGE Performed by: Vida Roller Consent: Verbal consent obtained. Risks and benefits: risks, benefits and alternatives were discussed Type: abscess  Body area: right lower extremity  Anesthesia: local infiltration  Incision was made with a scalpel.  Local anesthetic: lidocaine 1 % with epinephrine  Anesthetic total: 4 ml  Complexity: complex Blunt dissection to break up loculations  Drainage: purulent  Drainage amount: Moderate   Packing material: 1/4 in iodoform gauze  Patient tolerance: Patient tolerated the procedure well with no immediate complications.     Vida Roller, MD 08/31/12 1330

## 2012-08-31 NOTE — ED Notes (Signed)
3W refusing to take patient, admitting physician paged to change bed request.

## 2012-08-31 NOTE — H&P (Signed)
[facility]MTS Attending Admission Note: Leory Allinson MD [phone] pager office [phone] I  have seen and examined this patient, reviewed their chart. I have discussed this patient with the resident. I agree with the resident's findings, assessment and care plan. 

## 2012-08-31 NOTE — Progress Notes (Signed)
ANTIBIOTIC CONSULT NOTE - INITIAL  Pharmacy Consult for Vancomycin/Zosyn Indication: RLE cellulitis  No Known Allergies  Patient Measurements: Height: 6\' 2"  (188 cm) Weight: 156 lb 1.4 oz (70.8 kg) IBW/kg (Calculated) : 82.2  Vital Signs: Temp: 100.6 F (38.1 C) (03/23 1050) Temp src: Oral (03/23 1050) BP: 117/71 mmHg (03/23 1547) Pulse Rate: 99 (03/23 1547) Intake/Output from previous day:    Intake/Output from this shift:    Labs:  Recent Labs  08/31/12 1113 08/31/12 1252  WBC 12.6*  --   HGB 15.0 15.6  PLT 216  --   CREATININE  --  0.90   The CrCl is unknown because both a height and weight (above a minimum accepted value) are required for this calculation. No results found for this basename: VANCOTROUGH, VANCOPEAK, VANCORANDOM, GENTTROUGH, GENTPEAK, GENTRANDOM, TOBRATROUGH, TOBRAPEAK, TOBRARND, AMIKACINPEAK, AMIKACINTROU, AMIKACIN,  in the last 72 hours   Microbiology: No results found for this or any previous visit (from the past 720 hour(s)).  Medical History: Past Medical History  Diagnosis Date  . DVT (deep venous thrombosis)   . Hypertension   . High cholesterol   . Myasthenia gravis   . Anxiety   . Dysphagia 06/30/12    Medications:  Prescriptions prior to admission  Medication Sig Dispense Refill  . albuterol (PROVENTIL) (5 MG/ML) 0.5% nebulizer solution Take 2.5 mg by nebulization every 8 (eight) hours. Scheduled dose, trach care      . ALPRAZolam (XANAX) 0.5 MG tablet Give 0.5 mg by tube every 3 (three) hours as needed for anxiety.      Marland Kitchen aspirin 81 MG chewable tablet Give 81 mg by tube daily.      . chlorhexidine (PERIDEX) 0.12 % solution Use as directed 15 mLs in the mouth or throat 6 (six) times daily. Swab mouth at 10am and 10pm Rinse mouth 0600, 1200, 1800, and 0001      . Nutritional Supplements (FIBERSOURCE HN) LIQD Give 70 mL/hr by tube continuous.      . pantoprazole sodium (PROTONIX) 40 mg/20 mL PACK Place 40 mg into feeding tube  daily.      . predniSONE (DELTASONE) 5 MG tablet Give 35 mg by tube daily. 7 tablets      . pyridostigmine (MESTINON) 60 MG/5ML syrup Give 90 mg by tube 5 (five) times daily. 7.18ml      . traZODone (DESYREL) 100 MG tablet Give 100 mg by tube at bedtime.      Marland Kitchen warfarin (COUMADIN) 5 MG tablet Take 5 mg by mouth every Monday, Wednesday, and Friday.      . warfarin (COUMADIN) 7.5 MG tablet Give 7.5 mg by tube every Tuesday, Thursday, Saturday, and Sunday at 6 PM.      . Water For Irrigation, Sterile (STERILE WATER FOR IRRIGATION) Give 200 mLs by tube every 4 (four) hours.       Assessment: Victor Durham is a NH patient admitted with shortness of breath and RLE cellulitis. Noted patient is febrile, WBC is elevated at 12.6, but baseline Scr is nml. Noted hx of Myasthenia Gravis, which is likely the reason for low Scr, and therefore [name] is not a good indicator of renal function. Noted Vancomycin 1gm Iv was given at ~1230 today. Pt is s/p I & D.  3/23: MRSA pcr: 3/23: Abscess cx: 3/23: Blood: 3/23: Wound cx:  Goal of Therapy:  Vancomycin trough level 10-15 mcg/ml  Plan:  - Zosyn 3.375gm IV Q8h, each dose infused over 4 hours, start now -  Will give Vancomycin 1 gm IV q 12h, next dose at 2300 tonight - Will monitor cx/spec/sens, renal fn and clinical status daily. - Will monitor UOP to assess true renal function   ANTICOAGULATION CONSULT NOTE - Initial Consult  Pharmacy Consult for Coumadin Indication: DVT  No Known Allergies  Vital Signs: Temp: 100.6 F (38.1 C) (03/23 1050) Temp src: Oral (03/23 1050) BP: 117/71 mmHg (03/23 1547) Pulse Rate: 99 (03/23 1547)  Labs:  Recent Labs  08/31/12 1113 08/31/12 1252  HGB 15.0 15.6  HCT 42.7 46.0  PLT 216  --   APTT 55*  --   LABPROT 31.9*  --   INR 3.33*  --   CREATININE  --  0.90   Assessment: Patient was on Coumadin PTA for DVT diagnosed 07/11/12; noted NH regimen is 7.5mg  daily except on MWF, receives 5mg . Noted last dose  of Coumadin was given 3/22. INR is currently supratherapeutic.  Goal of Therapy:  INR 2-3 Monitor platelets by anticoagulation protocol: Yes   Plan:  - No Coumadin today - Will check daily PT/INR for now  Thanks, Victor Durham, PharmD, BCPS.  Clinical Pharmacist Pager [phone]. 08/31/2012 5:35 PM

## 2012-08-31 NOTE — ED Notes (Signed)
Pt presents to department via [facility] from [facility] for evaluation of SOB and increased secretions from tracheostomy. Expiratory wheezing noted all lung fields. Respirations labored upon arrival. Open wound and redness noted to back of R lower leg. Pt is conscious alert and oriented x4.

## 2012-09-01 ENCOUNTER — Inpatient Hospital Stay (HOSPITAL_COMMUNITY): Payer: Medicaid Other

## 2012-09-01 LAB — COMPREHENSIVE METABOLIC PANEL
BUN: 15 mg/dL (ref 6–23)
Creatinine, Ser: 0.46 mg/dL — ABNORMAL LOW (ref 0.50–1.35)
GFR calc non Af Amer: >90 mL/min (ref >90)
Potassium: 4.4 mEq/L (ref 3.5–5.1)
Sodium: 137 mEq/L (ref 135–145)
Total Bilirubin: 0.8 mg/dL (ref 0.3–1.2)

## 2012-09-01 LAB — CBC
MCH: 30 pg (ref 26.0–34.0)
Platelets: 151 10*3/uL (ref 150–400)
RBC: 4.7 MIL/uL (ref 4.22–5.81)
RDW: 15.7 % — ABNORMAL HIGH (ref 11.5–15.5)
WBC: 12.5 10*3/uL — ABNORMAL HIGH (ref 4.0–10.5)

## 2012-09-01 LAB — PROTIME-INR: Prothrombin Time: 31.5 seconds — ABNORMAL HIGH (ref 11.6–15.2)

## 2012-09-01 MED ORDER — LORAZEPAM 2 MG/ML IJ SOLN: 1.0000 mg | Freq: Once | INTRAMUSCULAR | Status: DC

## 2012-09-01 MED ORDER — PRO-STAT SUGAR FREE PO LIQD
30.0000 mL | Freq: Two times a day (BID) | ORAL | Status: DC
  Administered 2012-09-01 – 2012-09-02 (×3): 30 mL
  Filled 2012-09-01 (×4): qty 30

## 2012-09-01 MED ORDER — WARFARIN - PHARMACIST DOSING INPATIENT: Freq: Every day | Status: DC

## 2012-09-01 MED ORDER — CEFAZOLIN SODIUM-DEXTROSE 2-3 GM-% IV SOLR
2.0000 g | Freq: Three times a day (TID) | INTRAVENOUS | Status: DC
  Administered 2012-09-01 – 2012-09-02 (×3): 2 g via INTRAVENOUS
  Filled 2012-09-01 (×7): qty 50

## 2012-09-01 MED ORDER — CEFAZOLIN SODIUM 1-5 GM-% IV SOLN
1.0000 g | Freq: Four times a day (QID) | INTRAVENOUS | Status: DC
  Filled 2012-09-01 (×2): qty 50

## 2012-09-01 MED ORDER — PREDNISONE 50 MG PO TABS
70.0000 mg | ORAL_TABLET | Freq: Every day | ORAL | Status: DC
  Administered 2012-09-02 – 2012-09-10 (×8): 70 mg
  Filled 2012-09-01 (×11): qty 1

## 2012-09-01 NOTE — Progress Notes (Signed)
Utilization review completed.  

## 2012-09-01 NOTE — Evaluation (Addendum)
Physical Therapy Evaluation Patient Details Name: Victor Durham MRN: [id number] DOB: 1955-11-23 Today's Date: 09/01/2012 Time: 7829-5621 PT Time Calculation (min): 29 min  PT Assessment / Plan / Recommendation Clinical Impression  57 y.o. male admitted to [facility] from SNF for R leg cellulitis s/p I&D.  He was recently discharged from [facility] to SNF for rehab.  He presents today with decreased endurance, decreased generalized strength, decreased balance, and decreased safety.  He would benefit from returning to SNF for rehab at discharge.      PT Assessment  Patient needs continued PT services    Follow Up Recommendations  SNF    Does the patient have the potential to tolerate intense rehabilitation     NA  Barriers to Discharge None None    Equipment Recommendations  None recommended by PT    Recommendations for Other Services   none  Frequency Min 2X/week    Precautions / Restrictions Precautions Precautions: Fall;Other (comment) (trach , PEG, foley) Precaution Comments: multiple lines, need portable O2 Required Braces or Orthoses: Other Brace/Splint Other Brace/Splint: cervical collar to support neck.  Pt likes to wear it during gait.     Pertinent Vitals/Pain VSS, increased WOB with gait DOE3/4, pt requesting deep suctioning at end of gait, RN made aware, but refused due to pt doing so well with O2 sats and no audible mucus.  I am not sure why pt is not verbalizing with his PMV.       Mobility  Bed Mobility Bed Mobility: Supine to Sit;Sitting - Scoot to Edge of Bed Supine to Sit: With rails;HOB elevated;3: Mod assist Sitting - Scoot to Edge of Bed: 4: Min assist;With rail Details for Bed Mobility Assistance: mod hand held assist to pull trunk to upright sitting.  HOB ~30 degrees.  Scooting also required pt pulling with his arms.  Weak core muscles.   Transfers Transfers: Sit to Stand;Stand to Sit Sit to Stand: 4: Min assist;With upper extremity assist;From bed Stand to  Sit: 4: Min assist;With upper extremity assist;To bed Details for Transfer Assistance: min assist to support trunk during transition from bed to RW, pt with posterior LOB and relies heavily again on upper extremity support and on legs supported by bed to get to standing.   Ambulation/Gait Ambulation/Gait Assistance: 4: Min assist Ambulation Distance (Feet): 75 Feet Assistive device: Rolling walker Ambulation/Gait Assistance Details: min assist for balance and safety.  Pt walking quickly and then stopping suddenly to lean over RW to rest.  Pt report he is no SOB or fatigued, yet all physical signs indicate that he is (increased accesory muscle breathing, increased DOE, increased flexed and soft knee gait pattern the further we went down the hallway.   Gait Pattern: Step-through pattern;Shuffle;Trunk flexed;Narrow base of support Gait velocity: >1.8 ft/sec, but too fast to be safe for him.  General Gait Details: pt quick to move, unsafe        PT Diagnosis: Difficulty walking;Abnormality of gait;Generalized weakness  PT Problem List: Decreased strength;Decreased activity tolerance;Decreased balance;Decreased mobility;Decreased knowledge of use of DME;Decreased safety awareness;Cardiopulmonary status limiting activity PT Treatment Interventions: DME instruction;Gait training;Functional mobility training;Therapeutic activities;Therapeutic exercise;Balance training;Neuromuscular re-education;Patient/family education   PT Goals Acute Rehab PT Goals PT Goal Formulation: With patient Time For Goal Achievement: 09/15/12 Potential to Achieve Goals: Good Pt will go Supine/Side to Sit: with supervision PT Goal: Supine/Side to Sit - Progress: Goal set today Pt will go Sit to Supine/Side: with supervision PT Goal: Sit to Supine/Side - Progress: Goal set  today Pt will go Sit to Stand: with supervision PT Goal: Sit to Stand - Progress: Goal set today Pt will go Stand to Sit: with supervision PT Goal:  Stand to Sit - Progress: Goal set today Pt will Transfer Bed to Chair/Chair to Bed: with supervision PT Transfer Goal: Bed to Chair/Chair to Bed - Progress: Goal set today Pt will Ambulate: >150 feet;with supervision;with rolling walker PT Goal: Ambulate - Progress: Goal set today  Visit Information  Last PT Received On: 09/01/12 Assistance Needed: +2 PT/OT Co-Evaluation/Treatment: Yes    Subjective Data  Subjective: Pt not speaking despite having PMV in place Patient Stated Goal: to go back to rehab   Prior Functioning  Home Living Available Help at Discharge: Skilled Nursing Facility Type of Home: Skilled Nursing Facility Additional Comments: Pt was ambulating with RW PTA Prior Function Driving: No Vocation: On disability Comments: PTA pt with peg and trach care by RN staff. Communication Communication: Tracheostomy;Passy-Muir valve Dominant Hand: Right    Cognition  Cognition Overall Cognitive Status: Impaired Area of Impairment: Memory Arousal/Alertness: Lethargic Orientation Level: Oriented X4 / Intact Behavior During Session: Lethargic Memory Deficits: decreased recall of where he was recieving therapy PTA    Extremity/Trunk Assessment Right Lower Extremity Assessment RLE ROM/Strength/Tone: Deficits RLE ROM/Strength/Tone Deficits: 3/5 per gross functional assessment Left Lower Extremity Assessment LLE ROM/Strength/Tone: Deficits LLE ROM/Strength/Tone Deficits: 3/5 per gross functional assessment.  No obvious asymmetries noted Trunk Assessment Trunk Assessment: Other exceptions;Kyphotic Trunk Exceptions: in sitting pt is unable to support his head over his body. he holds it up with bil hands and uses hard cervical collar for support during gait.     Balance Static Sitting Balance Static Sitting - Balance Support: No upper extremity supported;Feet supported Static Sitting - Level of Assistance: 5: Stand by assistance Static Sitting - Comment/# of Minutes: in  sitting EOB pt using bil hands to prop head up so he could see.   Static Standing Balance Static Standing - Balance Support: Bilateral upper extremity supported Static Standing - Level of Assistance: 4: Min assist  End of Session PT - End of Session Equipment Utilized During Treatment: Gait belt;Oxygen (10 L O2 for 48% TC.  ) Activity Tolerance: Patient limited by fatigue Patient left: in chair;with call bell/phone within reach    [place] B. Yashas Camilli, PT, DPT [phone]   09/01/2012, 2:08 PM

## 2012-09-01 NOTE — Progress Notes (Signed)
Patient is going to MRI and then will be transferred to 5511 via bed. Phone report has been called to Lisa,RN on [phone]. Patient has been informed of the transfer

## 2012-09-01 NOTE — Evaluation (Signed)
Occupational Therapy Evaluation Patient Details Name: Victor Durham MRN: [id number] DOB: [birth year] Today's Date: 09/01/2012 Time: 1020-1050 OT Time Calculation (min): 30 min  OT Assessment / Plan / Recommendation Clinical Impression  57 y.o. male admitted to [facility] from SNF for R leg cellulitis s/p I&D. Pt is at or near baseline. Recommend SNf for d/c planning. Ot to defer to snf level    OT Assessment  All further OT needs can be met in the next venue of care    Follow Up Recommendations       Barriers to Discharge      Equipment Recommendations       Recommendations for Other Services    Frequency       Precautions / Restrictions Precautions Precautions: Fall;Other (comment) (trach , PEG, foley) Precaution Comments: multiple lines, need portable O2 Required Braces or Orthoses: Other Brace/Splint Other Brace/Splint: cervical collar to support neck.  Pt likes to wear it during gait.     Pertinent Vitals/Pain Desaturation to 88% on 10 L 40% during evaluation    ADL  Grooming: Wash/dry face;Modified independent Where Assessed - Grooming: Supported sitting Lower Body Dressing: +1 Total assistance Where Assessed - Lower Body Dressing: Unsupported sitting (declined and pointing for therapist to do task) Toilet Transfer: Minimal assistance Toilet Transfer Method: Sit to stand Toilet Transfer Equipment: Raised toilet seat with arms (or 3-in-1 over toilet) Equipment Used: Feeding equipment;Rolling walker (oxygen tank, ) Transfers/Ambulation Related to ADLs: Pt ambulating with oxygen, IV pole, ccollar don and RW. Pt stopping placing bil forearms on RW then pushing head upright with BIL UE then starting to ambulate again. pt started declining ccollar with therapist asking questions. Pt no response to therapist questions and pulling at ccollar. C collar d/ced half way during ambulation ADL Comments: Pt with hyptoxcity of neck extension. Pt with ccollar present in room. pt declines  ccollar and then pointing for therapist to get it. pt with neck flexed, scapula abducted and elevated for a flexed posture and hip flexion while using RW. Pt requires multiple rest breaks with DOE. Pt with desaturations to 88% during session. Pt impulsive declines to use PMV and requesting therapist assist with all challenges during session.     OT Diagnosis:    OT Problem List:   OT Treatment Interventions:     OT Goals    Visit Information  Last OT Received On: 09/01/12 Assistance Needed: +2 PT/OT Co-Evaluation/Treatment: Yes    Subjective Data  Subjective: no verbalizations only pointing and mouthing Patient Stated Goal: none stated   Prior Functioning     Home Living Available Help at Discharge: Skilled Nursing Facility Type of Home: Skilled Nursing Facility Additional Comments: Pt was ambulating with RW PTA Prior Function Driving: No Vocation: On disability Comments: PTA pt with peg and trach care by RN staff. Communication Communication: Tracheostomy;Passy-Muir valve Dominant Hand: Right         Vision/Perception Vision - History Baseline Vision: Wears glasses all the time Patient Visual Report: No change from baseline   Cognition  Cognition Overall Cognitive Status: Impaired Area of Impairment: Memory Arousal/Alertness: Lethargic Orientation Level: Oriented X4 / Intact Behavior During Session: Lethargic Memory Deficits: decreased recall of where he was recieving therapy PTA    Extremity/Trunk Assessment Right Upper Extremity Assessment RUE ROM/Strength/Tone: Deficits;Due to impaired cognition RUE ROM/Strength/Tone Deficits: 3 OUT 5 ,  Left Upper Extremity Assessment LUE ROM/Strength/Tone: Deficits;Due to impaired cognition LUE ROM/Strength/Tone Deficits: 3 OUT 5 Right Lower Extremity Assessment RLE ROM/Strength/Tone: Deficits  RLE ROM/Strength/Tone Deficits: 3/5 per gross functional assessment Left Lower Extremity Assessment LLE ROM/Strength/Tone:  Deficits LLE ROM/Strength/Tone Deficits: 3/5 per gross functional assessment.  No obvious asymmetries noted Trunk Assessment Trunk Assessment: Other exceptions;Kyphotic Trunk Exceptions: in sitting pt is unable to support his head over his body. he holds it up with bil hands and uses hard cervical collar for support during gait.       Mobility Bed Mobility Bed Mobility: Supine to Sit;Sitting - Scoot to Edge of Bed Supine to Sit: With rails;HOB elevated;3: Mod assist Sitting - Scoot to Edge of Bed: 4: Min assist;With rail Details for Bed Mobility Assistance: mod hand held assist to pull trunk to upright sitting.  HOB ~30 degrees.  Scooting also required pt pulling with his arms.  Weak core muscles.   Transfers Transfers: Sit to Stand;Stand to Sit Sit to Stand: 4: Min assist;With upper extremity assist;From bed Stand to Sit: 4: Min assist;With upper extremity assist;To bed Details for Transfer Assistance: min assist to support trunk during transition from bed to RW, pt with posterior LOB and relies heavily again on upper extremity support and on legs supported by bed to get to standing.       Exercise     Balance Static Sitting Balance Static Sitting - Balance Support: No upper extremity supported;Feet supported Static Sitting - Level of Assistance: 5: Stand by assistance Static Sitting - Comment/# of Minutes: in sitting EOB pt using bil hands to prop head up so he could see.   Static Standing Balance Static Standing - Balance Support: Bilateral upper extremity supported Static Standing - Level of Assistance: 4: Min assist   End of Session OT - End of Session Activity Tolerance: Patient tolerated treatment well Patient left: in chair;with call bell/phone within reach Nurse Communication: Mobility status;Precautions  GO     Lucile Shutters 09/01/2012, 3:11 PM Pager: [phone]

## 2012-09-01 NOTE — Progress Notes (Addendum)
INITIAL NUTRITION ASSESSMENT  DOCUMENTATION CODES Per approved criteria  -Not Applicable   INTERVENTION: 1. Add NPO status. 2. Continue Jevity 1.2 at 70 ml/hr, this provides 2016 kcal, 94 grams protein, 1356 ml free water. 3. Add 30 ml Prostat via tube BID for additional protein needs to promote wound healing. This will provide an additional 200 kcal and 30 grams of protein. Total TF regimen will now provide: 2216 kcal, 124 grams protein, 1356 ml free water. 4. Continue free water flushes of 200 ml q 4 hours. 5. Add Adult Enteral Nutrition Protocol. 6. RD to continue to follow nutrition care plan.  NUTRITION DIAGNOSIS: Inadequate oral intake related to inability to eat as evidenced by NPO status.   Goal: Intake to meet >90% of estimated nutrition needs.  Monitor:  weight trends, lab trends, I/O's, TF tolerance  Reason for Assessment: MD Consult for TF Initiation/Management, Malnutrition Screening Tool, Low Braden Score  57 y.o. male  Admitting Dx: Cellulitis and abscess of leg  ASSESSMENT: PMH of myasthenia gravis (recently requiring extended hospitalization with PEG and trach), HTN, DVT and HLD who presented from SNF for complaint of SOB. Found to have RLE redness, swelling, pain, and superficial abscess. Bedside I&D performed in ED.  WOC RN saw pt this morning. Per notes pt with full thickness wound on R shoulder that has spontaneously ruptured that may possibly also need I&D. 4 full thickness wounds to sacrum. Full thickness wound to R leg.   Currently tolerating trach collar.  RD consulted for initiation/management of enteral nutrition. Pt is currently ordered for Jevity 1.2 at 70 ml/hr. This provides: 2016 kcal, 94 grams protein, 1356 ml free water. Per nursing home records pt receives Fibersource at 70 ml/hr. This formula is comparable to Jevity 1.2, which patient is currently receiving. RN reports that TF was on hold last evening as pt had gastric residual >200. TF resumed  this morning at approximately 0900. RD to add Adult Enteral Nutrition Protocol for appropriate gastric residual guidelines.  Per nursing home MAR, pt receives 200 ml water flush via tube q 4 hours, provides an additional 1200 ml water daily. Pt is currently ordered for this at this time.  Pt with severe temporal wasting evident. Pt is at risk for malnutrition, unable to complete full nutrition-focused physical exam at this time.  Height: Ht Readings from Last 1 Encounters:  08/31/12 6\' 2"  (1.88 m)    Weight: Wt Readings from Last 1 Encounters:  09/01/12 155 lb 3.3 oz (70.4 kg)    Ideal Body Weight: 190 lb/86.4 kg  % Ideal Body Weight: 81%  Wt Readings from Last 10 Encounters:  09/01/12 155 lb 3.3 oz (70.4 kg)  08/04/12 156 lb 12 oz (71.1 kg)  08/04/12 156 lb 12 oz (71.1 kg)  06/27/09 242 lb 11.2 oz (110.088 kg)  11/06/07 235 lb 6.4 oz (106.777 kg)  02/19/07 232 lb 4.8 oz (105.371 kg)  08/19/06 228 lb 8 oz (103.647 kg)    Usual Body Weight: 156 lb  % Usual Body Weight: 100%  BMI:  Body mass index is 19.92 kg/(m^2). Weight is WNL.  Estimated Nutritional Needs: Kcal: 2000 - 2200 Protein: 90 - 110 grams Fluid: at least 2 liters daily  Skin: 6 full thickness wounds: 1 x R shoulder, 4 x sacrum, 1 x RLE  Diet Order:    EDUCATION NEEDS: -No education needs identified at this time   Intake/Output Summary (Last 24 hours) at 09/01/12 0943 Last data filed at 09/01/12 [phone]  Gross per 24 hour  Intake   1140 ml  Output    925 ml  Net    215 ml    Last BM: 3/23  Labs:   Recent Labs Lab 08/31/12 1252 08/31/12 1732  NA 135 137  K 5.4* 4.3  CL 103 98  CO2  --  27  BUN 24* 17  CREATININE 0.90 0.53  CALCIUM  --  9.1  MG  --  2.1  PHOS  --  5.0*  GLUCOSE 125* 186*    CBG (last 3)  No results found for this basename: GLUCAP,  in the last 72 hours  Scheduled Meds: . albuterol  2.5 mg Nebulization Q8H  . aspirin  81 mg Per Tube Daily  . LORazepam  1 mg  Intravenous Once  . pantoprazole sodium  40 mg Per Tube Q1200  . piperacillin-tazobactam (ZOSYN)  IV  3.375 g Intravenous Q8H  . predniSONE  35 mg Per Tube Q breakfast  . pyridostigmine  90 mg Per Tube 5 X Daily  . sodium chloride  3 mL Intravenous Q12H  . sodium chloride  3 mL Intravenous Q12H  . sterile water for irrigation  200 mL Irrigation Q4H  . traZODone  100 mg Per Tube QHS  . vancomycin  1,000 mg Intravenous Q12H    Continuous Infusions: . feeding supplement (JEVITY 1.2 CAL) 1,000 mL (08/31/12 1946)    Past Medical History  Diagnosis Date  . DVT (deep venous thrombosis)   . Hypertension   . High cholesterol   . Myasthenia gravis   . Anxiety   . Dysphagia 06/30/12    Past Surgical History  Procedure Laterality Date  . Esophagogastroduodenoscopy  07/11/2012    Procedure: ESOPHAGOGASTRODUODENOSCOPY (EGD);  Surgeon: Shirley Friar, MD;  Location: [facility];  Service: Endoscopy;  Laterality: N/A;  BEDSIDE  . Tracheostomy N/A     Jarold Motto MS, RD, LDN Pager: [phone] After-hours pager: [phone]

## 2012-09-01 NOTE — Clinical Social Work Psychosocial (Signed)
     Clinical Social Work Department BRIEF PSYCHOSOCIAL ASSESSMENT 09/01/2012  Patient:  KALVYN, DESA     Account Number:  [id number]     Admit date:  08/31/2012  Clinical Social Worker:  Lourdes Sledge  Date/Time:  09/01/2012 10:56 AM  Referred by:  RN  Date Referred:  09/01/2012 Referred for  SNF Placement   Other Referral:   Interview type:  Family Other interview type:   CSW completed assessment with pt father Sherill Wegener [name] [phone]    PSYCHOSOCIAL DATA Living Status:  FACILITY Admitted from facility:  [facility] Level of care:  Skilled Nursing Facility Primary support name:  Anson Peddie [name] [phone] Primary support relationship to patient:  PARENT Degree of support available:   Pt father appears actively involved in pt care.    CURRENT CONCERNS Current Concerns  Post-Acute Placement   Other Concerns:    SOCIAL WORK ASSESSMENT / PLAN CSW informed that pt was admitted from [facility].    CSW informed from RN that pt capacity is questionable. CSW informed that pt father is actively in care. CSW contacted pt father to confirm plan for pt to return to [facility] whenever medically ready. Father confirmed that pt has been at [facility] for a few weeks and he supposed the plan would be for pt to return. CSW informed father that CSW would arrange transport back to [facility] whenever pt was discharged.    CSW also contacted [facility] and left a message inquiring whether pt could return at discharge.   Assessment/plan status:  Psychosocial Support/Ongoing Assessment of Needs Other assessment/ plan:   Information/referral to community resources:   No resources needed at this time.    PATIENTS/FAMILYS RESPONSE TO PLAN OF CARE: CSW completed assessment with pt father as pt capacity questioned (per RN.)    CSW to assist with dc when pt is medically ready.

## 2012-09-01 NOTE — Progress Notes (Signed)
PGY-1 Daily Progress Note Family Medicine Teaching Service [name] MS4 Service Pager: [phone]   Subjective: Calm this morning with no pain. Overnight was anxious and with labored breathing though sats remained above 92%. Patient was requesting intubation, but vitals were stable on 40% FiO2.  With subsequent ativan doses, increased WOB and anxiety resolved.   Objective:  VITALS Temp:  [97 F (36.1 C)-100.6 F (38.1 C)] 97 F (36.1 C) (03/24 0730) Pulse Rate:  [69-137] 72 (03/24 0754) Resp:  [10-29] 16 (03/24 0754) BP: (91-169)/(64-96) 91/64 mmHg (03/24 0730) SpO2:  [91 %-100 %] 100 % (03/24 0754) FiO2 (%):  [35 %-40 %] 40 % (03/24 0754) Weight:  [155 lb 3.3 oz (70.4 kg)-156 lb 1.4 oz (70.8 kg)] 155 lb 3.3 oz (70.4 kg) (03/24 0356)  In/Out  Intake/Output Summary (Last 24 hours) at 09/01/12 0928 Last data filed at 09/01/12 0732  Gross per 24 hour  Intake   1140 ml  Output    925 ml  Net    215 ml    Physical Exam: Gen:  NAD HEENT: moist mucous membranes CV: Regular rate and rhythm, no murmurs rubs or gallops PULM: transmitted upper airway sounds throughout. Good air movement.   ABD: soft/nontender/nondistended/normal bowel sounds; PEG site c/d/i EXT:  RLE warm and erythematous to knee. Mild swelling. New dressing c/d/i. Would not examined due to dressing placement. Did not turn patient at this time as wound care team had just finished thorough exam and dressing changes.  Neuro: Non-verbal. Drowsy but responds with eye contact and head nods appropriately to questions.    MEDS Scheduled Meds: . albuterol  2.5 mg Nebulization Q8H  . aspirin  81 mg Per Tube Daily  . LORazepam  1 mg Intravenous Once  . pantoprazole sodium  40 mg Per Tube Q1200  . piperacillin-tazobactam (ZOSYN)  IV  3.375 g Intravenous Q8H  . predniSONE  35 mg Per Tube Q breakfast  . pyridostigmine  90 mg Per Tube 5 X Daily  . sodium chloride  3 mL Intravenous Q12H  . sodium chloride  3 mL Intravenous Q12H   . sterile water for irrigation  200 mL Irrigation Q4H  . traZODone  100 mg Per Tube QHS  . vancomycin  1,000 mg Intravenous Q12H   Continuous Infusions: . feeding supplement (JEVITY 1.2 CAL) 1,000 mL (08/31/12 1946)   PRN Meds:.sodium chloride, acetaminophen, acetaminophen, ALPRAZolam, HYDROcodone-acetaminophen, ondansetron (ZOFRAN) IV, ondansetron, sodium chloride  Labs and imaging:   CBC  Recent Labs Lab 08/31/12 1113 08/31/12 1252 09/01/12 0912  WBC 12.6*  --  12.5*  HGB 15.0 15.6 14.1  HCT 42.7 46.0 42.0  PLT 216  --  151   BMET/CMET  Recent Labs Lab 08/31/12 1252 08/31/12 1732 09/01/12 0912  NA 135 137 137  K 5.4* 4.3 4.4  CL 103 98 98  CO2  --  27 34*  BUN 24* 17 15  CREATININE 0.90 0.53 0.46*  CALCIUM  --  9.1 9.3  PROT  --  7.1 7.4  BILITOT  --  1.3* 0.8  ALKPHOS  --  71 86  ALT  --  50 44  AST  --  19 15  GLUCOSE 125* 186* 86   Results for orders placed during the hospital encounter of 08/31/12 (from the past 24 hour(s))  CULTURE, BLOOD (ROUTINE X 2)     Status: None   Collection Time    08/31/12 11:07 AM      Result Value Range  Specimen Description BLOOD RIGHT ARM     Special Requests BOTTLES DRAWN AEROBIC AND ANAEROBIC 10CC     Culture  Setup Time 08/31/2012 20:34     Culture       Value: GRAM POSITIVE COCCI IN CLUSTERS     Note: Gram Stain Report Called to,Read Back By and Verified With: PAULINE COLQUHOUN 09/01/12 0830 BY SMITHERSJ   Report Status PENDING    CBC WITH DIFFERENTIAL     Status: Abnormal   Collection Time    08/31/12 11:13 AM      Result Value Range   WBC 12.6 (*) 4.0 - 10.5 K/uL   RBC 4.78  4.22 - 5.81 MIL/uL   Hemoglobin 15.0  13.0 - 17.0 g/dL   HCT 16.1  09.6 - 04.5 %   MCV 89.3  78.0 - 100.0 fL   MCH 31.4  26.0 - 34.0 pg   MCHC 35.1  30.0 - 36.0 g/dL   RDW 40.9 (*) 81.1 - 91.4 %   Platelets 216  150 - 400 K/uL   Neutrophils Relative 88 (*) 43 - 77 %   Neutro Abs 11.1 (*) 1.7 - 7.7 K/uL   Lymphocytes Relative 6 (*)  12 - 46 %   Lymphs Abs 0.7  0.7 - 4.0 K/uL   Monocytes Relative 6  3 - 12 %   Monocytes Absolute 0.8  0.1 - 1.0 K/uL   Eosinophils Relative 0  0 - 5 %   Eosinophils Absolute 0.0  0.0 - 0.7 K/uL   Basophils Relative 0  0 - 1 %   Basophils Absolute 0.0  0.0 - 0.1 K/uL  APTT     Status: Abnormal   Collection Time    08/31/12 11:13 AM      Result Value Range   aPTT 55 (*) 24 - 37 seconds  PROTIME-INR     Status: Abnormal   Collection Time    08/31/12 11:13 AM      Result Value Range   Prothrombin Time 31.9 (*) 11.6 - 15.2 seconds   INR 3.33 (*) 0.00 - 1.49  CULTURE, BLOOD (ROUTINE X 2)     Status: None   Collection Time    08/31/12 11:14 AM      Result Value Range   Specimen Description BLOOD RIGHT HAND     Special Requests BOTTLES DRAWN AEROBIC AND ANAEROBIC 6CC     Culture  Setup Time 08/31/2012 20:34     Culture       Value: GRAM POSITIVE COCCI IN CLUSTERS     Note: Gram Stain Report Called to,Read Back By and Verified With: PAULINE COLQUHOUN 09/01/12 0830 BY SMITHERSJ   Report Status PENDING    CULTURE, ROUTINE-ABSCESS     Status: None   Collection Time    08/31/12 12:07 PM      Result Value Range   Specimen Description ABSCESS RIGHT LOWER LEG     Special Requests NONE     Gram Stain PENDING     Culture Culture reincubated for better growth     Report Status PENDING    POCT I-STAT TROPONIN I     Status: None   Collection Time    08/31/12 12:50 PM      Result Value Range   Troponin i, poc 0.00  0.00 - 0.08 ng/mL   Comment 3           POCT I-STAT, CHEM 8     Status: Abnormal   Collection  Time    08/31/12 12:52 PM      Result Value Range   Sodium 135  135 - 145 mEq/L   Potassium 5.4 (*) 3.5 - 5.1 mEq/L   Chloride 103  96 - 112 mEq/L   BUN 24 (*) 6 - 23 mg/dL   Creatinine, Ser 7.82  0.50 - 1.35 mg/dL   Glucose, Bld 956 (*) 70 - 99 mg/dL   Calcium, Ion 2.13 (*) 1.12 - 1.23 mmol/L   TCO2 35  0 - 100 mmol/L   Hemoglobin 15.6  13.0 - 17.0 g/dL   HCT 08.6  57.8 - 46.9  %  MRSA PCR SCREENING     Status: None   Collection Time    08/31/12  5:22 PM      Result Value Range   MRSA by PCR NEGATIVE  NEGATIVE  COMPREHENSIVE METABOLIC PANEL     Status: Abnormal   Collection Time    08/31/12  5:32 PM      Result Value Range   Sodium 137  135 - 145 mEq/L   Potassium 4.3  3.5 - 5.1 mEq/L   Chloride 98  96 - 112 mEq/L   CO2 27  19 - 32 mEq/L   Glucose, Bld 186 (*) 70 - 99 mg/dL   BUN 17  6 - 23 mg/dL   Creatinine, Ser 6.29  0.50 - 1.35 mg/dL   Calcium 9.1  8.4 - 52.8 mg/dL   Total Protein 7.1  6.0 - 8.3 g/dL   Albumin 3.4 (*) 3.5 - 5.2 g/dL   AST 19  0 - 37 U/L   ALT 50  0 - 53 U/L   Alkaline Phosphatase 71  39 - 117 U/L   Total Bilirubin 1.3 (*) 0.3 - 1.2 mg/dL   GFR calc non Af Amer >90  >90 mL/min   GFR calc Af Amer >90  >90 mL/min  MAGNESIUM     Status: None   Collection Time    08/31/12  5:32 PM      Result Value Range   Magnesium 2.1  1.5 - 2.5 mg/dL  PHOSPHORUS     Status: Abnormal   Collection Time    08/31/12  5:32 PM      Result Value Range   Phosphorus 5.0 (*) 2.3 - 4.6 mg/dL   Portable Chest 1 View  09/01/2012  *RADIOLOGY REPORT*  Clinical Data: Shortness of breath  PORTABLE CHEST - 1 VIEW  Comparison: 08/31/2012  Findings: A tracheostomy tube is again seen. The cardiac shadow is stable.  The lungs are well-aerated bilaterally.  Minimal left basilar atelectasis is seen.  IMPRESSION: No left basilar atelectasis.   Original Report Authenticated By: Alcide Clever, M.D.    Dg Chest Port 1 View  08/31/2012  *RADIOLOGY REPORT*  Clinical Data: Chest pain  PORTABLE CHEST - 1 VIEW  Comparison: 08/07/2012  Findings: Tracheostomy device stable position.  Patchy subsegmental atelectasis or infiltrates in both lung bases, slightly improved since previous exam.  No effusion.  Heart size normal.  Regional bones unremarkable.  IMPRESSION:  Slight improvement in bibasilar atelectasis or infiltrates.   Original Report Authenticated By: D. Andria Rhein, MD          Assessment/Plan 57 yo M with RLE cellulitis and multiple abscesses  # Cellulitis with abscess now with GPC in blood- RLE: I&D in the ED. Given one dose of vancomycin. Low grade fever and leukocytosis to 12.6. Does not appear septic at admission.  - Back  abscess--Was not drained on admission as not painful or erythematous. Per wound care team this AM 3/24, this still has mild drainage but is grossly unchanged overnight. With improvement in RLE swelling overnight likely 2/2 to abx, will monitor today  - [facility] care team feels these are also abscesses vs. Decubitus ulcers as they are not on bony prominences.   - Started on Vanc and add Zosyn  - Culture of RLE showed Gram+ cocci in clusters.  MRSA negative.  F/U speciation; Will need echo if staph aureus.  -given GPC clusters bacteremia, suspicion staph aureus, discontinue zosyn 3/24, continue vanc, add cephazolin for possible MSSA - MRI ordered to assess the extend of abscess. Patient cannot lay flat to perform MRI  # Myasthenia Gravis - Patient recently diagnosed, but well controlled  - Continue prednisone and pyridostigmine as prescribed as an outpatient  - No neurology consult at this time  - Monitor O2 sats and work of breathing  # Chronic Trach -  - Trach care with frequent suctioning  - Continue trach collar  - Monitor O2 sats  - Give medications per tube  - Oral care per protocol  - Albuterol prn   # Chronic Peg-  - Nutrition consult for feeds  - PEG care   # H/o DVT on coumadin- INR supertherapeutic at admission  - No calf pain at this time or signs of new DVT  - Warfarin per pharmacy   # Hyperkalemia -  - Repeat afternoon labs and AM labs; resolved 3/24 - Give kayexalate if goes higher than 6   # Tachycardia-  - Monitor on telemetry. Likely secondary to increased work of breathing w/ anxiety. Improved 3/24 w/ ativan given for anxiety.   - Consider beta blocker if hypertensive   # SNF resident  -  PT/OT  - CSW for placement. Came from [facility]   FEN/GI - Per nutrition.  Ppx- Heparin Sq   Dispo- Pending improvement and transition to PO antibiotics.  Code: FULL  Contact: Dredyn Gubbels, father: [phone]    [name] MS4   Family Medicine Upper Level Addendum:   I have seen and examined the patient independently, discussed with [name] MSIV, fully reviewed the progress note and agree with it's contents with the additions as noted in blue text. My independent exam is below.   S: states breathing more comfortably after benzo and back at baseline breathing status. No pain in legs or back.   O: BP 91/64  Pulse 86  Temp(Src) 97.5 F (36.4 C) (Axillary)  Resp 27  Ht 6\' 2"  (1.88 m)  Wt 155 lb 3.3 oz (70.4 kg)  BMI 19.92 kg/m2  SpO2 94% Gen: NAD, resting in bed, trach in place at 40% FIO2 HEENT: MMM CV: RRR no mrg  Lungs: CTAB other than transmitted upper airway sounds from trach Abd: soft/nontender/nondistended/normal bowel sounds. PEG site nml.  MSK: moves all extremities, RLE warm compared to left, bandaged over area where abscess was drained. 1+ edema on right compared to none on left. Did not examine back.   Skin: warm and dry, no rash  Neuro: able to give thumbs up, able to respond to questions through writing and head nodding.   A/P:  57 yo M with history of myastenia gravis with trach d/t history of respiratory difficulties p/w RLE cellulitis and multiple abscesses now with GPC bacteremia.  -continue Vanc. D/c zosyn and add cefazolin in case MSSA.  -myasthenia gravis-continue home meds. Double dose of steroids to  70mg  while acutely ill, will continue while in house and resume home dose if < 7 days.  -if any clinical deterioration or if breathing issues present again, low threshold to consult neurology. Concern would be for myasthenic crisis btu appears stable at present.   Tana Conch, MD, PGY2 09/01/2012 1:32 PM

## 2012-09-01 NOTE — Consult Note (Addendum)
WOC consult Note Reason for Consult: Consult requested for several wounds.  Pt had I&D of right leg abscess yesterda.y Wound type: Right posterior shoulder did not have I&D but spontaneously ruptured.  Full thickness wound 1X1X.1cm, 100% dark red, indurated surrounding wound, small thick tan pus. Erythemia surrounding area to approx 2 cm. Tender to touch. If no s/s improvement with IV antibiotics in the next few days, then pt may need surgical consult for I&D.  Discussed plan of care with primary team at bedside.    Sacrum with locations of 4 full thickness wounds.  These were previously reported to be stage 2 wounds, but appearance is unusual and location is not over bony prominence.  Unknown etiology, but they could be the result of previous abscess areas which spontaneously ruptured and drained prior to hospital stay.  Each site .5X.5X.2cm, dark yellow-red dry wound beds, in odor, minimal tan drainage, located in scattered areas across sacrum.  Right posterior leg with full thickness post-I&D drainage site. 1X1X1 cm with tunneling at 12 O'clock to 1 cm.  Removed gauze packing strip, mod dark red drainage, no odor.  Erythremia surrounding wound to 3 cm, tender to touch.    Dressing procedure/placement/frequency: Foam dressing to posterior right shoulder and sacrum to protect and absorb drainage. Iodoform packing to right posterior leg to absorb drainage and promote healing. Will not plan to follow further unless re-consulted.  [address] RN, MSN, [employer]  [phone]

## 2012-09-01 NOTE — Progress Notes (Signed)
CRITICAL VALUE ALERT  Critical value received:  Blood Cultures x's 2 sets  Date of notification:  09/01/2012   Time of notification:  0840  Critical value read back:yes  Nurse who received alert:  [employer]   MD notified (1st page):  yes  Time of first page:  [phone]  MD notified (2nd page):  Time of second page:  Responding MD:  Charlynn Court student  Time MD responded:  [phone]

## 2012-09-01 NOTE — Progress Notes (Signed)
Pt continues to call nurse in the room asking to be Intubated, sat's 97/% on 40 percent track collar, resp  14-24 vss, pt rate is regular and normal until told this by the nurse in which he then breathes ragged and irregular only to return to normal when nurse leaves the room. Pt has been given prn xanax with out any relief. Paged Dr. Mikel Cella to notify new orders received will continue to monitor.

## 2012-09-01 NOTE — Progress Notes (Signed)
Pt admitted to the unit. Pt is alert and oriented. Pt oriented to room, staff, and call bell. Bed in lowest position. Full assessment to Epic. Call bell with in reach. Told to call for assists. Will continue to monitor.  Bridget  E  

## 2012-09-01 NOTE — Progress Notes (Addendum)
ANTICOAGULATION CONSULT NOTE - Follow Up Consult  Pharmacy Consult for warfarin  Indication: DVT  No Known Allergies  Patient Measurements: Height: 6\' 2"  (188 cm) Weight: 155 lb 3.3 oz (70.4 kg) IBW/kg (Calculated) : 82.2  Vital Signs: Temp: 97 F (36.1 C) (03/24 0730) Temp src: Oral (03/24 0730) BP: 103/65 mmHg (03/24 0635) Pulse Rate: 72 (03/24 0754)  Labs:  Recent Labs  08/31/12 1113 08/31/12 1252 08/31/12 1732  HGB 15.0 15.6  --   HCT 42.7 46.0  --   PLT 216  --   --   APTT 55*  --   --   LABPROT 31.9*  --   --   INR 3.33*  --   --   CREATININE  --  0.90 0.53    Estimated Creatinine Clearance: 102.7 ml/min (by C-G formula based on Cr of 0.53).   Medications:  Warfarin 5-7.5mg  daily   Assessment: 57 year old male with history of DVT on warfarin pta. INR was supratherapeutic yesterday. INR still elevated this morning. hgb was within normal limit yesterday but is down a g/dl today. No bleeding noted overnight.  **Patient with supratherapeutic INR, will d/c sq heparin as it  increases patient's risk for bleeding with elevated INR. Patient meets dvt px with elevated INR.  Goal of Therapy:  INR 2-3 Monitor platelets by anticoagulation protocol: Yes   Plan:  Hold warfarin -will redose warfarin when INR<3 D/c Sq heparin  Follow for s/s of bleeding  Sheppard Coil PharmD., BCPS Clinical Pharmacist Pager [phone] 09/01/2012 8:56 AM

## 2012-09-02 ENCOUNTER — Inpatient Hospital Stay (HOSPITAL_COMMUNITY): Payer: Medicaid Other

## 2012-09-02 LAB — BLOOD GAS, ARTERIAL
Bicarbonate: 37.5 mEq/L — ABNORMAL HIGH (ref 20.0–24.0)
Patient temperature: 98.6
TCO2: 40.7 mmol/L (ref 0–100)
pH, Arterial: 7.172 — CL (ref 7.350–7.450)
pO2, Arterial: 148 mmHg — ABNORMAL HIGH (ref 80.0–100.0)

## 2012-09-02 LAB — PROTIME-INR: Prothrombin Time: 25.2 seconds — ABNORMAL HIGH (ref 11.6–15.2)

## 2012-09-02 MED ORDER — WARFARIN SODIUM 5 MG PO TABS
5.0000 mg | ORAL_TABLET | Freq: Once | ORAL | Status: AC
  Administered 2012-09-02: 5 mg via ORAL
  Filled 2012-09-02: qty 1

## 2012-09-02 MED ORDER — VANCOMYCIN HCL 10 G IV SOLR
1250.0000 mg | Freq: Three times a day (TID) | INTRAVENOUS | Status: DC
  Administered 2012-09-02 – 2012-09-03 (×3): 1250 mg via INTRAVENOUS
  Filled 2012-09-02 (×6): qty 1250

## 2012-09-02 MED ORDER — LORAZEPAM 2 MG/ML IJ SOLN: 1.0000 mg | Freq: Four times a day (QID) | INTRAMUSCULAR | Status: DC | PRN

## 2012-09-02 MED ORDER — SODIUM CHLORIDE 0.9 % IJ SOLN
10.0000 mL | INTRAMUSCULAR | Status: DC | PRN
  Administered 2012-09-13 – 2012-09-20 (×5): 10 mL

## 2012-09-02 NOTE — Progress Notes (Signed)
Pt. Seems to be more lethargic barely responds to open eyes and squezzing  hand as compared to early assessment. Dr. Berline Chough was notified with the change of pts. Mental status. Resp. In and ABG was done. Pt. Cont. To be hard to arouse. MD in and assessed. Result of blood gas in and MD made aware. Pt. To be transferred to  ICU.,

## 2012-09-02 NOTE — Consult Note (Signed)
[facility]     Reason for Consult:Staph aureus bacteremia    Referring Physician: Dr Jennette Kettle, SAB protocol  Principal Problem:   Cellulitis and abscess of leg Active Problems:   HYPERTENSION, BENIGN SYSTEMIC   Myasthenia gravis   DVT (deep venous thrombosis)   Tracheostomy status   . albuterol  2.5 mg Nebulization Q8H  . aspirin  81 mg Per Tube Daily  .  ceFAZolin (ANCEF) IV  2 g Intravenous Q8H  . feeding supplement  30 mL Per Tube BID  . LORazepam  1 mg Intravenous Once  . pantoprazole sodium  40 mg Per Tube Q1200  . predniSONE  70 mg Per Tube Q breakfast  . pyridostigmine  90 mg Per Tube 5 X Daily  . sodium chloride  3 mL Intravenous Q12H  . sodium chloride  3 mL Intravenous Q12H  . sterile water for irrigation  200 mL Irrigation Q4H  . traZODone  100 mg Per Tube QHS  . vancomycin  1,000 mg Intravenous Q12H  . warfarin  5 mg Oral ONCE-1800  . Warfarin - Pharmacist Dosing Inpatient   Does not apply q1800    Recommendations: Continue with vancomycin and cefazolin pending senstivities TTE May consider TEE which if negative would shorten duration, though with MG, it may not be possible Repeat blood cultures to assure clearance   Assessment: He has Staph aureus bacteremia likely from the cellulitis.  As is the case of all SAB, there is potential seeding to his heart valves and will need evaluation.  At this time, I am unable to assess if he has any other possible sources such as discs or abscesses any where else due to his respiratory status.     Antibiotics: Vancomycin day 3 Cefazolin day 2  HPI: Victor Durham is a 57 y.o. male with myasthenia gravis on chronic steroids who had presented on 3/23 with SOB at his SNF.  He was noted to have RLE erythema and edema c/w cellulitis with abscess, which was drained.  Cultures from the wound and blood cultures, 2/2 are positive for Staph aureus.  At this time, he is not communicating due to  respiratory distress.  By report, he has been notably anxious during the hospitalization.  He is taking 70 mg of prednisone daily.     Review of Systems: unable to assess  Past Medical History  Diagnosis Date  . DVT (deep venous thrombosis)   . Hypertension   . High cholesterol   . Myasthenia gravis   . Anxiety   . Dysphagia 06/30/12    History  Substance Use Topics  . Smoking status: Never Smoker   . Smokeless tobacco: Not on file  . Alcohol Use: No    History reviewed. No pertinent family history. No Known Allergies  OBJECTIVE: Blood pressure 142/87, pulse 98, temperature 97.5 F (36.4 C), temperature source Oral, resp. rate 24, height 6\' 2"  (1.88 m), weight 155 lb 3.2 oz (70.398 kg), SpO2 95.00%. General: Awake, not communicating, in mild respiratory distress Skin: RLE with some chronic vascular changes, warm Lungs: CTA b, no crackles or wheezes noted Cor: Tachy RR without m/r/g Abdomen: soft, nt, nd, +bs   Microbiology: Recent Results (from the past 240 hour(s))  CULTURE, BLOOD (ROUTINE X 2)     Status: None   Collection Time    08/31/12 11:07 AM      Result Value Range Status   Specimen Description BLOOD RIGHT ARM   Final  Special Requests BOTTLES DRAWN AEROBIC AND ANAEROBIC 10CC   Final   Culture  Setup Time 08/31/2012 20:34   Final   Culture     Final   Value: STAPHYLOCOCCUS AUREUS     Note: Gram Stain Report Called to,Read Back By and Verified With: PAULINE COLQUHOUN 09/01/12 0830 BY SMITHERSJ   Report Status PENDING   Incomplete  CULTURE, BLOOD (ROUTINE X 2)     Status: None   Collection Time    08/31/12 11:14 AM      Result Value Range Status   Specimen Description BLOOD RIGHT HAND   Final   Special Requests BOTTLES DRAWN AEROBIC AND ANAEROBIC [facility]   Final   Culture  Setup Time 08/31/2012 20:34   Final   Culture     Final   Value: STAPHYLOCOCCUS AUREUS     Note: RIFAMPIN AND GENTAMICIN SHOULD NOT BE USED AS SINGLE DRUGS FOR TREATMENT OF STAPH  INFECTIONS.     Note: Gram Stain Report Called to,Read Back By and Verified With: PAULINE COLQUHOUN 09/01/12 0830 BY SMITHERSJ   Report Status PENDING   Incomplete  CULTURE, ROUTINE-ABSCESS     Status: None   Collection Time    08/31/12 12:07 PM      Result Value Range Status   Specimen Description ABSCESS RIGHT LOWER LEG   Final   Special Requests NONE   Final   Gram Stain     Final   Value: RARE WBC PRESENT,BOTH PMN AND MONONUCLEAR     NO SQUAMOUS EPITHELIAL CELLS SEEN     FEW GRAM POSITIVE COCCI     IN PAIRS IN CLUSTERS   Culture     Final   Value: MODERATE STAPHYLOCOCCUS AUREUS     Note: RIFAMPIN AND GENTAMICIN SHOULD NOT BE USED AS SINGLE DRUGS FOR TREATMENT OF STAPH INFECTIONS.   Report Status PENDING   Incomplete  MRSA PCR SCREENING     Status: None   Collection Time    08/31/12  5:22 PM      Result Value Range Status   MRSA by PCR NEGATIVE  NEGATIVE Final   Comment:            The GeneXpert MRSA Assay (FDA     approved for NASAL specimens     only), is one component of a     comprehensive MRSA colonization     surveillance program. It is not     intended to diagnose MRSA     infection nor to guide or     monitor treatment for     MRSA infections.    Staci Righter, MD [facility] [phone] pager  [phone] cell 09/02/2012, 11:11 AM

## 2012-09-02 NOTE — Progress Notes (Signed)
Patient transferred to unit 5500 and to this CSW today- patient admitted from [facility] and planning return at d/c. CSW will update and follow along- Reece Levy, MSW, [employer] [phone]

## 2012-09-02 NOTE — Progress Notes (Signed)
ANTICOAGULATION CONSULT NOTE - Follow Up Consult  Pharmacy Consult for Coumadin Indication: Recent DVT  No Known Allergies  Patient Measurements: Height: 6\' 2"  (188 cm) Weight: 155 lb 3.2 oz (70.398 kg) IBW/kg (Calculated) : 82.2 Heparin Dosing Weight:   Vital Signs: Temp: 97.5 F (36.4 C) (03/25 0541) Temp src: Oral (03/25 0541) BP: 142/87 mmHg (03/25 0541) Pulse Rate: 91 (03/25 0541)  Labs:  Recent Labs  08/31/12 1113 08/31/12 1252 08/31/12 1732 09/01/12 0912 09/02/12 0606  HGB 15.0 15.6  --  14.1  --   HCT 42.7 46.0  --  42.0  --   PLT 216  --   --  151  --   APTT 55*  --   --   --   --   LABPROT 31.9*  --   --  31.5* 25.2*  INR 3.33*  --   --  3.27* 2.42*  CREATININE  --  0.90 0.53 0.46*  --     Estimated Creatinine Clearance: 102.7 ml/min (by C-G formula based on Cr of 0.46).  Assessment: 56yom continuing on Coumadin for hx recent DVT. INR (2.42) is now therapeutic after decreasing with held doses x 2. PTA Coumadin regimen: 7.5mg  daily except 5mg  MWF. With high dose steroids and antibiotics, will restart Coumadin with 5mg  tonight. - No CBC this AM - No significant bleeding reported   Goal of Therapy:  INR 2-3   Plan:  1. Coumadin 5mg  po x 1 today 2. Follow-up AM INR 3. Will plan to check Vancomycin level tomorrow AM if continued  Cleon Dew [phone] 09/02/2012,8:35 AM

## 2012-09-02 NOTE — Progress Notes (Signed)
Pt. Had episode of desats. 80-85 via trach collar 40%., pt. Sleepy but arousable on and off . Suctioned multiple  Times obtained whitish secretions moderate amount. O2 increase to 100%. O2 sats up to 94-96%. Pt. Breathing irregular.from shallow to fast. Will cont. To montor and call the MD.

## 2012-09-02 NOTE — Progress Notes (Signed)
PGY-1 Daily Progress Note Family Medicine Teaching Service [name] MS4 Service Pager: [phone]   Subjective: No pain overnight. When he gets anxious or needs suctioning, he grips the bed rail tightly and his sats appear to go down. When he relaxes, sats above 92%.   Objective:  VITALS Temp:  [96.8 F (36 C)-98.6 F (37 C)] 97.5 F (36.4 C) (03/25 0541) Pulse Rate:  [86-106] 91 (03/25 0541) Resp:  [20-29] 24 (03/25 0541) BP: (142-163)/(84-99) 142/87 mmHg (03/25 0541) SpO2:  [92 %-100 %] 98 % (03/25 0549) FiO2 (%):  [40 %] 40 % (03/25 0549) Weight:  [70.398 kg (155 lb 3.2 oz)] 70.398 kg (155 lb 3.2 oz) (03/25 0541)  In/Out  Intake/Output Summary (Last 24 hours) at 09/02/12 0811 Last data filed at 09/02/12 0600  Gross per 24 hour  Intake   2210 ml  Output   2075 ml  Net    135 ml    Physical Exam: Gen:  NAD HEENT: moist mucous membranes CV: Regular rate and rhythm, no murmurs rubs or gallops PULM: transmitted upper airway sounds throughout. Good air movement.   ABD: soft/nontender/nondistended/normal bowel sounds; PEG site c/d/i EXT:  RLE no longer red or warm. minimal swelling. New dressing c/d/i. Abscess on back still draining, decreased in size, non-tender.  Neuro: Non-verbal. Alert and responsive, able to communicate by writing to request physical therapy.   MEDS Scheduled Meds: . albuterol  2.5 mg Nebulization Q8H  . aspirin  81 mg Per Tube Daily  .  ceFAZolin (ANCEF) IV  2 g Intravenous Q8H  . feeding supplement  30 mL Per Tube BID  . LORazepam  1 mg Intravenous Once  . pantoprazole sodium  40 mg Per Tube Q1200  . predniSONE  70 mg Per Tube Q breakfast  . pyridostigmine  90 mg Per Tube 5 X Daily  . sodium chloride  3 mL Intravenous Q12H  . sodium chloride  3 mL Intravenous Q12H  . sterile water for irrigation  200 mL Irrigation Q4H  . traZODone  100 mg Per Tube QHS  . vancomycin  1,000 mg Intravenous Q12H  . Warfarin - Pharmacist Dosing Inpatient   Does not  apply q1800   Continuous Infusions: . feeding supplement (JEVITY 1.2 CAL) 1,000 mL (09/01/12 2104)   PRN Meds:.sodium chloride, acetaminophen, acetaminophen, ALPRAZolam, HYDROcodone-acetaminophen, ondansetron (ZOFRAN) IV, ondansetron, sodium chloride  Labs and imaging:   CBC  Recent Labs Lab 08/31/12 1113 08/31/12 1252 09/01/12 0912  WBC 12.6*  --  12.5*  HGB 15.0 15.6 14.1  HCT 42.7 46.0 42.0  PLT 216  --  151   BMET/CMET  Recent Labs Lab 08/31/12 1252 08/31/12 1732 09/01/12 0912  NA 135 137 137  K 5.4* 4.3 4.4  CL 103 98 98  CO2  --  27 34*  BUN 24* 17 15  CREATININE 0.90 0.53 0.46*  CALCIUM  --  9.1 9.3  PROT  --  7.1 7.4  BILITOT  --  1.3* 0.8  ALKPHOS  --  71 86  ALT  --  50 44  AST  --  19 15  GLUCOSE 125* 186* 86   Results for orders placed during the hospital encounter of 08/31/12 (from the past 24 hour(s))  COMPREHENSIVE METABOLIC PANEL     Status: Abnormal   Collection Time    09/01/12  9:12 AM      Result Value Range   Sodium 137  135 - 145 mEq/L   Potassium 4.4  3.5 - 5.1 mEq/L   Chloride 98  96 - 112 mEq/L   CO2 34 (*) 19 - 32 mEq/L   Glucose, Bld 86  70 - 99 mg/dL   BUN 15  6 - 23 mg/dL   Creatinine, Ser 1.61 (*) 0.50 - 1.35 mg/dL   Calcium 9.3  8.4 - 09.6 mg/dL   Total Protein 7.4  6.0 - 8.3 g/dL   Albumin 3.2 (*) 3.5 - 5.2 g/dL   AST 15  0 - 37 U/L   ALT 44  0 - 53 U/L   Alkaline Phosphatase 86  39 - 117 U/L   Total Bilirubin 0.8  0.3 - 1.2 mg/dL   GFR calc non Af Amer >90  >90 mL/min   GFR calc Af Amer >90  >90 mL/min  CBC     Status: Abnormal   Collection Time    09/01/12  9:12 AM      Result Value Range   WBC 12.5 (*) 4.0 - 10.5 K/uL   RBC 4.70  4.22 - 5.81 MIL/uL   Hemoglobin 14.1  13.0 - 17.0 g/dL   HCT 04.5  40.9 - 81.1 %   MCV 89.4  78.0 - 100.0 fL   MCH 30.0  26.0 - 34.0 pg   MCHC 33.6  30.0 - 36.0 g/dL   RDW 91.4 (*) 78.2 - 95.6 %   Platelets 151  150 - 400 K/uL  PROTIME-INR     Status: Abnormal   Collection Time     09/01/12  9:12 AM      Result Value Range   Prothrombin Time 31.5 (*) 11.6 - 15.2 seconds   INR 3.27 (*) 0.00 - 1.49  PROTIME-INR     Status: Abnormal   Collection Time    09/02/12  6:06 AM      Result Value Range   Prothrombin Time 25.2 (*) 11.6 - 15.2 seconds   INR 2.42 (*) 0.00 - 1.49   Mr Tibia Fibula Right Wo Contrast  09/01/2012  *RADIOLOGY REPORT*  Clinical Data: Nonhealing soft tissue ulcer in the posterior aspect of the right calf.  MRI OF LOWER RIGHT EXTREMITY WITHOUT CONTRAST  Technique:  Multiplanar, multisequence MR imaging of the lower right extremity was performed. No intravenous contrast was administered.  Comparison: None.  Findings: There is a focal soft tissue ulceration of the posterior aspect of the mid right calf.  The ulceration involves only the subcutaneous fat.  There is a small fluid collection at the inferior lateral aspect of the ulceration which could represent a 13 mm in diameter collection of pus.  No other abscesses.  The underlying muscles are normal.  The tibia and fibula are normal other than minimal degenerative changes at the tibial plafond at the ankle.  There is diffuse slight subcutaneous edema circumferentially in the right lower leg which could represent cellulitis.  IMPRESSION:  1.  Focal subcutaneous fat soft tissue ulceration with a 13 mm pocket of pus at the inferior lateral aspect of the ulceration. 2.  No deep abscesses or myositis or osteomyelitis. 3.  Circumferential soft tissue edema in the right lower leg which could represent cellulitis.   Original Report Authenticated By: Francene Boyers, M.D.    Portable Chest 1 View  09/01/2012  *RADIOLOGY REPORT*  Clinical Data: Shortness of breath  PORTABLE CHEST - 1 VIEW  Comparison: 08/31/2012  Findings: A tracheostomy tube is again seen. The cardiac shadow is stable.  The lungs are well-aerated bilaterally.  Minimal  left basilar atelectasis is seen.  IMPRESSION: No left basilar atelectasis.   Original Report  Authenticated By: Alcide Clever, M.D.    Dg Chest Port 1 View  08/31/2012  *RADIOLOGY REPORT*  Clinical Data: Chest pain  PORTABLE CHEST - 1 VIEW  Comparison: 08/07/2012  Findings: Tracheostomy device stable position.  Patchy subsegmental atelectasis or infiltrates in both lung bases, slightly improved since previous exam.  No effusion.  Heart size normal.  Regional bones unremarkable.  IMPRESSION:  Slight improvement in bibasilar atelectasis or infiltrates.   Original Report Authenticated By: D. Andria Rhein, MD     Assessment/Plan 57 yo M with RLE cellulitis and multiple abscesses  # Cellulitis with abscess now with GPC in blood- RLE: I&D in the ED. Given one dose of vancomycin. Low grade fever and leukocytosis to 12.6. Does not appear septic at admission. Much improved 3/25 with abx.  - Back abscess--Was not drained on admission as not painful or erythematous. Decreased in size 3/25, mild drainage.  - Sacral--wound care team feels these are also abscesses vs. Decubitus ulcers as they are not on bony prominences.   - Started on Vanc and add Zosyn  - Blood culture showed Gram+ cocci in clusters.  MRSA negative.  F/U speciation; Will need ID consult and echo if staph aureus.  - Wound culture grew staph. aureus, awaiting speciation/sensitivities. Continue current therapy.  -given GPC clusters bacteremia, suspicion staph aureus, discontinue zosyn 3/24, continue vanc, add cephazolin for possible MSSA - MRI tib/fib showed cellulitis, with no infiltration to muscle/bone.  # Myasthenia Gravis - Patient recently diagnosed, but well controlled  - Continue prednisone and pyridostigmine as prescribed as an outpatient  - No neurology consult at this time. If breathing worsens, low threshold to consult neuro.  - Monitor O2 sats and work of breathing  # Chronic Trach -  - Trach care with frequent suctioning  - Continue trach collar  - Monitor O2 sats  - Give medications per tube  - Oral care per protocol   - Albuterol prn   # Chronic Peg-  - Nutrition consult for feeds  - PEG care   # H/o DVT on coumadin- INR supertherapeutic at admission  - No calf pain at this time or signs of new DVT  - Warfarin per pharmacy   # Hyperkalemia -  - Repeat afternoon labs and AM labs; resolved 3/24 - Give kayexalate if goes higher than 6   # Tachycardia-  - Monitor on telemetry. Likely secondary to increased work of breathing w/ anxiety. Improved 3/24 w/ ativan given for anxiety.   - Consider beta blocker if hypertensive   # SNF resident  - PT/OT  - CSW for placement. Came from [facility]   FEN/GI - Per nutrition.  Ppx- Heparin Sq   Dispo- Floor status 3/25; Pending improvement and transition to PO antibiotics.  Code: FULL  Contact: Victor Durham, father: [phone]   [name] [facility] 09/02/2012 0815  PGY-2 Addendum: I have seen and examined patient and agree with MS4 note above. Patient doing much better than on admission. His redness/swelling has improved. He appears more alert and comfortable. No complaints this morning, but he is asking to get up with PT and also how long he will have to be here in the hospital  O: Gen: initially on exam he is anxious and holding on to bedrail. When he does this, his O2 sats drop because he is holding his breath. With encouragement, he was able to relax and  O2 sats quickly returned to 100%. HEENT: AT. Temporal wasting. Trach collar in place Abd: PEG in place with feeds running. Dressing C/D/I Skin: RLE without redness or warmth today. Dressing clean. No tenderness of leg. Right upper back lesion draining spontaneously on dressing without tenderness; overall smaller in size  A/P: - Continue IV antibiotics given good response - Await blood cultures to return with final result, then adjust antibiotics as appropriate - Consider ID consult and/or echocardiogram - PT to walk patient - CSW for placement - Continue tube feeds per nutrition  Jiaire Rosebrook M.  Azula Zappia, M.D. 09/02/2012 9:20 AM

## 2012-09-02 NOTE — Progress Notes (Signed)
Family Medicine Teaching Service Attending Note  I interviewed and examined patient Victor Durham and reviewed their tests and x-rays.  I discussed with Dr. Mikel Cella and reviewed their note for today.  I agree with their assessment and plan.     Additionally  Denies problems breathing Leg abscess decreased Appreciate ID recommendations for staph bacteremia

## 2012-09-02 NOTE — Progress Notes (Signed)
  Echocardiogram 2D Echocardiogram has been performed.  Ellender Hose A 09/02/2012, 3:27 PM

## 2012-09-02 NOTE — Progress Notes (Addendum)
ANTIBIOTIC CONSULT NOTE - Follow up  Pharmacy Consult for Vancomycin Indication: MRSA Bacteremia  No Known Allergies  Patient Measurements: Height: 6\' 2"  (188 cm) Weight: 155 lb 3.2 oz (70.398 kg) IBW/kg (Calculated) : 82.2  Vital Signs: Temp: 97.5 F (36.4 C) (03/25 0541) Temp src: Oral (03/25 0541) BP: 142/87 mmHg (03/25 0541) Pulse Rate: 94 (03/25 1231) Intake/Output from previous day: 03/24 0701 - 03/25 0700 In: 2210 [I.V.:70] Out: 2350 [Urine:2350]  Intake/Output from this shift:    Labs:  Recent Labs  08/31/12 1113 08/31/12 1252 08/31/12 1732 09/01/12 0912  WBC 12.6*  --   --  12.5*  HGB 15.0 15.6  --  14.1  PLT 216  --   --  151  CREATININE  --  0.90 0.53 0.46*   Estimated Creatinine Clearance: 102.7 ml/min (by C-G formula based on Cr of 0.46).  Recent Labs  09/02/12 1050  VANCOTROUGH 5.3*     Microbiology: Recent Results (from the past 720 hour(s))  CULTURE, BLOOD (ROUTINE X 2)     Status: None   Collection Time    08/31/12 11:07 AM      Result Value Range Status   Specimen Description BLOOD RIGHT ARM   Final   Special Requests BOTTLES DRAWN AEROBIC AND ANAEROBIC 10CC   Final   Culture  Setup Time 08/31/2012 20:34   Final   Culture     Final   Value: STAPHYLOCOCCUS AUREUS     Note: Gram Stain Report Called to,Read Back By and Verified With: PAULINE COLQUHOUN 09/01/12 0830 BY [email or web address]   Report Status PENDING   Incomplete  CULTURE, BLOOD (ROUTINE X 2)     Status: None   Collection Time    08/31/12 11:14 AM      Result Value Range Status   Specimen Description BLOOD RIGHT HAND   Final   Special Requests BOTTLES DRAWN AEROBIC AND ANAEROBIC [facility]   Final   Culture  Setup Time 08/31/2012 20:34   Final   Culture     Final   Value: STAPHYLOCOCCUS AUREUS     Note: RIFAMPIN AND GENTAMICIN SHOULD NOT BE USED AS SINGLE DRUGS FOR TREATMENT OF STAPH INFECTIONS.     Note: Gram Stain Report Called to,Read Back By and Verified With: PAULINE COLQUHOUN 09/01/12  0830 BY [email or web address]   Report Status PENDING   Incomplete  CULTURE, ROUTINE-ABSCESS     Status: None   Collection Time    08/31/12 12:07 PM      Result Value Range Status   Specimen Description ABSCESS RIGHT LOWER LEG   Final   Special Requests NONE   Final   Gram Stain     Final   Value: RARE WBC PRESENT,BOTH PMN AND MONONUCLEAR     NO SQUAMOUS EPITHELIAL CELLS SEEN     FEW GRAM POSITIVE COCCI     IN PAIRS IN CLUSTERS   Culture     Final   Value: MODERATE STAPHYLOCOCCUS AUREUS     Note: RIFAMPIN AND GENTAMICIN SHOULD NOT BE USED AS SINGLE DRUGS FOR TREATMENT OF STAPH INFECTIONS.   Report Status PENDING   Incomplete  MRSA PCR SCREENING     Status: None   Collection Time    08/31/12  5:22 PM      Result Value Range Status   MRSA by PCR NEGATIVE  NEGATIVE Final   Comment:            The GeneXpert MRSA Assay (FDA     approved  for NASAL specimens     only), is one component of a     comprehensive MRSA colonization     surveillance program. It is not     intended to diagnose MRSA     infection nor to guide or     monitor treatment for     MRSA infections.    Medical History: Past Medical History  Diagnosis Date  . DVT (deep venous thrombosis)   . Hypertension   . High cholesterol   . Myasthenia gravis   . Anxiety   . Dysphagia 06/30/12    Medications:  Prescriptions prior to admission  Medication Sig Dispense Refill  . albuterol (PROVENTIL) (5 MG/ML) 0.5% nebulizer solution Take 2.5 mg by nebulization every 8 (eight) hours. Scheduled dose, trach care      . ALPRAZolam (XANAX) 0.5 MG tablet Give 0.5 mg by tube every 3 (three) hours as needed for anxiety.      Marland Kitchen aspirin 81 MG chewable tablet Give 81 mg by tube daily.      . chlorhexidine (PERIDEX) 0.12 % solution Use as directed 15 mLs in the mouth or throat 6 (six) times daily. Swab mouth at 10am and 10pm Rinse mouth 0600, 1200, 1800, and 0001      . Nutritional Supplements (FIBERSOURCE HN) LIQD Give 70 mL/hr by tube  continuous.      . pantoprazole sodium (PROTONIX) 40 mg/20 mL PACK Place 40 mg into feeding tube daily.      . predniSONE (DELTASONE) 5 MG tablet Give 35 mg by tube daily. 7 tablets      . pyridostigmine (MESTINON) 60 MG/5ML syrup Give 90 mg by tube 5 (five) times daily. 7.53ml      . traZODone (DESYREL) 100 MG tablet Give 100 mg by tube at bedtime.      Marland Kitchen warfarin (COUMADIN) 5 MG tablet Take 5 mg by mouth every Monday, Wednesday, and Friday.      . warfarin (COUMADIN) 7.5 MG tablet Give 7.5 mg by tube every Tuesday, Thursday, Saturday, and Sunday at 6 PM.      . Water For Irrigation, Sterile (STERILE WATER FOR IRRIGATION) Give 200 mLs by tube every 4 (four) hours.       Assessment: Mr. Kepner is a NH patient admitted with shortness of breath and RLE cellulitis. Noted patient is currently afebrile, WBC is elevated at 12.5, but baseline Scr is nml. Noted hx of Myasthenia Gravis, which is likely to cause low muscle mass and therefore low Scr, and therefore [name] is was not considered a good indicator of renal function. Vancomycin 1gm IV q12hr was started for coverage of cellulitis initially. Blood cultures are now positive for MRSA and Vancomycin level is subtherapeutic at 5.3  3/23: MRSA pcr: neg 3/23: Abscess cx: Staph Aureus 3/23: Blood: MRSA   Goal of Therapy:  Vancomycin trough 15-20  Plan:  - Stop Cefazolin - Increase Vancomycin to 1250 mg IV q8h - Will monitor cx/sens, renal fn and clinical status daily - Trough at steady state if MRSA

## 2012-09-02 NOTE — Progress Notes (Signed)
Peripherally Inserted Central Catheter/Midline Placement  The IV Nurse has discussed with the patient and/or persons authorized to consent for the patient, the purpose of this procedure and the potential benefits and risks involved with this procedure.  The benefits include less needle sticks, lab draws from the catheter and patient may be discharged home with the catheter.  Risks include, but not limited to, infection, bleeding, blood clot (thrombus formation), and puncture of an artery; nerve damage and irregular heat beat.  Alternatives to this procedure were also discussed.  Telephone consent obtained by Netta Corrigan, RN from [name], SR father of patient.  PICC/Midline Placement Documentation        Victor Durham, Lajean Manes 09/02/2012, 8:08 PM

## 2012-09-03 ENCOUNTER — Inpatient Hospital Stay (HOSPITAL_COMMUNITY): Payer: Medicaid Other

## 2012-09-03 DIAGNOSIS — J9620 Acute and chronic respiratory failure, unspecified whether with hypoxia or hypercapnia: Secondary | ICD-10-CM | POA: Diagnosis not present

## 2012-09-03 DIAGNOSIS — Z930 Tracheostomy status: Secondary | ICD-10-CM

## 2012-09-03 DIAGNOSIS — A4901 Methicillin susceptible Staphylococcus aureus infection, unspecified site: Secondary | ICD-10-CM

## 2012-09-03 DIAGNOSIS — R7881 Bacteremia: Secondary | ICD-10-CM

## 2012-09-03 DIAGNOSIS — J9600 Acute respiratory failure, unspecified whether with hypoxia or hypercapnia: Secondary | ICD-10-CM

## 2012-09-03 LAB — BLOOD GAS, ARTERIAL
Acid-Base Excess: 10.6 mmol/L — ABNORMAL HIGH (ref 0.0–2.0)
Acid-Base Excess: 9.8 mmol/L — ABNORMAL HIGH (ref 0.0–2.0)
Bicarbonate: 34.8 mEq/L — ABNORMAL HIGH (ref 20.0–24.0)
Drawn by: 331001
FIO2: 0.6 %
FIO2: 0.4 %
MECHVT: 620 mL
O2 Saturation: 95.5 %
PEEP: 5 cmH2O
TCO2: 36.3 mmol/L (ref 0–100)
TCO2: 35.2 mmol/L (ref 0–100)
pCO2 arterial: 47.5 mmHg — ABNORMAL HIGH (ref 35.0–45.0)
pH, Arterial: 7.476 — ABNORMAL HIGH (ref 7.350–7.450)
pO2, Arterial: 49.2 mmHg — ABNORMAL LOW (ref 80.0–100.0)
pO2, Arterial: 67.4 mmHg — ABNORMAL LOW (ref 80.0–100.0)

## 2012-09-03 LAB — CULTURE, ROUTINE-ABSCESS

## 2012-09-03 LAB — CULTURE, BLOOD (ROUTINE X 2)

## 2012-09-03 LAB — BASIC METABOLIC PANEL
BUN: 16 mg/dL (ref 6–23)
CO2: 35 mEq/L — ABNORMAL HIGH (ref 19–32)
Chloride: 96 mEq/L (ref 96–112)
GFR calc Af Amer: >90 mL/min (ref >90)
Potassium: 4.1 mEq/L (ref 3.5–5.1)

## 2012-09-03 LAB — GLUCOSE, CAPILLARY
Glucose-Capillary: 136 mg/dL — ABNORMAL HIGH (ref 70–99)
Glucose-Capillary: 148 mg/dL — ABNORMAL HIGH (ref 70–99)
Glucose-Capillary: 160 mg/dL — ABNORMAL HIGH (ref 70–99)
Glucose-Capillary: 64 mg/dL — ABNORMAL LOW (ref 70–99)

## 2012-09-03 LAB — CBC
HCT: 42.9 % (ref 39.0–52.0)
MCV: 93.9 fL (ref 78.0–100.0)
RBC: 4.57 MIL/uL (ref 4.22–5.81)
RDW: 15.5 % (ref 11.5–15.5)
WBC: 10.2 10*3/uL (ref 4.0–10.5)

## 2012-09-03 LAB — VANCOMYCIN, TROUGH: Vancomycin Tr: 19.1 ug/mL (ref 10.0–20.0)

## 2012-09-03 LAB — AFB CULTURE WITH SMEAR (NOT AT ARMC): Acid Fast Smear: NONE SEEN

## 2012-09-03 LAB — PROTIME-INR: INR: 2.65 — ABNORMAL HIGH (ref 0.00–1.49)

## 2012-09-03 MED ORDER — INSULIN ASPART 100 UNIT/ML ~~LOC~~ SOLN
0.0000 [IU] | SUBCUTANEOUS | Status: DC
  Administered 2012-09-03 (×3): 2 [IU] via SUBCUTANEOUS
  Administered 2012-09-04 (×2): 3 [IU] via SUBCUTANEOUS
  Administered 2012-09-04: 2 [IU] via SUBCUTANEOUS
  Administered 2012-09-04 – 2012-09-05 (×2): 3 [IU] via SUBCUTANEOUS
  Administered 2012-09-06 – 2012-09-07 (×4): 2 [IU] via SUBCUTANEOUS
  Administered 2012-09-07: 3 [IU] via SUBCUTANEOUS
  Administered 2012-09-07: 2 [IU] via SUBCUTANEOUS
  Administered 2012-09-09: 0 [IU] via SUBCUTANEOUS
  Administered 2012-09-09 – 2012-09-10 (×3): 2 [IU] via SUBCUTANEOUS
  Administered 2012-09-11: 3 [IU] via SUBCUTANEOUS
  Administered 2012-09-12 – 2012-09-14 (×3): 2 [IU] via SUBCUTANEOUS

## 2012-09-03 MED ORDER — ALBUTEROL SULFATE HFA 108 (90 BASE) MCG/ACT IN AERS
6.0000 | INHALATION_SPRAY | RESPIRATORY_TRACT | Status: DC | PRN
  Filled 2012-09-03: qty 6.7

## 2012-09-03 MED ORDER — WARFARIN SODIUM 5 MG PO TABS
5.0000 mg | ORAL_TABLET | Freq: Once | ORAL | Status: AC
  Administered 2012-09-03: 5 mg via ORAL
  Filled 2012-09-03: qty 1

## 2012-09-03 MED ORDER — VANCOMYCIN HCL IN DEXTROSE 1-5 GM/200ML-% IV SOLN
1000.0000 mg | Freq: Once | INTRAVENOUS | Status: DC
  Filled 2012-09-03: qty 200

## 2012-09-03 MED ORDER — VANCOMYCIN HCL IN DEXTROSE 1-5 GM/200ML-% IV SOLN
1000.0000 mg | Freq: Three times a day (TID) | INTRAVENOUS | Status: DC
  Filled 2012-09-03 (×2): qty 200

## 2012-09-03 MED ORDER — WHITE PETROLATUM GEL
Status: AC
  Administered 2012-09-03: 17:00:00
  Filled 2012-09-03: qty 5

## 2012-09-03 MED ORDER — FENTANYL CITRATE 0.05 MG/ML IJ SOLN
100.0000 ug | INTRAMUSCULAR | Status: DC | PRN
  Administered 2012-09-03: 100 ug via INTRAVENOUS
  Administered 2012-09-04: 25 ug via INTRAVENOUS
  Administered 2012-09-09 – 2012-09-10 (×2): 100 ug via INTRAVENOUS
  Filled 2012-09-03 (×5): qty 2

## 2012-09-03 MED ORDER — CHLORHEXIDINE GLUCONATE 0.12 % MT SOLN
15.0000 mL | Freq: Two times a day (BID) | OROMUCOSAL | Status: DC
  Administered 2012-09-03 – 2012-09-20 (×29): 15 mL via OROMUCOSAL
  Filled 2012-09-03 (×37): qty 15

## 2012-09-03 MED ORDER — PRO-STAT SUGAR FREE PO LIQD
30.0000 mL | Freq: Two times a day (BID) | ORAL | Status: DC
  Administered 2012-09-03: 30 mL
  Filled 2012-09-03 (×2): qty 30

## 2012-09-03 MED ORDER — JEVITY 1.2 CAL PO LIQD
1000.0000 mL | ORAL | Status: DC
  Filled 2012-09-03 (×3): qty 1000

## 2012-09-03 MED ORDER — BIOTENE DRY MOUTH MT LIQD
15.0000 mL | Freq: Four times a day (QID) | OROMUCOSAL | Status: DC
  Administered 2012-09-03 – 2012-09-20 (×61): 15 mL via OROMUCOSAL

## 2012-09-03 MED ORDER — VANCOMYCIN HCL IN DEXTROSE 1-5 GM/200ML-% IV SOLN
1000.0000 mg | Freq: Three times a day (TID) | INTRAVENOUS | Status: DC
  Administered 2012-09-04 – 2012-09-05 (×5): 1000 mg via INTRAVENOUS
  Filled 2012-09-03 (×6): qty 200

## 2012-09-03 MED ORDER — PRO-STAT SUGAR FREE PO LIQD
30.0000 mL | Freq: Three times a day (TID) | ORAL | Status: DC
  Administered 2012-09-03 – 2012-09-08 (×15): 30 mL
  Filled 2012-09-03 (×17): qty 30

## 2012-09-03 MED ORDER — JEVITY 1.2 CAL PO LIQD
1000.0000 mL | ORAL | Status: AC
  Administered 2012-09-03 – 2012-09-05 (×3): 1000 mL
  Filled 2012-09-03 (×3): qty 1000

## 2012-09-03 NOTE — Progress Notes (Signed)
NUTRITION FOLLOW UP  Intervention:    Jevity 1.2 via PEG at 30 mL/hr and advance by 10 mL every 4 hours to reach goal rate of 50 mL/hr. 30 mL Pro-Stat TID. 200 mL free water every 4 hours. This TF regimen will provide 1740 kcal, 112 grams of protein, and 2168 mL free water.  Nutrition Dx:   Inadequate oral intake related to inability to eat as evidenced by NPO status, Ongoing  Goal:   Intake to meet >/=90% estimated nutrition needs, Met  Monitor:   Vent status, TF re-initiation, weight trends, I/O's, labs  Assessment:   PMH of myasthenia gravis (recently requiring extended hospitalization with PEG and trach), HTN, DVT and HLD who presented from SNF for complaint of SOB. Found to have RLE redness, swelling, pain, and superficial abscess. Bedside I&D performed in ED.  Pt transferred from 5500 to 2100 today due to respiratory failure and unresponsiveness, intubated upon arrival to 2100. Pt with PEG at baseline. Previously on Jevity 1.2 at goal rate of 70 mL/hr and 200 mL free water every 4 hours.  TF now on hold since intubation.  Initiate Jevity 1.2 via PEG at 30 mL/hr and advance by 10 mL every 4 hours to reach goal rate of 50 mL/hr. 30 mL Pro-Stat TID. 200 mL free water every 4 hours. At goal rate, this TF regimen will provide 1740 kcal, 112 grams of protein, and 2168 mL free water. This will provide 95% estimated kcal needs and 100% estimated protein needs.  Height: Ht Readings from Last 1 Encounters:  09/03/12 6' (1.829 m)    Weight Status:   Wt Readings from Last 1 Encounters:  09/03/12 151 lb 14.4 oz (68.9 kg)   Tmax: 37.6; MVe: 8.6 Estimated needs:  Kcal: 1832 Protein: 105-115 grams Fluid: >/=2 L/day  Skin: 6 full thickness wounds: 1 x R shoulder, 4 x sacrum, 1 x RLE  Diet Order: NPO   Intake/Output Summary (Last 24 hours) at 09/03/12 1315 Last data filed at 09/03/12 1000  Gross per 24 hour  Intake   1720 ml  Output    661 ml  Net   1059 ml    Last BM:  3/25   Labs:   Recent Labs Lab 08/31/12 1732 09/01/12 0912 09/03/12 0430  NA 137 137 138  K 4.3 4.4 4.1  CL 98 98 96  CO2 27 34* 35*  BUN 17 15 16   CREATININE 0.53 0.46* 0.44*  CALCIUM 9.1 9.3 9.4  MG 2.1  --   --   PHOS 5.0*  --   --   GLUCOSE 186* 86 69*    CBG (last 3)   Recent Labs  09/03/12 0449 09/03/12 0759 09/03/12 1250  GLUCAP 76 136* 148*    Scheduled Meds: . antiseptic oral rinse  15 mL Mouth Rinse QID  . aspirin  81 mg Per Tube Daily  . chlorhexidine  15 mL Mouth Rinse BID  . feeding supplement  30 mL Per Tube BID  . insulin aspart  0-15 Units Subcutaneous Q4H  . pantoprazole sodium  40 mg Per Tube Q1200  . predniSONE  70 mg Per Tube Q breakfast  . pyridostigmine  90 mg Per Tube 5 X Daily  . sodium chloride  3 mL Intravenous Q12H  . sterile water for irrigation  200 mL Irrigation Q4H  . vancomycin  1,250 mg Intravenous Q8H  . warfarin  5 mg Oral ONCE-1800  . Warfarin - Pharmacist Dosing Inpatient   Does not  apply q1800    Continuous Infusions: . feeding supplement (JEVITY 1.2 CAL) 1,000 mL (09/03/12 0500)    Trenton Gammon Dietetic Intern # [phone]   I agree with student dietitian note; appropriate revisions have been made.  Joaquin Courts, RD, LDN, CNSC Pager# [phone] After Hours Pager# [phone]

## 2012-09-03 NOTE — Consult Note (Signed)
NEURO HOSPITALIST CONSULT NOTE    Reason for Consult: myasthenia  HPI:                                                                                                                                          Victor Durham is an 57 y.o. male with known myesthenia recently hospitalized and received both IVIG and PLEX with no significant response.  At time of discharge he was on 35 mg prednisone daily and 90 mg Mestinon 5 times daily.  Patient returns to hospital due to SOB and RLE redness found to be cellulitis and also diagnosed with bacteremia. Patients continued to have difficulty with his respirations while hospitalized an his prednisone dose was doubled to 70 mg daily on 09/01/12.  Today patient became obtunded and ABG showed sever hypercarbia and acidosis. PCCM was called to assist with ventilation and neurology was called to assist with possible exacerbation of myasthenia.   Past Medical History  Diagnosis Date  . DVT (deep venous thrombosis)   . Hypertension   . High cholesterol   . Myasthenia gravis   . Anxiety   . Dysphagia 06/30/12    Past Surgical History  Procedure Laterality Date  . Esophagogastroduodenoscopy  07/11/2012    Procedure: ESOPHAGOGASTRODUODENOSCOPY (EGD);  Surgeon: Shirley Friar, MD;  Location: [facility];  Service: Endoscopy;  Laterality: N/A;  BEDSIDE  . Tracheostomy N/A     Family History: Unable to obtain  Social History:  reports that he has never smoked. He does not have any smokeless tobacco history on file. He reports that he does not drink alcohol or use illicit drugs.  No Known Allergies  MEDICATIONS:                                                                                                                     Prior to Admission:  Prescriptions prior to admission  Medication Sig Dispense Refill  . albuterol (PROVENTIL) (5 MG/ML) 0.5% nebulizer solution Take 2.5 mg by nebulization every 8 (eight) hours.  Scheduled dose, trach care      . ALPRAZolam (XANAX) 0.5 MG tablet Give 0.5 mg by tube every 3 (three) hours as needed for anxiety.      Marland Kitchen aspirin 81 MG chewable tablet Give 81  mg by tube daily.      . chlorhexidine (PERIDEX) 0.12 % solution Use as directed 15 mLs in the mouth or throat 6 (six) times daily. Swab mouth at 10am and 10pm Rinse mouth 0600, 1200, 1800, and 0001      . Nutritional Supplements (FIBERSOURCE HN) LIQD Give 70 mL/hr by tube continuous.      . pantoprazole sodium (PROTONIX) 40 mg/20 mL PACK Place 40 mg into feeding tube daily.      . predniSONE (DELTASONE) 5 MG tablet Give 35 mg by tube daily. 7 tablets      . pyridostigmine (MESTINON) 60 MG/5ML syrup Give 90 mg by tube 5 (five) times daily. 7.68ml      . traZODone (DESYREL) 100 MG tablet Give 100 mg by tube at bedtime.      Marland Kitchen warfarin (COUMADIN) 5 MG tablet Take 5 mg by mouth every Monday, Wednesday, and Friday.      . warfarin (COUMADIN) 7.5 MG tablet Give 7.5 mg by tube every Tuesday, Thursday, Saturday, and Sunday at 6 PM.      . Water For Irrigation, Sterile (STERILE WATER FOR IRRIGATION) Give 200 mLs by tube every 4 (four) hours.       Scheduled: . antiseptic oral rinse  15 mL Mouth Rinse QID  . aspirin  81 mg Per Tube Daily  . chlorhexidine  15 mL Mouth Rinse BID  . feeding supplement  30 mL Per Tube BID  . insulin aspart  0-15 Units Subcutaneous Q4H  . pantoprazole sodium  40 mg Per Tube Q1200  . predniSONE  70 mg Per Tube Q breakfast  . pyridostigmine  90 mg Per Tube 5 X Daily  . sodium chloride  3 mL Intravenous Q12H  . sterile water for irrigation  200 mL Irrigation Q4H  . vancomycin  1,250 mg Intravenous Q8H  . warfarin  5 mg Oral ONCE-1800  . Warfarin - Pharmacist Dosing Inpatient   Does not apply q1800     ROS:                                                                                                                                       History obtained from unobtainable from patient due to  trach collar.     Blood pressure 90/57, pulse 86, temperature 99.7 F (37.6 C), temperature source Oral, resp. rate 20, height 6' (1.829 m), weight 68.9 kg (151 lb 14.4 oz), SpO2 96.00%.   Neurologic Examination:  Mental Status: Alert, oriented,able to write answers on a piece of paper. Has a trach collar and unable to speak.    Able to follow 3 step commands without difficulty. Cranial Nerves: II: Discs flat bilaterally; Visual fields grossly normal, pupils equal, round, reactive to light and accommodation III,IV, VI: ptosis not present, extra-ocular motions intact bilaterally V,VII: smile symmetric, facial light touch sensation normal bilaterally VIII: hearing normal bilaterally XI: bilateral shoulder shrug XII: midline tongue extension Motor: 4/5 throughout Tone and bulk:normal tone throughout; no atrophy noted Sensory: Pinprick and light touch intact throughout, bilaterally Deep Tendon Reflexes: 2+ and symmetric throughout Plantars: Mute bilaterally Cerebellar: normal finger-to-nose,  CV: pulses palpable throughout    Lab Results  Component Value Date/Time   CHOL 223* 06/27/2009  8:22 PM    Results for orders placed during the hospital encounter of 08/31/12 (from the past 48 hour(s))  PROTIME-INR     Status: Abnormal   Collection Time    09/02/12  6:06 AM      Result Value Range   Prothrombin Time 25.2 (*) 11.6 - 15.2 seconds   INR 2.42 (*) 0.00 - 1.49  VANCOMYCIN, TROUGH     Status: Abnormal   Collection Time    09/02/12 10:50 AM      Result Value Range   Vancomycin Tr 5.3 (*) 10.0 - 20.0 ug/mL  CULTURE, BLOOD (SINGLE)     Status: None   Collection Time    09/02/12  4:48 PM      Result Value Range   Specimen Description BLOOD RIGHT ANTECUBITAL     Special Requests BOTTLES DRAWN AEROBIC ONLY 5CC     Culture  Setup Time 09/02/2012 22:23     Culture       Value:         BLOOD CULTURE RECEIVED NO GROWTH TO DATE CULTURE WILL BE HELD FOR 5 DAYS BEFORE ISSUING A FINAL NEGATIVE REPORT   Report Status PENDING    BLOOD GAS, ARTERIAL     Status: Abnormal   Collection Time    09/02/12 11:37 PM      Result Value Range   FIO2 0.98     Delivery systems TRACH COLLAR/TRACH TUBE     pH, Arterial 7.172 (*) 7.350 - 7.450   Comment: CRITICAL RESULT CALLED TO, READ BACK BY AND VERIFIED WITH:     MERCY SPRAGUE,RN AT 2348,BY TIM SNIDER RRT,RCP ON 09/02/2012   pCO2 arterial 107.0 (*) 35.0 - 45.0 mmHg   Comment: CRITICAL RESULT CALLED TO, READ BACK BY AND VERIFIED WITH:     MERCY SPRAGUE,RN AT 2348,BY TIM SNIDER RRT,RCP ON 09/02/2012   pO2, Arterial 148.0 (*) 80.0 - 100.0 mmHg   Bicarbonate 37.5 (*) 20.0 - 24.0 mEq/L   TCO2 40.7  0 - 100 mmol/L   Acid-Base Excess 9.1 (*) 0.0 - 2.0 mmol/L   O2 Saturation 98.6     Patient temperature 98.6     Collection site LEFT RADIAL     Drawn by [phone]     Sample type ARTERIAL DRAW     Allens test (pass/fail) PASS  PASS  GLUCOSE, CAPILLARY     Status: Abnormal   Collection Time    09/03/12 12:39 AM      Result Value Range   Glucose-Capillary 160 (*) 70 - 99 mg/dL   Comment 1 Notify RN     Comment 2 Documented in Chart    BLOOD GAS, ARTERIAL     Status: Abnormal   Collection  Time    09/03/12  1:25 AM      Result Value Range   FIO2 0.60     Delivery systems VENTILATOR     Mode PRESSURE REGULATED VOLUME CONTROL     VT 620     Rate 20     Peep/cpap 5.0     pH, Arterial 7.476 (*) 7.350 - 7.450   pCO2 arterial 47.5 (*) 35.0 - 45.0 mmHg   pO2, Arterial 49.2 (*) 80.0 - 100.0 mmHg   Bicarbonate 34.8 (*) 20.0 - 24.0 mEq/L   TCO2 36.3  0 - 100 mmol/L   Acid-Base Excess 10.6 (*) 0.0 - 2.0 mmol/L   O2 Saturation 92.3     Patient temperature 97.7     Collection site LEFT RADIAL     Drawn by [phone]     Sample type ARTERIAL     Allens test (pass/fail) PASS  PASS  PROTIME-INR     Status: Abnormal   Collection Time    09/03/12   4:30 AM      Result Value Range   Prothrombin Time 27.0 (*) 11.6 - 15.2 seconds   INR 2.65 (*) 0.00 - 1.49  CBC     Status: None   Collection Time    09/03/12  4:30 AM      Result Value Range   WBC 10.2  4.0 - 10.5 K/uL   RBC 4.57  4.22 - 5.81 MIL/uL   Hemoglobin 13.5  13.0 - 17.0 g/dL   HCT 78.4  69.6 - 29.5 %   MCV 93.9  78.0 - 100.0 fL   MCH 29.5  26.0 - 34.0 pg   MCHC 31.5  30.0 - 36.0 g/dL   RDW 28.4  13.2 - 44.0 %   Platelets 190  150 - 400 K/uL  BASIC METABOLIC PANEL     Status: Abnormal   Collection Time    09/03/12  4:30 AM      Result Value Range   Sodium 138  135 - 145 mEq/L   Potassium 4.1  3.5 - 5.1 mEq/L   Chloride 96  96 - 112 mEq/L   CO2 35 (*) 19 - 32 mEq/L   Glucose, Bld 69 (*) 70 - 99 mg/dL   BUN 16  6 - 23 mg/dL   Creatinine, Ser 1.02 (*) 0.50 - 1.35 mg/dL   Calcium 9.4  8.4 - 72.5 mg/dL   GFR calc non Af Amer >90  >90 mL/min   GFR calc Af Amer >90  >90 mL/min   Comment:            The eGFR has been calculated     using the CKD EPI equation.     This calculation has not been     validated in all clinical     situations.     eGFR's persistently     <90 mL/min signify     possible Chronic Kidney Disease.  GLUCOSE, CAPILLARY     Status: Abnormal   Collection Time    09/03/12  4:34 AM      Result Value Range   Glucose-Capillary 64 (*) 70 - 99 mg/dL   Comment 1 Documented in Chart     Comment 2 Notify RN    GLUCOSE, CAPILLARY     Status: None   Collection Time    09/03/12  4:49 AM      Result Value Range   Glucose-Capillary 76  70 - 99 mg/dL  GLUCOSE, CAPILLARY  Status: Abnormal   Collection Time    09/03/12  7:59 AM      Result Value Range   Glucose-Capillary 136 (*) 70 - 99 mg/dL    Ct Head Wo Contrast  09/03/2012  *RADIOLOGY REPORT*  Clinical Data: Acute mental status changes.  CT HEAD WITHOUT CONTRAST  Technique:  Contiguous axial images were obtained from the base of the skull through the vertex without contrast.  Comparison: MRI of  07/11/2012.  CT 07/10/2012.  Findings: No mass lesion, mass effect, midline shift, hydrocephalus, hemorrhage.  No territorial ischemia or acute infarction.  Bilateral mastoid effusions are present, greater on the left than right, chronic.  Intracranial atherosclerosis is noted.  Scattered areas of low attenuation are present in the periventricular white matter, also demonstrated on prior MRI.  IMPRESSION: No interval change or acute intracranial abnormality.   Original Report Authenticated By: Andreas Newport, M.D.    Mr Tibia Fibula Right Wo Contrast  09/01/2012  *RADIOLOGY REPORT*  Clinical Data: Nonhealing soft tissue ulcer in the posterior aspect of the right calf.  MRI OF LOWER RIGHT EXTREMITY WITHOUT CONTRAST  Technique:  Multiplanar, multisequence MR imaging of the lower right extremity was performed. No intravenous contrast was administered.  Comparison: None.  Findings: There is a focal soft tissue ulceration of the posterior aspect of the mid right calf.  The ulceration involves only the subcutaneous fat.  There is a small fluid collection at the inferior lateral aspect of the ulceration which could represent a 13 mm in diameter collection of pus.  No other abscesses.  The underlying muscles are normal.  The tibia and fibula are normal other than minimal degenerative changes at the tibial plafond at the ankle.  There is diffuse slight subcutaneous edema circumferentially in the right lower leg which could represent cellulitis.  IMPRESSION:  1.  Focal subcutaneous fat soft tissue ulceration with a 13 mm pocket of pus at the inferior lateral aspect of the ulceration. 2.  No deep abscesses or myositis or osteomyelitis. 3.  Circumferential soft tissue edema in the right lower leg which could represent cellulitis.   Original Report Authenticated By: Francene Boyers, M.D.    Dg Chest Port 1 View  09/02/2012  *RADIOLOGY REPORT*  Clinical Data:    Line placement.  PORTABLE CHEST - 1 VIEW  Comparison:  09/01/2012.  Findings: Tracheostomy is unchanged.  Right upper extremity PICC is present with the tip at the cavoatrial junction.  The lung volumes are low.  Pulmonary aeration is worse than on the prior exam, with bilateral basilar atelectasis.  Cardiopericardial silhouette remains enlarged with tortuous thoracic aorta.  IMPRESSION:  1.  New right upper extremity PICC with the tip at the cavoatrial junction. 2.  Low volume chest with bilateral basilar atelectasis.   Original Report Authenticated By: Andreas Newport, M.D.      Assessment/Plan: [name] male with known myasthenia gravis likely suffered worsening of myasthenia secondary to infection and then respiratory distress secondary to sudden increase in prednisone dose. At present time patient has a trach collar and on ventilatory support.   Recommend: 1) Continue with current dose of Mestinon  2) Will continue with 70 mg Prednisone daily as patient currently on ventilatory support.  Will assess daily over next few days and then make adjustments according to his respiratory strength.   Assessment and plan discussed with with attending physician and they are in agreement.    Felicie Morn PA-C [facility] [phone]  09/03/2012, 11:34 AM  Patient  seen and examined together with physician assistant and I concur with the assessment and plan.  Dorian Pod, MD

## 2012-09-03 NOTE — Consult Note (Signed)
PULMONARY  / CRITICAL CARE MEDICINE  Name: Victor Durham MRN: [id number] DOB: 1956-01-18    ADMISSION DATE:  08/31/2012 CONSULTATION DATE:  09/03/2012  REFERRING MD :  FPTS  CHIEF COMPLAINT:  Hypercarbic respiratory failure  BRIEF PATIENT DESCRIPTION: 57 yo with myasthenia gravis and chronic tracheostomy admitted 3/23 with RLE cellulitis and G+ bacteremia.  Became obtunded on 3/26 and ABG reveled profound hypercarbia / acidosis.  Transferred to ICU.  SIGNIFICANT EVENTS / STUDIES:  3/23  Admitted with RLE cellulitis 3/24  RLL MRI >>> Focal subcutaneous fat soft tissue ulceration with a 13 mm pocket of pus at the inferior lateral aspect of the ulceration. No deep abscesses or myositis or osteomyelitis.  Circumferential soft tissue edema in the right lower leg which could represent cellulitis 3/26  Developed acute hypercarbic respiratory failure, transferred to ICU, cuffed tracheostomy tube placed, mechanical ventilation instituted 3/25  2D echo >>>  Poor quality for evaluation of endocarditis 3/26  Head CT >>>  LINES / TUBES: Chronic tracheostomy Chronic PEG R PICC 3/25 >>>  CULTURES: Abscess 3/23 >>> MRSA Blood 3/23 >>> MRSA Blood 3/25 >>> Blood 3/26 >>>  ANTIBIOTICS: Vancomycin 3/23 >>>  INTERVAL HISTORY:  VITAL SIGNS: Temp:  [97.7 F (36.5 C)-99.7 F (37.6 C)] 99.7 F (37.6 C) (03/26 0807) Pulse Rate:  [86-113] 86 (03/26 1000) Resp:  [12-24] 20 (03/26 1000) BP: (77-144)/(46-87) 90/57 mmHg (03/26 1000) SpO2:  [85 %-100 %] 96 % (03/26 1000) FiO2 (%):  [40 %-100 %] 40 % (03/26 1000) Weight:  [68.9 kg (151 lb 14.4 oz)] 68.9 kg (151 lb 14.4 oz) (03/26 0040)  HEMODYNAMICS:   VENTILATOR SETTINGS: Vent Mode:  [-] PRVC FiO2 (%):  [40 %-100 %] 40 % Set Rate:  [20 bmp] 20 bmp Vt Set:  [620 mL] 620 mL PEEP:  [5 cmH20] 5 cmH20 Plateau Pressure:  [17 cmH20-18 cmH20] 17 cmH20  INTAKE / OUTPUT: Intake/Output     03/25 0701 - 03/26 0700 03/26 0701 - 03/27 0700   I.V.  (mL/kg)     Other 940 280   IV Piggyback 250 250   Total Intake(mL/kg) 1190 (17.3) 530 (7.7)   Urine (mL/kg/hr) 510 (0.3) 150 (0.5)   Stool 1 (0)    Total Output 511 150   Net +679 +380         PHYSICAL EXAMINATION: General:  Agonal respiratory effort, assisted ventilation Neuro:  Obtunded, cough / gag diminished HEENT:  PERRL, tracheostomy intact Cardiovascular:  RRR, no m/r/g Lungs:  Bilateral diminished air entry, no w/r/r Abdomen:  Soft, nontender, bowel sounds diminished Musculoskeletal:  Edematous / erythematous RLE Skin:  Sacral decub ulcer  LABS:  Recent Labs Lab 08/31/12 1113  08/31/12 1252 08/31/12 1732 09/01/12 0912 09/02/12 0606 09/02/12 2337 09/03/12 0125 09/03/12 0430  HGB 15.0  --  15.6  --  14.1  --   --   --  13.5  WBC 12.6*  --   --   --  12.5*  --   --   --  10.2  PLT 216  --   --   --  151  --   --   --  190  NA  --   < > 135 137 137  --   --   --  138  K  --   < > 5.4* 4.3 4.4  --   --   --  4.1  CL  --   < > 103 98 98  --   --   --  96  CO2  --   --   --  27 34*  --   --   --  35*  GLUCOSE  --   < > 125* 186* 86  --   --   --  69*  BUN  --   < > 24* 17 15  --   --   --  16  CREATININE  --   < > 0.90 0.53 0.46*  --   --   --  0.44*  CALCIUM  --   --   --  9.1 9.3  --   --   --  9.4  MG  --   --   --  2.1  --   --   --   --   --   PHOS  --   --   --  5.0*  --   --   --   --   --   AST  --   --   --  19 15  --   --   --   --   ALT  --   --   --  50 44  --   --   --   --   ALKPHOS  --   --   --  71 86  --   --   --   --   BILITOT  --   --   --  1.3* 0.8  --   --   --   --   PROT  --   --   --  7.1 7.4  --   --   --   --   ALBUMIN  --   --   --  3.4* 3.2*  --   --   --   --   APTT 55*  --   --   --   --   --   --   --   --   INR 3.33*  --   --   --  3.27* 2.42*  --   --  2.65*  PHART  --   --   --   --   --   --  7.172* 7.476*  --   PCO2ART  --   --   --   --   --   --  107.0* 47.5*  --   PO2ART  --   --   --   --   --   --  148.0* 49.2*  --    < > = values in this interval not displayed.  Recent Labs Lab 09/03/12 0039 09/03/12 0434 09/03/12 0449 09/03/12 0759  GLUCAP 160* 64* 76 136*   CXR:  2/25 >>> Bibasilar athelectasis  ASSESSMENT / PLAN:  PULMONARY A:  Acute on chronic hypercarbic respiratory failure in setting of myasthenic crisis related to acute infection. P:   - Cuffed tracheostomy tube placed and on full support. - Goal SpO2>92, pH>7.30. - Full mechanical support until evaluated by neurology. - Daily SBT and advance to TC if able depending on strength. - Trend ABG / CXR. - Albuterol PRN.  CARDIOVASCULAR A: Suspicion for MRSA endocarditis, nonocclusive TTE. P:  - Goal MAP>60 - TEE pending but will likely not be done until patient is more stable from a neurologic standpoint. - ASA.  RENAL A:  No active issues. P:   - Trend BMP.  GASTROINTESTINAL A:  No active issues. P:   - Begin  TF per nutritionist recommendations. - Protonix for GI Px.  HEMATOLOGIC A:  History of DVT. P:  - Coumadin per pharmacy. - Trend CBC / INR. - DVT Px is not indicated (therapeutic Coumadin).  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 P:   - Cultures and antibiotics as above.  ENDOCRINE  A:  No active issues.  P:   - No intervention required.  NEUROLOGIC A:  MG.  Likely myasthenia crisis exacerbated by acute infection. P:   - Prednisone. - Pyridostigmine. - Neurology consult called. - Fentanyl PRN. - Head CT negative.  TODAY'S SUMMARY:  57 yo with myasthenia gravis and chronic tracheostomy admitted 3/23 with RLE cellulitis and G+ bacteremia.  Became obtunded on 3/26 and ABG reveled profound hypercarbia / acidosis.  Transferred to ICU. Cuffed trach placed - cont vent support.  Head CT r/o septic embolic CVA.  TEE scheduled r/o endocarditis.  On Vancomycin for MRSA bacteremia.  Needs Neurology consult in AM to adjust Rx for MG / Myasthenic crisis.  I have personally obtained a history, examined the  patient, evaluated laboratory and imaging results, formulated the assessment and plan and placed orders.  CRITICAL CARE:  The patient is critically ill with multiple organ systems failure and requires high complexity decision making for assessment and support, frequent evaluation and titration of therapies, application of advanced monitoring technologies and extensive interpretation of multiple databases. Critical Care Time devoted to patient care services described in this note is 35 minutes.   [name], MD Pulmonary and Critical Care Medicine [facility] Pager: [phone]  09/03/2012, 11:15 AM

## 2012-09-03 NOTE — Progress Notes (Signed)
Family Medicine Teaching Service Attending Note  I discussed patient Victor Durham  with Dr. Mikel Cella and reviewed their note for today.  I agree with their assessment and plan.

## 2012-09-03 NOTE — Progress Notes (Signed)
Transferred to ICU accompanied by resp. by bagging.

## 2012-09-03 NOTE — Progress Notes (Signed)
ANTIBIOTIC CONSULT NOTE - FOLLOW UP  Pharmacy Consult for Vancomycin Indication: MRSA bacteremia  No Known Allergies  Patient Measurements: Height: 6' (182.9 cm) Weight: 151 lb 14.4 oz (68.9 kg) IBW/kg (Calculated) : 77.6  Vital Signs: Temp: 99.1 F (37.3 C) (03/26 1530) Temp src: Oral (03/26 0807) BP: 111/77 mmHg (03/26 1400) Pulse Rate: 94 (03/26 1645) Intake/Output from previous day: 03/25 0701 - 03/26 0700 In: 1190 [IV Piggyback:250] Out: 511 [Urine:510; Stool:1] Intake/Output from this shift: Total I/O In: 880 [Other:630; IV Piggyback:250] Out: 501 [Urine:500; Stool:1]  Labs:  Recent Labs  09/01/12 0912 09/03/12 0430  WBC 12.5* 10.2  HGB 14.1 13.5  PLT 151 190  CREATININE 0.46* 0.44*   Estimated Creatinine Clearance: 100.5 ml/min (by C-G formula based on Cr of 0.44).  Recent Labs  09/02/12 1050 09/03/12 1511  VANCOTROUGH 5.3* 19.1     Assessment: Victor Durham continues on Vancomycin for MRSA bacteremia. Noted TEE planned. Vancomycin trough is therapeutic, but at high end of goal after dose adjustment following initial low trough. Noted patient has a normal looking Scr, likely d/t hx of MG, but UOP remains low. Repeated Vanc doses will likely cause a supratherapeutic trough d/t drug accumulation.  Goal of Therapy:  Vancomycin trough level 15-20 mcg/ml  Plan:  - Decrease Vancomycin to 1gm IV q 8h, starting now (RN aware) - Will plan to check Vanc trough at 1730 on 3/27  Thanks, Elfego Giammarino K. Allena Katz, PharmD, BCPS.  Clinical Pharmacist Pager [phone]. 09/03/2012 6:05 PM

## 2012-09-03 NOTE — Progress Notes (Signed)
[facility]  Date of Admission:  08/31/2012  Antibiotics: Vancomycin day 4  Subjective: Now moved to ICu for respiratory distress  Objective: Temp:  [97.7 F (36.5 C)-99.7 F (37.6 C)] 99.7 F (37.6 C) (03/26 0807) Pulse Rate:  [86-113] 86 (03/26 1000) Resp:  [12-24] 20 (03/26 1000) BP: (77-144)/(46-87) 90/57 mmHg (03/26 1000) SpO2:  [85 %-100 %] 96 % (03/26 1000) FiO2 (%):  [40 %-100 %] 40 % (03/26 1000) Weight:  [151 lb 14.4 oz (68.9 kg)] 151 lb 14.4 oz (68.9 kg) (03/26 0040)  General: Arousable but does not respond to questions Skin: no rashes Lungs: diffuse rhonchi, no crackles noted Cor: RRR without m/r/g Abdomen: soft, nt, nd, +bs, no HSM   Lab Results Lab Results  Component Value Date   WBC 10.2 09/03/2012   HGB 13.5 09/03/2012   HCT 42.9 09/03/2012   MCV 93.9 09/03/2012   PLT 190 09/03/2012    Lab Results  Component Value Date   CREATININE 0.44* 09/03/2012   BUN 16 09/03/2012   NA 138 09/03/2012   K 4.1 09/03/2012   CL 96 09/03/2012   CO2 35* 09/03/2012    Lab Results  Component Value Date   ALT 44 09/01/2012   AST 15 09/01/2012   ALKPHOS 86 09/01/2012   BILITOT 0.8 09/01/2012      Microbiology: Recent Results (from the past 240 hour(s))  CULTURE, BLOOD (ROUTINE X 2)     Status: None   Collection Time    08/31/12 11:07 AM      Result Value Range Status   Specimen Description BLOOD RIGHT ARM   Final   Special Requests BOTTLES DRAWN AEROBIC AND ANAEROBIC 10CC   Final   Culture  Setup Time 08/31/2012 20:34   Final   Culture     Final   Value: STAPHYLOCOCCUS AUREUS     Note: SUSCEPTIBILITIES PERFORMED ON PREVIOUS CULTURE WITHIN THE LAST 5 DAYS.     Note: Gram Stain Report Called to,Read Back By and Verified With: PAULINE COLQUHOUN 09/01/12 0830 BY [email or web address]   Report Status 09/03/2012 FINAL   Final  CULTURE, BLOOD (ROUTINE X 2)     Status: None   Collection Time    08/31/12 11:14 AM      Result Value Range Status   Specimen  Description BLOOD RIGHT HAND   Final   Special Requests BOTTLES DRAWN AEROBIC AND ANAEROBIC [facility]   Final   Culture  Setup Time 08/31/2012 20:34   Final   Culture     Final   Value: METHICILLIN RESISTANT STAPHYLOCOCCUS AUREUS     Note: RIFAMPIN AND GENTAMICIN SHOULD NOT BE USED AS SINGLE DRUGS FOR TREATMENT OF STAPH INFECTIONS. CRITICAL RESULT CALLED TO, READ BACK BY AND VERIFIED WITH: LISA JOHNSON @ 1352 09/02/12 BY KRAWS This organism DOES NOT demonstrate inducible Clindamycin      resistance in vitro.     Note: Gram Stain Report Called to,Read Back By and Verified With: PAULINE COLQUHOUN 09/01/12 0830 BY [email or web address]   Report Status 09/03/2012 FINAL   Final   Organism ID, Bacteria METHICILLIN RESISTANT STAPHYLOCOCCUS AUREUS   Final  CULTURE, ROUTINE-ABSCESS     Status: None   Collection Time    08/31/12 12:07 PM      Result Value Range Status   Specimen Description ABSCESS RIGHT LOWER LEG   Final   Special Requests NONE   Final   Gram Stain     Final   Value:  RARE WBC PRESENT,BOTH PMN AND MONONUCLEAR     NO SQUAMOUS EPITHELIAL CELLS SEEN     FEW GRAM POSITIVE COCCI     IN PAIRS IN CLUSTERS   Culture     Final   Value: MODERATE METHICILLIN RESISTANT STAPHYLOCOCCUS AUREUS     Note: RIFAMPIN AND GENTAMICIN SHOULD NOT BE USED AS SINGLE DRUGS FOR TREATMENT OF STAPH INFECTIONS. This organism DOES NOT demonstrate inducible Clindamycin resistance in vitro. CRITICAL RESULT CALLED TO, READ BACK BY AND VERIFIED WITH: GENEVIEVE RN BY      INGRAM A 3/26 840AM   Report Status 09/03/2012 FINAL   Final   Organism ID, Bacteria METHICILLIN RESISTANT STAPHYLOCOCCUS AUREUS   Final  MRSA PCR SCREENING     Status: None   Collection Time    08/31/12  5:22 PM      Result Value Range Status   MRSA by PCR NEGATIVE  NEGATIVE Final   Comment:            The GeneXpert MRSA Assay (FDA     approved for NASAL specimens     only), is one component of a     comprehensive MRSA colonization     surveillance program. It  is not     intended to diagnose MRSA     infection nor to guide or     monitor treatment for     MRSA infections.  CULTURE, BLOOD (SINGLE)     Status: None   Collection Time    09/02/12  4:48 PM      Result Value Range Status   Specimen Description BLOOD RIGHT ANTECUBITAL   Final   Special Requests BOTTLES DRAWN AEROBIC ONLY 5CC   Final   Culture  Setup Time 09/02/2012 22:23   Final   Culture     Final   Value:        BLOOD CULTURE RECEIVED NO GROWTH TO DATE CULTURE WILL BE HELD FOR 5 DAYS BEFORE ISSUING A FINAL NEGATIVE REPORT   Report Status PENDING   Incomplete    Studies/Results: Ct Head Wo Contrast  09/03/2012  *RADIOLOGY REPORT*  Clinical Data: Acute mental status changes.  CT HEAD WITHOUT CONTRAST  Technique:  Contiguous axial images were obtained from the base of the skull through the vertex without contrast.  Comparison: MRI of 07/11/2012.  CT 07/10/2012.  Findings: No mass lesion, mass effect, midline shift, hydrocephalus, hemorrhage.  No territorial ischemia or acute infarction.  Bilateral mastoid effusions are present, greater on the left than right, chronic.  Intracranial atherosclerosis is noted.  Scattered areas of low attenuation are present in the periventricular white matter, also demonstrated on prior MRI.  IMPRESSION: No interval change or acute intracranial abnormality.   Original Report Authenticated By: Andreas Newport, M.D.    Mr Tibia Fibula Right Wo Contrast  09/01/2012  *RADIOLOGY REPORT*  Clinical Data: Nonhealing soft tissue ulcer in the posterior aspect of the right calf.  MRI OF LOWER RIGHT EXTREMITY WITHOUT CONTRAST  Technique:  Multiplanar, multisequence MR imaging of the lower right extremity was performed. No intravenous contrast was administered.  Comparison: None.  Findings: There is a focal soft tissue ulceration of the posterior aspect of the mid right calf.  The ulceration involves only the subcutaneous fat.  There is a small fluid collection at the  inferior lateral aspect of the ulceration which could represent a 13 mm in diameter collection of pus.  No other abscesses.  The underlying muscles are normal.  The  tibia and fibula are normal other than minimal degenerative changes at the tibial plafond at the ankle.  There is diffuse slight subcutaneous edema circumferentially in the right lower leg which could represent cellulitis.  IMPRESSION:  1.  Focal subcutaneous fat soft tissue ulceration with a 13 mm pocket of pus at the inferior lateral aspect of the ulceration. 2.  No deep abscesses or myositis or osteomyelitis. 3.  Circumferential soft tissue edema in the right lower leg which could represent cellulitis.   Original Report Authenticated By: Francene Boyers, M.D.    Dg Chest Port 1 View  09/02/2012  *RADIOLOGY REPORT*  Clinical Data:    Line placement.  PORTABLE CHEST - 1 VIEW  Comparison: 09/01/2012.  Findings: Tracheostomy is unchanged.  Right upper extremity PICC is present with the tip at the cavoatrial junction.  The lung volumes are low.  Pulmonary aeration is worse than on the prior exam, with bilateral basilar atelectasis.  Cardiopericardial silhouette remains enlarged with tortuous thoracic aorta.  IMPRESSION:  1.  New right upper extremity PICC with the tip at the cavoatrial junction. 2.  Low volume chest with bilateral basilar atelectasis.   Original Report Authenticated By: Andreas Newport, M.D.     Assessment/Plan: 1) MRSA bacteremia - off cefazolin.  Source leg abscess.  TTE done but not a good study.  To do TEE.   -blood cultures repeated this am - duration of antibiotics can be decreased if TEE negative to 2 weeks from today if repeat blood cultures remain negative and no other source identified  Staci Righter, MD [facility] [phone] pager   09/03/2012, 10:50 AM

## 2012-09-03 NOTE — Progress Notes (Signed)
Family Medicine Social Note:  After an eventful night, pt now on ventilator support but awake. Shakes his head "no" when I ask him if he has any complaints this morning.  O: Gen: In bed with ventilator attached to trach, awake but not at prior baseline Extremities: RLE with slightly increased erythema around the ankle. I&D site much more indurated with pus. Pt expresses pain while examining this area which is a new finding for him (previously non-tender) Neuro: Possibly decreased tone compared to yesterday and overall more weak  A/P: - Appreciate PCCM excellent care of this patient while in the ICU - Awaiting Neuro evaluation of the patient - Continue Vanc for MRSA bacteremia; ID also consulting - TEE ordered for today - Continue wound care, especially for RLE wound since it is clinically different from yesterday - Continue Coumadin for h/o DVT - Will gladly accept back to FMTS when medically stable for transfer from the ICU  Victor Durham, M.D. 09/03/2012 12:18 PM

## 2012-09-03 NOTE — Progress Notes (Signed)
ANTICOAGULATION CONSULT NOTE - Follow Up Consult  Pharmacy Consult for coumadin Indication: Recent DVT  No Known Allergies  Patient Measurements: Height: 6' (182.9 cm) Weight: 151 lb 14.4 oz (68.9 kg) IBW/kg (Calculated) : 77.6  Vital Signs: Temp: 99.7 F (37.6 C) (03/26 0807) Temp src: Oral (03/26 0807) BP: 105/68 mmHg (03/26 1100) Pulse Rate: 85 (03/26 1225)  Labs:  Recent Labs  08/31/12 1732 09/01/12 0912 09/02/12 0606 09/03/12 0430  HGB  --  14.1  --  13.5  HCT  --  42.0  --  42.9  PLT  --  151  --  190  LABPROT  --  31.5* 25.2* 27.0*  INR  --  3.27* 2.42* 2.65*  CREATININE 0.53 0.46*  --  0.44*    Estimated Creatinine Clearance: 100.5 ml/min (by C-G formula based on Cr of 0.44).   Medications:  Scheduled:  . antiseptic oral rinse  15 mL Mouth Rinse QID  . aspirin  81 mg Per Tube Daily  . chlorhexidine  15 mL Mouth Rinse BID  . feeding supplement  30 mL Per Tube BID  . insulin aspart  0-15 Units Subcutaneous Q4H  . pantoprazole sodium  40 mg Per Tube Q1200  . predniSONE  70 mg Per Tube Q breakfast  . pyridostigmine  90 mg Per Tube 5 X Daily  . sodium chloride  3 mL Intravenous Q12H  . sterile water for irrigation  200 mL Irrigation Q4H  . vancomycin  1,250 mg Intravenous Q8H  . [COMPLETED] warfarin  5 mg Oral ONCE-1800  . warfarin  5 mg Oral ONCE-1800  . Warfarin - Pharmacist Dosing Inpatient   Does not apply q1800  . [DISCONTINUED] albuterol  2.5 mg Nebulization Q8H  . [DISCONTINUED]  ceFAZolin (ANCEF) IV  2 g Intravenous Q8H  . [DISCONTINUED] feeding supplement  30 mL Per Tube BID  . [DISCONTINUED] LORazepam  1 mg Intravenous Once  . [DISCONTINUED] sodium chloride  3 mL Intravenous Q12H  . [DISCONTINUED] traZODone  100 mg Per Tube QHS  . [DISCONTINUED] vancomycin  1,000 mg Intravenous Q12H    Assessment: 57 yo male on Coumadin for hx recent DVT (1/14'). PTA Coumadin regimen: 7.5mg  daily except 5mg  MWF. INR on admission supratherapeutic (3.33).   INR today therapeutic at 2.65 after decrease and held doses x 2. CBC wnl, no reported bleeding.  Goal of Therapy:  INR 2-3 Monitor platelets by anticoagulation protocol: Yes   Plan:  1. Coumadin 5mg  po x 1 today 2. F/U AM INR 3. Monitor for s/sx of bleeding  Bola A. Wandra Feinstein D Clinical Pharmacist Pager:[phone] Phone [phone] 09/03/2012 1:16 PM

## 2012-09-03 NOTE — Progress Notes (Signed)
Transported pt to CT with respiratory. Notified Elink upon leaving and returning to unit.

## 2012-09-03 NOTE — Progress Notes (Signed)
Family Medicine Teaching Service Attending Note  On 3-24 I interviewed and examined patient Victor Durham and reviewed their tests and x-rays.  I discussed with Dr. Durene Cal and reviewed their note for today.  I agree with their assessment and plan.     Additionally  Breathing better Continue Antibiotics treatment

## 2012-09-03 NOTE — Consult Note (Signed)
PULMONARY  / CRITICAL CARE MEDICINE  Name: Victor Durham MRN: [id number] DOB: [birth year] Durham    ADMISSION DATE:  08/31/2012 CONSULTATION DATE:  09/03/2012  REFERRING MD :  FPTS  CHIEF COMPLAINT:  Hypercarbic respiratory failure  BRIEF PATIENT DESCRIPTION: Victor Durham Durham with myasthenia gravis and chronic tracheostomy admitted 3/23 with RLE cellulitis and G+ bacteremia.  Became obtunded on 3/26 and ABG reveled profound hypercarbia / acidosis.  Transferred to ICU.  SIGNIFICANT EVENTS / STUDIES:  3/23  Admitted with RLE cellulitis 3/24  RLL MRI >>> Focal subcutaneous fat soft tissue ulceration with a 13 mm pocket of pus at the inferior lateral aspect of the ulceration. No deep abscesses or myositis or osteomyelitis.  Circumferential soft tissue edema in the right lower leg which could represent cellulitis 3/26  Developed acute hypercarbic respiratory failure, transferred to ICU, cuffed tracheostomy tube placed, mechanical ventilation instituted 3/25  2D echo >>>  Poor quality for evaluation of endocarditis 3/26  Head CT >>>  LINES / TUBES: Chronic tracheostomy Chronic PEG R PICC 3/25 >>>  CULTURES: Abscess 3/23 >>> MRSA Blood 3/23 >>> MRSA Blood 3/25 >>> Blood 3/26 >>>  ANTIBIOTICS: Vancomycin 3/23 >>>  The patient is encephalopathic and unable to provide history, which was obtained for available medical records.  HISTORY OF PRESENT ILLNESS:  Victor Durham with myasthenia gravis and chronic tracheostomy admitted 3/23 with RLE cellulitis and G+ bacteremia.  Became obtunded on 3/26 and ABG reveled profound hypercarbia / acidosis.  Transferred to ICU.  PAST MEDICAL HISTORY :  Past Medical History  Diagnosis Date  . DVT (deep venous thrombosis)   . Hypertension   . High cholesterol   . Myasthenia gravis   . Anxiety   . Dysphagia 06/30/12   Past Surgical History  Procedure Laterality Date  . Esophagogastroduodenoscopy  07/11/2012    Procedure: ESOPHAGOGASTRODUODENOSCOPY (EGD);  Surgeon:  Shirley Friar, MD;  Location: [facility];  Service: Endoscopy;  Laterality: N/A;  BEDSIDE  . Tracheostomy N/A    Prior to Admission medications   Medication Sig Start Date End Date Taking? Authorizing Provider  albuterol (PROVENTIL) (5 MG/ML) 0.5% nebulizer solution Take 2.5 mg by nebulization every 8 (eight) hours. Scheduled dose, trach care   Yes Historical Provider, MD  ALPRAZolam Prudy Feeler) 0.5 MG tablet Give 0.5 mg by tube every 3 (three) hours as needed for anxiety.   Yes Historical Provider, MD  aspirin 81 MG chewable tablet Give 81 mg by tube daily.   Yes Historical Provider, MD  chlorhexidine (PERIDEX) 0.12 % solution Use as directed 15 mLs in the mouth or throat 6 (six) times daily. Swab mouth at 10am and 10pm Rinse mouth 0600, 1200, 1800, and 0001   Yes Historical Provider, MD  Nutritional Supplements (FIBERSOURCE HN) LIQD Give 70 mL/hr by tube continuous.   Yes Historical Provider, MD  pantoprazole sodium (PROTONIX) 40 mg/20 mL PACK Place 40 mg into feeding tube daily.   Yes Historical Provider, MD  predniSONE (DELTASONE) 5 MG tablet Give 35 mg by tube daily. 7 tablets   Yes Historical Provider, MD  pyridostigmine (MESTINON) 60 MG/5ML syrup Give 90 mg by tube 5 (five) times daily. 7.32ml   Yes Historical Provider, MD  traZODone (DESYREL) 100 MG tablet Give 100 mg by tube at bedtime.   Yes Historical Provider, MD  warfarin (COUMADIN) 5 MG tablet Take 5 mg by mouth every Monday, Wednesday, and Friday.   Yes Historical Provider, MD  warfarin (COUMADIN) 7.5 MG tablet Give 7.5 mg by tube  every Tuesday, Thursday, Saturday, and Sunday at 6 PM.   Yes Historical Provider, MD  Water For Irrigation, Sterile (STERILE WATER FOR IRRIGATION) Give 200 mLs by tube every 4 (four) hours.   Yes Historical Provider, MD   No Known Allergies  FAMILY HISTORY:  History reviewed. No pertinent family history.  SOCIAL HISTORY:  reports that he has never smoked. He does not have any smokeless tobacco  history on file. He reports that he does not drink alcohol or use illicit drugs.  REVIEW OF SYSTEMS:  Unable to provide.  INTERVAL HISTORY:  VITAL SIGNS: Temp:  [97.5 F (36.4 C)-97.8 F (36.6 C)] 97.7 F (36.5 C) (03/26 0040) Pulse Rate:  [91-113] 113 (03/26 0100) Resp:  [12-24] 22 (03/26 0100) BP: (109-144)/(78-87) 109/78 mmHg (03/26 0100) SpO2:  [85 %-100 %] 89 % (03/26 0100) FiO2 (%):  [40 %-100 %] 59.6 % (03/26 0100) Weight:  [68.Victor kg (151 lb 14.4 oz)-70.398 kg (155 lb 3.2 oz)] 68.Victor kg (151 lb 14.4 oz) (03/26 0040)  HEMODYNAMICS:   VENTILATOR SETTINGS: Vent Mode:  [-] PRVC FiO2 (%):  [40 %-100 %] 59.6 % Set Rate:  [20 bmp] 20 bmp Vt Set:  [620 mL] 620 mL PEEP:  [5 cmH20] 5 cmH20 Plateau Pressure:  [18 cmH20] 18 cmH20  INTAKE / OUTPUT: Intake/Output     03 /25 0701 - 03/26 0700   Total Intake(mL/kg)    Stool 1   Total Output 1   Net -1         PHYSICAL EXAMINATION: General:  Agonal respiratory effort, assisted ventilation Neuro:  Obtunded, cough / gag diminished HEENT:  PERRL, tracheostomy intact Cardiovascular:  RRR, no m/r/g Lungs:  Bilateral diminished air entry, no w/r/r Abdomen:  Soft, nontender, bowel sounds diminished Musculoskeletal:  Edematous / erythematous RLE Skin:  Sacral decub ulcer  LABS:  Recent Labs Lab 08/31/12 1113  08/31/12 1252 08/31/12 1732 09/01/12 0912 09/02/12 0606 09/02/12 2337  HGB 15.0  --  15.6  --  14.1  --   --   WBC 12.6*  --   --   --  12.5*  --   --   PLT 216  --   --   --  151  --   --   NA  --   --  135 137 137  --   --   K  --   < > 5.4* 4.3 4.4  --   --   CL  --   --  103 98 98  --   --   CO2  --   --   --  27 34*  --   --   GLUCOSE  --   --  125* 186* 86  --   --   BUN  --   --  24* 17 15  --   --   CREATININE  --   --  0.90 0.53 0.46*  --   --   CALCIUM  --   --   --  Victor.1 Victor.3  --   --   MG  --   --   --  2.1  --   --   --   PHOS  --   --   --  5.0*  --   --   --   AST  --   --   --  19 15  --   --   ALT   --   --   --  50 44  --   --   ALKPHOS  --   --   --  71 86  --   --   BILITOT  --   --   --  1.3* 0.8  --   --   PROT  --   --   --  7.1 7.4  --   --   ALBUMIN  --   --   --  3.4* 3.2*  --   --   APTT 55*  --   --   --   --   --   --   INR 3.33*  --   --   --  3.27* 2.42*  --   PHART  --   --   --   --   --   --  7.172*  PCO2ART  --   --   --   --   --   --  107.0*  PO2ART  --   --   --   --   --   --  148.0*  < > = values in this interval not displayed.  Recent Labs Lab 09/03/12 0039  GLUCAP 160*   CXR:  2/25 >>> Bibasilar athelectasis  ASSESSMENT / PLAN:  PULMONARY A:  Acute on chronic hypercarbic respiratory failure in setting of myasthenic crisis related to acute infection. P:   Cuffed tracheostomy tube placed Gaol SpO2>92, pH>7.30 Full mechanical support Daily SBT Trend ABG / CXR Albuterol PRN  CARDIOVASCULAR A: Suspicion for MRSA endocarditis, nonocclusive TTE. P:  Goal MAP>60 TEE pending ASA  RENAL A:  No active issues. P:   Trend BMP  GASTROINTESTINAL A:  No active issues. P:   NPO TF per Nutritionist Protonix for GI Px  HEMATOLOGIC A:  History of DVT. 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 P:   Cultures and antibiotics as above  ENDOCRINE  A:  No active issues.  P:   No intervention required  NEUROLOGIC A:  MG.  Likely myasthenia crisis exacerbated by acute infection. P:   Prednisone Pyridostigmine Consult Neurology in AM to adjust Rx Fentanyl PRN Head CT r/o septic embolic CVA  TODAY'S SUMMARY:  67 Durham with myasthenia gravis and chronic tracheostomy admitted 3/23 with RLE cellulitis and G+ bacteremia.  Became obtunded on 3/26 and ABG reveled profound hypercarbia / acidosis.  Transferred to ICU. Cuffed trach placed - cont vent support.  Head CT r/o septic embolic CVA.  TEE scheduled r/o endocarditis.  On Vancomycin for MRSA bacteremia.   Needs Neurology consult in AM to adjust Rx for MG / Myasthenic crisis.  I have personally obtained a history, examined the patient, evaluated laboratory and imaging results, formulated the assessment and plan and placed orders.  CRITICAL CARE:  The patient is critically ill with multiple organ systems failure and requires high complexity decision making for assessment and support, frequent evaluation and titration of therapies, application of advanced monitoring technologies and extensive interpretation of multiple databases. Critical Care Time devoted to patient care services described in this note is 45 minutes.   Lonia Farber, MD Pulmonary and Critical Care Medicine [facility] Pager: [phone]  09/03/2012, 1:40 AM

## 2012-09-03 NOTE — Progress Notes (Signed)
Hypoglycemic Event  CBG: 64  Treatment: 240cc of orange juice down PEG tube. Restarted tube feedings.  Symptoms: None  Follow-up CBG: Time:0450 CBG Result:75   Possible Reasons for Event: Inadequate meal intake  Comments/MD notified: MD aware. Tube feedings restarted.

## 2012-09-03 NOTE — Progress Notes (Signed)
Clinical Social Worker met with pt at bedside.  Pt presents with difficulty speaking, but engages in conversation.  Pt able to recall this CSW from previous admits.  Pt stated he is ok with returning to [facility] once medically stable.  CSW provided emotional support.  Pt shared with CSW that he was able to walk at [facility], pt appeared to express pride related to this.  CSW to continue to follow and assist as needed.   [name], MSW, [place] [phone]

## 2012-09-03 NOTE — Progress Notes (Signed)
Notified Dr. Darrick Penna of pt's soft blood pressure. No new orders at this time. Will continue to monitor.

## 2012-09-03 NOTE — Progress Notes (Signed)
FMTS interval progress note  Received call from nursing that patient had a change in mental status. Per nursing is now more lethargic than previously and not responding to commands. MD went to evaluate the patient and noted him to be non-responsive to commands. Patient did not respond to sternal rub or pressure on fingers. When MD opened patients eyes he subsequently kept his eyes open for 3-4 seconds and was able to squeeez fingers at that time. After this he went back to non-responsive state. ABG was ordered and revealed pH of 7.17 and pCO2 of 107.  PE:  Gen: non-responsive to verbal or tactile stimuli CV: rrr, no mrg appreciated Pulm: CTAB Ext: no splinter hemorrhages noted, intermittent fasiculations in UE Neuro: intermittently responsive to verbal stimuli, non-responsive to tactile stimuli, GCS 6  A/P: patient is a 57 yo male who presented to the hospital for treatment of cellulitis and abscesses found to have MRSA bacteremia. Patient with acute worsening of mental status this evening and found to be in acute respiratory failure. DDx includes seizure vs stroke (septic emboli given MRSA bacteremia) vs respiratory failure related to myasthenic crisis vs respiratory failure related to xanax. -consult placed to PCCM-patient evaluated by Dr. Herma Carson who agreed with transfer to ICU-appreciate his help in this case -trach replaced and patient being bagged prior to portable vent being delivered to room -to be transferred to ICU unit 2100  Marikay Alar, MD PGY1 FMTS

## 2012-09-04 ENCOUNTER — Inpatient Hospital Stay (HOSPITAL_COMMUNITY): Payer: Medicaid Other

## 2012-09-04 DIAGNOSIS — G7000 Myasthenia gravis without (acute) exacerbation: Secondary | ICD-10-CM

## 2012-09-04 DIAGNOSIS — G709 Myoneural disorder, unspecified: Secondary | ICD-10-CM

## 2012-09-04 LAB — PROTIME-INR
INR: 1.88 — ABNORMAL HIGH (ref 0.00–1.49)
Prothrombin Time: 20.9 seconds — ABNORMAL HIGH (ref 11.6–15.2)

## 2012-09-04 LAB — URINALYSIS, ROUTINE W REFLEX MICROSCOPIC
Nitrite: NEGATIVE
Protein, ur: NEGATIVE mg/dL
Specific Gravity, Urine: 1.018 (ref 1.005–1.030)
Urobilinogen, UA: 0.2 mg/dL (ref 0.0–1.0)

## 2012-09-04 LAB — URINE MICROSCOPIC-ADD ON

## 2012-09-04 LAB — BASIC METABOLIC PANEL
BUN: 17 mg/dL (ref 6–23)
CO2: 34 mEq/L — ABNORMAL HIGH (ref 19–32)
Calcium: 8.7 mg/dL (ref 8.4–10.5)
Calcium: 8.6 mg/dL (ref 8.4–10.5)
Chloride: 96 mEq/L (ref 96–112)
Creatinine, Ser: 0.46 mg/dL — ABNORMAL LOW (ref 0.50–1.35)
GFR calc Af Amer: >90 mL/min (ref >90)
GFR calc non Af Amer: >90 mL/min (ref >90)
Potassium: 3.4 mEq/L — ABNORMAL LOW (ref 3.5–5.1)
Sodium: 137 mEq/L (ref 135–145)

## 2012-09-04 LAB — GLUCOSE, CAPILLARY
Glucose-Capillary: 107 mg/dL — ABNORMAL HIGH (ref 70–99)
Glucose-Capillary: 142 mg/dL — ABNORMAL HIGH (ref 70–99)
Glucose-Capillary: 170 mg/dL — ABNORMAL HIGH (ref 70–99)
Glucose-Capillary: 132 mg/dL — ABNORMAL HIGH (ref 70–99)

## 2012-09-04 LAB — CBC
Hemoglobin: 12.4 g/dL — ABNORMAL LOW (ref 13.0–17.0)
MCH: 30.5 pg (ref 26.0–34.0)
MCHC: 33.1 g/dL (ref 30.0–36.0)
Platelets: 180 10*3/uL (ref 150–400)
RDW: 15.5 % (ref 11.5–15.5)

## 2012-09-04 LAB — PHOSPHORUS: Phosphorus: 0.9 mg/dL — CL (ref 2.3–4.6)

## 2012-09-04 LAB — MAGNESIUM: Magnesium: 2.1 mg/dL (ref 1.5–2.5)

## 2012-09-04 MED ORDER — FUROSEMIDE 10 MG/ML IJ SOLN
40.0000 mg | Freq: Three times a day (TID) | INTRAMUSCULAR | Status: AC
  Administered 2012-09-04 (×2): 40 mg via INTRAVENOUS
  Filled 2012-09-04 (×2): qty 4

## 2012-09-04 MED ORDER — IMMUNE GLOBULIN (HUMAN) 5 GM/100ML IV SOLN
400.0000 mg/kg | INTRAVENOUS | Status: AC
  Administered 2012-09-04 – 2012-09-08 (×5): 25 g via INTRAVENOUS
  Filled 2012-09-04 (×8): qty 100

## 2012-09-04 MED ORDER — POTASSIUM PHOSPHATE DIBASIC 3 MMOLE/ML IV SOLN
30.0000 mmol | Freq: Once | INTRAVENOUS | Status: DC
  Filled 2012-09-04: qty 10

## 2012-09-04 MED ORDER — WARFARIN SODIUM 7.5 MG PO TABS
7.5000 mg | ORAL_TABLET | Freq: Once | ORAL | Status: AC
  Administered 2012-09-04: 7.5 mg via ORAL
  Filled 2012-09-04 (×2): qty 1

## 2012-09-04 MED ORDER — SODIUM GLYCEROPHOSPHATE 1 MMOLE/ML IV SOLN
20.0000 mmol | Freq: Once | INTRAVENOUS | Status: AC
  Administered 2012-09-04: 20 mmol via INTRAVENOUS
  Filled 2012-09-04: qty 20

## 2012-09-04 MED ORDER — POTASSIUM PHOSPHATE DIBASIC 3 MMOLE/ML IV SOLN
30.0000 mmol | Freq: Once | INTRAVENOUS | Status: AC
  Administered 2012-09-04: 30 mmol via INTRAVENOUS
  Filled 2012-09-04: qty 10

## 2012-09-04 MED ORDER — POTASSIUM CHLORIDE 20 MEQ/15ML (10%) PO LIQD
ORAL | Status: AC
  Administered 2012-09-04: 40 meq
  Filled 2012-09-04: qty 30

## 2012-09-04 MED ORDER — POTASSIUM CHLORIDE 20 MEQ/15ML (10%) PO LIQD
40.0000 meq | ORAL | Status: AC
  Filled 2012-09-04 (×2): qty 30

## 2012-09-04 NOTE — Progress Notes (Signed)
Clinical Social Worker met with pt at bedside.  CSW reviewed dispo options.  Pt nodded his head yes to understanding that, similar to his last admission, if pt is unable to wean from vent, he will require a vent SNF with the possibility of a [place] [facility].  If pt is able to wean to [facility] 28%, he will be able to return to [facility].   CSW to continue to follow and assist as needed.    [name], MSW, [place] [phone]

## 2012-09-04 NOTE — Progress Notes (Signed)
[facility]  Date of Admission:  08/31/2012  Antibiotics: Vancomycin day 5  Subjective: No complaints (communicates by noding, thumbs up)  Objective: Temp:  [97.9 F (36.6 C)-99.1 F (37.3 C)] 98.1 F (36.7 C) (03/27 1230) Pulse Rate:  [79-107] 102 (03/27 1200) Resp:  [14-26] 20 (03/27 1200) BP: (91-160)/(48-109) 156/109 mmHg (03/27 1200) SpO2:  [94 %-100 %] 96 % (03/27 1200) FiO2 (%):  [40 %] 40 % (03/27 1156) Weight:  [151 lb 7.3 oz (68.7 kg)] 151 lb 7.3 oz (68.7 kg) (03/27 0500)  General: Awake and alert, responds appropriately to questions Skin: no rashes Lungs: diffuse rhonchi, no crackles noted Cor: RRR without m/r/g Abdomen: soft, nt, nd, +bs, no HSM   Lab Results Lab Results  Component Value Date   WBC 8.4 09/04/2012   HGB 12.4* 09/04/2012   HCT 37.5* 09/04/2012   MCV 92.1 09/04/2012   PLT 180 09/04/2012    Lab Results  Component Value Date   CREATININE 0.49* 09/04/2012   BUN 21 09/04/2012   NA 137 09/04/2012   K 3.4* 09/04/2012   CL 99 09/04/2012   CO2 35* 09/04/2012    Lab Results  Component Value Date   ALT 44 09/01/2012   AST 15 09/01/2012   ALKPHOS 86 09/01/2012   BILITOT 0.8 09/01/2012      Microbiology: Recent Results (from the past 240 hour(s))  CULTURE, BLOOD (ROUTINE X 2)     Status: None   Collection Time    08/31/12 11:07 AM      Result Value Range Status   Specimen Description BLOOD RIGHT ARM   Final   Special Requests BOTTLES DRAWN AEROBIC AND ANAEROBIC 10CC   Final   Culture  Setup Time 08/31/2012 20:34   Final   Culture     Final   Value: STAPHYLOCOCCUS AUREUS     Note: SUSCEPTIBILITIES PERFORMED ON PREVIOUS CULTURE WITHIN THE LAST 5 DAYS.     Note: Gram Stain Report Called to,Read Back By and Verified With: PAULINE COLQUHOUN 09/01/12 0830 BY [email or web address]   Report Status 09/03/2012 FINAL   Final  CULTURE, BLOOD (ROUTINE X 2)     Status: None   Collection Time    08/31/12 11:14 AM      Result Value Range Status     Specimen Description BLOOD RIGHT HAND   Final   Special Requests BOTTLES DRAWN AEROBIC AND ANAEROBIC [facility]   Final   Culture  Setup Time 08/31/2012 20:34   Final   Culture     Final   Value: METHICILLIN RESISTANT STAPHYLOCOCCUS AUREUS     Note: RIFAMPIN AND GENTAMICIN SHOULD NOT BE USED AS SINGLE DRUGS FOR TREATMENT OF STAPH INFECTIONS. CRITICAL RESULT CALLED TO, READ BACK BY AND VERIFIED WITH: LISA JOHNSON @ 1352 09/02/12 BY KRAWS This organism DOES NOT demonstrate inducible Clindamycin      resistance in vitro.     Note: Gram Stain Report Called to,Read Back By and Verified With: PAULINE COLQUHOUN 09/01/12 0830 BY [email or web address]   Report Status 09/03/2012 FINAL   Final   Organism ID, Bacteria METHICILLIN RESISTANT STAPHYLOCOCCUS AUREUS   Final  CULTURE, ROUTINE-ABSCESS     Status: None   Collection Time    08/31/12 12:07 PM      Result Value Range Status   Specimen Description ABSCESS RIGHT LOWER LEG   Final   Special Requests NONE   Final   Gram Stain     Final   Value:  RARE WBC PRESENT,BOTH PMN AND MONONUCLEAR     NO SQUAMOUS EPITHELIAL CELLS SEEN     FEW GRAM POSITIVE COCCI     IN PAIRS IN CLUSTERS   Culture     Final   Value: MODERATE METHICILLIN RESISTANT STAPHYLOCOCCUS AUREUS     Note: RIFAMPIN AND GENTAMICIN SHOULD NOT BE USED AS SINGLE DRUGS FOR TREATMENT OF STAPH INFECTIONS. This organism DOES NOT demonstrate inducible Clindamycin resistance in vitro. CRITICAL RESULT CALLED TO, READ BACK BY AND VERIFIED WITH: GENEVIEVE RN BY      INGRAM A 3/26 840AM   Report Status 09/03/2012 FINAL   Final   Organism ID, Bacteria METHICILLIN RESISTANT STAPHYLOCOCCUS AUREUS   Final  MRSA PCR SCREENING     Status: None   Collection Time    08/31/12  5:22 PM      Result Value Range Status   MRSA by PCR NEGATIVE  NEGATIVE Final   Comment:            The GeneXpert MRSA Assay (FDA     approved for NASAL specimens     only), is one component of a     comprehensive MRSA colonization     surveillance  program. It is not     intended to diagnose MRSA     infection nor to guide or     monitor treatment for     MRSA infections.  CULTURE, BLOOD (SINGLE)     Status: None   Collection Time    09/02/12  4:48 PM      Result Value Range Status   Specimen Description BLOOD RIGHT ANTECUBITAL   Final   Special Requests BOTTLES DRAWN AEROBIC ONLY 5CC   Final   Culture  Setup Time 09/02/2012 22:23   Final   Culture     Final   Value:        BLOOD CULTURE RECEIVED NO GROWTH TO DATE CULTURE WILL BE HELD FOR 5 DAYS BEFORE ISSUING A FINAL NEGATIVE REPORT   Report Status PENDING   Incomplete  CULTURE, BLOOD (ROUTINE X 2)     Status: None   Collection Time    09/03/12  4:30 AM      Result Value Range Status   Specimen Description BLOOD LEFT ARM   Final   Special Requests BOTTLES DRAWN AEROBIC AND ANAEROBIC 10CC    Final   Culture  Setup Time 09/03/2012 08:52   Final   Culture     Final   Value:        BLOOD CULTURE RECEIVED NO GROWTH TO DATE CULTURE WILL BE HELD FOR 5 DAYS BEFORE ISSUING A FINAL NEGATIVE REPORT   Report Status PENDING   Incomplete  CULTURE, BLOOD (ROUTINE X 2)     Status: None   Collection Time    09/03/12  4:40 AM      Result Value Range Status   Specimen Description BLOOD LEFT HAND   Final   Special Requests BOTTLES DRAWN AEROBIC AND ANAEROBIC 10CC   Final   Culture  Setup Time 09/03/2012 08:52   Final   Culture     Final   Value:        BLOOD CULTURE RECEIVED NO GROWTH TO DATE CULTURE WILL BE HELD FOR 5 DAYS BEFORE ISSUING A FINAL NEGATIVE REPORT   Report Status PENDING   Incomplete    Studies/Results: Ct Head Wo Contrast  09/03/2012  *RADIOLOGY REPORT*  Clinical Data: Acute mental status changes.  CT HEAD  WITHOUT CONTRAST  Technique:  Contiguous axial images were obtained from the base of the skull through the vertex without contrast.  Comparison: MRI of 07/11/2012.  CT 07/10/2012.  Findings: No mass lesion, mass effect, midline shift, hydrocephalus, hemorrhage.  No  territorial ischemia or acute infarction.  Bilateral mastoid effusions are present, greater on the left than right, chronic.  Intracranial atherosclerosis is noted.  Scattered areas of low attenuation are present in the periventricular white matter, also demonstrated on prior MRI.  IMPRESSION: No interval change or acute intracranial abnormality.   Original Report Authenticated By: Andreas Newport, M.D.    Dg Chest Port 1 View  09/04/2012  *RADIOLOGY REPORT*  Clinical Data: Respiratory failure.  PORTABLE CHEST - 1 VIEW  Comparison: Chest 09/02/2012 and 09/01/2012.  Findings: Aeration in the right lung base appears somewhat improved.  Left lung is clear.  No pneumothorax is identified. Heart size is normal.  IMPRESSION: Some improvement right basilar aeration.  No other change.   Original Report Authenticated By: Holley Dexter, M.D.    Dg Chest Port 1 View  09/02/2012  *RADIOLOGY REPORT*  Clinical Data:    Line placement.  PORTABLE CHEST - 1 VIEW  Comparison: 09/01/2012.  Findings: Tracheostomy is unchanged.  Right upper extremity PICC is present with the tip at the cavoatrial junction.  The lung volumes are low.  Pulmonary aeration is worse than on the prior exam, with bilateral basilar atelectasis.  Cardiopericardial silhouette remains enlarged with tortuous thoracic aorta.  IMPRESSION:  1.  New right upper extremity PICC with the tip at the cavoatrial junction. 2.  Low volume chest with bilateral basilar atelectasis.   Original Report Authenticated By: Andreas Newport, M.D.     Assessment/Plan: 1) MRSA bacteremia - off cefazolin.  Source leg abscess.  TTE done without any vegetation noted. -TEE if possible but may be difficult in this person -continue vancomycin   Staci Righter, MD [facility] for Infectious Disease [facility] [phone] pager   09/04/2012, 12:53 PM

## 2012-09-04 NOTE — Progress Notes (Signed)
[facility] ADULT ICU REPLACEMENT PROTOCOL FOR AM LAB REPLACEMENT ONLY  The patient does not apply for the [facility] Adult ICU Electrolyte Replacment Protocol based on the criteria listed below:     Is urine output >/= 0.5 ml/kg/hr for the last 6 hours? no Patient's UOP is Unknown, pt incont.  ml/kg/hr    Abnormal electrolyte(s):K3.4   If a panic level lab has been reported, has the CCM MD in charge been notified? yes.   Physician:  Truman Hayward, Charlynn Grimes 09/04/2012 5:30 AM

## 2012-09-04 NOTE — Progress Notes (Signed)
ANTICOAGULATION CONSULT NOTE - Follow Up Consult  Pharmacy Consult for coumadin Indication: Recent DVT  No Known Allergies  Patient Measurements: Height: 6' (182.9 cm) Weight: 151 lb 7.3 oz (68.7 kg) IBW/kg (Calculated) : 77.6  Vital Signs: Temp: 98.8 F (37.1 C) (03/27 0753) Temp src: Oral (03/27 0753) BP: 151/86 mmHg (03/27 0905) Pulse Rate: 88 (03/27 0905)  Labs:  Recent Labs  09/02/12 0606 09/03/12 0430 09/04/12 0425  HGB  --  13.5 12.4*  HCT  --  42.9 37.5*  PLT  --  190 180  LABPROT 25.2* 27.0* 20.9*  INR 2.42* 2.65* 1.88*  CREATININE  --  0.44* 0.49*    Estimated Creatinine Clearance: 100.2 ml/min (by C-G formula based on Cr of 0.49).   Medications:  Scheduled:  . antiseptic oral rinse  15 mL Mouth Rinse QID  . aspirin  81 mg Per Tube Daily  . chlorhexidine  15 mL Mouth Rinse BID  . feeding supplement  30 mL Per Tube TID WC  . furosemide  40 mg Intravenous Q8H  . Immune Globulin 5%  400 mg/kg Intravenous Q24 Hr x 5  . insulin aspart  0-15 Units Subcutaneous Q4H  . pantoprazole sodium  40 mg Per Tube Q1200  . potassium phosphate IVPB (mmol)  30 mmol Intravenous Once  . potassium phosphate IVPB (mmol)  30 mmol Intravenous Once  . predniSONE  70 mg Per Tube Q breakfast  . pyridostigmine  90 mg Per Tube 5 X Daily  . sodium chloride  3 mL Intravenous Q12H  . sterile water for irrigation  200 mL Irrigation Q4H  . vancomycin  1,000 mg Intravenous Once  . vancomycin  1,000 mg Intravenous Q8H  . [COMPLETED] warfarin  5 mg Oral ONCE-1800  . warfarin  7.5 mg Oral ONCE-1800  . Warfarin - Pharmacist Dosing Inpatient   Does not apply q1800  . [COMPLETED] white petrolatum      . [DISCONTINUED] feeding supplement  30 mL Per Tube BID  . [DISCONTINUED] vancomycin  1,250 mg Intravenous Q8H  . [DISCONTINUED] vancomycin  1,000 mg Intravenous Q8H    Assessment: 57 yo male on Coumadin for hx recent DVT (1/14'). PTA Coumadin regimen: 7.5mg  daily except 5mg  MWF. INR on  admission supratherapeutic (3.33).  INR today subtherapeutic at 1.88 ( from 2.65).CBC slight trend down no noted bleeding.  Goal of Therapy:  INR 2-3 Monitor platelets by anticoagulation protocol: Yes   Plan:  1. Coumadin 7.5mg  po x 1 today 2. F/U AM INR 3. Monitor for s/sx of bleeding  Bola A. Wandra Feinstein D Clinical Pharmacist [phone] Phone [phone] 09/04/2012 10:16 AM

## 2012-09-04 NOTE — Progress Notes (Signed)
Attempted pt on ATC on this time per MD order but pt did not tolerate. Pt felt very air hungry and pt became very diaphoretic. Pt then placed back on PS/CPAP and seemed to calm down and WOB decreased. Dr. Molli Knock made aware. RT will continue to monitor.

## 2012-09-04 NOTE — Progress Notes (Signed)
Family Medicine Social Note:  S: No complaints this am.  Denies pain.  O:  General: awake, resting comfortably in bed, appears lethargic. Heart: RRR. No murmurs, rubs, or gallops. Lungs: CTAB.  Extremities: RLE - erythema appreciated around ankle.    A/P: - Appreciate PCCM excellent care of this patient while in the ICU - Neurology recommending continuation of current regimen of Prednisone and Pyridostigmine. - Will continue Vancomycin for MRSA bacteremia.  Patient in need of TEE. - Continue Coumadin for h/o DVT - Will gladly accept back to FMTS when medically stable for transfer from the ICU

## 2012-09-04 NOTE — Progress Notes (Signed)
NEURO HOSPITALIST PROGRESS NOTE   SUBJECTIVE:                                                                                                                        Patient is awake and expresses himself with hand motions.  He tells me he can tell his left eye is more ptosed but uses his hands to tell me this fluctuated at the nursing home also.    OBJECTIVE:                                                                                                                           Vital signs in last 24 hours: Temp:  [97.9 F (36.6 C)-99.1 F (37.3 C)] 98.8 F (37.1 C) (03/27 0753) Pulse Rate:  [79-107] 88 (03/27 0905) Resp:  [14-23] 20 (03/27 0905) BP: (90-160)/(48-98) 151/86 mmHg (03/27 0905) SpO2:  [96 %-100 %] 99 % (03/27 0905) FiO2 (%):  [40 %] 40 % (03/27 0905) Weight:  [68.7 kg (151 lb 7.3 oz)] 68.7 kg (151 lb 7.3 oz) (03/27 0500)  Intake/Output from previous day: 03/26 0701 - 03/27 0700 In: 2970 [I.V.:260; IV Piggyback:960] Out: 1441 [Urine:1440; Stool:1] Intake/Output this shift: Total I/O In: 90 [I.V.:20; Other:70] Out: 15 [Urine:15] Nutritional status: NPO  Past Medical History  Diagnosis Date  . DVT (deep venous thrombosis)   . Hypertension   . High cholesterol   . Myasthenia gravis   . Anxiety   . Dysphagia 06/30/12     Neurologic Exam:  Mental Status: Alert, oriented,follows commands, able to use his hands to communicate.  Cranial Nerves: II: Visual fields grossly normal, pupils equal, round, reactive to light and accommodation III,IV, VI: ptosis present left eye, extra-ocular motions intact bilaterally V,VII: smile symmetric, facial light touch sensation normal bilaterally VIII: hearing normal bilaterally IX,X: gag reflex present XI: bilateral shoulder shrug XII: midline tongue extension Motor: Right : Upper extremity   5/5    Left:     Upper extremity   5/5  Lower extremity   5/5     Lower extremity   5/5 Tone  and bulk:normal tone throughout; no atrophy noted Sensory: Pinprick and light touch intact throughout, bilaterally Deep Tendon Reflexes: 2+ and symmetric throughout UE,  1+ bilateral KJ and no AJ Plantars: Mute bilaterally Cerebellar: normal finger-to-nose,  CV: pulses palpable throughout    Lab Results: Lab Results  Component Value Date/Time   CHOL 223* 06/27/2009  8:22 PM   Lipid Panel No results found for this basename: CHOL, TRIG, HDL, CHOLHDL, VLDL, LDLCALC,  in the last 72 hours  Studies/Results: Ct Head Wo Contrast  09/03/2012  *RADIOLOGY REPORT*  Clinical Data: Acute mental status changes.  CT HEAD WITHOUT CONTRAST  Technique:  Contiguous axial images were obtained from the base of the skull through the vertex without contrast.  Comparison: MRI of 07/11/2012.  CT 07/10/2012.  Findings: No mass lesion, mass effect, midline shift, hydrocephalus, hemorrhage.  No territorial ischemia or acute infarction.  Bilateral mastoid effusions are present, greater on the left than right, chronic.  Intracranial atherosclerosis is noted.  Scattered areas of low attenuation are present in the periventricular white matter, also demonstrated on prior MRI.  IMPRESSION: No interval change or acute intracranial abnormality.   Original Report Authenticated By: Andreas Newport, M.D.    Dg Chest Port 1 View  09/04/2012  *RADIOLOGY REPORT*  Clinical Data: Respiratory failure.  PORTABLE CHEST - 1 VIEW  Comparison: Chest 09/02/2012 and 09/01/2012.  Findings: Aeration in the right lung base appears somewhat improved.  Left lung is clear.  No pneumothorax is identified. Heart size is normal.  IMPRESSION: Some improvement right basilar aeration.  No other change.   Original Report Authenticated By: Holley Dexter, M.D.    Dg Chest Port 1 View  09/02/2012  *RADIOLOGY REPORT*  Clinical Data:    Line placement.  PORTABLE CHEST - 1 VIEW  Comparison: 09/01/2012.  Findings: Tracheostomy is unchanged.  Right upper  extremity PICC is present with the tip at the cavoatrial junction.  The lung volumes are low.  Pulmonary aeration is worse than on the prior exam, with bilateral basilar atelectasis.  Cardiopericardial silhouette remains enlarged with tortuous thoracic aorta.  IMPRESSION:  1.  New right upper extremity PICC with the tip at the cavoatrial junction. 2.  Low volume chest with bilateral basilar atelectasis.   Original Report Authenticated By: Andreas Newport, M.D.     MEDICATIONS                                                                                                                        Scheduled: . antiseptic oral rinse  15 mL Mouth Rinse QID  . aspirin  81 mg Per Tube Daily  . chlorhexidine  15 mL Mouth Rinse BID  . feeding supplement  30 mL Per Tube TID WC  . furosemide  40 mg Intravenous Q8H  . insulin aspart  0-15 Units Subcutaneous Q4H  . pantoprazole sodium  40 mg Per Tube Q1200  . potassium phosphate IVPB (mmol)  30 mmol Intravenous Once  . potassium phosphate IVPB (mmol)  30 mmol Intravenous Once  . predniSONE  70 mg Per Tube Q breakfast  . pyridostigmine  90 mg Per Tube  5 X Daily  . sodium chloride  3 mL Intravenous Q12H  . sterile water for irrigation  200 mL Irrigation Q4H  . vancomycin  1,000 mg Intravenous Once  . vancomycin  1,000 mg Intravenous Q8H  . warfarin  7.5 mg Oral ONCE-1800  . Warfarin - Pharmacist Dosing Inpatient   Does not apply q1800    ASSESSMENT/PLAN:                                                                                                             Myasthenia exacerbation:  Patient has remained on Prednisone 70 mg daily and home dose of Mestinon.  He currently has a rectal tube due to diarrhea but also has recently started tube feeds. Patient has no excessive oral secretions.  He denies any diarrhea prior to hospitalization. He is showing left eye ptosis today and failed weaning of the vent earlier today.   I have discussed case with with  Dr. Leroy Kennedy, due to continued respiratory difficulty and failure to wean despite being on both 70 mg Prednisone and 90 mg Mestinon 5 X  daily would recommend a trial of IVIG.   Will continue to monitor response daily.   Assessment and plan discussed with with attending physician and they are in agreement.    Felicie Morn PA-C [facility] [phone]  09/04/2012, 9:48 AM

## 2012-09-04 NOTE — Progress Notes (Signed)
CRITICAL VALUE ALERT  Critical value received:  Phosphorus 0.9  Date of notification:  09/04/2012  Time of notification:  0515  Critical value read back:yes  Nurse who received alert:  Lamount Cranker  MD notified (1st page):  Dr. Tyson Alias  Time of first page:  0517  MD notified (2nd page):  Time of second page:  Responding MD:  Dr. Tyson Alias  Time MD responded:  [phone]

## 2012-09-04 NOTE — Progress Notes (Signed)
PULMONARY  / CRITICAL CARE MEDICINE  Name: Victor Durham MRN: [id number] DOB: 1955/10/02    ADMISSION DATE:  08/31/2012 CONSULTATION DATE:  09/03/2012  REFERRING MD :  FPTS  CHIEF COMPLAINT:  Hypercarbic respiratory failure  BRIEF PATIENT DESCRIPTION: 57 yo with myasthenia gravis and chronic tracheostomy admitted 3/23 with RLE cellulitis and G+ bacteremia.  Became obtunded on 3/26 and ABG reveled profound hypercarbia / acidosis.  Transferred to ICU.  SIGNIFICANT EVENTS / STUDIES:  3/23  Admitted with RLE cellulitis 3/24  RLL MRI >>> Focal subcutaneous fat soft tissue ulceration with a 13 mm pocket of pus at the inferior lateral aspect of the ulceration. No deep abscesses or myositis or osteomyelitis.  Circumferential soft tissue edema in the right lower leg which could represent cellulitis 3/26  Developed acute hypercarbic respiratory failure, transferred to ICU, cuffed tracheostomy tube placed, mechanical ventilation instituted 3/25  2D echo >>>  Poor quality for evaluation of endocarditis 3/26  Head CT >>>  LINES / TUBES: Chronic tracheostomy Chronic PEG R PICC 3/25 >>>  CULTURES: Abscess 3/23 >>> MRSA Blood 3/23 >>> MRSA Blood 3/25 >>> Blood 3/26 >>>  ANTIBIOTICS: Vancomycin 3/23 >>>  INTERVAL HISTORY:  VITAL SIGNS: Temp:  [97.9 F (36.6 C)-99.1 F (37.3 C)] 98.8 F (37.1 C) (03/27 0753) Pulse Rate:  [79-107] 88 (03/27 0905) Resp:  [14-23] 20 (03/27 0905) BP: (90-160)/(48-98) 151/86 mmHg (03/27 0905) SpO2:  [96 %-100 %] 99 % (03/27 0905) FiO2 (%):  [40 %] 40 % (03/27 0905) Weight:  [68.7 kg (151 lb 7.3 oz)] 68.7 kg (151 lb 7.3 oz) (03/27 0500)  HEMODYNAMICS:   VENTILATOR SETTINGS: Vent Mode:  [-] PSV;CPAP FiO2 (%):  [40 %] 40 % Set Rate:  [20 bmp] 20 bmp Vt Set:  [620 mL] 620 mL PEEP:  [5 cmH20] 5 cmH20 Pressure Support:  [10 cmH20-12 cmH20] 10 cmH20 Plateau Pressure:  [16 cmH20-19 cmH20] 18 cmH20  INTAKE / OUTPUT: Intake/Output     03/26 0701 -  03/27 0700 03/27 0701 - 03/28 0700   I.V. (mL/kg) 260 (3.8) 20 (0.3)   Other 1750 70   IV Piggyback 960    Total Intake(mL/kg) 2970 (43.2) 90 (1.3)   Urine (mL/kg/hr) 1440 (0.9) 15 (0.1)   Stool 1 (0)    Total Output 1441 15   Net +1529 +75         PHYSICAL EXAMINATION: General:  Increase respiratory effort, not tolerating vent weaning Neuro:  Obtunded, cough / gag diminished HEENT:  PERRL, tracheostomy intact Cardiovascular:  RRR, no m/r/g Lungs:  Bilateral diminished air entry, no w/r/r Abdomen:  Soft, nontender, bowel sounds diminished Musculoskeletal:  Edematous / erythematous RLE Skin:  Sacral decub ulcer  LABS:  Recent Labs Lab 08/31/12 1113  08/31/12 1732 09/01/12 0912 09/02/12 0606 09/02/12 2337 09/03/12 0125 09/03/12 0430 09/03/12 1238 09/04/12 0425  HGB 15.0  < >  --  14.1  --   --   --  13.5  --  12.4*  WBC 12.6*  --   --  12.5*  --   --   --  10.2  --  8.4  PLT 216  --   --  151  --   --   --  190  --  180  NA  --   < > 137 137  --   --   --  138  --  137  K  --   < > 4.3 4.4  --   --   --  4.1  --  3.4*  CL  --   < > 98 98  --   --   --  96  --  99  CO2  --   < > 27 34*  --   --   --  35*  --  35*  GLUCOSE  --   < > 186* 86  --   --   --  69*  --  109*  BUN  --   < > 17 15  --   --   --  16  --  21  CREATININE  --   < > 0.53 0.46*  --   --   --  0.44*  --  0.49*  CALCIUM  --   < > 9.1 9.3  --   --   --  9.4  --  8.7  MG  --   --  2.1  --   --   --   --   --   --  2.1  PHOS  --   --  5.0*  --   --   --   --   --   --  0.9*  AST  --   --  19 15  --   --   --   --   --   --   ALT  --   --  50 44  --   --   --   --   --   --   ALKPHOS  --   --  71 86  --   --   --   --   --   --   BILITOT  --   --  1.3* 0.8  --   --   --   --   --   --   PROT  --   --  7.1 7.4  --   --   --   --   --   --   ALBUMIN  --   --  3.4* 3.2*  --   --   --   --   --   --   APTT 55*  --   --   --   --   --   --   --   --   --   INR 3.33*  --   --  3.27* 2.42*  --   --  2.65*  --   1.88*  PHART  --   --   --   --   --  7.172* 7.476*  --  7.479*  --   PCO2ART  --   --   --   --   --  107.0* 47.5*  --  46.3*  --   PO2ART  --   --   --   --   --  148.0* 49.2*  --  67.4*  --   < > = values in this interval not displayed.  Recent Labs Lab 09/03/12 1517 09/03/12 2017 09/03/12 2350 09/04/12 0425 09/04/12 0752  GLUCAP 148* 99 90 107* 142*   CXR:  2/25 >>> Bibasilar athelectasis  ASSESSMENT / PLAN:  PULMONARY A:  Acute on chronic hypercarbic respiratory failure in setting of myasthenic crisis related to acute infection. P:   - Cuffed tracheostomy tube placed and on full support, did not tolerate weaning at all this AM, severe crackles diffusely. - Goal SpO2>92, pH>7.30. - PS trials as tolerated. - Daily  TC trials. - Trend ABG / CXR. - Albuterol PRN.  CARDIOVASCULAR A: Suspicion for MRSA endocarditis, nonocclusive TTE. P:  - Goal MAP>60 - TEE pending but will likely not be done until patient is more stable from a neurologic standpoint.  TTE was insufficient to evaluate for endocarditis. - ASA.  RENAL A:  No active issues.  Fluid overload. P:   - Trend BMP. - Replace K and Phos. - KVO IVF. - Lasix as ordered.  GASTROINTESTINAL A:  No active issues. P:   - Continue TF per nutritionist recommendations. - Protonix for GI Px.  HEMATOLOGIC A:  History of DVT. P:  - Coumadin per pharmacy. - Trend CBC / INR. - DVT Px is not indicated (therapeutic Coumadin).  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 P:   - Cultures and antibiotics as above.  ENDOCRINE  A:  No active issues.  P:   - No intervention required.  NEUROLOGIC A:  MG.  Likely myasthenia crisis exacerbated by acute infection. P:   - Prednisone. - Pyridostigmine. - Neurology consult called. - Fentanyl PRN. - Head CT negative.  TODAY'S SUMMARY:  57 yo with myasthenia gravis and chronic tracheostomy admitted 3/23 with RLE cellulitis and G+ bacteremia.  Became obtunded on 3/26 and  ABG reveled profound hypercarbia / acidosis.  Transferred to ICU. Cuffed trach placed - cont vent support.  Head CT r/o septic embolic CVA.  TEE r/o endocarditis when more stable.  On Vancomycin for MRSA bacteremia.  Neuro consult called on 3/26, awaiting input.  I have personally obtained a history, examined the patient, evaluated laboratory and imaging results, formulated the assessment and plan and placed orders.  CRITICAL CARE:  The patient is critically ill with multiple organ systems failure and requires high complexity decision making for assessment and support, frequent evaluation and titration of therapies, application of advanced monitoring technologies and extensive interpretation of multiple databases. Critical Care Time devoted to patient care services described in this note is 35 minutes.   [name], MD Pulmonary and Critical Care Medicine [facility] Pager: [phone]  09/04/2012, 9:40 AM

## 2012-09-04 NOTE — Progress Notes (Signed)
Family Medicine Teaching Service Attending Note  I discussed patient Victor Durham  with Dr. Adriana Simas and reviewed their note for today.  I agree with their assessment and plan.

## 2012-09-05 DIAGNOSIS — L02419 Cutaneous abscess of limb, unspecified: Principal | ICD-10-CM

## 2012-09-05 DIAGNOSIS — R7881 Bacteremia: Secondary | ICD-10-CM | POA: Diagnosis present

## 2012-09-05 LAB — GLUCOSE, CAPILLARY
Glucose-Capillary: 108 mg/dL — ABNORMAL HIGH (ref 70–99)
Glucose-Capillary: 110 mg/dL — ABNORMAL HIGH (ref 70–99)
Glucose-Capillary: 117 mg/dL — ABNORMAL HIGH (ref 70–99)
Glucose-Capillary: 118 mg/dL — ABNORMAL HIGH (ref 70–99)
Glucose-Capillary: 139 mg/dL — ABNORMAL HIGH (ref 70–99)
Glucose-Capillary: 189 mg/dL — ABNORMAL HIGH (ref 70–99)

## 2012-09-05 LAB — BASIC METABOLIC PANEL
CO2: 34 mEq/L — ABNORMAL HIGH (ref 19–32)
Calcium: 8.8 mg/dL (ref 8.4–10.5)
Creatinine, Ser: 0.56 mg/dL (ref 0.50–1.35)

## 2012-09-05 LAB — CBC
MCH: 29.5 pg (ref 26.0–34.0)
MCV: 89.8 fL (ref 78.0–100.0)
Platelets: 174 10*3/uL (ref 150–400)
RBC: 4.2 MIL/uL — ABNORMAL LOW (ref 4.22–5.81)
RDW: 15.5 % (ref 11.5–15.5)

## 2012-09-05 LAB — MAGNESIUM: Magnesium: 1.9 mg/dL (ref 1.5–2.5)

## 2012-09-05 MED ORDER — FUROSEMIDE 10 MG/ML IJ SOLN
40.0000 mg | Freq: Three times a day (TID) | INTRAMUSCULAR | Status: AC
  Administered 2012-09-05 (×2): 40 mg via INTRAVENOUS
  Filled 2012-09-05 (×2): qty 4

## 2012-09-05 MED ORDER — POTASSIUM CHLORIDE 20 MEQ/15ML (10%) PO LIQD
20.0000 meq | ORAL | Status: AC
  Administered 2012-09-05 (×2): 20 meq
  Filled 2012-09-05 (×3): qty 15

## 2012-09-05 MED ORDER — VANCOMYCIN HCL 1000 MG IV SOLR
750.0000 mg | Freq: Two times a day (BID) | INTRAVENOUS | Status: DC
  Administered 2012-09-06 – 2012-09-07 (×3): 750 mg via INTRAVENOUS
  Filled 2012-09-05 (×4): qty 750

## 2012-09-05 MED ORDER — POTASSIUM CHLORIDE 20 MEQ/15ML (10%) PO LIQD
ORAL | Status: AC
  Administered 2012-09-05: 40 meq
  Filled 2012-09-05: qty 30

## 2012-09-05 MED ORDER — MAGNESIUM SULFATE 40 MG/ML IJ SOLN
2.0000 g | Freq: Once | INTRAMUSCULAR | Status: AC
  Administered 2012-09-05: 2 g via INTRAVENOUS
  Filled 2012-09-05: qty 50

## 2012-09-05 MED ORDER — POTASSIUM PHOSPHATE DIBASIC 3 MMOLE/ML IV SOLN
30.0000 mmol | Freq: Once | INTRAVENOUS | Status: AC
  Administered 2012-09-05: 30 mmol via INTRAVENOUS
  Filled 2012-09-05: qty 10

## 2012-09-05 NOTE — Progress Notes (Signed)
ANTICOAGULATION CONSULT NOTE - Follow Up Consult  Pharmacy Consult for coumadin Indication: Recent DVT  No Known Allergies  Patient Measurements: Height: 6' (182.9 cm) Weight: 149 lb 7.6 oz (67.8 kg) IBW/kg (Calculated) : 77.6  Vital Signs: Temp: 98.2 F (36.8 C) (03/28 0755) Temp src: Oral (03/28 0755) BP: 92/51 mmHg (03/28 0810) Pulse Rate: 94 (03/28 0810)  Labs:  Recent Labs  09/03/12 0430 09/04/12 0425 09/04/12 1854 09/05/12 0441  HGB 13.5 12.4*  --  12.4*  HCT 42.9 37.5*  --  37.7*  PLT 190 180  --  174  LABPROT 27.0* 20.9*  --  21.0*  INR 2.65* 1.88*  --  1.89*  CREATININE 0.44* 0.49* 0.46* 0.56    Assessment: 57 yo male on Coumadin for hx recent DVT (1/14). PTA Coumadin regimen: 7.5mg  daily except 5mg  MWF. INR on admission supratherapeutic (3.33),   INR today subtherapeutic at 1.89, CBC is stable. No bleeding reported.  Goal of Therapy:  INR 2-3 Monitor platelets by anticoagulation protocol: Yes   Plan:  - Repeat Coumadin 7.5mg  po x 1 today - Will f/up daily INR  ANTIBIOTIC CONSULT NOTE - FOLLOW UP  Pharmacy Consult for Vancomycin Indication: MRSA bacteremia  Labs:  Recent Labs  09/03/12 0430 09/04/12 0425 09/04/12 1854 09/05/12 0441  WBC 10.2 8.4  --  7.7  HGB 13.5 12.4*  --  12.4*  PLT 190 180  --  174  CREATININE 0.44* 0.49* 0.46* 0.56   Estimated Creatinine Clearance: 98.9 ml/min (by C-G formula based on Cr of 0.56).  Recent Labs  09/03/12 1511 09/05/12 0941  VANCOTROUGH 19.1 32.3*     Assessment: Mr. Frankie continues on Vancomycin for MRSA bacteremia. Noted TEE planned. Vancomycin trough is supra-therapeutic, despite dose reduction 3/26. Noted patient has a normal looking Scr, likely d/t hx of MG, and UOP is excellent with furosemide. Vancomycin 1gm IV dose this AM was given at 0930, RN verifies the level was drawn prior to hanging the dose.  3/23 vanc>> 3/23 zosyn > 3/24 3/25 VT 5.3 3/24 ancef>>3/25  3/23: MRSA  ZOX:[id number] 3/23: Blood:MRSA 3/23: Wound cx LE abscess: Staph (sens pnding) Blood 3/25 x1 >>> ngtd Blood 3/26 >>> ngtd  Goal of Therapy:  Vancomycin trough level 15-20 mcg/ml  Plan:  - Start Vancomycin 750mg  IV q 12h, start at 1000 tomorrow, 3/29 - Plan to recheck trough on 3/30 at 2130 - Will monitor cx/spec/sens, renal fn and clinical status daily.  Thanks, Lavetta Geier K. Allena Katz, PharmD, BCPS.  Clinical Pharmacist Pager [phone]. 09/05/2012 11:32 AM

## 2012-09-05 NOTE — Progress Notes (Signed)
IVIG started; reviewed reportable signs with pt; Gabe RN is aware of increasing the rate and volume and recording vital signs. Consuello Masse

## 2012-09-05 NOTE — Progress Notes (Signed)
[facility] Nursing ICU Electrolyte Replacement Protocol  Patient Name: Victor Durham DOB: [birth year] MRN: [id number]  Date of Service  09/05/2012 K+ 3.4 Replaced per protocol with per peg. MD notified  HPI/Events of Note    Recent Labs Lab 08/31/12 1732 09/01/12 0912 09/03/12 0430 09/04/12 0425 09/04/12 1854 09/05/12 0441  NA 137 137 138 137 137 136  K 4.3 4.4 4.1 3.4* 3.4* 3.4*  CL 98 98 96 99 96 96  CO2 27 34* 35* 35* 34* 34*  GLUCOSE 186* 86 69* 109* 96 90  BUN 17 15 16 21 17 20   CREATININE 0.53 0.46* 0.44* 0.49* 0.46* 0.56  CALCIUM 9.1 9.3 9.4 8.7 8.6 8.8  MG 2.1  --   --  2.1  --  1.9  PHOS 5.0*  --   --  0.9*  --  2.9    Estimated Creatinine Clearance: 98.9 ml/min (by C-G formula based on Cr of 0.56).  Intake/Output     03/27 0701 - 03/28 0700   I.V. (mL/kg) 839.9 (12.4)   Other 1260   IV Piggyback 1297   Total Intake(mL/kg) 3396.9 (50.1)   Urine (mL/kg/hr) 4335 (2.7)   Total Output 4335   Net -938.1        - I/O DETAILED x24h    Total I/O In: 1238 [I.V.:200; Other:770; IV Piggyback:268] Out: 1325 [Urine:1325] - I/O THIS SHIFT    ASSESSMENT   eICURN Interventions     ASSESSMENT: MAJOR ELECTROLYTE    Merita Norton 09/05/2012, 5:42 AM

## 2012-09-05 NOTE — Progress Notes (Signed)
Family Medicine Teaching Service Attending Note  I discussed patient Victor Durham  with Dr. Hairford and reviewed their note for today.  I agree with their assessment and plan.      

## 2012-09-05 NOTE — Progress Notes (Addendum)
PULMONARY  / CRITICAL CARE MEDICINE  Name: Victor Durham MRN: [id number] DOB: 1955-11-09    ADMISSION DATE:  08/31/2012 CONSULTATION DATE:  09/03/2012  REFERRING MD :  FPTS  CHIEF COMPLAINT:  Hypercarbic respiratory failure  BRIEF PATIENT DESCRIPTION: 57 yo with myasthenia gravis and chronic tracheostomy admitted 3/23 with RLE cellulitis and G+ bacteremia.  Became obtunded on 3/26 and ABG reveled profound hypercarbia / acidosis.  Transferred to ICU.  SIGNIFICANT EVENTS / STUDIES:  3/23  Admitted with RLE cellulitis 3/24  RLL MRI >>> Focal subcutaneous fat soft tissue ulceration with a 13 mm pocket of pus at the inferior lateral aspect of the ulceration. No deep abscesses or myositis or osteomyelitis.  Circumferential soft tissue edema in the right lower leg which could represent cellulitis 3/26  Developed acute hypercarbic respiratory failure, transferred to ICU, cuffed tracheostomy tube placed, mechanical ventilation instituted 3/25  2D echo >>>  Poor quality for evaluation of endocarditis 3/26  Head CT >>>  LINES / TUBES: Chronic tracheostomy Chronic PEG R PICC 3/25 >>>  CULTURES: Abscess 3/23 >>> MRSA Blood 3/23 >>> MRSA Blood 3/25 >>> Blood 3/26 >>>  ANTIBIOTICS: Vancomycin 3/23 >>>  INTERVAL HISTORY:  VITAL SIGNS: Temp:  [98.1 F (36.7 C)-99.3 F (37.4 C)] 98.2 F (36.8 C) (03/28 0755) Pulse Rate:  [81-107] 94 (03/28 0810) Resp:  [13-26] 21 (03/28 0810) BP: (83-163)/(51-109) 92/51 mmHg (03/28 0810) SpO2:  [93 %-100 %] 99 % (03/28 0810) FiO2 (%):  [35 %-40 %] 35 % (03/28 0810) Weight:  [67.8 kg (149 lb 7.6 oz)] 67.8 kg (149 lb 7.6 oz) (03/28 0300)  HEMODYNAMICS:   VENTILATOR SETTINGS: Vent Mode:  [-] PRVC FiO2 (%):  [35 %-40 %] 35 % Set Rate:  [20 bmp] 20 bmp Vt Set:  [620 mL] 620 mL PEEP:  [5 cmH20] 5 cmH20 Pressure Support:  [10 cmH20] 10 cmH20 Plateau Pressure:  [16 cmH20-20 cmH20] 16 cmH20  INTAKE / OUTPUT: Intake/Output     03/27 0701 - 03/28  0700 03/28 0701 - 03/29 0700   I.V. (mL/kg) 859.9 (12.7)    Other 1360    IV Piggyback 1297    Total Intake(mL/kg) 3516.9 (51.9)    Urine (mL/kg/hr) 4485 (2.8)    Stool 100 (0.1)    Total Output 4585     Net -1068.1           PHYSICAL EXAMINATION: General: Alert and interactive. Neuro:  Follows command, cough/gag normalized. HEENT:  PERRL, tracheostomy intact. Cardiovascular:  RRR, no m/r/g. Lungs:  Bilateral diminished air entry, no w/r/r. Abdomen:  Soft, nontender, bowel sounds diminished. Musculoskeletal:  Edematous / erythematous RLE. Skin:  Sacral decub ulcer.  LABS:  Recent Labs Lab 08/31/12 1113  08/31/12 1732 09/01/12 0912  09/02/12 2337 09/03/12 0125 09/03/12 0430 09/03/12 1238 09/04/12 0425 09/04/12 1854 09/05/12 0441  HGB 15.0  < >  --  14.1  --   --   --  13.5  --  12.4*  --  12.4*  WBC 12.6*  --   --  12.5*  --   --   --  10.2  --  8.4  --  7.7  PLT 216  --   --  151  --   --   --  190  --  180  --  174  NA  --   < > 137 137  --   --   --  138  --  137 137 136  K  --   < >  4.3 4.4  --   --   --  4.1  --  3.4* 3.4* 3.4*  CL  --   < > 98 98  --   --   --  96  --  99 96 96  CO2  --   < > 27 34*  --   --   --  35*  --  35* 34* 34*  GLUCOSE  --   < > 186* 86  --   --   --  69*  --  109* 96 90  BUN  --   < > 17 15  --   --   --  16  --  21 17 20   CREATININE  --   < > 0.53 0.46*  --   --   --  0.44*  --  0.49* 0.46* 0.56  CALCIUM  --   < > 9.1 9.3  --   --   --  9.4  --  8.7 8.6 8.8  MG  --   --  2.1  --   --   --   --   --   --  2.1  --  1.9  PHOS  --   --  5.0*  --   --   --   --   --   --  0.9*  --  2.9  AST  --   --  19 15  --   --   --   --   --   --   --   --   ALT  --   --  50 44  --   --   --   --   --   --   --   --   ALKPHOS  --   --  71 86  --   --   --   --   --   --   --   --   BILITOT  --   --  1.3* 0.8  --   --   --   --   --   --   --   --   PROT  --   --  7.1 7.4  --   --   --   --   --   --   --   --   ALBUMIN  --   --  3.4* 3.2*  --   --    --   --   --   --   --   --   APTT 55*  --   --   --   --   --   --   --   --   --   --   --   INR 3.33*  --   --  3.27*  < >  --   --  2.65*  --  1.88*  --  1.89*  PHART  --   --   --   --   --  7.172* 7.476*  --  7.479*  --   --   --   PCO2ART  --   --   --   --   --  107.0* 47.5*  --  46.3*  --   --   --   PO2ART  --   --   --   --   --  148.0* 49.2*  --  67.4*  --   --   --   < > =  values in this interval not displayed.  Recent Labs Lab 09/04/12 1554 09/04/12 1931 09/04/12 2343 09/05/12 0430 09/05/12 0754  GLUCAP 176* 132* 108* 118* 139*   CXR:  2/25 >>> Bibasilar athelectasis  ASSESSMENT / PLAN:  PULMONARY A:  Acute on chronic hypercarbic respiratory failure in setting of myasthenic crisis related to acute infection. P:   - Cuffed tracheostomy tube placed and on full support will reattempt TC again today, if tolerated will attempt 24 hours. - Goal SpO2>92, pH>7.30. - Trend ABG / CXR. - Albuterol PRN.  CARDIOVASCULAR A: Suspicion for MRSA endocarditis, nonocclusive TTE. P:  - Goal MAP>60 - TEE pending but will likely not be done until patient is more stable from a neurologic standpoint.  TTE was insufficient to evaluate for endocarditis. - ASA.  RENAL A:  No active issues.  Fluid overload. P:   - Trend BMP. - Replace K, Mg and Phos. - KVO IVF. - Lasix as ordered.  GASTROINTESTINAL A:  No active issues. P:   - Continue TF per nutritionist recommendations. - Protonix for GI Px.  HEMATOLOGIC A:  History of DVT. P:  - Coumadin per pharmacy. - Trend CBC / INR. - DVT Px is not indicated (therapeutic Coumadin).  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 P:   - Cultures and antibiotics as above.  ENDOCRINE  A:  No active issues.  P:   - No intervention required.  NEUROLOGIC A:  MG.  Likely myasthenia crisis exacerbated by acute infection. P:   - Prednisone. - Pyridostigmine. - Neurology consult appreciated. - IV Ig. - Fentanyl PRN. - Head CT  negative.  TODAY'S SUMMARY:  Will transfer to SDU and back to FPTS, PCCM will sign off, please call back if needed, please insure TEE is performed to R/O endocarditis given TTE results.  I have personally obtained a history, examined the patient, evaluated laboratory and imaging results, formulated the assessment and plan and placed orders.  CRITICAL CARE:  The patient is critically ill with multiple organ systems failure and requires high complexity decision making for assessment and support, frequent evaluation and titration of therapies, application of advanced monitoring technologies and extensive interpretation of multiple databases. Critical Care Time devoted to patient care services described in this note is 35 minutes.   [name], MD Pulmonary and Critical Care Medicine [facility] Pager: [phone]  09/05/2012, 9:23 AM

## 2012-09-05 NOTE — Progress Notes (Signed)
[facility]  Date of Admission:  08/31/2012  Antibiotics: Vancomycin day 6  Subjective: No complaints (communicates by noding, thumbs up)  Objective: Temp:  [98.1 F (36.7 C)-99.3 F (37.4 C)] 98.2 F (36.8 C) (03/28 0755) Pulse Rate:  [81-107] 94 (03/28 0810) Resp:  [13-26] 21 (03/28 0810) BP: (83-163)/(51-109) 92/51 mmHg (03/28 0810) SpO2:  [93 %-100 %] 99 % (03/28 0810) FiO2 (%):  [35 %-40 %] 35 % (03/28 0810) Weight:  [149 lb 7.6 oz (67.8 kg)] 149 lb 7.6 oz (67.8 kg) (03/28 0300)  General: Awake and alert, responds appropriately to questions, now appears at presumed baseline Skin: no rashes Lungs: diffuse rhonchi, no crackles noted Cor: RRR without m/r/g Abdomen: soft, nt, nd, +bs, no HSM   Lab Results Lab Results  Component Value Date   WBC 7.7 09/05/2012   HGB 12.4* 09/05/2012   HCT 37.7* 09/05/2012   MCV 89.8 09/05/2012   PLT 174 09/05/2012    Lab Results  Component Value Date   CREATININE 0.56 09/05/2012   BUN 20 09/05/2012   NA 136 09/05/2012   K 3.4* 09/05/2012   CL 96 09/05/2012   CO2 34* 09/05/2012    Lab Results  Component Value Date   ALT 44 09/01/2012   AST 15 09/01/2012   ALKPHOS 86 09/01/2012   BILITOT 0.8 09/01/2012      Microbiology: Recent Results (from the past 240 hour(s))  CULTURE, BLOOD (ROUTINE X 2)     Status: None   Collection Time    08/31/12 11:07 AM      Result Value Range Status   Specimen Description BLOOD RIGHT ARM   Final   Special Requests BOTTLES DRAWN AEROBIC AND ANAEROBIC 10CC   Final   Culture  Setup Time 08/31/2012 20:34   Final   Culture     Final   Value: STAPHYLOCOCCUS AUREUS     Note: SUSCEPTIBILITIES PERFORMED ON PREVIOUS CULTURE WITHIN THE LAST 5 DAYS.     Note: Gram Stain Report Called to,Read Back By and Verified With: PAULINE COLQUHOUN 09/01/12 0830 BY [email or web address]   Report Status 09/03/2012 FINAL   Final  CULTURE, BLOOD (ROUTINE X 2)     Status: None   Collection Time    08/31/12 11:14  AM      Result Value Range Status   Specimen Description BLOOD RIGHT HAND   Final   Special Requests BOTTLES DRAWN AEROBIC AND ANAEROBIC [facility]   Final   Culture  Setup Time 08/31/2012 20:34   Final   Culture     Final   Value: METHICILLIN RESISTANT STAPHYLOCOCCUS AUREUS     Note: RIFAMPIN AND GENTAMICIN SHOULD NOT BE USED AS SINGLE DRUGS FOR TREATMENT OF STAPH INFECTIONS. CRITICAL RESULT CALLED TO, READ BACK BY AND VERIFIED WITH: LISA JOHNSON @ 1352 09/02/12 BY KRAWS This organism DOES NOT demonstrate inducible Clindamycin      resistance in vitro.     Note: Gram Stain Report Called to,Read Back By and Verified With: PAULINE COLQUHOUN 09/01/12 0830 BY [email or web address]   Report Status 09/03/2012 FINAL   Final   Organism ID, Bacteria METHICILLIN RESISTANT STAPHYLOCOCCUS AUREUS   Final  CULTURE, ROUTINE-ABSCESS     Status: None   Collection Time    08/31/12 12:07 PM      Result Value Range Status   Specimen Description ABSCESS RIGHT LOWER LEG   Final   Special Requests NONE   Final   Gram Stain  Final   Value: RARE WBC PRESENT,BOTH PMN AND MONONUCLEAR     NO SQUAMOUS EPITHELIAL CELLS SEEN     FEW GRAM POSITIVE COCCI     IN PAIRS IN CLUSTERS   Culture     Final   Value: MODERATE METHICILLIN RESISTANT STAPHYLOCOCCUS AUREUS     Note: RIFAMPIN AND GENTAMICIN SHOULD NOT BE USED AS SINGLE DRUGS FOR TREATMENT OF STAPH INFECTIONS. This organism DOES NOT demonstrate inducible Clindamycin resistance in vitro. CRITICAL RESULT CALLED TO, READ BACK BY AND VERIFIED WITH: GENEVIEVE RN BY      INGRAM A 3/26 840AM   Report Status 09/03/2012 FINAL   Final   Organism ID, Bacteria METHICILLIN RESISTANT STAPHYLOCOCCUS AUREUS   Final  MRSA PCR SCREENING     Status: None   Collection Time    08/31/12  5:22 PM      Result Value Range Status   MRSA by PCR NEGATIVE  NEGATIVE Final   Comment:            The GeneXpert MRSA Assay (FDA     approved for NASAL specimens     only), is one component of a     comprehensive  MRSA colonization     surveillance program. It is not     intended to diagnose MRSA     infection nor to guide or     monitor treatment for     MRSA infections.  CULTURE, BLOOD (SINGLE)     Status: None   Collection Time    09/02/12  4:48 PM      Result Value Range Status   Specimen Description BLOOD RIGHT ANTECUBITAL   Final   Special Requests BOTTLES DRAWN AEROBIC ONLY 5CC   Final   Culture  Setup Time 09/02/2012 22:23   Final   Culture     Final   Value:        BLOOD CULTURE RECEIVED NO GROWTH TO DATE CULTURE WILL BE HELD FOR 5 DAYS BEFORE ISSUING A FINAL NEGATIVE REPORT   Report Status PENDING   Incomplete  CULTURE, BLOOD (ROUTINE X 2)     Status: None   Collection Time    09/03/12  4:30 AM      Result Value Range Status   Specimen Description BLOOD LEFT ARM   Final   Special Requests BOTTLES DRAWN AEROBIC AND ANAEROBIC 10CC    Final   Culture  Setup Time 09/03/2012 08:52   Final   Culture     Final   Value:        BLOOD CULTURE RECEIVED NO GROWTH TO DATE CULTURE WILL BE HELD FOR 5 DAYS BEFORE ISSUING A FINAL NEGATIVE REPORT   Report Status PENDING   Incomplete  CULTURE, BLOOD (ROUTINE X 2)     Status: None   Collection Time    09/03/12  4:40 AM      Result Value Range Status   Specimen Description BLOOD LEFT HAND   Final   Special Requests BOTTLES DRAWN AEROBIC AND ANAEROBIC 10CC   Final   Culture  Setup Time 09/03/2012 08:52   Final   Culture     Final   Value:        BLOOD CULTURE RECEIVED NO GROWTH TO DATE CULTURE WILL BE HELD FOR 5 DAYS BEFORE ISSUING A FINAL NEGATIVE REPORT   Report Status PENDING   Incomplete    Studies/Results: Dg Chest Port 1 View  09/04/2012  *RADIOLOGY REPORT*  Clinical Data: Respiratory failure.  PORTABLE CHEST - 1 VIEW  Comparison: Chest 09/02/2012 and 09/01/2012.  Findings: Aeration in the right lung base appears somewhat improved.  Left lung is clear.  No pneumothorax is identified. Heart size is normal.  IMPRESSION: Some improvement right  basilar aeration.  No other change.   Original Report Authenticated By: Holley Dexter, M.D.     Assessment/Plan: 1) MRSA bacteremia -  Source leg abscess.  TTE done without any vegetation noted. -TEE if possible but may be difficult in this person -continue vancomycin, if no TEE done, would treat for 4 weeks through 4/22 - I will be available over the weekend if needed, otherwise will follow up on Monday  Staci Righter, MD [facility] [phone] pager   09/05/2012, 9:24 AM

## 2012-09-05 NOTE — Progress Notes (Signed)
Family Medicine Social Note:  S: Signals with hand motions today, gives me a thumbs up when asked how he is. No complaints or pain.  O:  General: awake, resting comfortably in bed, more alert and overall appears to have more strength Heart: RRR. No murmurs, rubs, or gallops. Lungs: CTAB.  Abd: PEG in place with feeds Extremities: RLE - decreased erythema. Still has induration around I&D site with pus drainage. Neuro: Not formally tested but moving all extremities with increased tone compared to my last exam  A/P: - Appreciate PCCM excellent care of this patient while in the ICU. - Neurology recommending continuation of current regimen of Prednisone and Pyridostigmine. Added on IVIG with first dose yesterday - Now on Trach collar and doing well. - Will continue Vancomycin for MRSA bacteremia.  Patient in need of TEE. (Ordered but not performed) - Continue Coumadin for h/o DVT per pharmacy - Will gladly accept back to [facility]MTS when medically stable for transfer from the ICU at Dr. Percival Spanish discretion  Joice Lofts M. Epic Tribbett, M.D. 09/05/2012 9:43 AM

## 2012-09-05 NOTE — Progress Notes (Signed)
NEURO HOSPITALIST PROGRESS NOTE   SUBJECTIVE:                                                                                                                        Patient shows thumbs up when asked how his breathing is.  He still makes motions his left eye is waxing and waning.  He is having increased oral secretions today and PCCM is diuresing him.   OBJECTIVE:                                                                                                                           Vital signs in last 24 hours: Temp:  [98.1 F (36.7 C)-99.3 F (37.4 C)] 98.2 F (36.8 C) (03/28 0755) Pulse Rate:  [81-107] 94 (03/28 0810) Resp:  [13-25] 21 (03/28 0810) BP: (83-163)/(51-109) 92/51 mmHg (03/28 0810) SpO2:  [93 %-100 %] 99 % (03/28 0810) FiO2 (%):  [35 %-40 %] 35 % (03/28 0810) Weight:  [67.8 kg (149 lb 7.6 oz)] 67.8 kg (149 lb 7.6 oz) (03/28 0300)  Intake/Output from previous day: 03/27 0701 - 03/28 0700 In: 3606.9 [I.V.:879.9; IV Piggyback:1297] Out: 1610 [Urine:4960; Stool:100] Intake/Output this shift: Total I/O In: 1020 [I.V.:60; Other:210; IV Piggyback:750] Out: 1175 [Urine:975; Stool:200] Nutritional status: NPO  Past Medical History  Diagnosis Date  . DVT (deep venous thrombosis)   . Hypertension   . High cholesterol   . Myasthenia gravis   . Anxiety   . Dysphagia 06/30/12     Neurologic Exam:   Mental Status:  Alert, oriented,follows commands, able to use his hands to communicate.  Cranial Nerves:  II: Visual fields grossly normal, pupils equal, round, reactive to light and accommodation  III,IV, VI: ptosis present left eye, extra-ocular motions intact bilaterally  V,VII: smile symmetric, facial light touch sensation normal bilaterally  VIII: hearing normal bilaterally  IX,X: gag reflex present  XI: bilateral shoulder shrug  XII: midline tongue extension  Motor:  Right : Upper extremity 5/5     Left:  Upper extremity  5/5   Lower extremity 5/5      Lower extremity 5/5  Tone and bulk:normal tone throughout; no atrophy noted  Sensory: Pinprick and light touch intact throughout, bilaterally  Deep Tendon Reflexes: 2+ and symmetric throughout UE, 1+ bilateral KJ and no AJ  Plantars:  Mute bilaterally  Cerebellar:  normal finger-to-nose,  CV: pulses palpable throughout   Lab Results: Lab Results  Component Value Date/Time   CHOL 223* 06/27/2009  8:22 PM   Lipid Panel No results found for this basename: CHOL, TRIG, HDL, CHOLHDL, VLDL, LDLCALC,  in the last 72 hours  Studies/Results: Dg Chest Port 1 View  09/04/2012  *RADIOLOGY REPORT*  Clinical Data: Respiratory failure.  PORTABLE CHEST - 1 VIEW  Comparison: Chest 09/02/2012 and 09/01/2012.  Findings: Aeration in the right lung base appears somewhat improved.  Left lung is clear.  No pneumothorax is identified. Heart size is normal.  IMPRESSION: Some improvement right basilar aeration.  No other change.   Original Report Authenticated By: Holley Dexter, M.D.     MEDICATIONS                                                                                                                        Scheduled: . antiseptic oral rinse  15 mL Mouth Rinse QID  . aspirin  81 mg Per Tube Daily  . chlorhexidine  15 mL Mouth Rinse BID  . feeding supplement  30 mL Per Tube TID WC  . furosemide  40 mg Intravenous Q8H  . Immune Globulin 5%  400 mg/kg Intravenous Q24 Hr x 5  . insulin aspart  0-15 Units Subcutaneous Q4H  . magnesium sulfate 1 - 4 g bolus IVPB  2 g Intravenous Once  . pantoprazole sodium  40 mg Per Tube Q1200  . potassium phosphate IVPB (mmol)  30 mmol Intravenous Once  . predniSONE  70 mg Per Tube Q breakfast  . pyridostigmine  90 mg Per Tube 5 X Daily  . sodium chloride  3 mL Intravenous Q12H  . sterile water for irrigation  200 mL Irrigation Q4H  . vancomycin  1,000 mg Intravenous Once  . Warfarin - Pharmacist Dosing Inpatient   Does not apply  q1800    ASSESSMENT/PLAN:                                                                                                            Myasthenia exacerbation:  Patient has remained on Prednisone 70 mg daily and home dose of Mestinon.  He continues to show left eye ptosis and again failed weaning of the vent  today. He is having increased oral secretions today which PCCM is diuresing him for. He has received one dose of IVIG  and second dose scheduled for today.   Will continue to watch patient.  I have discussed with Dr. Montine Circle he does not show any significant improvement on IVIG we may possibly increase steroids but would like to give some time for IVIG to work.   I have discussed case with with Dr. Leroy Kennedy and he is in agreement.    Assessment and plan discussed with with attending physician and they are in agreement.    Felicie Morn PA-C [facility] [phone]  09/05/2012, 11:21 AM

## 2012-09-05 NOTE — Progress Notes (Signed)
Placed pt back on vent due to inc RR Dec SPO2 and diaphoretic.  Pt tolerating well, rt to monitor

## 2012-09-06 LAB — CBC
HCT: 38.4 % — ABNORMAL LOW (ref 39.0–52.0)
RBC: 4.38 MIL/uL (ref 4.22–5.81)
RDW: 15.6 % — ABNORMAL HIGH (ref 11.5–15.5)
WBC: 6.2 10*3/uL (ref 4.0–10.5)

## 2012-09-06 LAB — PROTIME-INR: INR: 2.01 — ABNORMAL HIGH (ref 0.00–1.49)

## 2012-09-06 LAB — GLUCOSE, CAPILLARY
Glucose-Capillary: 125 mg/dL — ABNORMAL HIGH (ref 70–99)
Glucose-Capillary: 128 mg/dL — ABNORMAL HIGH (ref 70–99)

## 2012-09-06 LAB — BASIC METABOLIC PANEL
BUN: 26 mg/dL — ABNORMAL HIGH (ref 6–23)
CO2: 32 mEq/L (ref 19–32)
Chloride: 94 mEq/L — ABNORMAL LOW (ref 96–112)
GFR calc Af Amer: >90 mL/min (ref >90)
Potassium: 3.7 mEq/L (ref 3.5–5.1)

## 2012-09-06 LAB — URINE CULTURE
Colony Count: NO GROWTH
Culture: NO GROWTH

## 2012-09-06 LAB — MAGNESIUM: Magnesium: 2.4 mg/dL (ref 1.5–2.5)

## 2012-09-06 MED ORDER — POTASSIUM PHOSPHATE DIBASIC 3 MMOLE/ML IV SOLN
30.0000 mmol | Freq: Once | INTRAVENOUS | Status: AC
  Administered 2012-09-06: 30 mmol via INTRAVENOUS
  Filled 2012-09-06: qty 10

## 2012-09-06 MED ORDER — JEVITY 1.2 CAL PO LIQD
1000.0000 mL | ORAL | Status: DC
  Administered 2012-09-06 – 2012-09-07 (×3): 1000 mL
  Administered 2012-09-09: 20:00:00
  Administered 2012-09-11 – 2012-09-12 (×3): 1000 mL
  Administered 2012-09-13 – 2012-09-14 (×2)
  Administered 2012-09-15 – 2012-09-17 (×2): 1000 mL
  Administered 2012-09-17: 22:00:00
  Administered 2012-09-18 – 2012-09-20 (×3): 1000 mL
  Filled 2012-09-06 (×31): qty 1000

## 2012-09-06 MED ORDER — FUROSEMIDE 10 MG/ML IJ SOLN
40.0000 mg | Freq: Once | INTRAMUSCULAR | Status: AC
  Administered 2012-09-06: 40 mg via INTRAVENOUS
  Filled 2012-09-06: qty 4

## 2012-09-06 MED ORDER — WARFARIN SODIUM 7.5 MG PO TABS
7.5000 mg | ORAL_TABLET | Freq: Once | ORAL | Status: AC
  Administered 2012-09-06: 7.5 mg
  Filled 2012-09-06: qty 1

## 2012-09-06 MED ORDER — WHITE PETROLATUM GEL
Status: AC
  Administered 2012-09-06: 14:00:00
  Filled 2012-09-06: qty 5

## 2012-09-06 NOTE — Progress Notes (Signed)
Family Medicine Teaching Service Attending Note  I discussed patient Victor Durham  with Dr. Durene Cal and reviewed their note for today.  I agree with their assessment and plan.

## 2012-09-06 NOTE — Progress Notes (Signed)
PULMONARY  / CRITICAL CARE MEDICINE  Name: Victor Durham MRN: [id number] DOB: [birth year]    ADMISSION DATE:  08/31/2012 CONSULTATION DATE:  09/03/2012  REFERRING MD :  FPTS  CHIEF COMPLAINT:  Hypercarbic respiratory failure  BRIEF PATIENT DESCRIPTION: 57 yo with myasthenia gravis and chronic tracheostomy admitted 3/23 with RLE cellulitis and G+ bacteremia.  Became obtunded on 3/26 and ABG reveled profound hypercarbia / acidosis.  Transferred to ICU.  SIGNIFICANT EVENTS / STUDIES:  3/23  Admitted with RLE cellulitis 3/24  RLL MRI >>> Focal subcutaneous fat soft tissue ulceration with a 13 mm pocket of pus at the inferior lateral aspect of the ulceration. No deep abscesses or myositis or osteomyelitis.  Circumferential soft tissue edema in the right lower leg which could represent cellulitis 3/26  Developed acute hypercarbic respiratory failure, transferred to ICU, cuffed tracheostomy tube placed, mechanical ventilation instituted 3/25  2D echo >>>  Poor quality for evaluation of endocarditis 3/26  Head CT >>>Neg 3/26 Start IVIG  LINES / TUBES: Chronic tracheostomy Chronic PEG R PICC 3/25 >>>  CULTURES: Abscess 3/23 >>> MRSA Blood 3/23 >>> MRSA Blood 3/25 >>> Blood 3/26 >>>  ANTIBIOTICS: Vancomycin 3/23 >>>  INTERVAL HISTORY:  VITAL SIGNS: Temp:  [98.1 F (36.7 C)-99 F (37.2 C)] 99 F (37.2 C) (03/28 2349) Pulse Rate:  [75-109] 75 (03/29 0700) Resp:  [19-25] 20 (03/29 0700) BP: (81-137)/(51-85) 81/57 mmHg (03/29 0700) SpO2:  [92 %-99 %] 99 % (03/29 0700) FiO2 (%):  [35 %-40 %] 40 % (03/29 0600) Weight:  [68.1 kg (150 lb 2.1 oz)] 68.1 kg (150 lb 2.1 oz) (03/29 0439)  HEMODYNAMICS:   VENTILATOR SETTINGS: Vent Mode:  [-] PRVC FiO2 (%):  [35 %-40 %] 40 % Set Rate:  [20 bmp] 20 bmp Vt Set:  [620 mL] 620 mL PEEP:  [5 cmH20] 5 cmH20 Pressure Support:  [8 cmH20] 8 cmH20 Plateau Pressure:  [14 cmH20-19 cmH20] 14 cmH20  INTAKE / OUTPUT: Intake/Output     03/28  0701 - 03/29 0700 03/29 0701 - 03/30 0700   I.V. (mL/kg) 230 (3.4)    Other 800    IV Piggyback 750    Total Intake(mL/kg) 1780 (26.1)    Urine (mL/kg/hr) 4945 (3)    Stool 200 (0.1)    Total Output 5145     Net -3365           PHYSICAL EXAMINATION: General: Alert and interactive. Neuro:  Follows command, cough/gag normalized. HEENT:  PERRL, tracheostomy intact. Cardiovascular:  RRR, no m/r/g. Lungs:  Bilateral diminished air entry, no w/r/r. Abdomen:  Soft, nontender, bowel sounds diminished. Musculoskeletal:  Edematous / erythematous RLE. Skin:  Sacral decub ulcer.  LABS:  Recent Labs Lab 08/31/12 1113  08/31/12 1732 09/01/12 0912  09/02/12 2337 09/03/12 0125  09/03/12 1238 09/04/12 0425 09/04/12 1854 09/05/12 0441 09/06/12 0430  HGB 15.0  < >  --  14.1  --   --   --   < >  --  12.4*  --  12.4* 13.0  WBC 12.6*  --   --  12.5*  --   --   --   < >  --  8.4  --  7.7 6.2  PLT 216  --   --  151  --   --   --   < >  --  180  --  174 208  NA  --   < > 137 137  --   --   --   < >  --  137 137 136 136  K  --   < > 4.3 4.4  --   --   --   < >  --  3.4* 3.4* 3.4* 3.7  CL  --   < > 98 98  --   --   --   < >  --  99 96 96 94*  CO2  --   < > 27 34*  --   --   --   < >  --  35* 34* 34* 32  GLUCOSE  --   < > 186* 86  --   --   --   < >  --  109* 96 90 107*  BUN  --   < > 17 15  --   --   --   < >  --  21 17 20  26*  CREATININE  --   < > 0.53 0.46*  --   --   --   < >  --  0.49* 0.46* 0.56 0.58  CALCIUM  --   < > 9.1 9.3  --   --   --   < >  --  8.7 8.6 8.8 8.9  MG  --   < > 2.1  --   --   --   --   --   --  2.1  --  1.9 2.4  PHOS  --   < > 5.0*  --   --   --   --   --   --  0.9*  --  2.9 2.8  AST  --   --  19 15  --   --   --   --   --   --   --   --   --   ALT  --   --  50 44  --   --   --   --   --   --   --   --   --   ALKPHOS  --   --  71 86  --   --   --   --   --   --   --   --   --   BILITOT  --   --  1.3* 0.8  --   --   --   --   --   --   --   --   --   PROT  --   --   7.1 7.4  --   --   --   --   --   --   --   --   --   ALBUMIN  --   --  3.4* 3.2*  --   --   --   --   --   --   --   --   --   APTT 55*  --   --   --   --   --   --   --   --   --   --   --   --   INR 3.33*  --   --  3.27*  < >  --   --   < >  --  1.88*  --  1.89* 2.01*  PHART  --   --   --   --   --  7.172* 7.476*  --  7.479*  --   --   --   --  PCO2ART  --   --   --   --   --  107.0* 47.5*  --  46.3*  --   --   --   --   PO2ART  --   --   --   --   --  148.0* 49.2*  --  67.4*  --   --   --   --   < > = values in this interval not displayed.  Recent Labs Lab 09/05/12 1211 09/05/12 1743 09/05/12 1956 09/05/12 2347 09/06/12 0434  GLUCAP 189* 117* 110* 114* 115*   CXR:  2/25 >>> Bibasilar athelectasis  ASSESSMENT / PLAN:  PULMONARY A:  Acute on chronic hypercarbic respiratory failure in setting of myasthenic crisis related to acute infection. P:   - Cuffed tracheostomy tube placed and failed TC overnight with increase in RR and increased secretion.  Will attempt again today. - Goal SpO2>92, pH>7.30. - Trend ABG / CXR. - Albuterol PRN. - Continue diureses  CARDIOVASCULAR A: Suspicion for MRSA endocarditis, nonocclusive TTE. P:  - Goal MAP>60 - TEE pending but will likely not be done until patient is more stable from a neurologic standpoint.  TTE was insufficient to evaluate for endocarditis. - ASA.  RENAL A:  No active issues.  Fluid overload. P:   - Trend BMP. - Replace K and Phos. - KVO IVF. - Low dose lasix.  GASTROINTESTINAL A:  No active issues. P:   - Continue TF per nutritionist recommendations. - Protonix for GI Px.  HEMATOLOGIC A:  History of DVT. P:  - Coumadin per pharmacy. - Trend CBC / INR. - DVT Px is not indicated (therapeutic Coumadin).  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 P:   - Cultures and antibiotics as above.  ENDOCRINE  A:  No active issues.  P:   - No intervention required.  NEUROLOGIC A:  MG.  Likely myasthenia  crisis exacerbated by acute infection. P:   - Prednisone for MG per neuro, increase if patient fails to respond to IVIG. - Pyridostigmine. - Neurology consult appreciated. - IV Ig given. - Fentanyl PRN. - Head CT negative.  TODAY'S SUMMARY:  Will transfer to SDU and back to FPTS, PCCM will sign off, please call back if needed, please insure TEE is performed to R/O endocarditis given TTE results.  I have personally obtained a history, examined the patient, evaluated laboratory and imaging results, formulated the assessment and plan and placed orders.  [name], MD Pulmonary and Critical Care Medicine [facility] Pager: [phone]  09/06/2012, 7:37 AM

## 2012-09-06 NOTE — Progress Notes (Signed)
PGY-2 Daily Progress Note Family Medicine Teaching Service Aldine Contes. Marti Sleigh, MD Service Pager: [phone]  FMTS to resume care of patient today as he is transferring to [facility].   Subjective: Patient's breathing more comfortable this AM. He cannot lift head off the pillow. Tired out yesterday with trach collar trial. Currently on trach collar trial.   Objective:  Temp:  [98.1 F (36.7 C)-99 F (37.2 C)] 99 F (37.2 C) (03/28 2349) Pulse Rate:  [75-109] 75 (03/29 0700) Resp:  [19-25] 20 (03/29 0700) BP: (81-137)/(55-85) 81/57 mmHg (03/29 0700) SpO2:  [92 %-99 %] 99 % (03/29 0700) FiO2 (%):  [35 %-40 %] 40 % (03/29 0600) Weight:  [150 lb 2.1 oz (68.1 kg)] 150 lb 2.1 oz (68.1 kg) (03/29 0439)  Intake/Output Summary (Last 24 hours) at 09/06/12 4540 Last data filed at 09/06/12 0600  Gross per 24 hour  Intake   1690 ml  Output   4695 ml  Net  -3005 ml   General: awake, resting comfortably in bed,  Alert, communicates with clipboard or mouthing things  Heart: RRR. No murmurs, rubs, or gallops.  Lungs: CTAB.  Abd: PEG in place with feeds  Extremities: RLE - slight erythema and some  induration around I&D site with pus drainage.  Neuro: mvoes all extremities, able to give thumbs up and fist bump. Cannot lift head off of bed for more than 1 second.   Labs and imaging:   CBC  Recent Labs Lab 09/04/12 0425 09/05/12 0441 09/06/12 0430  WBC 8.4 7.7 6.2  HGB 12.4* 12.4* 13.0  HCT 37.5* 37.7* 38.4*  PLT 180 174 208   BMET/CMET  Recent Labs Lab 08/31/12 1732 09/01/12 0912  09/04/12 1854 09/05/12 0441 09/06/12 0430  NA 137 137  < > 137 136 136  K 4.3 4.4  < > 3.4* 3.4* 3.7  CL 98 98  < > 96 96 94*  CO2 27 34*  < > 34* 34* 32  BUN 17 15  < > 17 20 26*  CREATININE 0.53 0.46*  < > 0.46* 0.56 0.58  CALCIUM 9.1 9.3  < > 8.6 8.8 8.9  PROT 7.1 7.4  --   --   --   --   BILITOT 1.3* 0.8  --   --   --   --   ALKPHOS 71 86  --   --   --   --   ALT 50 44  --   --   --   --    AST 19 15  --   --   --   --   GLUCOSE 186* 86  < > 96 90 107*  < > = values in this interval not displayed. PROTIME-INR     Status: Abnormal   Collection Time    09/06/12  4:30 AM      Result Value Range   Prothrombin Time 22.0 (*) 11.6 - 15.2 seconds   INR 2.01 (*) 0.00 - 1.49  MAGNESIUM     Status: None   Collection Time    09/06/12  4:30 AM      Result Value Range   Magnesium 2.4  1.5 - 2.5 mg/dL  PHOSPHORUS     Status: None   Collection Time    09/06/12  4:30 AM      Result Value Range   Phosphorus 2.8  2.3 - 4.6 mg/dL    I have reviewed the patient's current medications. Scheduled: . antiseptic oral rinse  15 mL Mouth Rinse QID  . aspirin  81 mg Per Tube Daily  . chlorhexidine  15 mL Mouth Rinse BID  . feeding supplement  30 mL Per Tube TID WC  . furosemide  40 mg Intravenous Once  . Immune Globulin 5%  400 mg/kg Intravenous Q24 Hr x 5  . insulin aspart  0-15 Units Subcutaneous Q4H  . pantoprazole sodium  40 mg Per Tube Q1200  . potassium phosphate IVPB (mmol)  30 mmol Intravenous Once  . predniSONE  70 mg Per Tube Q breakfast  . pyridostigmine  90 mg Per Tube 5 X Daily  . sodium chloride  3 mL Intravenous Q12H  . sterile water for irrigation  200 mL Irrigation Q4H  . vancomycin  750 mg Intravenous Q12H  . vancomycin  1,000 mg Intravenous Once  . Warfarin - Pharmacist Dosing Inpatient   Does not apply q1800   Continuous: . feeding supplement (JEVITY 1.2 CAL) 1,000 mL (09/06/12 0522)   RUE:AVWUJW chloride, acetaminophen, acetaminophen, albuterol, fentaNYL, ondansetron (ZOFRAN) IV, ondansetron, sodium chloride, sodium chloride  Assessment  57 yo M with RLE cellulitis and multiple abscesses as well as MRSA bacteremia now with associated myasthenic criss.   #Myasthenic Crisis with ventilator dependence/myasthenia gravis-thought related to increased prednisone abruptly (given for stress dose) but possibly due to infection given crisis started <12 hours after admin  high dose prednisone and typically 5-10 days after increased dose of prednisone. .  -appreciate continued neurology recommendations -now s/p IVIG x 2 days (plan 5 per neuro) and continued on high dose prednisone 70mg  in addition to pyridostigmine -appears to have improved but unable to wean off of ventilator yet  *CCM was diuresing, appears euvolemic so will hold -per CCM repeat trach collar trial today. RT/RN to follow closely as may need another break overnight or after 8 hours.   #MRSA bacteremia-likely source RLE cellulitis and multiple abscess. MRI tib/fib showed cellulitis, with no infiltration to muscle/bone.  -continue Vanc per pharmacy -appreciate ID recs -needs TEE once more stable to r/o endocarditis. TTE difficult to interpret.   -will address timing of this with Dr. Deirdre Priest  # H/o DVT on coumadin- coumadin per pharmacy. INR at 2 today.  #Anxiety-hold benzos d/t respiratory status # SNF resident-? Return to SNF eventually at [facility] vs. Place more equipped for vents.   FEN/GI -  Chronic Peg- feeds per nutrition orders Ppx- coumadin per pharmacy. Protonix while on vent Dispo- tx SDU today under FM care Code: FULL  Contact: Kavian Peters, father: [phone]   Tana Conch, MD PGY2, Family Medicine Teaching Service [phone]

## 2012-09-06 NOTE — Progress Notes (Signed)
ANTICOAGULATION CONSULT NOTE - Follow Up Consult  Pharmacy Consult for coumadin Indication: Recent DVT  No Known Allergies  Patient Measurements: Height: 6' (182.9 cm) Weight: 150 lb 2.1 oz (68.1 kg) IBW/kg (Calculated) : 77.6  Vital Signs: Temp: 98.2 F (36.8 C) (03/29 0800) Temp src: Oral (03/29 0800) BP: 104/60 mmHg (03/29 0900) Pulse Rate: 79 (03/29 0900)  Labs:  Recent Labs  09/04/12 0425 09/04/12 1854 09/05/12 0441 09/06/12 0430  HGB 12.4*  --  12.4* 13.0  HCT 37.5*  --  37.7* 38.4*  PLT 180  --  174 208  LABPROT 20.9*  --  21.0* 22.0*  INR 1.88*  --  1.89* 2.01*  CREATININE 0.49* 0.46* 0.56 0.58    Assessment: 57 yo male on Coumadin for hx recent DVT (1/14). PTA Coumadin regimen: 7.5mg  daily except 5mg  MWF. INR on admission supratherapeutic (3.33). INR today therapeutic 2.01. Looks like coumadin dose was not entered yesterday so pt did not receive any coumadin yesterday - safety zone portal completed. CBC is stable. No bleeding reported.  Goal of Therapy:  INR 2-3 Monitor platelets by anticoagulation protocol: Yes   Plan:  - Coumadin 7.5mg  po x 1 today - Will f/up daily INR  Christoper Fabian, PharmD, BCPS Clinical pharmacist, pager [phone] 09/06/2012  10:28 AM

## 2012-09-07 DIAGNOSIS — J9600 Acute respiratory failure, unspecified whether with hypoxia or hypercapnia: Secondary | ICD-10-CM

## 2012-09-07 LAB — MAGNESIUM: Magnesium: 2.3 mg/dL (ref 1.5–2.5)

## 2012-09-07 LAB — BASIC METABOLIC PANEL
CO2: 33 mEq/L — ABNORMAL HIGH (ref 19–32)
Chloride: 91 mEq/L — ABNORMAL LOW (ref 96–112)
Creatinine, Ser: 0.53 mg/dL (ref 0.50–1.35)
GFR calc Af Amer: >90 mL/min (ref >90)
Sodium: 131 mEq/L — ABNORMAL LOW (ref 135–145)

## 2012-09-07 LAB — CBC
Hemoglobin: 13.5 g/dL (ref 13.0–17.0)
MCH: 29.9 pg (ref 26.0–34.0)
MCHC: 33.1 g/dL (ref 30.0–36.0)
Platelets: 215 10*3/uL (ref 150–400)

## 2012-09-07 LAB — PROTIME-INR
INR: 1.66 — ABNORMAL HIGH (ref 0.00–1.49)
Prothrombin Time: 19.1 seconds — ABNORMAL HIGH (ref 11.6–15.2)

## 2012-09-07 LAB — PHOSPHORUS: Phosphorus: 2.9 mg/dL (ref 2.3–4.6)

## 2012-09-07 LAB — GLUCOSE, CAPILLARY
Glucose-Capillary: 93 mg/dL (ref 70–99)
Glucose-Capillary: 153 mg/dL — ABNORMAL HIGH (ref 70–99)
Glucose-Capillary: 135 mg/dL — ABNORMAL HIGH (ref 70–99)

## 2012-09-07 LAB — VANCOMYCIN, TROUGH: Vancomycin Tr: 10.6 ug/mL (ref 10.0–20.0)

## 2012-09-07 MED ORDER — SODIUM CHLORIDE 0.9 % IV SOLN: INTRAVENOUS | Status: DC

## 2012-09-07 MED ORDER — VANCOMYCIN HCL 1000 MG IV SOLR
750.0000 mg | Freq: Three times a day (TID) | INTRAVENOUS | Status: DC
  Administered 2012-09-07 – 2012-09-20 (×38): 750 mg via INTRAVENOUS
  Filled 2012-09-07 (×45): qty 750

## 2012-09-07 MED ORDER — SODIUM CHLORIDE 0.9 % IV SOLN
INTRAVENOUS | Status: DC
  Administered 2012-09-07: via INTRAVENOUS

## 2012-09-07 MED ORDER — DEXTROSE-NACL 5-0.9 % IV SOLN
INTRAVENOUS | Status: DC
  Administered 2012-09-07 – 2012-09-09 (×2): via INTRAVENOUS

## 2012-09-07 MED ORDER — WARFARIN SODIUM 7.5 MG PO TABS
7.5000 mg | ORAL_TABLET | Freq: Once | ORAL | Status: AC
  Administered 2012-09-07: 7.5 mg
  Filled 2012-09-07: qty 1

## 2012-09-07 NOTE — Progress Notes (Addendum)
PULMONARY  / CRITICAL CARE MEDICINE  Name: Victor Durham MRN: [id number] DOB: 1955-09-16    ADMISSION DATE:  08/31/2012 CONSULTATION DATE:  09/03/2012  REFERRING MD :  FPTS  CHIEF COMPLAINT:  Hypercarbic respiratory failure  BRIEF PATIENT DESCRIPTION: 42 yowm with myasthenia gravis and chronic PEG/ tracheostomy admitted 3/23 with RLE cellulitis and G+ bacteremia.  Became obtunded on 3/26 and ABG reveled profound hypercarbia / acidosis.  Transferred to ICU.  SIGNIFICANT EVENTS / STUDIES:  3/23  Admitted with RLE cellulitis 3/24  RLL MRI >>> Focal subcutaneous fat soft tissue ulceration with a 13 mm pocket of pus at the inferior lateral aspect of the ulceration. No deep abscesses or myositis or osteomyelitis.  Circumferential soft tissue edema in the right lower leg which could represent cellulitis 3/26  Developed acute hypercarbic respiratory failure, transferred to ICU, cuffed tracheostomy tube placed, mechanical ventilation instituted 3/25  2D echo >>>  Poor quality for evaluation of endocarditis 3/26  Head CT >>>Neg 3/26 rx  IVIG 3/29 AM  D/c vent   LINES / TUBES: Chronic tracheostomy Chronic PEG R PICC 3/25 >>>  CULTURES: Abscess 3/23 >>> MRSA Blood 3/23 >>> MRSA x2  [facility] 3/25 x 1 >>> Blood 3/26 x2  >>> Urine 3/27 > neg  ANTIBIOTICS: Vancomycin 3/23 >>>  Subjective: nad on t collar, denies sob or cp or cough  VITAL SIGNS: Temp:  [97.4 F (36.3 C)-98.7 F (37.1 C)] 97.4 F (36.3 C) (03/30 0800) Pulse Rate:  [66-92] 67 (03/30 0817) Resp:  [17-23] 18 (03/30 0817) BP: (101-126)/(58-82) 109/77 mmHg (03/30 0817) SpO2:  [92 %-99 %] 99 % (03/30 0817) FiO2 (%):  [35 %] 35 % (03/30 0817) Weight:  [152 lb 1.9 oz (69 kg)-155 lb (70.308 kg)] 152 lb 1.9 oz (69 kg) (03/30 0424)       VENTILATOR SETTINGS: Vent Mode:  [-]  FiO2 (%):  [35 %] 35 %  INTAKE / OUTPUT: Intake/Output     03/29 0701 - 03/30 0700 03/30 0701 - 03/31 0700   I.V. (mL/kg) 603 (8.7)    Other 1610  210   NG/GT 1170 200   IV Piggyback 650    Total Intake(mL/kg) 4033 (58.4) 410 (5.9)   Urine (mL/kg/hr) 2675 (1.6) 1440 (5.3)   Stool 300 (0.2)    Total Output 2975 1440   Net +1058 -1030         PHYSICAL EXAMINATION: General: Alert and interactive. Neuro:  Follows command, cough/gag normalized. HEENT:  PERRL, tracheostomy intact. Cardiovascular:  RRR, no m/r/g. Lungs:  Bilateral diminished air entry, no w/r/r. Abdomen:  Soft, nontender, bowel sounds nl, tol peg feeding  Musculoskeletal:  Edematous / erythematous RLE. Skin:  Sacral decub ulcer.  LABS:  Recent Labs Lab 08/31/12 1113  08/31/12 1732 09/01/12 0912  09/02/12 2337 09/03/12 0125  09/03/12 1238  09/05/12 0441 09/06/12 0430 09/07/12 0500  HGB 15.0  < >  --  14.1  --   --   --   < >  --   < > 12.4* 13.0 13.5  WBC 12.6*  --   --  12.5*  --   --   --   < >  --   < > 7.7 6.2 7.4  PLT 216  --   --  151  --   --   --   < >  --   < > 174 208 215  NA  --   < > 137 137  --   --   --   < >  --   < >  136 136 131*  K  --   < > 4.3 4.4  --   --   --   < >  --   < > 3.4* 3.7 3.5  CL  --   < > 98 98  --   --   --   < >  --   < > 96 94* 91*  CO2  --   < > 27 34*  --   --   --   < >  --   < > 34* 32 33*  GLUCOSE  --   < > 186* 86  --   --   --   < >  --   < > 90 107* 117*  BUN  --   < > 17 15  --   --   --   < >  --   < > 20 26* 25*  CREATININE  --   < > 0.53 0.46*  --   --   --   < >  --   < > 0.56 0.58 0.53  CALCIUM  --   < > 9.1 9.3  --   --   --   < >  --   < > 8.8 8.9 8.9  MG  --   --  2.1  --   --   --   --   --   --   < > 1.9 2.4 2.3  PHOS  --   --  5.0*  --   --   --   --   --   --   < > 2.9 2.8 2.9  AST  --   --  19 15  --   --   --   --   --   --   --   --   --   ALT  --   --  50 44  --   --   --   --   --   --   --   --   --   ALKPHOS  --   --  71 86  --   --   --   --   --   --   --   --   --   BILITOT  --   --  1.3* 0.8  --   --   --   --   --   --   --   --   --   PROT  --   --  7.1 7.4  --   --   --   --   --    --   --   --   --   ALBUMIN  --   --  3.4* 3.2*  --   --   --   --   --   --   --   --   --   APTT 55*  --   --   --   --   --   --   --   --   --   --   --   --   INR 3.33*  --   --  3.27*  < >  --   --   < >  --   < > 1.89* 2.01* 1.66*  PHART  --   --   --   --   --  7.172* 7.476*  --  7.479*  --   --   --   --   PCO2ART  --   --   --   --   --  107.0* 47.5*  --  46.3*  --   --   --   --   PO2ART  --   --   --   --   --  148.0* 49.2*  --  67.4*  --   --   --   --   < > = values in this interval not displayed.  Recent Labs Lab 09/06/12 1531 09/06/12 2056 09/07/12 0056 09/07/12 0432 09/07/12 0821  GLUCAP 128* 108* 93 114* 131*   CXR:   3/27> Some improvement right basilar aeration. No other change.   ASSESSMENT / PLAN:  PULMONARY A:  Acute on chronic hypercarbic respiratory failure in setting of myasthenic crisis related to acute infection. P:    tol tcollar since am 3/29  - Albuterol PRN.    CARDIOVASCULAR A: Suspicion for MRSA endocarditis, nonocclusive TTE. P:  - Goal MAP>60 - TEE pending but will likely not be done until patient is more stable from a neurologic standpoint.  TTE was insufficient to evaluate for endocarditis. - ASA.  RENAL A:  No active issues.  Fluid overload. P:   - Trend BMP.    GASTROINTESTINAL A:  No active issues. P:   - Continue TF per nutritionist recommendations. - Protonix for GI Px.  HEMATOLOGIC A:  History of DVT. P:  - Coumadin per pharmacy. - Trend CBC / INR. - DVT Px is not indicated (therapeutic Coumadin).  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 P:   - Cultures and antibiotics as per dashboard    NEUROLOGIC A:  MG.  Likely myasthenia crisis exacerbated by acute infection. - Head CT negative 3/26 P:   - Prednisone for MG per neuro, increase if patient fails to respond to IVIG. - Pyridostigmine. - Neurology consult appreciated. - IV Ig given. - Fentanyl PRN.   TODAY'S SUMMARY:    please insure TEE is  performed to R/O endocarditis given TTE results.  PCCM svc available prn     Sandrea Hughs, MD Pulmonary and Critical Care Medicine [facility] Cell [phone] After 5:30 PM or weekends, call [phone]

## 2012-09-07 NOTE — Progress Notes (Signed)
NEURO HOSPITALIST PROGRESS NOTE   SUBJECTIVE:                                                                                                                      No new neurological developments. He seems to be breathing more comfortably but still on trach collar. IVG 3/5 days today. Prednisone 70 mg daily and mestinon.  OBJECTIVE:                                                                                                                           Vital signs in last 24 hours: Temp:  [97.6 F (36.4 C)-98.7 F (37.1 C)] 97.6 F (36.4 C) (03/30 0424) Pulse Rate:  [66-92] 66 (03/30 0424) Resp:  [15-23] 17 (03/30 0424) BP: (94-126)/(58-82) 110/82 mmHg (03/30 0424) SpO2:  [92 %-100 %] 97 % (03/30 0424) FiO2 (%):  [35 %] 35 % (03/30 0424) Weight:  [69 kg (152 lb 1.9 oz)-70.308 kg (155 lb)] 69 kg (152 lb 1.9 oz) (03/30 0424)  Intake/Output from previous day: 03/29 0701 - 03/30 0700 In: 3963 [I.V.:603; NG/GT:1170; IV Piggyback:650] Out: 2975 [Urine:2675; Stool:300] Intake/Output this shift:   Nutritional status:    Past Medical History  Diagnosis Date  . DVT (deep venous thrombosis)   . Hypertension   . High cholesterol   . Myasthenia gravis   . Anxiety   . Dysphagia 06/30/12    Neurologic ROS negative with exception of above.   Neurologic Exam:  Alert, oriented,follows commands, able to use his hands to communicate.  Cranial Nerves:  II: Visual fields grossly normal, pupils equal, round, reactive to light and accommodation  III,IV, VI: ptosis present left eye, extra-ocular motions intact bilaterally  V,VII: smile symmetric, facial light touch sensation normal bilaterally  VIII: hearing normal bilaterally  IX,X: gag reflex present  XI: bilateral shoulder shrug  XII: midline tongue extension  Motor: weakness neck flexors and extensors.  Right : Upper extremity 5/5 Left: Upper extremity 5/5  Lower extremity 5/5 Lower extremity  5/5  Tone and bulk:normal tone throughout; no atrophy noted  Sensory: Pinprick and light touch intact throughout, bilaterally  Deep Tendon Reflexes: 2+ and symmetric throughout UE, 1+ bilateral KJ and no AJ  Plantars:  Mute bilaterally  Cerebellar:  normal finger-to-nose,  CV: pulses palpable throughout    Lab Results: Lab Results  Component Value Date/Time   CHOL 223* 06/27/2009  8:22 PM   Lipid Panel No results found for this basename: CHOL, TRIG, HDL, CHOLHDL, VLDL, LDLCALC,  in the last 72 hours  Studies/Results: No results found.  MEDICATIONS                                                                                                                       I have reviewed the patient's current medications.  ASSESSMENT/PLAN:                                                                                                             Pleasant 57 years old male with known MG admitted with myasthenic crisis in the context of acute infection. Some improvement but still on trach collar and significantly weak neck muscles. Continue current treatment. Plan for 5 days IVIG and then decide about PLEX. Will continue to follow.  Wyatt Portela ,MD  [facility] [phone]  09/07/2012, 8:08 AM

## 2012-09-07 NOTE — Progress Notes (Addendum)
ANTICOAGULATION/ANTIBIOTIC CONSULT NOTE - Follow Up Consult  Pharmacy Consult for coumadin Indication: Recent DVT/MRSA bacteremia  No Known Allergies  Patient Measurements: Height: 6' (182.9 cm) Weight: 152 lb 1.9 oz (69 kg) IBW/kg (Calculated) : 77.6  Vital Signs: Temp: 97.4 F (36.3 C) (03/30 0800) Temp src: Oral (03/30 0800) BP: 109/77 mmHg (03/30 0817) Pulse Rate: 67 (03/30 0817)  Labs:  Recent Labs  09/05/12 0441 09/06/12 0430 09/07/12 0500  HGB 12.4* 13.0 13.5  HCT 37.7* 38.4* 40.8  PLT 174 208 215  LABPROT 21.0* 22.0* 19.1*  INR 1.89* 2.01* 1.66*  CREATININE 0.56 0.58 0.53    Assessment: 57 yo male on Coumadin for hx recent DVT (1/14). PTA Coumadin regimen: 7.5mg  daily except 5mg  MWF. INR on admission supratherapeutic (3.33). INR today subtherapeutic 1.66. This is due to coumadin dose not being entered 3/28 so pt did not receive any coumadin then. CBC is stable. No bleeding reported.  Mr. Wambold continues on Vancomycin for MRSA bacteremia. Noted TEE planned tomorrow. Vancomycin trough is sub-therapeutic, at this time. Level is difficult to interpret as it was drawn significantly early (315 instead of 2030), would expect level to be elevated from this though instead of low. Noted patient has a normal looking Scr, likely d/t hx of MG, and UOP is excellent.   3/23 vanc>> 3/23 zosyn > 3/24 3/25 VT 5.3 3/24 ancef>>3/25  3/23: MRSA ZDG:[id number] 3/23: Blood:MRSA 3/23: Wound cx LE abscess: Staph (sens pnding) Blood 3/25 x1 >>> ngtd Blood 3/26 >>> ngtd  Goal of Therapy:  INR 2-3 Monitor platelets by anticoagulation protocol: Yes Vancomycin trough level 15-20 mcg/ml    Plan:  - Coumadin 7.5mg  po x 1 today - Will f/up daily INR - Increase vancomycin to 750mg  q8hr - follow troughs closely as renal function is highly variable   Vania Rea. Darin Engels.D. Clinical Pharmacist Pager [phone] Phone [phone] 09/07/2012 9:35 AM

## 2012-09-07 NOTE — Progress Notes (Signed)
Attempted to d/c f/c, pt refused. Pt. Reports acute urinary retention. Notified physican. Will continue to eval treatment effectiveness

## 2012-09-07 NOTE — Progress Notes (Signed)
PGY-2 Daily Progress Note Family Medicine Teaching Service Aldine Contes. Marti Sleigh, MD Service Pager: [phone]  Subjective: Patient tolerated trach collar overnight. Can lift head off pillow for 30 seconds and could only do it 1-2 seconds yesterday.   Objective:  Temp:  [97.4 F (36.3 C)-98.7 F (37.1 C)] 97.4 F (36.3 C) (03/30 0800) Pulse Rate:  [66-92] 67 (03/30 0817) Resp:  [15-23] 18 (03/30 0817) BP: (101-126)/(58-82) 109/77 mmHg (03/30 0817) SpO2:  [92 %-99 %] 99 % (03/30 0817) FiO2 (%):  [35 %] 35 % (03/30 0817) Weight:  [152 lb 1.9 oz (69 kg)-155 lb (70.308 kg)] 152 lb 1.9 oz (69 kg) (03/30 0424)  Intake/Output Summary (Last 24 hours) at 09/07/12 0841 Last data filed at 09/07/12 0800  Gross per 24 hour  Intake   3783 ml  Output   2975 ml  Net    808 ml   General: awake, resting comfortably in bed,  Alert, communicates with clipboard or mouthing things  Heart: RRR. No murmurs, rubs, or gallops.  Lungs: CTAB.  Abd: PEG in place with feeds. Site c/d/i.  Extremities: RLE - slight erythema and some  induration around I&D site with pus drainage.  Neuro: moves all extremities, able to give thumbs up and fist bump. Head off pillow for >30 seconds. Much improved.    Labs and imaging:   CBC  Recent Labs Lab 09/05/12 0441 09/06/12 0430 09/07/12 0500  WBC 7.7 6.2 7.4  HGB 12.4* 13.0 13.5  HCT 37.7* 38.4* 40.8  PLT 174 208 215   BMET/CMET  Recent Labs Lab 08/31/12 1732 09/01/12 0912  09/05/12 0441 09/06/12 0430 09/07/12 0500  NA 137 137  < > 136 136 131*  K 4.3 4.4  < > 3.4* 3.7 3.5  CL 98 98  < > 96 94* 91*  CO2 27 34*  < > 34* 32 33*  BUN 17 15  < > 20 26* 25*  CREATININE 0.53 0.46*  < > 0.56 0.58 0.53  CALCIUM 9.1 9.3  < > 8.8 8.9 8.9  PROT 7.1 7.4  --   --   --   --   BILITOT 1.3* 0.8  --   --   --   --   ALKPHOS 71 86  --   --   --   --   ALT 50 44  --   --   --   --   AST 19 15  --   --   --   --   GLUCOSE 186* 86  < > 90 107* 117*  < > = values in  this interval not displayed.     I have reviewed the patient's current medications. Scheduled: . antiseptic oral rinse  15 mL Mouth Rinse QID  . aspirin  81 mg Per Tube Daily  . chlorhexidine  15 mL Mouth Rinse BID  . feeding supplement  30 mL Per Tube TID WC  . Immune Globulin 5%  400 mg/kg Intravenous Q24 Hr x 5  . insulin aspart  0-15 Units Subcutaneous Q4H  . pantoprazole sodium  40 mg Per Tube Q1200  . predniSONE  70 mg Per Tube Q breakfast  . pyridostigmine  90 mg Per Tube 5 X Daily  . sodium chloride  3 mL Intravenous Q12H  . sterile water for irrigation  200 mL Irrigation Q4H  . vancomycin  750 mg Intravenous Q12H  . vancomycin  1,000 mg Intravenous Once  . Warfarin - Pharmacist Dosing  Inpatient   Does not apply q1800   Continuous: . feeding supplement (JEVITY 1.2 CAL) 1,000 mL (09/07/12 0107)   WUJ:WJXBJY chloride, acetaminophen, acetaminophen, albuterol, fentaNYL, ondansetron (ZOFRAN) IV, ondansetron, sodium chloride, sodium chloride  Assessment  57 yo M with RLE cellulitis and multiple abscesses as well as MRSA bacteremia now with associated myasthenic criss.   #Myasthenic Crisis with ventilator dependence/myasthenia gravis-thought related to increased prednisone abruptly (given for stress dose) but possibly due to infection given crisis started <12 hours after admin high dose prednisone and typically 5-10 days after increased dose of prednisone. .  -appreciate continued neurology recommendations -now s/p IVIG x 3 days (plan 5 per neuro) and continued on high dose prednisone 70mg  in addition to pyridostigmine -appears to have improved, now on trach collar and weaned from vent x 24 hours  *continue to monitor, may need break on vent at some point.   *CCM was diuresing, appears euvolemic so will hold  #MRSA bacteremia-likely source RLE cellulitis and multiple abscess. MRI tib/fib showed cellulitis, with no infiltration to muscle/bone.   -continue Vanc per  pharmacy  -TEE tomorrow with [facility] cards (orders in and tube feeds held at midnight).   -appreciate ID recs  # H/o DVT on coumadin- coumadin per pharmacy.  #Anxiety-hold benzos d/t respiratory status # SNF resident-? Return to SNF eventually at [facility] plan for now  FEN/GI -  Chronic Peg- feeds per nutrition orders. Trend bmet for hyponatremia.  Ppx- coumadin per pharmacy. Protonix started on vent. Consider d/c tomorrow.  Dispo- tx SDU today under FM care Code: FULL  Contact: Jermiah Soderman, father: [phone]   Tana Conch, MD PGY2, Family Medicine Teaching Service [phone]

## 2012-09-07 NOTE — Progress Notes (Signed)
Family Medicine Teaching Service Attending Note  I interviewed and examined patient Victor Durham and reviewed their tests and x-rays.  I discussed with Dr. Durene Cal and reviewed their note for today.  I agree with their assessment and plan.     Additionally  Feels well stronger no pain Proceed with TEE tomorrow. Needs Physical Therapy for strengthening Continue MG treatment as per neuro - appreciate their input

## 2012-09-07 NOTE — Progress Notes (Signed)
Foley Catheter Discontinued 09/07/12 at 14:20

## 2012-09-08 DIAGNOSIS — L02212 Cutaneous abscess of back [any part, except buttock]: Secondary | ICD-10-CM

## 2012-09-08 DIAGNOSIS — M4628 Osteomyelitis of vertebra, sacral and sacrococcygeal region: Secondary | ICD-10-CM

## 2012-09-08 DIAGNOSIS — G7001 Myasthenia gravis with (acute) exacerbation: Secondary | ICD-10-CM

## 2012-09-08 DIAGNOSIS — Z931 Gastrostomy status: Secondary | ICD-10-CM

## 2012-09-08 LAB — CULTURE, BLOOD (SINGLE): Culture: NO GROWTH

## 2012-09-08 LAB — CBC
HCT: 39.4 % (ref 39.0–52.0)
Platelets: 229 10*3/uL (ref 150–400)
RBC: 4.39 MIL/uL (ref 4.22–5.81)
RDW: 15.1 % (ref 11.5–15.5)
WBC: 7.5 10*3/uL (ref 4.0–10.5)

## 2012-09-08 LAB — BASIC METABOLIC PANEL
CO2: 32 mEq/L (ref 19–32)
Glucose, Bld: 103 mg/dL — ABNORMAL HIGH (ref 70–99)
Potassium: 3.7 mEq/L (ref 3.5–5.1)
Sodium: 136 mEq/L (ref 135–145)

## 2012-09-08 LAB — GLUCOSE, CAPILLARY
Glucose-Capillary: 105 mg/dL — ABNORMAL HIGH (ref 70–99)
Glucose-Capillary: 104 mg/dL — ABNORMAL HIGH (ref 70–99)
Glucose-Capillary: 97 mg/dL (ref 70–99)

## 2012-09-08 LAB — PROTIME-INR: INR: 1.71 — ABNORMAL HIGH (ref 0.00–1.49)

## 2012-09-08 MED ORDER — PRO-STAT SUGAR FREE PO LIQD
30.0000 mL | Freq: Two times a day (BID) | ORAL | Status: DC
  Administered 2012-09-08 – 2012-09-20 (×24): 30 mL
  Filled 2012-09-08 (×25): qty 30

## 2012-09-08 MED ORDER — DEXTROSE-NACL 5-0.9 % IV SOLN
INTRAVENOUS | Status: DC
Start: 2012-09-08 — End: 2012-09-08

## 2012-09-08 MED ORDER — WARFARIN SODIUM 7.5 MG PO TABS
7.5000 mg | ORAL_TABLET | Freq: Once | ORAL | Status: AC
  Administered 2012-09-08: 7.5 mg via ORAL
  Filled 2012-09-08: qty 1

## 2012-09-08 NOTE — Evaluation (Signed)
Passy-Muir Speaking Valve - Evaluation Patient Details  Name: Victor Durham MRN: [id number] Date of Birth: 11-Oct-1955  Today's Date: 09/08/2012 Time: 7829-5621 SLP Time Calculation (min): 23 min  Past Medical History:  Past Medical History  Diagnosis Date  . DVT (deep venous thrombosis)   . Hypertension   . High cholesterol   . Myasthenia gravis   . Anxiety   . Dysphagia 06/30/12   Past Surgical History:  Past Surgical History  Procedure Laterality Date  . Esophagogastroduodenoscopy  07/11/2012    Procedure: ESOPHAGOGASTRODUODENOSCOPY (EGD);  Surgeon: Shirley Friar, MD;  Location: [facility];  Service: Endoscopy;  Laterality: N/A;  BEDSIDE  . Tracheostomy N/A    HPI:  57 y/o homeless male now at SNF, recnet dx during admission 1/30 with Myasthenia Gravis and aspiration pna. Mestinon, plasmapheresis started 2/4. ETT 1/30-2/9. Trach placed 2/11, PEG 2/13. Pt evaluated on 2/14 for PMSV, by 2/26 prior to d/c pt was tolerating during all waking hours with full supervision moving toward intermittent supervision. Pt d/c'd to SNF.  Readmitted 3/23 with RLE cellulitis and G+ bacteremia. Became obtunded on 3/26 and ABG reveled profound hypercarbia / acidosis. off the vent on 3/29.    Assessment / Plan / Recommendation Clinical Impression  Pt known to this SLP from prior admission. Pt with PMSV attached to trach via leash.SLP removed small amout of air from cuff  resulting in prolonged cough/discomfort from pt, barely relieved by deep suction from RN. With brief placement of PMSV pt with poor breath support and itelligibility. Pt still relying on writing to communicate. Pt states his "lungs are tired" and writes that he aspirated mouthwash at the SNF and that is why he is here. At this time, pt is not tolerating valve due to fatigue and dicomfort. Pt may keep cuff deflated (ok per MD) and SLP will try again in am. PMSV with SLP only.     SLP Assessment  Patient needs continued  [facility]    Follow Up Recommendations       Frequency and Duration min 2x/week  2 weeks   Pertinent Vitals/Pain NA    SLP Goals Potential to Achieve Goals: Good SLP Goal #1: Pt will tolerate PMSV during all waking hours with intermittent supervision.  SLP Goal #2: Pt will demonstrate removal and placement of valve with supervision cues.  SLP Goal #3: Pt will verbalize intelligibly at conversation level with min cues for breath support.    PMSV Trial  PMSV was placed for: 30seconds Able to redirect subglottic air through upper airway: Yes Able to Attain Phonation: Yes Voice Quality: Breathy;Low vocal intensity;Hoarse Able to Expectorate Secretions: Yes Level of Secretion Expectoration with PMSV: Oral;Tracheal Breath Support for Phonation: Inadequate Intelligibility: Intelligibility reduced Word: 25-49% accurate Phrase: 25-49% accurate Sentence: 0-24% accurate Conversation: 0-24% accurate Respirations During Trial: 25 SpO2 During Trial: 96 %   Tracheostomy Tube  Additional Tracheostomy Tube Assessment Trach Collar Period: 24 hours Secretion Description: thin clear Level of Secretion Expectoration: Tracheal    Vent Dependency  Vent Dependent: No FiO2 (%): 40 %    Cuff Deflation Trial Tolerated Cuff Deflation: Yes Length of Time for Cuff Deflation Trial: 20 min Behavior: Alert   Emy Angevine, Riley Nearing 09/08/2012, 2:54 PM

## 2012-09-08 NOTE — Progress Notes (Signed)
ANTICOAGULATION/ANTIBIOTIC CONSULT NOTE - Follow Up Consult  Pharmacy Consult for coumadin Indication: Recent DVT/MRSA bacteremia  No Known Allergies  Patient Measurements: Height: 6' (182.9 cm) Weight: 152 lb 1.9 oz (69 kg) IBW/kg (Calculated) : 77.6  Vital Signs: Temp: 98.3 F (36.8 C) (03/31 0755) Temp src: Oral (03/31 0755) BP: 110/75 mmHg (03/31 0755) Pulse Rate: 74 (03/31 0755)  Labs:  Recent Labs  09/06/12 0430 09/07/12 0500 09/08/12 0645  HGB 13.0 13.5 13.0  HCT 38.4* 40.8 39.4  PLT 208 215 229  LABPROT 22.0* 19.1* 19.5*  INR 2.01* 1.66* 1.71*  CREATININE 0.58 0.53 0.53    Assessment: 57 yo male on Coumadin for hx recent DVT (1/14). PTA Coumadin regimen: 7.5mg  daily except 5mg  MWF. INR on admission supratherapeutic (3.33). INR today subtherapeutic 1.71. This is due to coumadin dose not being entered 3/28 so pt did not receive any coumadin then. CBC is stable. No bleeding reported.  Mr. Hamrick continues on Vancomycin for MRSA bacteremia. TEE has not been completed yet. Vancomycin trough is sub-therapeutic on 3/30 and dose was adjusted.  3/23 vanc>> 3/23 zosyn > 3/24 3/25 VT 5.3 3/24 ancef>>3/25  3/23: MRSA ZOX:[id number] 3/23: Blood:MRSA 3/23: Wound cx LE abscess: Staph (sens pnding) Blood 3/25 x1 >>> ngtd Blood 3/26 >>> ngtd  Goal of Therapy:  INR 2-3 Monitor platelets by anticoagulation protocol: Yes Vancomycin trough level 15-20 mcg/ml    Plan:  - Coumadin 7.5mg  po x 1 today - Will f/up daily INR - Continue vancomycin to 750mg  q8hr  - Vancomycin trough in AM  Celedonio Miyamoto, PharmD, [facility] Clinical Pharmacist Pager [phone]   09/08/2012 8:19 AM

## 2012-09-08 NOTE — Progress Notes (Signed)
Dressing changed to rt. Posterior leg and packed with idoform gauze,

## 2012-09-08 NOTE — Progress Notes (Signed)
PULMONARY  / CRITICAL CARE MEDICINE  Name: Victor Durham MRN: [id number] DOB: 1956/03/21    ADMISSION DATE:  08/31/2012 CONSULTATION DATE:  09/03/2012  REFERRING MD :  FPTS  CHIEF COMPLAINT:  Hypercarbic respiratory failure  BRIEF PATIENT DESCRIPTION: 55 yowm with myasthenia gravis and chronic PEG/ tracheostomy admitted 3/23 with RLE cellulitis and G+ bacteremia.  Became obtunded on 3/26 and ABG reveled profound hypercarbia / acidosis.  Transferred to ICU.  SIGNIFICANT EVENTS / STUDIES:  3/23  Admitted with RLE cellulitis 3/24  RLL MRI >>> Focal subcutaneous fat soft tissue ulceration with a 13 mm pocket of pus at the inferior lateral aspect of the ulceration. No deep abscesses or myositis or osteomyelitis.  Circumferential soft tissue edema in the right lower leg which could represent cellulitis 3/26  Developed acute hypercarbic respiratory failure, transferred to ICU, cuffed tracheostomy tube placed, mechanical ventilation instituted 3/25  2D echo >>>  Poor quality for evaluation of endocarditis 3/26  Head CT >>>Neg 3/26 rx  IVIG 3/29 AM  D/c vent   LINES / TUBES: Chronic tracheostomy Chronic PEG R PICC 3/25 >>>  CULTURES: Abscess 3/23 >>> MRSA Blood 3/23 >>> MRSA x2  [facility] 3/25 x 1 >>> Blood 3/26 x2  >>> Urine 3/27 > neg  ANTIBIOTICS: Vancomycin 3/23 >>>  Subjective: Has been vent free for > 48h interacting  VITAL SIGNS: Temp:  [98 F (36.7 C)-99.3 F (37.4 C)] 98.2 F (36.8 C) (03/31 1224) Pulse Rate:  [71-93] 89 (03/31 1224) Resp:  [12-24] 12 (03/31 1224) BP: (104-133)/(57-87) 110/75 mmHg (03/31 0755) SpO2:  [93 %-100 %] 94 % (03/31 1224) FiO2 (%):  [40 %] 40 % (03/31 1230) Weight:  [69 kg (152 lb 1.9 oz)] 69 kg (152 lb 1.9 oz) (03/31 0448)       VENTILATOR SETTINGS: Vent Mode:  [-]  FiO2 (%):  [40 %] 40 %  INTAKE / OUTPUT: Intake/Output     03/30 0701 - 03/31 0700 03/31 0701 - 04/01 0700   I.V. (mL/kg) 1424 (20.6)    Other 1120    NG/GT 600    IV Piggyback 150    Total Intake(mL/kg) 3294 (47.7)    Urine (mL/kg/hr) 800 (0.5)    Stool     Total Output 800     Net +2494          Stool Occurrence  1 x    PHYSICAL EXAMINATION: General: Alert and interactive. Neuro:  Follows command, cough/gag normalized. HEENT:  PERRL, tracheostomy intact. Cardiovascular:  RRR, no m/r/g. Lungs:  Bilateral diminished air entry, no w/r/r. Abdomen:  Soft, nontender, bowel sounds nl, tol peg feeding  Musculoskeletal:  Edematous / erythematous RLE. Skin:  Sacral decub ulcer.  LABS:  Recent Labs Lab 09/02/12 2337 09/03/12 0125  09/03/12 1238  09/05/12 0441 09/06/12 0430 09/07/12 0500 09/08/12 0645  HGB  --   --   < >  --   < > 12.4* 13.0 13.5 13.0  WBC  --   --   < >  --   < > 7.7 6.2 7.4 7.5  PLT  --   --   < >  --   < > 174 208 215 229  NA  --   --   < >  --   < > 136 136 131* 136  K  --   --   < >  --   < > 3.4* 3.7 3.5 3.7  CL  --   --   < >  --   < >  96 94* 91* 98  CO2  --   --   < >  --   < > 34* 32 33* 32  GLUCOSE  --   --   < >  --   < > 90 107* 117* 103*  BUN  --   --   < >  --   < > 20 26* 25* 25*  CREATININE  --   --   < >  --   < > 0.56 0.58 0.53 0.53  CALCIUM  --   --   < >  --   < > 8.8 8.9 8.9 8.8  MG  --   --   --   --   < > 1.9 2.4 2.3  --   PHOS  --   --   --   --   < > 2.9 2.8 2.9  --   INR  --   --   < >  --   < > 1.89* 2.01* 1.66* 1.71*  PHART 7.172* 7.476*  --  7.479*  --   --   --   --   --   PCO2ART 107.0* 47.5*  --  46.3*  --   --   --   --   --   PO2ART 148.0* 49.2*  --  67.4*  --   --   --   --   --   < > = values in this interval not displayed.  Recent Labs Lab 09/07/12 2002 09/07/12 2338 09/08/12 0424 09/08/12 0756 09/08/12 1223  GLUCAP 120* 97 105* 104* 97   CXR:   3/27> Some improvement right basilar aeration. No other change.   ASSESSMENT / PLAN:  PULMONARY A:  Acute on chronic hypercarbic respiratory failure in setting of myasthenic crisis related to acute infection. P:    tol tcollar  since am 3/29 - would have speech rx assess for cuff down and PMV    CARDIOVASCULAR A: Suspicion for MRSA endocarditis P:  - Goal MAP>60 - TEE pending but will likely not be done until patient is more stable from a neurologic standpoint.  TTE was insufficient to evaluate for endocarditis. - ASA.  RENAL A:  No active issues.  Fluid overload. P:   - Trend BMP.    GASTROINTESTINAL A:  No active issues. P:   - Continue TF per nutritionist recommendations. - Protonix for GI Px.  HEMATOLOGIC A:  History of DVT. P:  - Coumadin per pharmacy. - Trend CBC / INR.  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 P:   - Cultures and antibiotics as per dashboard    NEUROLOGIC A:  MG.  Likely myasthenia crisis exacerbated by acute infection. - Head CT negative 3/26 P:   - Prednisone and IVIg for MG per neuro, improving  - Pyridostigmine. - Neurology consult appreciated. - IV Ig given. - Fentanyl PRN.  Levy Pupa, MD, PhD 09/08/2012, 1:48 PM [facility] [phone] or if no answer [phone]

## 2012-09-08 NOTE — Progress Notes (Signed)
I examined this patient and discussed the care plan with Dr Adriana Simas and the [facility] team and agree with assessment and plan as documented in the progress note above. He could not tolerate capping the Pasy Muir Valve long enough for me to understand what he was saying. When asked to write, he wrote "I hope I can go home with my father.

## 2012-09-08 NOTE — Discharge Summary (Signed)
Family Medicine Teaching [facility] Discharge Summary  Patient name: Victor Durham Medical record number: [id number] Date of birth: Feb 05, 1956 Age: 57 y.o. Gender: male Date of Admission: 08/31/2012  Date of Discharge: 09/20/12 Admitting Physician: Nestor Ramp, MD  Primary Care Provider: No primary provider on file.  Indication for Hospitalization: SOB, Leg pain  Discharge Diagnoses:  Principal Problem:   Cellulitis and abscess of leg Active Problems:   Myasthenia gravis   History of DVT (deep vein thrombosis)   Tracheostomy status   Acute-on-chronic respiratory failure   MRSA bacteremia   Abscess of back   Abscess, sacrum   Myasthenic crisis   G tube feedings  Brief Hospital Course:  57 yo M with PMH of myasthenia gravis (recently requiring extended hospitalization with PEG and trach), HTN, DVT and HLD who presented to the ED from SNF for complaint of "shortness of breath." Upon examination, patient was found to have RLE cellulitis and a superficial abscess.  # Cellulitis and abscess of leg,  Abscess of back and sacrum, with MRSA bacteremia - On admission patient found to have RLE cellulitis and abscess.  Patient later found to have additional abscesses.  - Wound culture as well as blood cultures were obtained. Patient was started on empiric Vancomycin and Zosyn while awaiting culture results.  Wound care was consulted for care/dressing of patients wounds. - Culture results were positive for MRSA.  Vancomycin was continued and Zosyn was discontinued.  MRI of lower extremity was obtained to rule out underlying deep abscess/osteomyelitis/myositis (for results see below). - Given MRSA bacteremia, Infectious disease was consulted. - Patient underwent TTE to assess for underlying endocarditis. However, study was very limited and was study was inadequate for evaluation for endocarditis.  Patient subsequently underwent TEE on 09/08/12.   -PICC line was placed after repeat blood  cultures were negative.  Patient received started on Vancomycin on 08/31/12. Per ID recommendations, patient will continue IV Vancomycin with a last date on 09/30/12.   # Myasthenia gravis and Myasthenic crisis - Patient was initially continued on home Prednisone and Pyridostigmine. - In the setting of MRSA bacteremia Prednisone was increased (on 3/25) to 70 mg daily. - On 3/26, patient developed worsening neurological status and acute hypercarbic respiratory failure (ABG - pH 7.172, PCO2 107, PO2 148, Bicarb 37.5). - CCM was then consulted on patient was transferred to the ICU for ventilator support. - Neurology was also consulted and patient was started on IV IG.  - Patient completed 5 day course of IV IG.- Respiratory status improved following continued treatment of bacteremia and IV IG.  Patient was transferred out of ICU on 3/29. - Upon discharge, the patient should continue prednisone at 40 mg daily and pyridostigmine 90 mg daily.     # Acute on chronic respiratory failure - Patient is trach dependent. - On 3/26, patient developed worsening neurological status and acute hypercarbic respiratory failure.  Patient was transferred tot he ICU and given ventilator support after cuffed trach tube was placed. Patient improved and was slowly weaned off ventilator support. - Patient was then transferred out of ICU and placed back on trach collar at 40% FIO2 at 10L/min - Patient remained stable during remainder of hospitalization  # History of DVT - Patient was continued on coumadin (per pharmacy)  # PEG tube  - Patient received feedings via peg tube during admission with the aid of Nutrition.  Significant Labs and Imaging:   CBC BMET   Recent Labs Lab 09/18/12 0512 09/18/12  1738 09/19/12 0500 09/20/12 0500  WBC 6.0  --  5.5 6.5  HGB 11.4* 11.9* 11.0* 10.9*  HCT 33.4* 35.0* 34.1* 31.9*  PLT 202  --  210 193    Recent Labs Lab 09/18/12 0512 09/18/12 1738 09/19/12 0500 09/20/12 0500   NA 137 140 139 137  K 3.2* 3.8 3.5 3.4*  CL 101 102 106 102  CO2 32  --  29 30  BUN 15 12 15 15   CREATININE 0.45* 0.60 0.50 0.45*  GLUCOSE 97 96 96 102*  CALCIUM 8.8  --  8.8 8.8     Results for orders placed during the hospital encounter of 08/31/12  CULTURE, BLOOD (ROUTINE X 2)     Status: None   Collection Time    08/31/12 11:07 AM      Result Value Range Status   Specimen Description BLOOD RIGHT ARM   Final   Special Requests BOTTLES DRAWN AEROBIC AND ANAEROBIC 10CC   Final   Culture  Setup Time 08/31/2012 20:34   Final   Culture     Final   Value: STAPHYLOCOCCUS AUREUS     Note: SUSCEPTIBILITIES PERFORMED ON PREVIOUS CULTURE WITHIN THE LAST 5 DAYS.     Note: Gram Stain Report Called to,Read Back By and Verified With: PAULINE COLQUHOUN 09/01/12 0830 BY SMITHERSJ   Report Status 09/03/2012 FINAL   Final  CULTURE, BLOOD (ROUTINE X 2)     Status: None   Collection Time    08/31/12 11:14 AM      Result Value Range Status   Specimen Description BLOOD RIGHT HAND   Final   Special Requests BOTTLES DRAWN AEROBIC AND ANAEROBIC [facility]   Final   Culture  Setup Time 08/31/2012 20:34   Final   Culture     Final   Value: METHICILLIN RESISTANT STAPHYLOCOCCUS AUREUS     Note: RIFAMPIN AND GENTAMICIN SHOULD NOT BE USED AS SINGLE DRUGS FOR TREATMENT OF STAPH INFECTIONS. CRITICAL RESULT CALLED TO, READ BACK BY AND VERIFIED WITH: LISA JOHNSON @ 1352 09/02/12 BY KRAWS This organism DOES NOT demonstrate inducible Clindamycin      resistance in vitro.     Note: Gram Stain Report Called to,Read Back By and Verified With: PAULINE COLQUHOUN 09/01/12 0830 BY SMITHERSJ   Report Status 09/03/2012 FINAL   Final   Organism ID, Bacteria METHICILLIN RESISTANT STAPHYLOCOCCUS AUREUS   Final  CULTURE, ROUTINE-ABSCESS     Status: None   Collection Time    08/31/12 12:07 PM      Result Value Range Status   Specimen Description ABSCESS RIGHT LOWER LEG   Final   Special Requests NONE   Final   Gram Stain      Final   Value: RARE WBC PRESENT,BOTH PMN AND MONONUCLEAR     NO SQUAMOUS EPITHELIAL CELLS SEEN     FEW GRAM POSITIVE COCCI     IN PAIRS IN CLUSTERS   Culture     Final   Value: MODERATE METHICILLIN RESISTANT STAPHYLOCOCCUS AUREUS     Note: RIFAMPIN AND GENTAMICIN SHOULD NOT BE USED AS SINGLE DRUGS FOR TREATMENT OF STAPH INFECTIONS. This organism DOES NOT demonstrate inducible Clindamycin resistance in vitro. CRITICAL RESULT CALLED TO, READ BACK BY AND VERIFIED WITH: GENEVIEVE RN BY      INGRAM A 3/26 840AM   Report Status 09/03/2012 FINAL   Final   Organism ID, Bacteria METHICILLIN RESISTANT STAPHYLOCOCCUS AUREUS   Final  MRSA PCR SCREENING     Status:  None   Collection Time    08/31/12  5:22 PM      Result Value Range Status   MRSA by PCR NEGATIVE  NEGATIVE Final   Comment:            The GeneXpert MRSA Assay (FDA     approved for NASAL specimens     only), is one component of a     comprehensive MRSA colonization     surveillance program. It is not     intended to diagnose MRSA     infection nor to guide or     monitor treatment for     MRSA infections.  CULTURE, BLOOD (SINGLE)     Status: None   Collection Time    09/02/12  4:48 PM      Result Value Range Status   Specimen Description BLOOD RIGHT ANTECUBITAL   Final   Special Requests BOTTLES DRAWN AEROBIC ONLY 5CC   Final   Culture  Setup Time 09/02/2012 22:23   Final   Culture NO GROWTH 5 DAYS   Final   Report Status 09/08/2012 FINAL   Final  CULTURE, BLOOD (ROUTINE X 2)     Status: None   Collection Time    09/03/12  4:30 AM      Result Value Range Status   Specimen Description BLOOD LEFT ARM   Final   Special Requests BOTTLES DRAWN AEROBIC AND ANAEROBIC 10CC    Final   Culture  Setup Time 09/03/2012 08:52   Final   Culture NO GROWTH 5 DAYS   Final   Report Status 09/09/2012 FINAL   Final  CULTURE, BLOOD (ROUTINE X 2)     Status: None   Collection Time    09/03/12  4:40 AM      Result Value Range Status    Specimen Description BLOOD LEFT HAND   Final   Special Requests BOTTLES DRAWN AEROBIC AND ANAEROBIC 10CC   Final   Culture  Setup Time 09/03/2012 08:52   Final   Culture NO GROWTH 5 DAYS   Final   Report Status 09/09/2012 FINAL   Final  URINE CULTURE     Status: None   Collection Time    09/04/12  9:02 PM      Result Value Range Status   Specimen Description URINE, CATHETERIZED   Final   Special Requests NONE   Final   Culture  Setup Time 09/05/2012 03:03   Final   Colony Count NO GROWTH   Final   Culture NO GROWTH   Final   Report Status 09/06/2012 FINAL   Final   Ct Head Wo Contrast  09/03/2012 *RADIOLOGY REPORT* Clinical Data: Acute mental status changes. CT HEAD WITHOUT CONTRAST Technique: Contiguous axial images were obtained from the base of the skull through the vertex without contrast. Comparison: MRI of 07/11/2012. CT 07/10/2012. Findings: No mass lesion, mass effect, midline shift, hydrocephalus, hemorrhage. No territorial ischemia or acute infarction. Bilateral mastoid effusions are present, greater on the left than right, chronic. Intracranial atherosclerosis is noted. Scattered areas of low attenuation are present in the periventricular white matter, also demonstrated on prior MRI. IMPRESSION: No interval change or acute intracranial abnormality.   Mr Tibia Fibula Right Wo Contrast  09/01/2012 *RADIOLOGY REPORT* Clinical Data: Nonhealing soft tissue ulcer in the posterior aspect of the right calf. MRI OF LOWER RIGHT EXTREMITY WITHOUT CONTRAST Technique: Multiplanar, multisequence MR imaging of the lower right extremity was performed. No intravenous contrast was administered.  Comparison: None. Findings: There is a focal soft tissue ulceration of the posterior aspect of the mid right calf. The ulceration involves only the subcutaneous fat. There is a small fluid collection at the inferior lateral aspect of the ulceration which could represent a 13 mm in diameter collection of pus. No  other abscesses. The underlying muscles are normal. The tibia and fibula are normal other than minimal degenerative changes at the tibial plafond at the ankle. There is diffuse slight subcutaneous edema circumferentially in the right lower leg which could represent cellulitis. IMPRESSION: 1. Focal subcutaneous fat soft tissue ulceration with a 13 mm pocket of pus at the inferior lateral aspect of the ulceration. 2. No deep abscesses or myositis or osteomyelitis. 3. Circumferential soft tissue edema in the right lower leg which could represent cellulitis.   Dg Chest Port 1 View  09/04/2012 *RADIOLOGY REPORT* Clinical Data: Respiratory failure. PORTABLE CHEST - 1 VIEW Comparison: Chest 09/02/2012 and 09/01/2012. Findings: Aeration in the right lung base appears somewhat improved. Left lung is clear. No pneumothorax is identified. Heart size is normal. IMPRESSION: Some improvement right basilar aeration. No other change.   Dg Chest Port 1 View  09/02/2012 *RADIOLOGY REPORT* Clinical Data: Line placement. PORTABLE CHEST - 1 VIEW Comparison: 09/01/2012. Findings: Tracheostomy is unchanged. Right upper extremity PICC is present with the tip at the cavoatrial junction. The lung volumes are low. Pulmonary aeration is worse than on the prior exam, with bilateral basilar atelectasis. Cardiopericardial silhouette remains enlarged with tortuous thoracic aorta. IMPRESSION: 1. New right upper extremity PICC with the tip at the cavoatrial junction. 2. Low volume chest with bilateral basilar atelectasis.   Portable Chest 1 View  09/01/2012 *RADIOLOGY REPORT* Clinical Data: Shortness of breath PORTABLE CHEST - 1 VIEW Comparison: 08/31/2012 Findings: A tracheostomy tube is again seen. The cardiac shadow is stable. The lungs are well-aerated bilaterally. Minimal left basilar atelectasis is seen. IMPRESSION: No left basilar atelectasis.   Dg Chest Port 1 View  08/31/2012 *RADIOLOGY REPORT* Clinical Data: Chest pain PORTABLE  CHEST - 1 VIEW Comparison: 08/07/2012 Findings: Tracheostomy device stable position. Patchy subsegmental atelectasis or infiltrates in both lung bases, slightly improved since previous exam. No effusion. Heart size normal. Regional bones unremarkable. IMPRESSION: Slight improvement in bibasilar atelectasis or infiltrates.   Dg Chest Portable 1 View  08/07/2012 *RADIOLOGY REPORT* Clinical Data: Shortness of breath. Tracheostomy. PORTABLE CHEST - 1 VIEW Comparison: 08/03/2012 Findings: Patient positioning suggest kyphosis resulting in reverse lordotic appearance. Tracheostomy tube is visualized but location cannot be determined based on technique. The heart size and pulmonary vascularity look normal. There is a residual linear infiltration or atelectasis in the lung bases bilaterally. No blunting of costophrenic angles. No pneumothorax. IMPRESSION: Linear infiltration or atelectasis in the lung bases. Tracheostomy. Positioning of the endotracheal tube is difficult to determine due to patient positioning.  Procedures: Transesophageal echocardiogram  Consultations: Infectious Disease, Critical care medicine, Neurology  Discharge Exam:   Temp:  [98 F (36.7 C)-98.2 F (36.8 C)] 98.1 F (36.7 C) (04/12 1357) Pulse Rate:  [62-72] 72 (04/12 1357) Resp:  [18-19] 18 (04/12 1357) BP: (91-99)/(50-58) 99/58 mmHg (04/12 1357) SpO2:  [98 %-100 %] 99 % (04/12 1357) FiO2 (%):  [28 %] 28 % (04/12 1123) Weight:  [153 lb 8 oz (69.627 kg)] 153 lb 8 oz (69.627 kg) (04/12 0450) General: resting comfortably in bed. Cooperative through exams.  Eyes: Left lid ptosis L >R  Heart: RRR. No murmurs, rubs, or gallops.  Lungs: Normal WOB, mild  crackles bilateral bases.  Abd: PEG in place with feeds. Site clean/dry/intact., S/NT/ND  Back: Dressing in place. Clean, dry, intact.  Extremities: RLE - dressing in place; clean, dry and intact.  Neuro: moves all extremities, moves all 4 appendages easily    Discharge  Medications:    Medication List    TAKE these medications       albuterol (5 MG/ML) 0.5% nebulizer solution  Commonly known as:  PROVENTIL  Take 2.5 mg by nebulization every 8 (eight) hours. Scheduled dose, trach care     ALPRAZolam 0.5 MG tablet  Commonly known as:  XANAX  Give 0.5 mg by tube every 3 (three) hours as needed for anxiety.     aspirin 81 MG chewable tablet  Give 81 mg by tube daily.     chlorhexidine 0.12 % solution  Commonly known as:  PERIDEX  Use as directed 15 mLs in the mouth or throat 6 (six) times daily. Swab mouth at 10am and 10pm  Rinse mouth 0600, 1200, 1800, and 0001     Fibersource HN Liqd  Give 70 mL/hr by tube continuous.     predniSONE 20 MG tablet  Commonly known as:  DELTASONE  Place 2 tablets (40 mg total) into feeding tube daily with breakfast.     PROTONIX 40 mg/20 mL Pack  Generic drug:  pantoprazole sodium  Place 40 mg into feeding tube daily.     pyridostigmine 60 MG/5ML syrup  Commonly known as:  MESTINON  Give 90 mg by tube 5 (five) times daily. 7.73ml     sodium chloride 0.9 % SOLN 150 mL with vancomycin 1000 MG SOLR 750 mg  Inject 750 mg into the vein every 8 (eight) hours. Vancomycin trough due 4/15. Goal level 15-20.     sterile water for irrigation  Give 200 mLs by tube every 4 (four) hours.     traZODone 100 MG tablet  Commonly known as:  DESYREL  Give 100 mg by tube at bedtime.     warfarin 7.5 MG tablet  Commonly known as:  COUMADIN  Give 7.5 mg by tube every Tuesday, Thursday, Saturday, and Sunday at 6 PM.     warfarin 5 MG tablet  Commonly known as:  COUMADIN  Take 5 mg by mouth every Monday, Wednesday, and Friday.       Issues for Follow Up:  1) Completion of antibiotic course for MRSA Bacteremia 2) Patient will need continued wound care  Outstanding Results: None  Discharge Instructions:  Patient was counseled important signs and symptoms that should prompt return to medical care, changes in  medications, dietary instructions, activity restrictions, and follow up appointments.   Follow-up Information   Schedule an appointment as soon as possible for a visit to follow up.      Discharge Condition: Stable. Discharged to SNF - [facility].  Si Raider Clinton Sawyer, MD, MBA 09/20/2012, 3:08 PM Family Medicine Resident, PGY-2 [phone] pager

## 2012-09-08 NOTE — Progress Notes (Signed)
[facility]  Date of Admission:  08/31/2012  Antibiotics: Vancomycin day 9  Subjective: No complaints (communicates by noding, thumbs up)  Objective: Temp:  [98 F (36.7 C)-99.3 F (37.4 C)] 98.3 F (36.8 C) (03/31 0755) Pulse Rate:  [71-128] 74 (03/31 0755) Resp:  [17-24] 20 (03/31 0755) BP: (104-133)/(57-87) 110/75 mmHg (03/31 0755) SpO2:  [93 %-100 %] 100 % (03/31 0755) FiO2 (%):  [35 %-40 %] 40 % (03/31 0755) Weight:  [152 lb 1.9 oz (69 kg)] 152 lb 1.9 oz (69 kg) (03/31 0448)  General: Awake and alert, responds appropriately to questions, now appears at presumed baseline, trach collar in place Skin: no rashes Lungs: diffuse rhonchi, no crackles noted Cor: RRR without m/r/g Abdomen: soft, nt, nd, +bs, no HSM   Lab Results Lab Results  Component Value Date   WBC 7.5 09/08/2012   HGB 13.0 09/08/2012   HCT 39.4 09/08/2012   MCV 89.7 09/08/2012   PLT 229 09/08/2012    Lab Results  Component Value Date   CREATININE 0.53 09/08/2012   BUN 25* 09/08/2012   NA 136 09/08/2012   K 3.7 09/08/2012   CL 98 09/08/2012   CO2 32 09/08/2012    Lab Results  Component Value Date   ALT 44 09/01/2012   AST 15 09/01/2012   ALKPHOS 86 09/01/2012   BILITOT 0.8 09/01/2012      Microbiology: Recent Results (from the past 240 hour(s))  CULTURE, BLOOD (ROUTINE X 2)     Status: None   Collection Time    08/31/12 11:07 AM      Result Value Range Status   Specimen Description BLOOD RIGHT ARM   Final   Special Requests BOTTLES DRAWN AEROBIC AND ANAEROBIC 10CC   Final   Culture  Setup Time 08/31/2012 20:34   Final   Culture     Final   Value: STAPHYLOCOCCUS AUREUS     Note: SUSCEPTIBILITIES PERFORMED ON PREVIOUS CULTURE WITHIN THE LAST 5 DAYS.     Note: Gram Stain Report Called to,Read Back By and Verified With: PAULINE COLQUHOUN 09/01/12 0830 BY SMITHERSJ   Report Status 09/03/2012 FINAL   Final  CULTURE, BLOOD (ROUTINE X 2)     Status: None   Collection Time   08/31/12 11:14 AM      Result Value Range Status   Specimen Description BLOOD RIGHT HAND   Final   Special Requests BOTTLES DRAWN AEROBIC AND ANAEROBIC [facility]   Final   Culture  Setup Time 08/31/2012 20:34   Final   Culture     Final   Value: METHICILLIN RESISTANT STAPHYLOCOCCUS AUREUS     Note: RIFAMPIN AND GENTAMICIN SHOULD NOT BE USED AS SINGLE DRUGS FOR TREATMENT OF STAPH INFECTIONS. CRITICAL RESULT CALLED TO, READ BACK BY AND VERIFIED WITH: LISA JOHNSON @ 1352 09/02/12 BY KRAWS This organism DOES NOT demonstrate inducible Clindamycin      resistance in vitro.     Note: Gram Stain Report Called to,Read Back By and Verified With: PAULINE COLQUHOUN 09/01/12 0830 BY SMITHERSJ   Report Status 09/03/2012 FINAL   Final   Organism ID, Bacteria METHICILLIN RESISTANT STAPHYLOCOCCUS AUREUS   Final  CULTURE, ROUTINE-ABSCESS     Status: None   Collection Time    08/31/12 12:07 PM      Result Value Range Status   Specimen Description ABSCESS RIGHT LOWER LEG   Final   Special Requests NONE   Final   Gram Stain  Final   Value: RARE WBC PRESENT,BOTH PMN AND MONONUCLEAR     NO SQUAMOUS EPITHELIAL CELLS SEEN     FEW GRAM POSITIVE COCCI     IN PAIRS IN CLUSTERS   Culture     Final   Value: MODERATE METHICILLIN RESISTANT STAPHYLOCOCCUS AUREUS     Note: RIFAMPIN AND GENTAMICIN SHOULD NOT BE USED AS SINGLE DRUGS FOR TREATMENT OF STAPH INFECTIONS. This organism DOES NOT demonstrate inducible Clindamycin resistance in vitro. CRITICAL RESULT CALLED TO, READ BACK BY AND VERIFIED WITH: GENEVIEVE RN BY      INGRAM A 3/26 840AM   Report Status 09/03/2012 FINAL   Final   Organism ID, Bacteria METHICILLIN RESISTANT STAPHYLOCOCCUS AUREUS   Final  MRSA PCR SCREENING     Status: None   Collection Time    08/31/12  5:22 PM      Result Value Range Status   MRSA by PCR NEGATIVE  NEGATIVE Final   Comment:            The GeneXpert MRSA Assay (FDA     approved for NASAL specimens     only), is one component of a      comprehensive MRSA colonization     surveillance program. It is not     intended to diagnose MRSA     infection nor to guide or     monitor treatment for     MRSA infections.  CULTURE, BLOOD (SINGLE)     Status: None   Collection Time    09/02/12  4:48 PM      Result Value Range Status   Specimen Description BLOOD RIGHT ANTECUBITAL   Final   Special Requests BOTTLES DRAWN AEROBIC ONLY 5CC   Final   Culture  Setup Time 09/02/2012 22:23   Final   Culture NO GROWTH 5 DAYS   Final   Report Status 09/08/2012 FINAL   Final  CULTURE, BLOOD (ROUTINE X 2)     Status: None   Collection Time    09/03/12  4:30 AM      Result Value Range Status   Specimen Description BLOOD LEFT ARM   Final   Special Requests BOTTLES DRAWN AEROBIC AND ANAEROBIC 10CC    Final   Culture  Setup Time 09/03/2012 08:52   Final   Culture     Final   Value:        BLOOD CULTURE RECEIVED NO GROWTH TO DATE CULTURE WILL BE HELD FOR 5 DAYS BEFORE ISSUING A FINAL NEGATIVE REPORT   Report Status PENDING   Incomplete  CULTURE, BLOOD (ROUTINE X 2)     Status: None   Collection Time    09/03/12  4:40 AM      Result Value Range Status   Specimen Description BLOOD LEFT HAND   Final   Special Requests BOTTLES DRAWN AEROBIC AND ANAEROBIC 10CC   Final   Culture  Setup Time 09/03/2012 08:52   Final   Culture     Final   Value:        BLOOD CULTURE RECEIVED NO GROWTH TO DATE CULTURE WILL BE HELD FOR 5 DAYS BEFORE ISSUING A FINAL NEGATIVE REPORT   Report Status PENDING   Incomplete  URINE CULTURE     Status: None   Collection Time    09/04/12  9:02 PM      Result Value Range Status   Specimen Description URINE, CATHETERIZED   Final   Special Requests NONE   Final  Culture  Setup Time 09/05/2012 03:03   Final   Colony Count NO GROWTH   Final   Culture NO GROWTH   Final   Report Status 09/06/2012 FINAL   Final    Studies/Results: No results found.  Assessment/Plan: 1) MRSA bacteremia -  Source leg abscess.  TTE done  without any vegetation noted. -TEE today  Staci Righter, MD [facility] [phone] pager   09/08/2012, 10:53 AM

## 2012-09-08 NOTE — Progress Notes (Signed)
NUTRITION FOLLOW UP  Intervention:   1.  Enteral nutrition; continue current regimen of Jevity 1.2 @ 70 mL/hr and decrease Prostat to BID to provide 2216 kcal, 124g protein, 1356 ml free water.  Nutrition Dx:   Inadequate oral intake, ongoing  Goal:  Intake to meet >90% of estimated nutrition needs.   Monitor:  weight trends, lab trends, I/O's, TF tolerance   Assessment:   Pt admitted with SOB and abscesses.  Pt with h/o myastenia gravis requiring trach and PEG.  Pt initially resumed home regimen, however required changes while intubated.  Currently on trach collar.  Pt in with SLP for PMSV assessment at time of visit.  Regimen has been resumed at Jevity 1.2 @ 70 ml/hr continuous with Prostat TID which exceeds pt's estimated needs.  Residuals: 5 mL at last check.    Height: Ht Readings from Last 1 Encounters:  09/03/12 6' (1.829 m)    Weight Status:   Wt Readings from Last 1 Encounters:  09/08/12 152 lb 1.9 oz (69 kg)    Re-estimated needs:  Kcal: 2000-2200 Protein: 90-110g Fluid: 2 L/day  Skin: Stage 2 to sacrum  Diet Order: NPO   Intake/Output Summary (Last 24 hours) at 09/08/12 1443 Last data filed at 09/08/12 0700  Gross per 24 hour  Intake   2204 ml  Output    100 ml  Net   2104 ml    Last BM: 3/29   Labs:   Recent Labs Lab 09/05/12 0441 09/06/12 0430 09/07/12 0500 09/08/12 0645  NA 136 136 131* 136  K 3.4* 3.7 3.5 3.7  CL 96 94* 91* 98  CO2 34* 32 33* 32  BUN 20 26* 25* 25*  CREATININE 0.56 0.58 0.53 0.53  CALCIUM 8.8 8.9 8.9 8.8  MG 1.9 2.4 2.3  --   PHOS 2.9 2.8 2.9  --   GLUCOSE 90 107* 117* 103*    CBG (last 3)   Recent Labs  09/08/12 0424 09/08/12 0756 09/08/12 1223  GLUCAP 105* 104* 97    Scheduled Meds: . antiseptic oral rinse  15 mL Mouth Rinse QID  . aspirin  81 mg Per Tube Daily  . chlorhexidine  15 mL Mouth Rinse BID  . feeding supplement  30 mL Per Tube TID WC  . insulin aspart  0-15 Units Subcutaneous Q4H  .  pantoprazole sodium  40 mg Per Tube Q1200  . predniSONE  70 mg Per Tube Q breakfast  . pyridostigmine  90 mg Per Tube 5 X Daily  . sodium chloride  3 mL Intravenous Q12H  . sterile water for irrigation  200 mL Irrigation Q4H  . vancomycin  750 mg Intravenous Q8H  . warfarin  7.5 mg Oral ONCE-1800  . Warfarin - Pharmacist Dosing Inpatient   Does not apply q1800    Continuous Infusions: . sodium chloride    . sodium chloride 20 mL/hr at 09/07/12 2334  . dextrose 5 % and 0.9% NaCl 100 mL/hr at 09/07/12 2335  . feeding supplement (JEVITY 1.2 CAL) 1,000 mL (09/07/12 1529)    Loyce Dys, MS RD LDN Clinical Inpatient Dietitian Pager: [phone] Weekend/After hours pager: [phone]

## 2012-09-08 NOTE — Progress Notes (Signed)
Clinical Social Worker reviewed chart.  CSW noted pt has transitioned to [facility]; currently at 40%.  Pt will be eligible for return to trach facility at [facility] once he is able to maintain 28% on trach collar.  CSW to continue to follow and assist as needed.    [name], MSW, [place] [phone]

## 2012-09-08 NOTE — Progress Notes (Signed)
Daily Progress Note Family Medicine Teaching Service Service Pager: [phone]  Subjective:  Feeling well this am (thumbs up) Reports that his weakness is improving.  Patient was able to sit up on his own this am.  Objective:  Temp:  [97.4 F (36.3 C)-99.3 F (37.4 C)] 98.1 F (36.7 C) (03/31 0448) Pulse Rate:  [67-128] 79 (03/31 0448) Resp:  [17-24] 23 (03/31 0448) BP: (104-133)/(57-87) 121/82 mmHg (03/31 0448) SpO2:  [93 %-100 %] 99 % (03/31 0448) FiO2 (%):  [35 %-40 %] 40 % (03/31 0448) Weight:  [152 lb 1.9 oz (69 kg)] 152 lb 1.9 oz (69 kg) (03/31 0448)  Intake/Output Summary (Last 24 hours) at 09/08/12 0655 Last data filed at 09/08/12 0449  Gross per 24 hour  Intake   2964 ml  Output    800 ml  Net   2164 ml    Physical Exam: General: awake, resting comfortably in bed, communicates with clipboard or mouthing things  Heart: RRR. No murmurs, rubs, or gallops.  Lungs: CTAB.  Abd: PEG in place with feeds. Site clean/dry/intact. Back: Dressing in place.  Clean, dry, intact.   Extremities: RLE - dressing in place; dry, intact. Neuro: moves all extremities; able to sit up unassisted this am.   Labs and imaging:  CBC  Recent Labs Lab 09/05/12 0441 09/06/12 0430 09/07/12 0500  WBC 7.7 6.2 7.4  HGB 12.4* 13.0 13.5  HCT 37.7* 38.4* 40.8  PLT 174 208 215   BMET/CMET  Recent Labs Lab 09/01/12 0912  09/05/12 0441 09/06/12 0430 09/07/12 0500  NA 137  < > 136 136 131*  K 4.4  < > 3.4* 3.7 3.5  CL 98  < > 96 94* 91*  CO2 34*  < > 34* 32 33*  BUN 15  < > 20 26* 25*  CREATININE 0.46*  < > 0.56 0.58 0.53  CALCIUM 9.3  < > 8.8 8.9 8.9  PROT 7.4  --   --   --   --   BILITOT 0.8  --   --   --   --   ALKPHOS 86  --   --   --   --   ALT 44  --   --   --   --   AST 15  --   --   --   --   GLUCOSE 86  < > 90 107* 117*  < > = values in this interval not displayed.  Scheduled Medications: . antiseptic oral rinse  15 mL Mouth Rinse QID  . aspirin  81 mg Per Tube Daily   . chlorhexidine  15 mL Mouth Rinse BID  . feeding supplement  30 mL Per Tube TID WC  . Immune Globulin 5%  400 mg/kg Intravenous Q24 Hr x 5  . insulin aspart  0-15 Units Subcutaneous Q4H  . pantoprazole sodium  40 mg Per Tube Q1200  . predniSONE  70 mg Per Tube Q breakfast  . pyridostigmine  90 mg Per Tube 5 X Daily  . sodium chloride  3 mL Intravenous Q12H  . sterile water for irrigation  200 mL Irrigation Q4H  . vancomycin  750 mg Intravenous Q8H  . Warfarin - Pharmacist Dosing Inpatient   Does not apply q1800   Continuous Infusions: . sodium chloride    . sodium chloride 20 mL/hr at 09/07/12 2334  . dextrose 5 % and 0.9% NaCl 100 mL/hr at 09/07/12 2335  . feeding supplement (JEVITY 1.2 CAL) 1,000  mL (09/07/12 1529)   PRN Medications:sodium chloride, acetaminophen, acetaminophen, albuterol, fentaNYL, ondansetron (ZOFRAN) IV, ondansetron, sodium chloride, sodium chloride  Assessment  57 yo M with RLE cellulitis and multiple abscesses as well as MRSA bacteremia now with associated myasthenic crisis.   # Myasthenic Crisis - thought related to increased prednisone abruptly (given for stress dose) but possibly due to infection given crisis started <12 hours after admin high dose prednisone and typically 5-10 days after increased dose of prednisone. .  - Neurology following and we greatly appreciate their help in managing this patient.  - Now s/p IVIG x 4 days (today in last day of treatment to complete 5 day course) and continued on high dose prednisone 70mg  in addition to pyridostigmine.  Patient may need plasma exchange.  #MRSA bacteremia-likely source RLE cellulitis and multiple abscess. MRI tib/fib showed cellulitis, with no infiltration to muscle/bone.  - ID following and we greatly appreciate their help in managing this patient. - Patient to go for TEE today - Will continue Vanc per pharmacy (Day 8 of Vanc)  # H/o DVT - Coumadin per pharmacy.   # Trach dependency - Will  continue current settings. - SLP consulted for Passy Muir valve assessment  FEN/GI -  Currently NPO. Will resume Peg feeds following TEE today.  Ppx- coumadin per pharmacy. Dispo - pending continued clinical improvement. When stable will be discharged to [facility].   Code: FULL code Contact: Jabari Swoveland, father: [phone]   Everlene Other, DO Family Medicine PGY-1

## 2012-09-09 ENCOUNTER — Ambulatory Visit (HOSPITAL_COMMUNITY): Admit: 2012-09-09 | Payer: Self-pay | Admitting: Internal Medicine

## 2012-09-09 ENCOUNTER — Encounter (HOSPITAL_COMMUNITY)
Admission: EM | Disposition: A | Discharge status: Skilled Nursing Facility | Payer: Self-pay | Source: Home / Self Care | Attending: Family Medicine

## 2012-09-09 ENCOUNTER — Encounter (HOSPITAL_COMMUNITY): Payer: Self-pay

## 2012-09-09 DIAGNOSIS — R7881 Bacteremia: Secondary | ICD-10-CM

## 2012-09-09 DIAGNOSIS — M8618 Other acute osteomyelitis, other site: Secondary | ICD-10-CM

## 2012-09-09 DIAGNOSIS — A4902 Methicillin resistant Staphylococcus aureus infection, unspecified site: Secondary | ICD-10-CM

## 2012-09-09 HISTORY — PX: TEE WITHOUT CARDIOVERSION: SHX5443

## 2012-09-09 LAB — PROTIME-INR: INR: 1.55 — ABNORMAL HIGH (ref 0.00–1.49)

## 2012-09-09 LAB — CULTURE, BLOOD (ROUTINE X 2): Culture: NO GROWTH

## 2012-09-09 LAB — VANCOMYCIN, TROUGH: Vancomycin Tr: 14.4 ug/mL (ref 10.0–20.0)

## 2012-09-09 LAB — CBC
HCT: 36.7 % — ABNORMAL LOW (ref 39.0–52.0)
Hemoglobin: 12.8 g/dL — ABNORMAL LOW (ref 13.0–17.0)
RBC: 4.18 MIL/uL — ABNORMAL LOW (ref 4.22–5.81)
RDW: 15.3 % (ref 11.5–15.5)
WBC: 7 10*3/uL (ref 4.0–10.5)

## 2012-09-09 LAB — COMPREHENSIVE METABOLIC PANEL
Albumin: 2.4 g/dL — ABNORMAL LOW (ref 3.5–5.2)
Alkaline Phosphatase: 68 U/L (ref 39–117)
BUN: 19 mg/dL (ref 6–23)
CO2: 30 mEq/L (ref 19–32)
Chloride: 100 mEq/L (ref 96–112)
GFR calc non Af Amer: >90 mL/min (ref >90)
Glucose, Bld: 100 mg/dL — ABNORMAL HIGH (ref 70–99)
Potassium: 3.7 mEq/L (ref 3.5–5.1)
Total Bilirubin: 1.6 mg/dL — ABNORMAL HIGH (ref 0.3–1.2)

## 2012-09-09 LAB — PREALBUMIN: Prealbumin: 19.5 mg/dL (ref 17.0–34.0)

## 2012-09-09 LAB — GLUCOSE, CAPILLARY
Glucose-Capillary: 103 mg/dL — ABNORMAL HIGH (ref 70–99)
Glucose-Capillary: 81 mg/dL (ref 70–99)

## 2012-09-09 SURGERY — ECHOCARDIOGRAM, TRANSESOPHAGEAL
Anesthesia: Moderate Sedation

## 2012-09-09 MED ORDER — BUTAMBEN-TETRACAINE-BENZOCAINE 2-2-14 % EX AERO
INHALATION_SPRAY | CUTANEOUS | Status: DC | PRN
  Administered 2012-09-09: 2 via TOPICAL

## 2012-09-09 MED ORDER — FENTANYL CITRATE 0.05 MG/ML IJ SOLN
INTRAMUSCULAR | Status: DC | PRN
  Administered 2012-09-09: 50 ug via INTRAVENOUS

## 2012-09-09 MED ORDER — WARFARIN SODIUM 10 MG PO TABS
10.0000 mg | ORAL_TABLET | Freq: Once | ORAL | Status: AC
  Administered 2012-09-09: 10 mg via ORAL
  Filled 2012-09-09: qty 1

## 2012-09-09 MED ORDER — FENTANYL CITRATE 0.05 MG/ML IJ SOLN
INTRAMUSCULAR | Status: AC
  Filled 2012-09-09: qty 2

## 2012-09-09 MED ORDER — MIDAZOLAM HCL 5 MG/ML IJ SOLN
INTRAMUSCULAR | Status: AC
  Filled 2012-09-09: qty 3

## 2012-09-09 MED ORDER — DIPHENHYDRAMINE HCL 50 MG/ML IJ SOLN
INTRAMUSCULAR | Status: AC
  Filled 2012-09-09: qty 1

## 2012-09-09 MED ORDER — LIDOCAINE VISCOUS 2 % MT SOLN
OROMUCOSAL | Status: AC
  Filled 2012-09-09: qty 15

## 2012-09-09 MED ORDER — MIDAZOLAM HCL 10 MG/2ML IJ SOLN
INTRAMUSCULAR | Status: DC | PRN
  Administered 2012-09-09: 2 mg via INTRAVENOUS
  Administered 2012-09-09: 1 mg via INTRAVENOUS
  Administered 2012-09-09: 2 mg via INTRAVENOUS

## 2012-09-09 NOTE — Progress Notes (Signed)
NEURO HOSPITALIST PROGRESS NOTE   SUBJECTIVE:                                                                                                                        No complaints, thumbs up when asked if his breathing is getting easier,  He still has secretions but appear to be better today.   OBJECTIVE:                                                                                                                           Vital signs in last 24 hours: Temp:  [97.8 F (36.6 C)-98.3 F (36.8 C)] 97.8 F (36.6 C) (04/01 0837) Pulse Rate:  [60-89] 71 (04/01 0856) Resp:  [12-27] 18 (04/01 0856) BP: (97-137)/(63-83) 100/64 mmHg (04/01 0856) SpO2:  [94 %-100 %] 99 % (04/01 0856) FiO2 (%):  [40 %] 40 % (04/01 0856)  Intake/Output from previous day: 03/31 0701 - 04/01 0700 In: 1250 [I.V.:800; IV Piggyback:450] Out: 800 [Urine:800] Intake/Output this shift:   Nutritional status: NPO  Past Medical History  Diagnosis Date  . DVT (deep venous thrombosis)   . Hypertension   . High cholesterol   . Myasthenia gravis   . Anxiety   . Dysphagia 06/30/12     Neurologic Exam:  Alert, oriented,follows commands, able to use his hands to communicate. No off ventilator Cranial Nerves:  II: Visual fields grossly normal, pupils equal, round, reactive to light and accommodation  III,IV, VI: ptosis present left eye, extra-ocular motions intact bilaterally  V,VII: smile symmetric, facial light touch sensation normal bilaterally  VIII: hearing normal bilaterally  IX,X: gag reflex present  XI: bilateral shoulder shrug  XII: midline tongue extension  Motor: weakness neck flexors and extensors.  Right :   Upper extremity 5/5   Left:  Upper extremity 5/5    Lower extremity 5/5    Lower extremity 5/5  Tone and bulk:normal tone throughout; no atrophy noted  Sensory: Pinprick and light touch intact throughout, bilaterally  Deep Tendon Reflexes: 2+ and symmetric  throughout UE, 1+ bilateral KJ and no AJ  Plantars:  Mute bilaterally  Cerebellar:  normal finger-to-nose,  CV: pulses palpable throughout    Lab Results: Lab Results  Component  Value Date/Time   CHOL 223* 06/27/2009  8:22 PM   Lipid Panel No results found for this basename: CHOL, TRIG, HDL, CHOLHDL, VLDL, LDLCALC,  in the last 72 hours  Studies/Results: No results found.  MEDICATIONS                                                                                                                        Scheduled: . antiseptic oral rinse  15 mL Mouth Rinse QID  . aspirin  81 mg Per Tube Daily  . chlorhexidine  15 mL Mouth Rinse BID  . feeding supplement  30 mL Per Tube BID  . insulin aspart  0-15 Units Subcutaneous Q4H  . pantoprazole sodium  40 mg Per Tube Q1200  . predniSONE  70 mg Per Tube Q breakfast  . pyridostigmine  90 mg Per Tube 5 X Daily  . sodium chloride  3 mL Intravenous Q12H  . sterile water for irrigation  200 mL Irrigation Q4H  . vancomycin  750 mg Intravenous Q8H  . Warfarin - Pharmacist Dosing Inpatient   Does not apply q1800    ASSESSMENT/PLAN:                                                                                                             57 years old male with known MG admitted with myasthenic crisis in the context of acute infection.  Still on trach collar but off ventilator and breathing on his own.  5 days IVIG now finished.    Will continue his 70 mg Prednisone daily and Mestinon 90 mg 5X daily.    Will continue to follow and watch for improvement following IVIG treatment from afar.   Assessment and plan discussed with with attending physician and they are in agreement.    Felicie Morn PA-C [facility] [phone]  09/09/2012, 10:38 AM

## 2012-09-09 NOTE — Progress Notes (Signed)
ANTICOAGULATION/ANTIBIOTIC CONSULT NOTE - Follow Up Consult  Pharmacy Consult for coumadin and Vancomycin Indication: Recent DVT/MRSA bacteremia  No Known Allergies  Patient Measurements: Height: 6' (182.9 cm) Weight: 152 lb 1.9 oz (69 kg) IBW/kg (Calculated) : 77.6  Vital Signs: Temp: 97.8 F (36.6 C) (04/01 0837) Temp src: Oral (04/01 0837) BP: 100/64 mmHg (04/01 0856) Pulse Rate: 71 (04/01 0856)  Labs:  Recent Labs  09/07/12 0500 09/08/12 0645 09/09/12 0500  HGB 13.5 13.0 12.8*  HCT 40.8 39.4 36.7*  PLT 215 229 226  LABPROT 19.1* 19.5* 18.1*  INR 1.66* 1.71* 1.55*  CREATININE 0.53 0.53 0.47*    Assessment: 57 yo male on Coumadin for hx recent DVT (1/14). PTA Coumadin regimen: 7.5mg  daily except 5mg  MWF. INR on admission supratherapeutic (3.33). INR today subtherapeutic 1.55. One dose was missed on 3/28 but INR dropped again today.  Should have already seen that drop.  CBC stable. No bleeding reported.  Mr. Mathieson continues on Vancomycin for MRSA bacteremia. TEE planned for today. Vancomycin trough is 14.4 today. Afebrile, WBC wnl.   3/23 vanc>> 3/23 zosyn > 3/24 3/25 VT 5.3 3/24 ancef>>3/25 4/1 VT 14.4  3/23: MRSA ZOX:[id number] 3/23: Blood:MRSA 3/23: Wound cx LE abscess: MRSA Blood 3/25 x1 >>> neg, final Blood 3/26 >>> neg, final 3/27 Urine: Neg, final  Goal of Therapy:  INR 2-3 Monitor platelets by anticoagulation protocol: Yes Vancomycin trough level 15-20 mcg/ml    Plan:  - Coumadin 10 mg po x 1 today - Daily INR - Continue vancomycin 750mg  IV q8hr    Doris Cheadle, PharmD Clinical Pharmacist Pager: [phone] Phone: [phone] 09/09/2012 11:29 AM

## 2012-09-09 NOTE — Progress Notes (Signed)
Clinical Social Worker reviewed chart and staffed case with MD and RNCM.  Of note: Pt will need to be on trach collar 28% prior to dc'ing to [facility].  CSW to continue to follow and assist as needed.   [name], MSW, [place] [phone]

## 2012-09-09 NOTE — Progress Notes (Signed)
  Echocardiogram Echocardiogram Transesophageal has been performed.  Victor Durham FRANCES 09/09/2012, 5:36 PM

## 2012-09-09 NOTE — H&P (View-Only) (Signed)
  [facility]  Day # 10 vancomycin  Subjective: No new complaints   Antibiotics:  Anti-infectives   Start     Dose/Rate Route Frequency Ordered Stop   09/07/12 1800  vancomycin (VANCOCIN) 750 mg in sodium chloride 0.9 % 150 mL IVPB     750 mg 150 mL/hr over 60 Minutes Intravenous Every 8 hours 09/07/12 0939     09/06/12 1000  vancomycin (VANCOCIN) 750 mg in sodium chloride 0.9 % 150 mL IVPB  Status:  Discontinued     750 mg 150 mL/hr over 60 Minutes Intravenous Every 12 hours 09/05/12 1143 09/07/12 0939   09/04/12 0200  vancomycin (VANCOCIN) IVPB 1000 mg/200 mL premix  Status:  Discontinued     1,000 mg 200 mL/hr over 60 Minutes Intravenous Every 8 hours 09/03/12 1808 09/05/12 1052   09/03/12 1815  vancomycin (VANCOCIN) IVPB 1000 mg/200 mL premix  Status:  Discontinued     1,000 mg 200 mL/hr over 60 Minutes Intravenous  Once 09/03/12 1808 09/07/12 0939   09/03/12 1800  vancomycin (VANCOCIN) IVPB 1000 mg/200 mL premix  Status:  Discontinued     1,000 mg 200 mL/hr over 60 Minutes Intravenous Every 8 hours 09/03/12 1800 09/03/12 1808   09/02/12 1700  vancomycin (VANCOCIN) 1,250 mg in sodium chloride 0.9 % 250 mL IVPB  Status:  Discontinued     1,250 mg 166.7 mL/hr over 90 Minutes Intravenous Every 8 hours 09/02/12 1407 09/03/12 1800   09/01/12 1400  ceFAZolin (ANCEF) IVPB 2 g/50 mL premix  Status:  Discontinued     2 g 100 mL/hr over 30 Minutes Intravenous 3 times per day 09/01/12 1028 09/02/12 1439   09/01/12 1200  ceFAZolin (ANCEF) IVPB 1 g/50 mL premix  Status:  Discontinued     1 g 100 mL/hr over 30 Minutes Intravenous 4 times per day 09/01/12 1017 09/01/12 1028   08/31/12 2300  vancomycin (VANCOCIN) IVPB 1000 mg/200 mL premix  Status:  Discontinued     1,000 mg 200 mL/hr over 60 Minutes Intravenous Every 12 hours 08/31/12 1743 09/02/12 1407   08/31/12 1745  piperacillin-tazobactam (ZOSYN) IVPB 3.375 g  Status:  Discontinued     3.375 g 12.5 mL/hr  over 240 Minutes Intravenous 3 times per day 08/31/12 1735 09/01/12 1017   08/31/12 1215  vancomycin (VANCOCIN) IVPB 1000 mg/200 mL premix     1,000 mg 200 mL/hr over 60 Minutes Intravenous  Once 08/31/12 1206 08/31/12 1326      Medications: Scheduled Meds: . antiseptic oral rinse  15 mL Mouth Rinse QID  . aspirin  81 mg Per Tube Daily  . chlorhexidine  15 mL Mouth Rinse BID  . feeding supplement  30 mL Per Tube BID  . insulin aspart  0-15 Units Subcutaneous Q4H  . pantoprazole sodium  40 mg Per Tube Q1200  . predniSONE  70 mg Per Tube Q breakfast  . pyridostigmine  90 mg Per Tube 5 X Daily  . sodium chloride  3 mL Intravenous Q12H  . sterile water for irrigation  200 mL Irrigation Q4H  . vancomycin  750 mg Intravenous Q8H  . warfarin  10 mg Oral ONCE-1800  . Warfarin - Pharmacist Dosing Inpatient   Does not apply q1800   Continuous Infusions: . dextrose 5 % and 0.9% NaCl 100 mL/hr at 09/09/12 0845  . feeding supplement (JEVITY 1.2 CAL) 1,000 mL (09/07/12 1529)   PRN Meds:.sodium chloride, acetaminophen, acetaminophen, albuterol, fentaNYL, ondansetron (ZOFRAN) IV,   ondansetron, sodium chloride, sodium chloride   Objective: Weight change:   Intake/Output Summary (Last 24 hours) at 09/09/12 1208 Last data filed at 09/09/12 0500  Gross per 24 hour  Intake   1250 ml  Output    800 ml  Net    450 ml   Blood pressure 120/61, pulse 64, temperature 97.6 F (36.4 C), temperature source Axillary, resp. rate 20, height 6' (1.829 m), weight 152 lb 1.9 oz (69 kg), SpO2 100.00%. Temp:  [97.6 F (36.4 C)-98.3 F (36.8 C)] 97.6 F (36.4 C) (04/01 1139) Pulse Rate:  [60-89] 64 (04/01 1139) Resp:  [12-27] 20 (04/01 1139) BP: (97-137)/(61-83) 120/61 mmHg (04/01 1139) SpO2:  [94 %-100 %] 100 % (04/01 1139) FiO2 (%):  [40 %] 40 % (04/01 0856)  Physical Exam: General: Alert and awake, oriented x3, . HEENT: tracheal site clean CVS regular rate, normal r,  no murmur rubs or  gallops Chest: Coarse Rhonchi Abdomen: soft nontender, nondistended, normal bowel sounds, Extremities: dressing  Neuro: nonfocal  Lab Results:  Recent Labs  09/08/12 0645 09/09/12 0500  WBC 7.5 7.0  HGB 13.0 12.8*  HCT 39.4 36.7*  PLT 229 226    BMET  Recent Labs  09/08/12 0645 09/09/12 0500  NA 136 137  K 3.7 3.7  CL 98 100  CO2 32 30  GLUCOSE 103* 100*  BUN 25* 19  CREATININE 0.53 0.47*  CALCIUM 8.8 8.6    Micro Results: Recent Results (from the past 240 hour(s))  CULTURE, BLOOD (ROUTINE X 2)     Status: None   Collection Time    08/31/12 11:07 AM      Result Value Range Status   Specimen Description BLOOD RIGHT ARM   Final   Special Requests BOTTLES DRAWN AEROBIC AND ANAEROBIC 10CC   Final   Culture  Setup Time 08/31/2012 20:34   Final   Culture     Final   Value: STAPHYLOCOCCUS AUREUS     Note: SUSCEPTIBILITIES PERFORMED ON PREVIOUS CULTURE WITHIN THE LAST 5 DAYS.     Note: Gram Stain Report Called to,Read Back By and Verified With: PAULINE COLQUHOUN 09/01/12 0830 BY SMITHERSJ   Report Status 09/03/2012 FINAL   Final  CULTURE, BLOOD (ROUTINE X 2)     Status: None   Collection Time    08/31/12 11:14 AM      Result Value Range Status   Specimen Description BLOOD RIGHT HAND   Final   Special Requests BOTTLES DRAWN AEROBIC AND ANAEROBIC 6CC   Final   Culture  Setup Time 08/31/2012 20:34   Final   Culture     Final   Value: METHICILLIN RESISTANT STAPHYLOCOCCUS AUREUS     Note: RIFAMPIN AND GENTAMICIN SHOULD NOT BE USED AS SINGLE DRUGS FOR TREATMENT OF STAPH INFECTIONS. CRITICAL RESULT CALLED TO, READ BACK BY AND VERIFIED WITH: LISA JOHNSON @ 1352 09/02/12 BY KRAWS This organism DOES NOT demonstrate inducible Clindamycin      resistance in vitro.     Note: Gram Stain Report Called to,Read Back By and Verified With: PAULINE COLQUHOUN 09/01/12 0830 BY SMITHERSJ   Report Status 09/03/2012 FINAL   Final   Organism ID, Bacteria METHICILLIN RESISTANT STAPHYLOCOCCUS  AUREUS   Final  CULTURE, ROUTINE-ABSCESS     Status: None   Collection Time    08/31/12 12:07 PM      Result Value Range Status   Specimen Description ABSCESS RIGHT LOWER LEG   Final   Special Requests NONE     Final   Gram Stain     Final   Value: RARE WBC PRESENT,BOTH PMN AND MONONUCLEAR     NO SQUAMOUS EPITHELIAL CELLS SEEN     FEW GRAM POSITIVE COCCI     IN PAIRS IN CLUSTERS   Culture     Final   Value: MODERATE METHICILLIN RESISTANT STAPHYLOCOCCUS AUREUS     Note: RIFAMPIN AND GENTAMICIN SHOULD NOT BE USED AS SINGLE DRUGS FOR TREATMENT OF STAPH INFECTIONS. This organism DOES NOT demonstrate inducible Clindamycin resistance in vitro. CRITICAL RESULT CALLED TO, READ BACK BY AND VERIFIED WITH: GENEVIEVE RN BY      INGRAM A 3/26 840AM   Report Status 09/03/2012 FINAL   Final   Organism ID, Bacteria METHICILLIN RESISTANT STAPHYLOCOCCUS AUREUS   Final  MRSA PCR SCREENING     Status: None   Collection Time    08/31/12  5:22 PM      Result Value Range Status   MRSA by PCR NEGATIVE  NEGATIVE Final   Comment:            The GeneXpert MRSA Assay (FDA     approved for NASAL specimens     only), is one component of a     comprehensive MRSA colonization     surveillance program. It is not     intended to diagnose MRSA     infection nor to guide or     monitor treatment for     MRSA infections.  CULTURE, BLOOD (SINGLE)     Status: None   Collection Time    09/02/12  4:48 PM      Result Value Range Status   Specimen Description BLOOD RIGHT ANTECUBITAL   Final   Special Requests BOTTLES DRAWN AEROBIC ONLY 5CC   Final   Culture  Setup Time 09/02/2012 22:23   Final   Culture NO GROWTH 5 DAYS   Final   Report Status 09/08/2012 FINAL   Final  CULTURE, BLOOD (ROUTINE X 2)     Status: None   Collection Time    09/03/12  4:30 AM      Result Value Range Status   Specimen Description BLOOD LEFT ARM   Final   Special Requests BOTTLES DRAWN AEROBIC AND ANAEROBIC 10CC    Final   Culture  Setup  Time 09/03/2012 08:52   Final   Culture NO GROWTH 5 DAYS   Final   Report Status 09/09/2012 FINAL   Final  CULTURE, BLOOD (ROUTINE X 2)     Status: None   Collection Time    09/03/12  4:40 AM      Result Value Range Status   Specimen Description BLOOD LEFT HAND   Final   Special Requests BOTTLES DRAWN AEROBIC AND ANAEROBIC 10CC   Final   Culture  Setup Time 09/03/2012 08:52   Final   Culture NO GROWTH 5 DAYS   Final   Report Status 09/09/2012 FINAL   Final  URINE CULTURE     Status: None   Collection Time    09/04/12  9:02 PM      Result Value Range Status   Specimen Description URINE, CATHETERIZED   Final   Special Requests NONE   Final   Culture  Setup Time 09/05/2012 03:03   Final   Colony Count NO GROWTH   Final   Culture NO GROWTH   Final   Report Status 09/06/2012 FINAL   Final    Studies/Results: No results found.      Assessment/Plan: Ayron B Speros is a 56 y.o. 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 His blood cultures have cleared. His transthoracic echocardiogram failed to show vegetation. He is tibia and fibula have been imaged with an MRI which he failed to show evidence of osteomyelitis. He is being followed closely by wound care.  #1 methicillin-resistant staph aureus bacteremia: --agree with TEE --EVEN if TEE negative I WOULD treat him with at least 4 weeks of IV vancomycin with day # 1 being date of verge negative blood culture that being 32514  # Leg abscess: I would like to re-examine this in am.    LOS: 9 days   Joani Cosma [place] 09/09/2012, 12:08 PM   

## 2012-09-09 NOTE — Interval H&P Note (Signed)
History and Physical Interval Note:  09/09/2012 4:43 PM  Victor Durham  has presented today for surgery, with the diagnosis of bacteremia  The various methods of treatment have been discussed with the patient and family. After consideration of risks, benefits and other options for treatment, the patient has consented to  Procedure(s): TRANSESOPHAGEAL ECHOCARDIOGRAM (TEE) (N/A) as a surgical intervention .  The patient's history has been reviewed, patient examined, no change in status, stable for surgery.  I have reviewed the patient's chart and labs.  Questions were answered to the patient's satisfaction.     Daniel Bensimhon

## 2012-09-09 NOTE — Progress Notes (Signed)
Nutrition Brief Note:  MD contacted clinical nutrition staff for resume of TFs with cancellation of TEE yesterday.  Plan is resume feeds today after TEE completed. RD to monitor for resume of TFs at goal rate.   Loyce Dys, MS RD LDN Clinical Inpatient Dietitian Pager: [phone] Weekend/After hours pager: [phone]

## 2012-09-09 NOTE — CV Procedure (Signed)
    TRANSESOPHAGEAL ECHOCARDIOGRAM   NAME:  Victor Durham   MRN: [id number] DOB:  [birth year]   ADMIT DATE: 08/31/2012  INDICATIONS: MRSA bacteremia   PROCEDURE:   Informed consent was obtained prior to the procedure. The risks, benefits and alternatives for the procedure were discussed and the patient comprehended these risks.  Risks include, but are not limited to, cough, sore throat, vomiting, nausea, somnolence, esophageal and stomach trauma or perforation, bleeding, low blood pressure, aspiration, pneumonia, infection, trauma to the teeth and death.    After a procedural time-out, the patient was given 5 mg versed and 50 mcg fentanyl for moderate sedation.  The oropharynx was anesthetized with cetacaine spray.  The transesophageal probe was inserted in the esophagus and stomach without difficulty and multiple views were obtained.    COMPLICATIONS:    There were no immediate complications.  FINDINGS:  LEFT VENTRICLE: EF = 65% No wall motion abnormalities  RIGHT VENTRICLE: Normal  LEFT ATRIUM: Normal  LEFT ATRIAL APPENDAGE: not visualized  RIGHT ATRIUM: Normal  AORTIC VALVE:  Trileaflet. Mildly redundant. Trivial AI. No vegetation  MITRAL VALVE:    Normal. No MR. No vegetation  TRICUSPID VALVE: Mild TR. No vegetation  PULMONIC VALVE: Normal. Trivial PR. No vegetation  INTERATRIAL SEPTUM: No PFO/ASD  PERICARDIUM: No effusion  DESCENDING AORTA: Not visualized   CONCLUSION: No TEE evidence of endocarditis.

## 2012-09-09 NOTE — Consult Note (Addendum)
WOC follow-up: Right posterior chest wound has evolved into 100% yellow slough with large amt thick tan drainage and strong odor.  Removes easily with scissors, pt denies c/o pain, no bleeding.  Wound full thickness 1X1X.3cm with 10% red, 90% slough after opening.  Moist gauze packing to assist with removal of nonviable tissue.  Right posterior leg slowly improving.  Decreasing in size and depth.  90% red, 10% yellow slough, mod amt red drainage, no odor. Tender to touch. Remains with fluctuance and erythremia surrounding wound bed.  Continue packing with Iodoform to promote healing.  Sacrum and buttocks with previous partial thickness lesions appear to be healed.  Buttocks red and macerated and appearance is consistent with moisture associated skin damage. Pt frequently incontinent of large amt thick liquid stool, too thick for Flexiseal. Difficult to keep dressing to area from soiling.  Removed foam dressing and applied barrier cream to protect and repel moisture.   Cammie Mcgee MSN, RN, CWOCN, [place], CNS [phone]

## 2012-09-09 NOTE — Progress Notes (Signed)
Spoke with Social work this am.  Was informed that for patient to be accepted at SNF he must weaned down to 28% FIO2.    Discussed with respiratory.  Weaning will be attempted today.

## 2012-09-09 NOTE — Progress Notes (Signed)
To Endo for TEE. 

## 2012-09-09 NOTE — Progress Notes (Signed)
[facility]  Day # 10 vancomycin  Subjective: No new complaints   Antibiotics:  Anti-infectives   Start     Dose/Rate Route Frequency Ordered Stop   09/07/12 1800  vancomycin (VANCOCIN) 750 mg in sodium chloride 0.9 % 150 mL IVPB     750 mg 150 mL/hr over 60 Minutes Intravenous Every 8 hours 09/07/12 0939     09/06/12 1000  vancomycin (VANCOCIN) 750 mg in sodium chloride 0.9 % 150 mL IVPB  Status:  Discontinued     750 mg 150 mL/hr over 60 Minutes Intravenous Every 12 hours 09/05/12 1143 09/07/12 0939   09/04/12 0200  vancomycin (VANCOCIN) IVPB 1000 mg/200 mL premix  Status:  Discontinued     1,000 mg 200 mL/hr over 60 Minutes Intravenous Every 8 hours 09/03/12 1808 09/05/12 1052   09/03/12 1815  vancomycin (VANCOCIN) IVPB 1000 mg/200 mL premix  Status:  Discontinued     1,000 mg 200 mL/hr over 60 Minutes Intravenous  Once 09/03/12 1808 09/07/12 0939   09/03/12 1800  vancomycin (VANCOCIN) IVPB 1000 mg/200 mL premix  Status:  Discontinued     1,000 mg 200 mL/hr over 60 Minutes Intravenous Every 8 hours 09/03/12 1800 09/03/12 1808   09/02/12 1700  vancomycin (VANCOCIN) 1,250 mg in sodium chloride 0.9 % 250 mL IVPB  Status:  Discontinued     1,250 mg 166.7 mL/hr over 90 Minutes Intravenous Every 8 hours 09/02/12 1407 09/03/12 1800   09/01/12 1400  ceFAZolin (ANCEF) IVPB 2 g/50 mL premix  Status:  Discontinued     2 g 100 mL/hr over 30 Minutes Intravenous 3 times per day 09/01/12 1028 09/02/12 1439   09/01/12 1200  ceFAZolin (ANCEF) IVPB 1 g/50 mL premix  Status:  Discontinued     1 g 100 mL/hr over 30 Minutes Intravenous 4 times per day 09/01/12 1017 09/01/12 1028   08/31/12 2300  vancomycin (VANCOCIN) IVPB 1000 mg/200 mL premix  Status:  Discontinued     1,000 mg 200 mL/hr over 60 Minutes Intravenous Every 12 hours 08/31/12 1743 09/02/12 1407   08/31/12 1745  piperacillin-tazobactam (ZOSYN) IVPB 3.375 g  Status:  Discontinued     3.375 g 12.5 mL/hr  over 240 Minutes Intravenous 3 times per day 08/31/12 1735 09/01/12 1017   08/31/12 1215  vancomycin (VANCOCIN) IVPB 1000 mg/200 mL premix     1,000 mg 200 mL/hr over 60 Minutes Intravenous  Once 08/31/12 1206 08/31/12 1326      Medications: Scheduled Meds: . antiseptic oral rinse  15 mL Mouth Rinse QID  . aspirin  81 mg Per Tube Daily  . chlorhexidine  15 mL Mouth Rinse BID  . feeding supplement  30 mL Per Tube BID  . insulin aspart  0-15 Units Subcutaneous Q4H  . pantoprazole sodium  40 mg Per Tube Q1200  . predniSONE  70 mg Per Tube Q breakfast  . pyridostigmine  90 mg Per Tube 5 X Daily  . sodium chloride  3 mL Intravenous Q12H  . sterile water for irrigation  200 mL Irrigation Q4H  . vancomycin  750 mg Intravenous Q8H  . warfarin  10 mg Oral ONCE-1800  . Warfarin - Pharmacist Dosing Inpatient   Does not apply q1800   Continuous Infusions: . dextrose 5 % and 0.9% NaCl 100 mL/hr at 09/09/12 0845  . feeding supplement (JEVITY 1.2 CAL) 1,000 mL (09/07/12 1529)   PRN Meds:.sodium chloride, acetaminophen, acetaminophen, albuterol, fentaNYL, ondansetron (ZOFRAN) IV,  ondansetron, sodium chloride, sodium chloride   Objective: Weight change:   Intake/Output Summary (Last 24 hours) at 09/09/12 1208 Last data filed at 09/09/12 0500  Gross per 24 hour  Intake   1250 ml  Output    800 ml  Net    450 ml   Blood pressure 120/61, pulse 64, temperature 97.6 F (36.4 C), temperature source Axillary, resp. rate 20, height 6' (1.829 m), weight 152 lb 1.9 oz (69 kg), SpO2 100.00%. Temp:  [97.6 F (36.4 C)-98.3 F (36.8 C)] 97.6 F (36.4 C) (04/01 1139) Pulse Rate:  [60-89] 64 (04/01 1139) Resp:  [12-27] 20 (04/01 1139) BP: (97-137)/(61-83) 120/61 mmHg (04/01 1139) SpO2:  [94 %-100 %] 100 % (04/01 1139) FiO2 (%):  [40 %] 40 % (04/01 0856)  Physical Exam: General: Alert and awake, oriented x3, . HEENT: tracheal site clean CVS regular rate, normal r,  no murmur rubs or  gallops Chest: Coarse Rhonchi Abdomen: soft nontender, nondistended, normal bowel sounds, Extremities: dressing  Neuro: nonfocal  Lab Results:  Recent Labs  09/08/12 0645 09/09/12 0500  WBC 7.5 7.0  HGB 13.0 12.8*  HCT 39.4 36.7*  PLT 229 226    BMET  Recent Labs  09/08/12 0645 09/09/12 0500  NA 136 137  K 3.7 3.7  CL 98 100  CO2 32 30  GLUCOSE 103* 100*  BUN 25* 19  CREATININE 0.53 0.47*  CALCIUM 8.8 8.6    Micro Results: Recent Results (from the past 240 hour(s))  CULTURE, BLOOD (ROUTINE X 2)     Status: None   Collection Time    08/31/12 11:07 AM      Result Value Range Status   Specimen Description BLOOD RIGHT ARM   Final   Special Requests BOTTLES DRAWN AEROBIC AND ANAEROBIC 10CC   Final   Culture  Setup Time 08/31/2012 20:34   Final   Culture     Final   Value: STAPHYLOCOCCUS AUREUS     Note: SUSCEPTIBILITIES PERFORMED ON PREVIOUS CULTURE WITHIN THE LAST 5 DAYS.     Note: Gram Stain Report Called to,Read Back By and Verified With: PAULINE COLQUHOUN 09/01/12 0830 BY SMITHERSJ   Report Status 09/03/2012 FINAL   Final  CULTURE, BLOOD (ROUTINE X 2)     Status: None   Collection Time    08/31/12 11:14 AM      Result Value Range Status   Specimen Description BLOOD RIGHT HAND   Final   Special Requests BOTTLES DRAWN AEROBIC AND ANAEROBIC [facility]   Final   Culture  Setup Time 08/31/2012 20:34   Final   Culture     Final   Value: METHICILLIN RESISTANT STAPHYLOCOCCUS AUREUS     Note: RIFAMPIN AND GENTAMICIN SHOULD NOT BE USED AS SINGLE DRUGS FOR TREATMENT OF STAPH INFECTIONS. CRITICAL RESULT CALLED TO, READ BACK BY AND VERIFIED WITH: LISA JOHNSON @ 1352 09/02/12 BY KRAWS This organism DOES NOT demonstrate inducible Clindamycin      resistance in vitro.     Note: Gram Stain Report Called to,Read Back By and Verified With: PAULINE COLQUHOUN 09/01/12 0830 BY SMITHERSJ   Report Status 09/03/2012 FINAL   Final   Organism ID, Bacteria METHICILLIN RESISTANT STAPHYLOCOCCUS  AUREUS   Final  CULTURE, ROUTINE-ABSCESS     Status: None   Collection Time    08/31/12 12:07 PM      Result Value Range Status   Specimen Description ABSCESS RIGHT LOWER LEG   Final   Special Requests NONE  Final   Gram Stain     Final   Value: RARE WBC PRESENT,BOTH PMN AND MONONUCLEAR     NO SQUAMOUS EPITHELIAL CELLS SEEN     FEW GRAM POSITIVE COCCI     IN PAIRS IN CLUSTERS   Culture     Final   Value: MODERATE METHICILLIN RESISTANT STAPHYLOCOCCUS AUREUS     Note: RIFAMPIN AND GENTAMICIN SHOULD NOT BE USED AS SINGLE DRUGS FOR TREATMENT OF STAPH INFECTIONS. This organism DOES NOT demonstrate inducible Clindamycin resistance in vitro. CRITICAL RESULT CALLED TO, READ BACK BY AND VERIFIED WITH: GENEVIEVE RN BY      INGRAM A 3/26 840AM   Report Status 09/03/2012 FINAL   Final   Organism ID, Bacteria METHICILLIN RESISTANT STAPHYLOCOCCUS AUREUS   Final  MRSA PCR SCREENING     Status: None   Collection Time    08/31/12  5:22 PM      Result Value Range Status   MRSA by PCR NEGATIVE  NEGATIVE Final   Comment:            The GeneXpert MRSA Assay (FDA     approved for NASAL specimens     only), is one component of a     comprehensive MRSA colonization     surveillance program. It is not     intended to diagnose MRSA     infection nor to guide or     monitor treatment for     MRSA infections.  CULTURE, BLOOD (SINGLE)     Status: None   Collection Time    09/02/12  4:48 PM      Result Value Range Status   Specimen Description BLOOD RIGHT ANTECUBITAL   Final   Special Requests BOTTLES DRAWN AEROBIC ONLY 5CC   Final   Culture  Setup Time 09/02/2012 22:23   Final   Culture NO GROWTH 5 DAYS   Final   Report Status 09/08/2012 FINAL   Final  CULTURE, BLOOD (ROUTINE X 2)     Status: None   Collection Time    09/03/12  4:30 AM      Result Value Range Status   Specimen Description BLOOD LEFT ARM   Final   Special Requests BOTTLES DRAWN AEROBIC AND ANAEROBIC 10CC    Final   Culture  Setup  Time 09/03/2012 08:52   Final   Culture NO GROWTH 5 DAYS   Final   Report Status 09/09/2012 FINAL   Final  CULTURE, BLOOD (ROUTINE X 2)     Status: None   Collection Time    09/03/12  4:40 AM      Result Value Range Status   Specimen Description BLOOD LEFT HAND   Final   Special Requests BOTTLES DRAWN AEROBIC AND ANAEROBIC 10CC   Final   Culture  Setup Time 09/03/2012 08:52   Final   Culture NO GROWTH 5 DAYS   Final   Report Status 09/09/2012 FINAL   Final  URINE CULTURE     Status: None   Collection Time    09/04/12  9:02 PM      Result Value Range Status   Specimen Description URINE, CATHETERIZED   Final   Special Requests NONE   Final   Culture  Setup Time 09/05/2012 03:03   Final   Colony Count NO GROWTH   Final   Culture NO GROWTH   Final   Report Status 09/06/2012 FINAL   Final    Studies/Results: No results found.  Assessment/Plan: Victor Durham is a 57 y.o. 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 His blood cultures have cleared. His transthoracic echocardiogram failed to show vegetation. He is tibia and fibula have been imaged with an MRI which he failed to show evidence of osteomyelitis. He is being followed closely by wound care.  #1 methicillin-resistant staph aureus bacteremia: --agree with TEE --EVEN if TEE negative I WOULD treat him with at least 4 weeks of IV vancomycin with day # 1 being date of verge negative blood culture that being [id number]  # Leg abscess: I would like to re-examine this in am.    LOS: 9 days   Paulette Blanch Dam 09/09/2012, 12:08 PM

## 2012-09-09 NOTE — Progress Notes (Signed)
RT called to room due to pt required more O2 and pt stating he is having trouble catching his breath. RT suctioned pt and changed inner cannula. RR in mid 20s, SpO2 100% on 60% ATC. HR 76. Turned O2 down to 28% ATC, pt SpO2 staying in mid 90s. Non-labored breathing when pt is relaxed and drifting off to sleep. RN aware. RT will continue to monitor.

## 2012-09-09 NOTE — Progress Notes (Signed)
Was called to bedside by RT and RN due to patient reporting that he is feeling more tired with breathing.  Watching patient breathe while asleep, he has a normal work of breathing with O2 in the high 90's. When examined, patient remains calm and states using pen and paper that he is just overall tired after the TEE test today. He denies any chest pain, any trouble breathing more than normal. O2 remains in the mid to high 90's on 28% FiO2.  Will get CXR and ABG to make sure that patient remains at his baseline.   Marena Chancy, PGY-2 Family Medicine Resident

## 2012-09-09 NOTE — Progress Notes (Signed)
Spoke with Trish from [facility] this am.  She reports that she was unaware of need for TEE.  She will attempt to get patient on schedule for today.

## 2012-09-09 NOTE — Progress Notes (Signed)
Daily Progress Note Family Medicine Teaching Service Service Pager: [phone]  Subjective:  Feeling well this am (thumbs up) but reports that his "lungs are tired." No other complaints this am.  Is currently doing well on Trach collar 40% FIO2.  Objective:  Temp:  [98 F (36.7 C)-98.3 F (36.8 C)] 98.3 F (36.8 C) (04/01 0020) Pulse Rate:  [60-89] 84 (04/01 0356) Resp:  [12-27] 27 (04/01 0356) BP: (97-137)/(63-83) 106/70 mmHg (04/01 0356) SpO2:  [94 %-100 %] 100 % (04/01 0356) FiO2 (%):  [40 %] 40 % (04/01 0356)  Intake/Output Summary (Last 24 hours) at 09/09/12 0705 Last data filed at 09/09/12 0500  Gross per 24 hour  Intake   1250 ml  Output    800 ml  Net    450 ml    Physical Exam: General: awake, resting comfortably in bed Heart: RRR. No murmurs, rubs, or gallops.  Lungs: CTAB.  Abd: PEG in place with feeds. Site clean/dry/intact. Back: Dressing in place.  Clean, dry, intact.   Extremities: RLE - dressing in place; dry, intact. Neuro: moves all extremities; able to sit up unassisted this am.   Labs and imaging:  CBC  Recent Labs Lab 09/07/12 0500 09/08/12 0645 09/09/12 0500  WBC 7.4 7.5 7.0  HGB 13.5 13.0 12.8*  HCT 40.8 39.4 36.7*  PLT 215 229 226   BMET/CMET  Recent Labs Lab 09/07/12 0500 09/08/12 0645 09/09/12 0500  NA 131* 136 137  K 3.5 3.7 3.7  CL 91* 98 100  CO2 33* 32 30  BUN 25* 25* 19  CREATININE 0.53 0.53 0.47*  CALCIUM 8.9 8.8 8.6  PROT  --   --  8.3  BILITOT  --   --  1.6*  ALKPHOS  --   --  68  ALT  --   --  69*  AST  --   --  28  GLUCOSE 117* 103* 100*    Scheduled Medications: . antiseptic oral rinse  15 mL Mouth Rinse QID  . aspirin  81 mg Per Tube Daily  . chlorhexidine  15 mL Mouth Rinse BID  . feeding supplement  30 mL Per Tube BID  . insulin aspart  0-15 Units Subcutaneous Q4H  . pantoprazole sodium  40 mg Per Tube Q1200  . predniSONE  70 mg Per Tube Q breakfast  . pyridostigmine  90 mg Per Tube 5 X Daily  .  sodium chloride  3 mL Intravenous Q12H  . sterile water for irrigation  200 mL Irrigation Q4H  . vancomycin  750 mg Intravenous Q8H  . Warfarin - Pharmacist Dosing Inpatient   Does not apply q1800   Continuous Infusions: . dextrose 5 % and 0.9% NaCl 100 mL/hr at 09/07/12 2335  . feeding supplement (JEVITY 1.2 CAL) 1,000 mL (09/07/12 1529)   PRN Medications:sodium chloride, acetaminophen, acetaminophen, albuterol, fentaNYL, ondansetron (ZOFRAN) IV, ondansetron, sodium chloride, sodium chloride  Assessment  57 yo M with RLE cellulitis and multiple abscesses as well as MRSA bacteremia now with associated myasthenic crisis.   # Myasthenic Crisis - thought related to increased prednisone abruptly (given for stress dose) but possibly due to infection given crisis started <12 hours after admin high dose prednisone and typically 5-10 days after increased dose of prednisone. .  - Neurology following and we greatly appreciate their help in managing this patient.  Will appreciate recommendations regarding course of Prednisone. - Now s/p 5 day course of IV IG and continued on high dose prednisone 70mg   in addition to pyridostigmine.    #MRSA bacteremia-likely source RLE cellulitis and multiple abscess. MRI tib/fib showed cellulitis, with no infiltration to muscle/bone.  - ID following and we greatly appreciate their help in managing this patient. - Patient to go for TEE today. - Will continue Vanc per pharmacy (Day 9 of Vanc)  # H/o DVT - Coumadin per pharmacy.   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 - SLP consulted for Passy [place]ir valve assessment  FEN/GI -  Currently NPO. Will resume Peg feeds following TEE today.  Ppx- coumadin per pharmacy. Dispo - pending continued clinical improvement. When stable will be discharged to [facility].   Code: FULL code Contact: Victor Durham, father: [phone]   Everlene Other, DO Family Medicine PGY-1

## 2012-09-09 NOTE — Progress Notes (Signed)
I examined this patient and discussed the care plan with Dr Cook and the FPTS team and agree with assessment and plan as documented in the progress note above.  

## 2012-09-10 ENCOUNTER — Inpatient Hospital Stay (HOSPITAL_COMMUNITY): Payer: Medicaid Other

## 2012-09-10 ENCOUNTER — Encounter (HOSPITAL_COMMUNITY): Payer: Self-pay | Admitting: Internal Medicine

## 2012-09-10 DIAGNOSIS — L03319 Cellulitis of trunk, unspecified: Secondary | ICD-10-CM

## 2012-09-10 LAB — BASIC METABOLIC PANEL
BUN: 17 mg/dL (ref 6–23)
Chloride: 104 mEq/L (ref 96–112)
Creatinine, Ser: 0.44 mg/dL — ABNORMAL LOW (ref 0.50–1.35)
GFR calc Af Amer: >90 mL/min (ref >90)
GFR calc non Af Amer: >90 mL/min (ref >90)
Potassium: 3.9 mEq/L (ref 3.5–5.1)

## 2012-09-10 LAB — CBC
HCT: 39.4 % (ref 39.0–52.0)
Hemoglobin: 13.5 g/dL (ref 13.0–17.0)
MCH: 30.1 pg (ref 26.0–34.0)
MCHC: 34.3 g/dL (ref 30.0–36.0)

## 2012-09-10 LAB — GLUCOSE, CAPILLARY
Glucose-Capillary: 112 mg/dL — ABNORMAL HIGH (ref 70–99)
Glucose-Capillary: 121 mg/dL — ABNORMAL HIGH (ref 70–99)
Glucose-Capillary: 87 mg/dL (ref 70–99)

## 2012-09-10 LAB — BLOOD GAS, ARTERIAL
Bicarbonate: 27.6 mEq/L — ABNORMAL HIGH (ref 20.0–24.0)
O2 Saturation: 94.5 %
pCO2 arterial: 41.6 mmHg (ref 35.0–45.0)
pO2, Arterial: 68.3 mmHg — ABNORMAL LOW (ref 80.0–100.0)

## 2012-09-10 LAB — PROTIME-INR: Prothrombin Time: 22 seconds — ABNORMAL HIGH (ref 11.6–15.2)

## 2012-09-10 LAB — POCT I-STAT, CHEM 8
Creatinine, Ser: 0.3 mg/dL — ABNORMAL LOW (ref 0.50–1.35)
Glucose, Bld: 91 mg/dL (ref 70–99)
HCT: 21 % — ABNORMAL LOW (ref 39.0–52.0)
Hemoglobin: 7.1 g/dL — ABNORMAL LOW (ref 13.0–17.0)
Potassium: 2.3 mEq/L — CL (ref 3.5–5.1)
Sodium: 150 mEq/L — ABNORMAL HIGH (ref 135–145)
TCO2: 17 mmol/L (ref 0–100)

## 2012-09-10 MED ORDER — ACETAMINOPHEN 325 MG PO TABS: 650.0000 mg | ORAL_TABLET | ORAL | Status: DC | PRN

## 2012-09-10 MED ORDER — SODIUM CHLORIDE 0.9 % IV SOLN
2.0000 g | INTRAVENOUS | Status: DC | PRN
  Filled 2012-09-10: qty 20

## 2012-09-10 MED ORDER — SODIUM CHLORIDE 0.9 % IV SOLN
Freq: Once | INTRAVENOUS | Status: DC
  Filled 2012-09-10: qty 200

## 2012-09-10 MED ORDER — HEPARIN SODIUM (PORCINE) 1000 UNIT/ML IJ SOLN
1000.0000 [IU] | Freq: Once | INTRAMUSCULAR | Status: AC
  Administered 2012-09-10: 1000 [IU]
  Filled 2012-09-10: qty 1

## 2012-09-10 MED ORDER — WARFARIN SODIUM 7.5 MG PO TABS
7.5000 mg | ORAL_TABLET | Freq: Once | ORAL | Status: AC
  Administered 2012-09-10: 7.5 mg via ORAL
  Filled 2012-09-10: qty 1

## 2012-09-10 MED ORDER — HEPARIN SODIUM (PORCINE) 1000 UNIT/ML IJ SOLN
2800.0000 [IU] | Freq: Once | INTRAMUSCULAR | Status: AC
  Administered 2012-09-10: 2800 [IU] via INTRAVENOUS
  Filled 2012-09-10: qty 2.8

## 2012-09-10 MED ORDER — CALCIUM CARBONATE ANTACID 500 MG PO CHEW
2.0000 | CHEWABLE_TABLET | ORAL | Status: DC | PRN
  Filled 2012-09-10: qty 2

## 2012-09-10 MED ORDER — PREDNISONE 50 MG PO TABS
60.0000 mg | ORAL_TABLET | Freq: Every day | ORAL | Status: DC
  Administered 2012-09-11 – 2012-09-13 (×3): 60 mg
  Filled 2012-09-10 (×4): qty 1

## 2012-09-10 MED ORDER — DIPHENHYDRAMINE HCL 25 MG PO CAPS: 25.0000 mg | ORAL_CAPSULE | Freq: Four times a day (QID) | ORAL | Status: DC | PRN

## 2012-09-10 MED ORDER — CALCIUM GLUCONATE 10 % IV SOLN
2.0000 g | INTRAVENOUS | Status: DC | PRN
  Filled 2012-09-10: qty 20

## 2012-09-10 MED ORDER — SODIUM CHLORIDE 0.9 % IV SOLN
4.0000 g | INTRAVENOUS | Status: DC | PRN
  Filled 2012-09-10 (×2): qty 40

## 2012-09-10 MED ORDER — ACD FORMULA A 0.73-2.45-2.2 GM/100ML VI SOLN
500.0000 mL | Status: DC
  Filled 2012-09-10: qty 500

## 2012-09-10 MED ORDER — SODIUM CHLORIDE 0.9 % IV SOLN
INTRAVENOUS | Status: AC
  Administered 2012-09-10 (×4): via INTRAVENOUS_CENTRAL
  Filled 2012-09-10 (×4): qty 200

## 2012-09-10 MED ORDER — SODIUM CHLORIDE 0.9 % IV SOLN
INTRAVENOUS | Status: DC
  Filled 2012-09-10 (×4): qty 200

## 2012-09-10 MED ORDER — ACD FORMULA A 0.73-2.45-2.2 GM/100ML VI SOLN
Status: AC
  Administered 2012-09-10: 52 mL
  Filled 2012-09-10: qty 500

## 2012-09-10 NOTE — Progress Notes (Signed)
Upon morning assessment; nurse observed pt to have labored breathing with increased work of breathing; oxygen saturations maintained mid/high 90's.  Pt asked for "something to calm me down", and stated " I can't hardly breath".  Pt denied pain.  Nurse ausculted breath sounds and determined pt was rhonchus/diminished.  Nurse informed PA of pt behavior/concerns and requested anti-anxiety medication and assessment.  Nurse also contacted respiratory and asked for assessment of pt.

## 2012-09-10 NOTE — Progress Notes (Signed)
Occupational Therapy Note  OT order received.  Pt evaluated on 3/31, and OT signed off due to pt at baseline and from SNF with plans to return to SNF.  Current d/c plan continues to be SNF when pt is medically appropriate and weaned down to 28%FiO2.  No acute OT needs at this time.  Signing off.  09/10/2012 Cipriano Mile OTR/L Pager [phone] Office [phone]

## 2012-09-10 NOTE — Progress Notes (Signed)
      [facility]icrobial Management Team Staphylococcus aureus bacteremia   Staphylococcus aureus bacteremia (SAB) is associated with a high rate of complications and mortality.  Specific aspects of clinical management are critical to optimizing the outcome of patients with SAB.  Therefore, the [facility] Antimicrobial Management Team [facility]) has initiated an intervention aimed at improving the management of SAB at [facility].  To do so, Infectious Diseases physicians are providing an evidence-based consult for the management of all patients with SAB.     Yes No Comments  Perform follow-up blood cultures (even if the patient is afebrile) to ensure clearance of bacteremia [x]  []  Negative on 09/02/12  Remove vascular catheter and obtain follow-up blood cultures after the removal of the catheter []  []  NA  Perform echocardiography to evaluate for endocarditis (transthoracic ECHO is 40-50% sensitive, TEE is > 90% sensitive) [x]  []   TEE negative  Consult electrophysiologist to evaluate implanted cardiac device (pacemaker, ICD) []  [x]  NA  Ensure source control [x]  []  Have all abscesses been drained effectively? Have deep seeded infections (septic joints or osteomyelitis) had appropriate surgical debridement?  Investigate for "metastatic" sites of infection [x]  []  Does the patient have ANY symptom or physical exam finding that would suggest a deeper infection (back or neck pain that may be suggestive of vertebral osteomyelitis or epidural abscess, muscle pain that could be a symptom of pyomyositis)?  Keep in mind that for deep seeded infections MRI imaging with contrast is preferred rather than other often insensitive tests such as plain x-rays, especially early in a patient's presentation. No residual foci obvious baed on hx and exam  Continue vancomycin []  []    If on Vancomycin, goal trough should be 15 - 20 mcg/mL  Estimated duration of IV antibiotic therapy:  4 weeks [x]  []  Consult case  management for probably prolonged outpatient IV antibiotic therapy

## 2012-09-10 NOTE — Progress Notes (Signed)
ANTICOAGULATION/ANTIBIOTIC CONSULT NOTE - Follow Up Consult  Pharmacy Consult for coumadin and Vancomycin Indication: Recent DVT/MRSA bacteremia  No Known Allergies  Patient Measurements: Height: 6' (182.9 cm) Weight: 152 lb 1.9 oz (69 kg) IBW/kg (Calculated) : 77.6  Vital Signs: Temp: 97.3 F (36.3 C) (04/02 0740) Temp src: Oral (04/02 0740) BP: 134/75 mmHg (04/02 0800) Pulse Rate: 88 (04/02 0800)  Labs:  Recent Labs  09/08/12 0645 09/09/12 0500 09/10/12 0500  HGB 13.0 12.8* 13.5  HCT 39.4 36.7* 39.4  PLT 229 226 234  LABPROT 19.5* 18.1* 22.0*  INR 1.71* 1.55* 2.01*  CREATININE 0.53 0.47*  --     Assessment: 57 yo male on Coumadin for hx recent DVT (1/14). PTA Coumadin regimen: 7.5mg  daily except 5mg  MWF. INR on admission supratherapeutic (3.33). INR today therapeutic at 2.01. CBC is stable.  Mr. Linch continues on Vancomycin for MRSA bacteremia. TEE negative for vegetations. Vancomycin trough was 14.4 on 4/1. Afebrile, WBC up to 11.8.   3/23 vanc>> 3/23 zosyn > 3/24 3/25 VT 5.3 3/24 ancef>>3/25 4/1 VT 14.4  3/23: MRSA ZOX:[id number] 3/23: Blood:MRSA 3/23: Wound cx LE abscess: MRSA Blood 3/25 x1 >>> neg, final Blood 3/26 >>> neg, final 3/27 Urine: Neg, final  Goal of Therapy:  INR 2-3 Monitor platelets by anticoagulation protocol: Yes Vancomycin trough level 15-20 mcg/ml    Plan:  - Coumadin 7.5 mg po x 1 today - Daily INR - Continue vancomycin 750mg  IV q8hr    Celedonio Miyamoto, PharmD, BCPS Clinical Pharmacist Pager [phone]   09/10/2012 9:07 AM

## 2012-09-10 NOTE — Progress Notes (Signed)
SLP Cancellation Note  Patient Details Name: Victor Durham MRN: [id number] DOB: [birth year]   Cancelled treatment:        Pt. Back on vent since this am due to increased WOB.  Pt. Mouthed "It won't be long" until weaned.  When SLP stated that we will return to place the PMSV once he is off the vent, pt. Smiled and gave a thumbs up sign.   Maryjo Rochester T 09/10/2012, 3:51 PM

## 2012-09-10 NOTE — Progress Notes (Signed)
Patient ID: Victor Durham, male   DOB: 1955/10/02, 57 y.o.   MRN: [id number] Daily Progress Note Family Medicine Teaching Service Service Pager: [phone]  Subjective:  Patient is tired. He answers questions with a head nod, but falls back to sleep. No other complaints this am.  Is currently doing well on Trach collar 28% FIO2.  Objective:  Temp:  [97.6 F (36.4 C)-98.6 F (37 C)] 97.8 F (36.6 C) (04/02 0436) Pulse Rate:  [64-96] 80 (04/02 0700) Resp:  [17-30] 18 (04/02 0700) BP: (87-143)/(46-94) 120/75 mmHg (04/02 0700) SpO2:  [83 %-100 %] 94 % (04/02 0700) FiO2 (%):  [28 %-50 %] 28 % (04/02 0436)  Intake/Output Summary (Last 24 hours) at 09/10/12 0739 Last data filed at 09/10/12 0600  Gross per 24 hour  Intake   1540 ml  Output   1150 ml  Net    390 ml   UOP: 0.22mL/Kg/Hr Physical Exam: General: resting comfortably in bed, sleeping Heart: RRR. No murmurs, rubs, or gallops.  Lungs: Mild rhonchi present. Mild wheeze present LLL. Increased WOB.  Abd: PEG in place with feeds. Site clean/dry/intact. Back: Dressing in place.  Clean, dry, intact.   Extremities: RLE - dressing in place; dry, intact. Neuro: moves all extremities; sleeping  Labs and imaging:  CBC  Recent Labs Lab 09/08/12 0645 09/09/12 0500 09/10/12 0500  WBC 7.5 7.0 11.8*  HGB 13.0 12.8* 13.5  HCT 39.4 36.7* 39.4  PLT 229 226 234   BMET/CMET  Recent Labs Lab 09/07/12 0500 09/08/12 0645 09/09/12 0500  NA 131* 136 137  K 3.5 3.7 3.7  CL 91* 98 100  CO2 33* 32 30  BUN 25* 25* 19  CREATININE 0.53 0.53 0.47*  CALCIUM 8.9 8.8 8.6  PROT  --   --  8.3  BILITOT  --   --  1.6*  ALKPHOS  --   --  68  ALT  --   --  69*  AST  --   --  28  GLUCOSE 117* 103* 100*    Scheduled Medications: . antiseptic oral rinse  15 mL Mouth Rinse QID  . aspirin  81 mg Per Tube Daily  . chlorhexidine  15 mL Mouth Rinse BID  . feeding supplement  30 mL Per Tube BID  . insulin aspart  0-15 Units Subcutaneous  Q4H  . pantoprazole sodium  40 mg Per Tube Q1200  . predniSONE  70 mg Per Tube Q breakfast  . pyridostigmine  90 mg Per Tube 5 X Daily  . sodium chloride  3 mL Intravenous Q12H  . sterile water for irrigation  200 mL Irrigation Q4H  . vancomycin  750 mg Intravenous Q8H  . Warfarin - Pharmacist Dosing Inpatient   Does not apply q1800   Continuous Infusions: . feeding supplement (JEVITY 1.2 CAL) 70 mL/hr at 09/09/12 2028   Dg Chest Port 1 View  09/10/2012  *RADIOLOGY REPORT*  Clinical Data: Difficulty breathing, history of myasthenia gravis  PORTABLE CHEST - 1 VIEW  Comparison: Portable chest x-ray of 09/04/2012  Findings: There is been slight increase in left basilar linear atelectasis.  The right lung is clear.  Heart size is stable. Tracheostomy and right central venous line are unchanged in position.  IMPRESSION: Slight increase in left basilar atelectasis.  No change in tracheostomy and right central venous line.   Original Report Authenticated By: Dwyane Dee, M.D.     PRN Medications:sodium chloride, acetaminophen, acetaminophen, albuterol, fentaNYL, ondansetron (ZOFRAN) IV, ondansetron, sodium  chloride, sodium chloride  Assessment  57 yo M with RLE cellulitis and multiple abscesses as well as MRSA bacteremia now with associated myasthenic crisis.   # Myasthenic Crisis - thought related to increased prednisone abruptly (given for stress dose) but possibly due to infection given crisis started <12 hours after admin high dose prednisone and typically 5-10 days after increased dose of prednisone. .  - Neurology following and we greatly appreciate their help in managing this patient.  Will appreciate recommendations regarding course of Prednisone. - Now s/p 5 day course of IV IG and continued on high dose prednisone 70mg  in addition to pyridostigmine via neuro recommendations.    #MRSA bacteremia-likely source RLE cellulitis and multiple abscess. MRI tib/fib showed cellulitis, with no  infiltration to muscle/bone.  - ID following and we greatly appreciate their help in managing this patient. -TEE resulted with 65% EF, trivial PR and AI. No endocarditis. Will await ID rec's on course.  - Will continue Vanc per pharmacy (Day 10 of Vanc)  # H/o DVT - Coumadin per pharmacy.   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 - SLP consulted for [place]ir valve assessment  FEN/GI -  Currently NPO. Resume Peg feeds Ppx- coumadin per pharmacy. Dispo - pending continued clinical improvement. When stable will be discharged to [facility].   Code: FULL code Contact: Avyon Herendeen, father: [phone]   Felix Pacini, DO Family Medicine PGY-1

## 2012-09-10 NOTE — Progress Notes (Signed)
Pt. Stated that he was getting tired & having difficulty breathing & requested to go back on the vent overnight. Dr. Marchelle Gearing was made aware of this & gave orders to place pt. On full vent support overnight. PRVC, 620, 20, 40%, +5. Pt. Is tolerating current vent settings well at this time. RT will continue to monitor pt.

## 2012-09-10 NOTE — Progress Notes (Signed)
PULMONARY  / CRITICAL CARE MEDICINE  Name: Victor Durham MRN: [id number] DOB: 09/21/1955    ADMISSION DATE:  08/31/2012 CONSULTATION DATE:  09/03/2012  REFERRING MD :  FPTS  CHIEF COMPLAINT:  Hypercarbic respiratory failure  BRIEF PATIENT DESCRIPTION: 41 yowm with myasthenia gravis and chronic PEG/ tracheostomy admitted 3/23 with RLE cellulitis and G+ bacteremia.  Became obtunded on 3/26 and ABG reveled profound hypercarbia / acidosis.  Transferred to ICU.  SIGNIFICANT EVENTS / STUDIES:  3/23  Admitted with RLE cellulitis 3/24  RLL MRI >>> Focal subcutaneous fat soft tissue ulceration with a 13 mm pocket of pus at the inferior lateral aspect of the ulceration. No deep abscesses or myositis or osteomyelitis.  Circumferential soft tissue edema in the right lower leg which could represent cellulitis 3/26  Developed acute hypercarbic respiratory failure, transferred to ICU, cuffed tracheostomy tube placed, mechanical ventilation instituted 3/25  2D echo >>>  Poor quality for evaluation of endocarditis 3/26  Head CT >>>Neg 3/26 rx  IVIG 3/29 AM  D/c vent  4/1: TEE: no evidence of endocarditis   LINES / TUBES: Chronic tracheostomy Chronic PEG R PICC 3/25 >>>  CULTURES: Abscess 3/23 >>> MRSA Blood 3/23 >>> MRSA x2  [facility] 3/25 x 1 >>> Blood 3/26 x2  >>> Urine 3/27 > neg  ANTIBIOTICS: Vancomycin 3/23 >>>  Subjective: Marked accessory muscle use. Has been off MV x 5 days  VITAL SIGNS: Temp:  [97.3 F (36.3 C)-98.6 F (37 C)] 97.3 F (36.3 C) (04/02 0740) Pulse Rate:  [64-96] 88 (04/02 0800) Resp:  [17-30] 21 (04/02 0800) BP: (87-143)/(46-94) 134/75 mmHg (04/02 0800) SpO2:  [83 %-100 %] 97 % (04/02 0800) FiO2 (%):  [28 %-50 %] 28 % (04/02 0800)       VENTILATOR SETTINGS: Vent Mode:  [-]  FiO2 (%):  [28 %-50 %] 28 %  INTAKE / OUTPUT: Intake/Output     04/01 0701 - 04/02 0700 04/02 0701 - 04/03 0700   I.V. (mL/kg) 1100 (15.9)    Other 440    IV Piggyback     Total  Intake(mL/kg) 1540 (22.3)    Urine (mL/kg/hr) 1150 (0.7)    Total Output 1150     Net +390          Stool Occurrence 2 x     PHYSICAL EXAMINATION: General: Alert and interactive. WOB increased sig Neuro:  Follows command, cough/gag normalized. HEENT:  PERRL, tracheostomy intact. Cardiovascular:  RRR, no m/r/g. Lungs: Marked accessory muscle use.  Bilateral diminished air entry, no w/r/r. Abdomen:  Soft, nontender, bowel sounds nl, tol peg feeding  Musculoskeletal:  Edematous / erythematous RLE. Skin:  Sacral decub ulcer.  LABS:  Recent Labs Lab 09/07/12 0500 09/08/12 0645 09/09/12 0500  NA 131* 136 137  K 3.5 3.7 3.7  CL 91* 98 100  CO2 33* 32 30  BUN 25* 25* 19  CREATININE 0.53 0.53 0.47*  GLUCOSE 117* 103* 100*    Recent Labs Lab 09/08/12 0645 09/09/12 0500 09/10/12 0500  HGB 13.0 12.8* 13.5  HCT 39.4 36.7* 39.4  WBC 7.5 7.0 11.8*  PLT 229 226 234      Recent Labs Lab 09/09/12 1143 09/09/12 1810 09/09/12 2016 09/10/12 0455 09/10/12 0739  GLUCAP 138* 81 92 112* 121*   CXR:   3/28: left basilar atx   ASSESSMENT / PLAN:   1) MRSA bacteremia/ TEE negative for endocarditis on 4/1.  Followed by ID. P: - 4 weeks of IV vanc (starting from  3/25) per ID  2) MG.  Likely myasthenia crisis exacerbated by acute infection (prob #1) >No sig improvement w/ IVIG and steroids. Now back in resp failure 4/2 - Head CT negative 3/26 P:   - Prednisone and IVIg for MG per neuro, add  - to start plasma exchange 4/2, will place HD catheter - Pyridostigmine. - Fentanyl PRN.  3) Acute on chronic hypercarbic respiratory failure in setting of myasthenic crisis related to acute infection (prob 1&2).    tol tcollar since am 3/29 but developing resp distress and accessory muscle use - place back on MV, use PSV as standing vent mode as much as he can tolerate. Titrate PS for WOB - hold speech rx assess for cuff down and PMV for now   4) History of DVT. P:  - Coumadin  per pharmacy. - Trend CBC / INR.  5) Wounds:  a ) R post leg abscess: slowly improving. Followed by WOC b ) sacral stage I: per WOC c ) Right posterior chest wound has evolved into 100% yellow slough with large amt thick tan drainage and strong odor: per WOC  CC time 45 minutes  Levy Pupa, MD, PhD 09/10/2012, 11:26 AM [facility] [phone] or if no answer [phone]

## 2012-09-10 NOTE — Progress Notes (Signed)
I examined this patient and discussed the care plan with Dr Claiborne Billings and the [facility] team and agree with assessment and plan as documented in the progress note above. I saw him in the afternoon shortly after the abnormal labs with severe hypokalemia and decreased hemoglobin returned. He is alert and normally interactive. The nurse reports a recent bowel movement without signs of blood, so I suspect the blood sample was diluted by IV fluid or a similar cause. He is undergoing plasma phoresis and denies discomfort.

## 2012-09-10 NOTE — Progress Notes (Signed)
A new HD cath was placed in pt's right femoral vein. Both the red and blue ports needed to be capped off with heparin. Flushed each with 10cc NS with GBR. Flushed with Heparin 1.10ml (1000u/ml) per priming volume in the blue and red ports.  Dead end cap placed on both red and blue port, both clamps taped, red "high dose heparin" sticker placed on the cathter.  Consuello Masse

## 2012-09-10 NOTE — Progress Notes (Addendum)
Plasma Exchange completed. Pt tolerated well without issue. VS stable. Report given to [facility] on 2600. Pt has no complaints at this time  Istat completed prior to initiation, results inaccurate. Reprocessed. Ionized ca =1.12 other values WDL

## 2012-09-10 NOTE — Progress Notes (Signed)
Placed back on vent per Anders Simmonds NP to overcome increased WOB placed on PSV 10/5 40% pt tolerated well RR decreased to 18BPM from 38 accessory muscle improved SATS 95% HR 94 BPM, BNP 127/99.

## 2012-09-10 NOTE — Progress Notes (Signed)
NUTRITION FOLLOW UP  Intervention:   1.  Enteral nutrition; continue current regimen of Jevity 1.2 @ 70 mL/hr and decrease Prostat to BID to provide 2216 kcal, 124g protein, 1356 ml free water.  Nutrition Dx:   Inadequate oral intake, ongoing  Goal:  Intake to meet >90% of estimated nutrition needs. Met with TF resume  Monitor:  weight trends, lab trends, I/O's, TF tolerance   Assessment:   Pt admitted with SOB and abscesses.  Pt with h/o myastenia gravis requiring trach and PEG.  Pt initially resumed home regimen, however required changes while intubated.  Currently on trach collar.  Pt met with pt who is able to communicate via pen and paper.  Pt states that he wants to progress.  He asks about food/eating trials.  RD informed this decision is deferred to SLP/MD due to respiratory status.  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   RD discussed current TF regimen with pt which has resumed s/p TEE.  RD notes pt with ongoing support from vent prn, but pt typically on trach collar at baseline. No changes in TF regimen at this time, however RD to follow closely for prolonged vent status and needs assessment.   Residuals: 0 mL at last check.    Height: Ht Readings from Last 1 Encounters:  09/03/12 6' (1.829 m)    Weight Status:   Wt Readings from Last 1 Encounters:  09/08/12 152 lb 1.9 oz (69 kg)    Re-estimated needs:  Kcal: 2000-2200 Protein: 90-110g Fluid: 2 L/day  Skin: Stage 2 to sacrum  Diet Order:     Intake/Output Summary (Last 24 hours) at 09/10/12 1106 Last data filed at 09/10/12 0900  Gross per 24 hour  Intake   1280 ml  Output   1150 ml  Net    130 ml    Last BM: 3/29   Labs:   Recent Labs Lab 09/05/12 0441 09/06/12 0430 09/07/12 0500 09/08/12 0645 09/09/12 0500  NA 136 136 131* 136 137  K 3.4* 3.7  3.5 3.7 3.7  CL 96 94* 91* 98 100  CO2 34* 32 33* 32 30  BUN 20 26* 25* 25* 19  CREATININE 0.56 0.58 0.53 0.53 0.47*  CALCIUM 8.8 8.9 8.9 8.8 8.6  MG 1.9 2.4 2.3  --   --   PHOS 2.9 2.8 2.9  --   --   GLUCOSE 90 107* 117* 103* 100*    CBG (last 3)   Recent Labs  09/09/12 2016 09/10/12 0455 09/10/12 0739  GLUCAP 92 112* 121*    Scheduled Meds: . therapeutic plasma exchange solution   Dialysis Q1 Hr x 4  . antiseptic oral rinse  15 mL Mouth Rinse QID  . aspirin  81 mg Per Tube Daily  . chlorhexidine  15 mL Mouth Rinse BID  . feeding supplement  30 mL Per Tube BID  . insulin aspart  0-15 Units Subcutaneous Q4H  . pantoprazole sodium  40 mg Per Tube Q1200  . [START ON 09/11/2012] predniSONE  60 mg Per Tube Q breakfast  . pyridostigmine  90 mg Per Tube 5 X Daily  . sodium chloride  3 mL Intravenous Q12H  . sterile water for irrigation  200 mL Irrigation Q4H  . vancomycin  750 mg Intravenous Q8H  . warfarin  7.5 mg  Oral ONCE-1800  . Warfarin - Pharmacist Dosing Inpatient   Does not apply q1800    Continuous Infusions: . feeding supplement (JEVITY 1.2 CAL) 70 mL/hr at 09/09/12 2028    Loyce Dys, MS RD LDN Clinical Inpatient Dietitian Pager: [phone] Weekend/After hours pager: [phone]

## 2012-09-10 NOTE — Procedures (Signed)
Hemodialysis Catheter Insertion Procedure Note TRACY KINNER [id number] 1955-11-26  Procedure: Insertion of Hemodialysis Catheter Indications: Plasma Exchange  Procedure Details Consent: Obtained.  Time Out: Verified patient identification, verified procedure, site/side was marked, verified correct patient position, special equipment/implants available, medications/allergies/relevent history reviewed, required imaging and test results available.  Performed  Maximum sterile technique was used including antiseptics, cap, gloves, gown, hand hygiene, mask and sheet. Skin prep: Chlorhexidine; local anesthetic administered A antimicrobial bonded/coated triple lumen catheter was placed in the right femoral vein due to chronic trach using the Seldinger technique.  Evaluation Blood flow good Complications: No apparent complications Patient tolerated procedure well.    Procedure performed under direct supervision of Dr. Delton Coombes and with ultrasound guidance for real time vessel cannulation.      Canary Brim, NP-C [place] [facility] Pgr: [phone] or [phone]   09/10/2012, 11:58 AM  Levy Pupa, MD, PhD 09/10/2012, 12:02 PM [place] [facility] [phone] or if no answer [phone]

## 2012-09-10 NOTE — Progress Notes (Signed)
NEURO HOSPITALIST PROGRESS NOTE   SUBJECTIVE:                                                                                                                         Uncomfortable, making gestures he is unable to get a good breath.  Using a lot of accessory muscles to breath.   OBJECTIVE:                                                                                                                           Vital signs in last 24 hours: Temp:  [97.3 F (36.3 C)-98.6 F (37 C)] 97.3 F (36.3 C) (04/02 0740) Pulse Rate:  [64-96] 88 (04/02 0800) Resp:  [17-30] 21 (04/02 0800) BP: (87-143)/(46-94) 134/75 mmHg (04/02 0800) SpO2:  [83 %-100 %] 97 % (04/02 0800) FiO2 (%):  [28 %-50 %] 28 % (04/02 0800)  Intake/Output from previous day: 04/01 0701 - 04/02 0700 In: 1540 [I.V.:1100] Out: 1150 [Urine:1150] Intake/Output this shift: Total I/O In: 70 [Other:70] Out: -  Nutritional status:    Past Medical History  Diagnosis Date  . DVT (deep venous thrombosis)   . Hypertension   . High cholesterol   . Myasthenia gravis   . Anxiety   . Dysphagia 06/30/12     Neurologic Exam:  Mental Status: Alert, oriented, thought content appropriate.  He expresses he is having significant difficulty breathing using hand gestures.  Able to follow 3 step commands without difficulty. Uncomfortable and using significant accessory  muscles to breath Cranial Nerves: HY:QMVHQI fields grossly normal, pupils equal, round, reactive to light and accommodation III,IV, VI: ptosis present left eye, extra-ocular motions intact bilaterally--no diplopia when looking in far gazes V,VII: smile symmetric, facial light touch sensation normal bilaterally VIII: hearing normal bilaterally IX,X: gag reflex present XI: bilateral shoulder shrug XII: midline tongue extension Motor: Right : Upper extremity   5/5    Left:     Upper extremity   5/5  Lower extremity   5/5     Lower  extremity   5/5 Tone and bulk:normal tone throughout; no atrophy noted Sensory: Pinprick and light touch intact throughout, bilaterally Deep Tendon Reflexes: 2+ and symmetric throughout Plantars: Right: downgoing   Left:  downgoing Cerebellar: normal finger-to-nose,   CV: pulses palpable throughout    Lab Results: Lab Results  Component Value Date/Time   CHOL 223* 06/27/2009  8:22 PM   Lipid Panel No results found for this basename: CHOL, TRIG, HDL, CHOLHDL, VLDL, LDLCALC,  in the last 72 hours  Studies/Results: Dg Chest Port 1 View  09/10/2012  *RADIOLOGY REPORT*  Clinical Data: Difficulty breathing, history of myasthenia gravis  PORTABLE CHEST - 1 VIEW  Comparison: Portable chest x-ray of 09/04/2012  Findings: There is been slight increase in left basilar linear atelectasis.  The right lung is clear.  Heart size is stable. Tracheostomy and right central venous line are unchanged in position.  IMPRESSION: Slight increase in left basilar atelectasis.  No change in tracheostomy and right central venous line.   Original Report Authenticated By: Dwyane Dee, M.D.     MEDICATIONS                                                                                                                        Scheduled: . antiseptic oral rinse  15 mL Mouth Rinse QID  . aspirin  81 mg Per Tube Daily  . chlorhexidine  15 mL Mouth Rinse BID  . feeding supplement  30 mL Per Tube BID  . insulin aspart  0-15 Units Subcutaneous Q4H  . pantoprazole sodium  40 mg Per Tube Q1200  . [START ON 09/11/2012] predniSONE  60 mg Per Tube Q breakfast  . pyridostigmine  90 mg Per Tube 5 X Daily  . sodium chloride  3 mL Intravenous Q12H  . sterile water for irrigation  200 mL Irrigation Q4H  . vancomycin  750 mg Intravenous Q8H  . Warfarin - Pharmacist Dosing Inpatient   Does not apply q1800    ASSESSMENT/PLAN:                                                                                                             57 years old male with known MG admitted with myasthenic crisis in the context of acute infection.  Still on trach collar but off ventilator and breathing is very labored at this point --using significant accessory muscles to breath  5 days IVIG now finished two days ago.    Plan: 1) Will start PLEX today and decease his Prednisone to 60 mg a day (starting on 09/11/12 as he has received his dose already today).  2) PCCM following his respiratory status and will place hemodialysis catheter. They are planning to put him on  pressure support ventilation 3) Continue his Mestinon at current dose.    Assessment and plan discussed with with attending physician and they are in agreement.    Felicie Morn PA-C [facility] [phone]  09/10/2012, 9:01 AM

## 2012-09-10 NOTE — Progress Notes (Signed)
[facility]  Day # 11 vancomycin  Subjective: No new complaints   Antibiotics:  Anti-infectives   Start     Dose/Rate Route Frequency Ordered Stop   09/07/12 1800  vancomycin (VANCOCIN) 750 mg in sodium chloride 0.9 % 150 mL IVPB     750 mg 150 mL/hr over 60 Minutes Intravenous Every 8 hours 09/07/12 0939     09/06/12 1000  vancomycin (VANCOCIN) 750 mg in sodium chloride 0.9 % 150 mL IVPB  Status:  Discontinued     750 mg 150 mL/hr over 60 Minutes Intravenous Every 12 hours 09/05/12 1143 09/07/12 0939   09/04/12 0200  vancomycin (VANCOCIN) IVPB 1000 mg/200 mL premix  Status:  Discontinued     1,000 mg 200 mL/hr over 60 Minutes Intravenous Every 8 hours 09/03/12 1808 09/05/12 1052   09/03/12 1815  vancomycin (VANCOCIN) IVPB 1000 mg/200 mL premix  Status:  Discontinued     1,000 mg 200 mL/hr over 60 Minutes Intravenous  Once 09/03/12 1808 09/07/12 0939   09/03/12 1800  vancomycin (VANCOCIN) IVPB 1000 mg/200 mL premix  Status:  Discontinued     1,000 mg 200 mL/hr over 60 Minutes Intravenous Every 8 hours 09/03/12 1800 09/03/12 1808   09/02/12 1700  vancomycin (VANCOCIN) 1,250 mg in sodium chloride 0.9 % 250 mL IVPB  Status:  Discontinued     1,250 mg 166.7 mL/hr over 90 Minutes Intravenous Every 8 hours 09/02/12 1407 09/03/12 1800   09/01/12 1400  ceFAZolin (ANCEF) IVPB 2 g/50 mL premix  Status:  Discontinued     2 g 100 mL/hr over 30 Minutes Intravenous 3 times per day 09/01/12 1028 09/02/12 1439   09/01/12 1200  ceFAZolin (ANCEF) IVPB 1 g/50 mL premix  Status:  Discontinued     1 g 100 mL/hr over 30 Minutes Intravenous 4 times per day 09/01/12 1017 09/01/12 1028   08/31/12 2300  vancomycin (VANCOCIN) IVPB 1000 mg/200 mL premix  Status:  Discontinued     1,000 mg 200 mL/hr over 60 Minutes Intravenous Every 12 hours 08/31/12 1743 09/02/12 1407   08/31/12 1745  piperacillin-tazobactam (ZOSYN) IVPB 3.375 g  Status:  Discontinued     3.375 g 12.5 mL/hr  over 240 Minutes Intravenous 3 times per day 08/31/12 1735 09/01/12 1017   08/31/12 1215  vancomycin (VANCOCIN) IVPB 1000 mg/200 mL premix     1,000 mg 200 mL/hr over 60 Minutes Intravenous  Once 08/31/12 1206 08/31/12 1326      Medications: Scheduled Meds: . antiseptic oral rinse  15 mL Mouth Rinse QID  . aspirin  81 mg Per Tube Daily  . chlorhexidine  15 mL Mouth Rinse BID  . feeding supplement  30 mL Per Tube BID  . insulin aspart  0-15 Units Subcutaneous Q4H  . pantoprazole sodium  40 mg Per Tube Q1200  . [START ON 09/11/2012] predniSONE  60 mg Per Tube Q breakfast  . pyridostigmine  90 mg Per Tube 5 X Daily  . sodium chloride  3 mL Intravenous Q12H  . sterile water for irrigation  200 mL Irrigation Q4H  . vancomycin  750 mg Intravenous Q8H  . Warfarin - Pharmacist Dosing Inpatient   Does not apply q1800   Continuous Infusions: . citrate dextrose    . feeding supplement (JEVITY 1.2 CAL) 70 mL/hr at 09/09/12 2028   PRN Meds:.sodium chloride, acetaminophen, acetaminophen, acetaminophen, albuterol, calcium carbonate, calcium gluconate IVPB, calcium gluconate IVPB, calcium gluconate, diphenhydrAMINE, fentaNYL, ondansetron (ZOFRAN)  IV, ondansetron, sodium chloride, sodium chloride   Objective: Weight change:   Intake/Output Summary (Last 24 hours) at 09/10/12 1945 Last data filed at 09/10/12 1800  Gross per 24 hour  Intake   1210 ml  Output    900 ml  Net    310 ml   Blood pressure 96/56, pulse 72, temperature 98 F (36.7 C), temperature source Oral, resp. rate 23, height 6' (1.829 m), weight 152 lb 1.9 oz (69 kg), SpO2 100.00%. Temp:  [97.3 F (36.3 C)-98.3 F (36.8 C)] 98 F (36.7 C) (04/02 1600) Pulse Rate:  [66-96] 72 (04/02 1939) Resp:  [14-29] 23 (04/02 1939) BP: (91-136)/(55-94) 96/56 mmHg (04/02 1939) SpO2:  [92 %-100 %] 100 % (04/02 1939) FiO2 (%):  [28 %-40 %] 28 % (04/02 1939)  Physical Exam: General: Alert and awake, oriented x3, . HEENT: tracheal site  clean CVS regular rate, normal r,  no murmur rubs or gallops Chest: Coarse Rhonchi Abdomen: soft nontender, nondistended, normal bowel sounds, Extremities skin: Wound on back is well packed and seems to be healing well there is no evidence of residual infection in need of drainage.  Wound on right calf also healing well.  Neuro: nonfocal  Lab Results:  Recent Labs  09/09/12 0500 09/10/12 0500 09/10/12 1318  WBC 7.0 11.8*  --   HGB 12.8* 13.5 7.1*  HCT 36.7* 39.4 21.0*  PLT 226 234  --     BMET  Recent Labs  09/09/12 0500 09/10/12 1318 09/10/12 1700  NA 137 150* 138  K 3.7 2.3* 3.9  CL 100 118* 104  CO2 30  --  26  GLUCOSE 100* 91 110*  BUN 19 9 17   CREATININE 0.47* 0.30* 0.44*  CALCIUM 8.6  --  8.8    Micro Results: Recent Results (from the past 240 hour(s))  CULTURE, BLOOD (SINGLE)     Status: None   Collection Time    09/02/12  4:48 PM      Result Value Range Status   Specimen Description BLOOD RIGHT ANTECUBITAL   Final   Special Requests BOTTLES DRAWN AEROBIC ONLY 5CC   Final   Culture  Setup Time 09/02/2012 22:23   Final   Culture NO GROWTH 5 DAYS   Final   Report Status 09/08/2012 FINAL   Final  CULTURE, BLOOD (ROUTINE X 2)     Status: None   Collection Time    09/03/12  4:30 AM      Result Value Range Status   Specimen Description BLOOD LEFT ARM   Final   Special Requests BOTTLES DRAWN AEROBIC AND ANAEROBIC 10CC    Final   Culture  Setup Time 09/03/2012 08:52   Final   Culture NO GROWTH 5 DAYS   Final   Report Status 09/09/2012 FINAL   Final  CULTURE, BLOOD (ROUTINE X 2)     Status: None   Collection Time    09/03/12  4:40 AM      Result Value Range Status   Specimen Description BLOOD LEFT HAND   Final   Special Requests BOTTLES DRAWN AEROBIC AND ANAEROBIC 10CC   Final   Culture  Setup Time 09/03/2012 08:52   Final   Culture NO GROWTH 5 DAYS   Final   Report Status 09/09/2012 FINAL   Final  URINE CULTURE     Status: None   Collection Time     09/04/12  9:02 PM      Result Value Range Status  Specimen Description URINE, CATHETERIZED   Final   Special Requests NONE   Final   Culture  Setup Time 09/05/2012 03:03   Final   Colony Count NO GROWTH   Final   Culture NO GROWTH   Final   Report Status 09/06/2012 FINAL   Final    Studies/Results: Dg Chest Port 1 View  09/10/2012  *RADIOLOGY REPORT*  Clinical Data: Difficulty breathing, history of myasthenia gravis  PORTABLE CHEST - 1 VIEW  Comparison: Portable chest x-ray of 09/04/2012  Findings: There is been slight increase in left basilar linear atelectasis.  The right lung is clear.  Heart size is stable. Tracheostomy and right central venous line are unchanged in position.  IMPRESSION: Slight increase in left basilar atelectasis.  No change in tracheostomy and right central venous line.   Original Report Authenticated By: Dwyane Dee, M.D.       Assessment/Plan: Victor Durham is a 57 y.o. male with myasthenia or abscess with methicillin-resistant staph aureus bacteremia likely source being a leg abscess, back abscess that were incised drained. His blood cultures have cleared. His TEE  failed to show vegetation. He is tibia and fibula have been imaged with an MRI which he failed to show evidence of osteomyelitis. He is being followed closely by wound care.  #1 methicillin-resistant staph aureus bacteremia: --despite negative TEE I WOULD treat him with at least 4 weeks of IV vancomycin with day # 1 being date of first  negative blood culture that being [id number] and day # 28 being 09/29/12  --He will need weekly cbc, vancomycin troughs and bmp faxed to Dr. Luciana Axe @ [phone]  # 2Leg abscess:  Doing well  #3 Back abcess: also healing well  I will arrange HSFU with [place] in the next 2 weeks but will sign off for now. Please call with further questions.    LOS: 10 days   Victor Durham 09/10/2012, 7:45 PM

## 2012-09-10 NOTE — Progress Notes (Signed)
S: Patient has received his plasmapheresis this afternoon. He seems to have tolerated it well. He is currently sleeping off and on. He is getting agitated with the nurses and aides attempting to clean him up. He has had a BM in bed.   O: BP 96/56  Pulse 78  Temp(Src) 98 F (36.7 C) (Oral)  Resp 18  Ht 6' (1.829 m)  Wt 152 lb 1.9 oz (69 kg)  BMI 20.63 kg/m2  SpO2 98% Gen: Mildly agitated with exam and nurses aide attempting to clean him up.   Wounds:  - Calf: surrounding mild erythema to a dime size wound, packed with iodoform dressing. Clean dry and intact.   - Midback: mild erythema surrounding a quarter size, packed wound with iodoform dressing. Dry, intact               dressing with no drainage.  - Sacrum: sacral erythema, Stage 1 pressure sore, with a small (dime size)  stage 2 on the right. Covered with                pink lightly padded dressing. No drainage noted, dry and intact.   A/P: - Monitor wounds for sign of infection.  - Sacral sore is more than likely moisture dependent from incontinence (stool). Barrier cream is ordered. Needs applied at least BID.  - Follow BP changes: Currently slightly lower than his average over the last few days.

## 2012-09-11 ENCOUNTER — Inpatient Hospital Stay (HOSPITAL_COMMUNITY): Payer: Medicaid Other

## 2012-09-11 LAB — COMPREHENSIVE METABOLIC PANEL
ALT: 40 U/L (ref 0–53)
AST: 19 U/L (ref 0–37)
Albumin: 3.3 g/dL — ABNORMAL LOW (ref 3.5–5.2)
CO2: 28 mEq/L (ref 19–32)
Calcium: 8.6 mg/dL (ref 8.4–10.5)
Creatinine, Ser: 0.5 mg/dL (ref 0.50–1.35)
GFR calc non Af Amer: >90 mL/min (ref >90)
Sodium: 139 mEq/L (ref 135–145)
Total Protein: 5.9 g/dL — ABNORMAL LOW (ref 6.0–8.3)

## 2012-09-11 LAB — GLUCOSE, CAPILLARY
Glucose-Capillary: 118 mg/dL — ABNORMAL HIGH (ref 70–99)
Glucose-Capillary: 75 mg/dL (ref 70–99)
Glucose-Capillary: 88 mg/dL (ref 70–99)
Glucose-Capillary: 198 mg/dL — ABNORMAL HIGH (ref 70–99)
Glucose-Capillary: 120 mg/dL — ABNORMAL HIGH (ref 70–99)

## 2012-09-11 LAB — PROTIME-INR: Prothrombin Time: 32.5 seconds — ABNORMAL HIGH (ref 11.6–15.2)

## 2012-09-11 LAB — CBC
MCH: 30.2 pg (ref 26.0–34.0)
MCHC: 35.3 g/dL (ref 30.0–36.0)
MCV: 85.3 fL (ref 78.0–100.0)
Platelets: 194 10*3/uL (ref 150–400)
RBC: 3.88 MIL/uL — ABNORMAL LOW (ref 4.22–5.81)
RDW: 15.4 % (ref 11.5–15.5)

## 2012-09-11 MED ORDER — POTASSIUM CHLORIDE 20 MEQ/15ML (10%) PO LIQD
40.0000 meq | Freq: Once | ORAL | Status: AC
  Administered 2012-09-11: 40 meq
  Filled 2012-09-11: qty 30

## 2012-09-11 MED ORDER — SODIUM CHLORIDE 0.9 % IV BOLUS (SEPSIS)
500.0000 mL | Freq: Once | INTRAVENOUS | Status: AC
  Administered 2012-09-11: 500 mL via INTRAVENOUS

## 2012-09-11 NOTE — Progress Notes (Signed)
Respiratory therapy note-moderate distress with PT, pt pladed back on ventilator at full support, wean as tolerated.

## 2012-09-11 NOTE — Progress Notes (Signed)
I examined this patient and discussed the care plan with Dr Bradshaw and the FPTS team and agree with assessment and plan as documented in the progress note above.  

## 2012-09-11 NOTE — Progress Notes (Signed)
PULMONARY  / CRITICAL CARE MEDICINE  Name: Victor Durham MRN: [id number] DOB: 06-15-1955    ADMISSION DATE:  08/31/2012 CONSULTATION DATE:  09/03/2012  REFERRING MD :  FPTS  CHIEF COMPLAINT:  Hypercarbic respiratory failure  BRIEF PATIENT DESCRIPTION: 28 yowm with myasthenia gravis and chronic PEG/ tracheostomy admitted 3/23 with RLE cellulitis and G+ bacteremia.  Became obtunded on 3/26 and ABG reveled profound hypercarbia / acidosis.  Transferred to ICU.  SIGNIFICANT EVENTS / STUDIES:  3/23  Admitted with RLE cellulitis 3/24  RLL MRI >>> Focal subcutaneous fat soft tissue ulceration with a 13 mm pocket of pus at the inferior lateral aspect of the ulceration. No deep abscesses or myositis or osteomyelitis.  Circumferential soft tissue edema in the right lower leg which could represent cellulitis 3/26  Developed acute hypercarbic respiratory failure, transferred to ICU, cuffed tracheostomy tube placed, mechanical ventilation instituted 3/25  2D echo >>>  Poor quality for evaluation of endocarditis 3/26  Head CT >>>Neg 3/26 rx  IVIG 3/29 AM  D/c vent  4/1: TEE: no evidence of endocarditis   LINES / TUBES: Chronic tracheostomy Chronic PEG R PICC 3/25 >>>  CULTURES: Abscess 3/23 >>> MRSA Blood 3/23 >>> MRSA x2  [facility] 3/25 x 1 >>> Blood 3/26 x2  >>> Urine 3/27 > neg  ANTIBIOTICS: Vancomycin 3/23 >>>  Subjective: Requested full vent support overnight, he is doing well with that, but is ready to go pack to PS. More hypotensive than baseline last 24 hours. Otherwise voices no complaints.   VITAL SIGNS: Temp:  [98 F (36.7 C)-98.6 F (37 C)] 98.6 F (37 C) (04/03 0709) Pulse Rate:  [66-94] 67 (04/03 0709) Resp:  [14-25] 20 (04/03 0709) BP: (84-133)/(52-80) 90/52 mmHg (04/03 0709) SpO2:  [98 %-100 %] 100 % (04/03 0709) FiO2 (%):  [28 %-40 %] 40 % (04/03 0709) Weight:  [68.2 kg (150 lb 5.7 oz)] 68.2 kg (150 lb 5.7 oz) (04/03 0500)      VENTILATOR SETTINGS: Vent Mode:   [-] PRVC FiO2 (%):  [28 %-40 %] 40 % Set Rate:  [20 bmp] 20 bmp Vt Set:  [620 mL] 620 mL PEEP:  [5 cmH20] 5 cmH20 Pressure Support:  [10 cmH20] 10 cmH20 Plateau Pressure:  [14 cmH20-17 cmH20] 14 cmH20  INTAKE / OUTPUT: Intake/Output     04/02 0701 - 04/03 0700 04/03 0701 - 04/04 0700   I.V. (mL/kg) 220 (3.2)    Other 1610    NG/GT 600    IV Piggyback 150    Total Intake(mL/kg) 2580 (37.8)    Urine (mL/kg/hr) 975 (0.6)    Total Output 975     Net +1605          Stool Occurrence 2 x     PHYSICAL EXAMINATION: General: Alert and interactive. WOB increased sig Neuro:  Follows command, cough/gag normalized. HEENT:  PERRL, tracheostomy intact. Cardiovascular:  RRR, no m/r/g. Lungs: Bilateral diminished air entry, no w/r/r. Abdomen:  Soft, nontender, bowel sounds nl, tol peg feeding  Musculoskeletal:  Edematous / erythematous RLE. Skin:  Sacral decub ulcer.  LABS:  Recent Labs Lab 09/09/12 0500 09/10/12 1318 09/10/12 1700 09/11/12 0500  NA 137 150* 138 139  K 3.7 2.3* 3.9 3.0*  CL 100 118* 104 106  CO2 30  --  26 28  BUN 19 9 17 19   CREATININE 0.47* 0.30* 0.44* 0.50  GLUCOSE 100* 91 110* 111*    Recent Labs Lab 09/09/12 0500 09/10/12 0500 09/10/12 1318 09/11/12 0500  HGB 12.8* 13.5 7.1* 11.7*  HCT 36.7* 39.4 21.0* 33.1*  WBC 7.0 11.8*  --  7.9  PLT 226 234  --  194      Recent Labs Lab 09/10/12 1604 09/10/12 2004 09/11/12 0024 09/11/12 0407 09/11/12 0708  GLUCAP 119* 87 75 118* 88   CXR:   4/3: L base stable atx, developing atx R base.  ASSESSMENT / PLAN:   1) MRSA bacteremia/ TEE negative for endocarditis on 4/1.  Followed by ID. P: - 4 weeks of IV vanc (starting from 3/25) per ID  2) MG.  Likely myasthenia crisis exacerbated by acute infection (prob #1) >No sig improvement w/ IVIG and steroids. Back in resp failure 4/2 - Head CT negative 3/26 P:   - Prednisone and IVIg for MG per neuro - Plasma exchange started 4/2 -  Pyridostigmine. - Fentanyl PRN.  3) Acute on chronic hypercarbic respiratory failure in setting of myasthenic crisis related to acute infection (prob 1&2).   - Use PSV as standing vent mode as much as he can tolerate. Titrate PS for WOB - hold speech rx assess for cuff down and PMV for now  4) Hypotension presume hypovolemia - NS Bolus   5) History of DVT. P:  - Coumadin per pharmacy. - Trend CBC / INR.  6) Wounds:  a ) R post leg abscess: slowly improving. Followed by [facility] b ) sacral stage I: per [facility]OC c ) Right posterior chest wound has evolved into 100% yellow slough with large amt thick tan drainage and strong odor: per [facility]  7) Hypokalemia - Replace K as needed  Levy Pupa, MD, PhD 09/11/2012, 10:39 AM [facility] [phone] or if no answer [phone]

## 2012-09-11 NOTE — Progress Notes (Signed)
Subjective: No complaints. Was placed on mechanical ventilation yesterday and is much more comfortable. Patient had his first course of plasmapheresis yesterday and is feeling subjectively stronger.  Objective: Current vital signs: BP 81/49  Pulse 66  Temp(Src) 98.6 F (37 C) (Oral)  Resp 20  Ht 6' (1.829 m)  Wt 68.2 kg (150 lb 5.7 oz)  BMI 20.39 kg/m2  SpO2 100%  Neurologic Exam: Alert and in no acute distress. Breathing spontaneously at about 12 breaths per minute. Mild ptosis of right eyelid and moderate ptosis of left eye lid noted at rest. Extraocular movements were full with no diplopia in any field of gaze. Moderately severe upper and lower facial weakness. Strength of extremities was normal throughout.  Lab Results: Results for orders placed during the hospital encounter of 08/31/12 (from the past 48 hour(s))  GLUCOSE, CAPILLARY     Status: Abnormal   Collection Time    09/09/12 11:43 AM      Result Value Range   Glucose-Capillary 138 (*) 70 - 99 mg/dL   Comment 1 Notify RN    GLUCOSE, CAPILLARY     Status: None   Collection Time    09/09/12  6:10 PM      Result Value Range   Glucose-Capillary 81  70 - 99 mg/dL   Comment 1 Notify RN    GLUCOSE, CAPILLARY     Status: None   Collection Time    09/09/12  8:16 PM      Result Value Range   Glucose-Capillary 92  70 - 99 mg/dL  BLOOD GAS, ARTERIAL     Status: Abnormal   Collection Time    09/10/12 12:05 AM      Result Value Range   FIO2 0.28     Delivery systems TRACH COLLAR/TRACH TUBE     pH, Arterial 7.437  7.350 - 7.450   pCO2 arterial 41.6  35.0 - 45.0 mmHg   pO2, Arterial 68.3 (*) 80.0 - 100.0 mmHg   Bicarbonate 27.6 (*) 20.0 - 24.0 mEq/L   TCO2 28.9  0 - 100 mmol/L   Acid-Base Excess 3.7 (*) 0.0 - 2.0 mmol/L   O2 Saturation 94.5     Patient temperature 98.6     Collection site LEFT RADIAL     Drawn by [id number]     Sample type ARTERIAL DRAW     Allens test (pass/fail) PASS  PASS  GLUCOSE, CAPILLARY      Status: Abnormal   Collection Time    09/10/12  4:55 AM      Result Value Range   Glucose-Capillary 112 (*) 70 - 99 mg/dL  PROTIME-INR     Status: Abnormal   Collection Time    09/10/12  5:00 AM      Result Value Range   Prothrombin Time 22.0 (*) 11.6 - 15.2 seconds   INR 2.01 (*) 0.00 - 1.49  CBC     Status: Abnormal   Collection Time    09/10/12  5:00 AM      Result Value Range   WBC 11.8 (*) 4.0 - 10.5 K/uL   RBC 4.48  4.22 - 5.81 MIL/uL   Hemoglobin 13.5  13.0 - 17.0 g/dL   HCT 57.8  46.9 - 62.9 %   MCV 87.9  78.0 - 100.0 fL   MCH 30.1  26.0 - 34.0 pg   MCHC 34.3  30.0 - 36.0 g/dL   RDW 52.8  41.3 - 24.4 %   Platelets 234  150 - 400 K/uL  GLUCOSE, CAPILLARY     Status: Abnormal   Collection Time    09/10/12  7:39 AM      Result Value Range   Glucose-Capillary 121 (*) 70 - 99 mg/dL  GLUCOSE, CAPILLARY     Status: Abnormal   Collection Time    09/10/12 12:08 PM      Result Value Range   Glucose-Capillary 125 (*) 70 - 99 mg/dL  POCT I-STAT, CHEM 8     Status: Abnormal   Collection Time    09/10/12  1:18 PM      Result Value Range   Sodium 150 (*) 135 - 145 mEq/L   Potassium 2.3 (*) 3.5 - 5.1 mEq/L   Chloride 118 (*) 96 - 112 mEq/L   BUN 9  6 - 23 mg/dL   Creatinine, Ser 1.61 (*) 0.50 - 1.35 mg/dL   Glucose, Bld 91  70 - 99 mg/dL   Calcium, Ion 0.96 (*) 1.12 - 1.23 mmol/L   TCO2 17  0 - 100 mmol/L   Hemoglobin 7.1 (*) 13.0 - 17.0 g/dL   HCT 04.5 (*) 40.9 - 81.1 %  GLUCOSE, CAPILLARY     Status: Abnormal   Collection Time    09/10/12  4:04 PM      Result Value Range   Glucose-Capillary 119 (*) 70 - 99 mg/dL  BASIC METABOLIC PANEL     Status: Abnormal   Collection Time    09/10/12  5:00 PM      Result Value Range   Sodium 138  135 - 145 mEq/L   Comment: DELTA CHECK NOTED   Potassium 3.9  3.5 - 5.1 mEq/L   Comment: DELTA CHECK NOTED   Chloride 104  96 - 112 mEq/L   Comment: DELTA CHECK NOTED   CO2 26  19 - 32 mEq/L   Glucose, Bld 110 (*) 70 - 99 mg/dL    BUN 17  6 - 23 mg/dL   Creatinine, Ser 9.14 (*) 0.50 - 1.35 mg/dL   Calcium 8.8  8.4 - 78.2 mg/dL   GFR calc non Af Amer >90  >90 mL/min   GFR calc Af Amer >90  >90 mL/min   Comment:            The eGFR has been calculated     using the CKD EPI equation.     This calculation has not been     validated in all clinical     situations.     eGFR's persistently     <90 mL/min signify     possible Chronic Kidney Disease.  GLUCOSE, CAPILLARY     Status: None   Collection Time    09/10/12  8:04 PM      Result Value Range   Glucose-Capillary 87  70 - 99 mg/dL  GLUCOSE, CAPILLARY     Status: None   Collection Time    09/11/12 12:24 AM      Result Value Range   Glucose-Capillary 75  70 - 99 mg/dL  GLUCOSE, CAPILLARY     Status: Abnormal   Collection Time    09/11/12  4:07 AM      Result Value Range   Glucose-Capillary 118 (*) 70 - 99 mg/dL  PROTIME-INR     Status: Abnormal   Collection Time    09/11/12  5:00 AM      Result Value Range   Prothrombin Time 32.5 (*) 11.6 - 15.2 seconds  INR 3.41 (*) 0.00 - 1.49  CBC     Status: Abnormal   Collection Time    09/11/12  5:00 AM      Result Value Range   WBC 7.9  4.0 - 10.5 K/uL   RBC 3.88 (*) 4.22 - 5.81 MIL/uL   Hemoglobin 11.7 (*) 13.0 - 17.0 g/dL   Comment: REPEATED TO VERIFY   HCT 33.1 (*) 39.0 - 52.0 %   MCV 85.3  78.0 - 100.0 fL   MCH 30.2  26.0 - 34.0 pg   MCHC 35.3  30.0 - 36.0 g/dL   RDW 81.1  91.4 - 78.2 %   Platelets 194  150 - 400 K/uL  COMPREHENSIVE METABOLIC PANEL     Status: Abnormal   Collection Time    09/11/12  5:00 AM      Result Value Range   Sodium 139  135 - 145 mEq/L   Potassium 3.0 (*) 3.5 - 5.1 mEq/L   Comment: DELTA CHECK NOTED   Chloride 106  96 - 112 mEq/L   CO2 28  19 - 32 mEq/L   Glucose, Bld 111 (*) 70 - 99 mg/dL   BUN 19  6 - 23 mg/dL   Creatinine, Ser 9.56  0.50 - 1.35 mg/dL   Calcium 8.6  8.4 - 21.3 mg/dL   Total Protein 5.9 (*) 6.0 - 8.3 g/dL   Albumin 3.3 (*) 3.5 - 5.2 g/dL   AST 19   0 - 37 U/L   ALT 40  0 - 53 U/L   Alkaline Phosphatase 38 (*) 39 - 117 U/L   Total Bilirubin 0.9  0.3 - 1.2 mg/dL   GFR calc non Af Amer >90  >90 mL/min   GFR calc Af Amer >90  >90 mL/min   Comment:            The eGFR has been calculated     using the CKD EPI equation.     This calculation has not been     validated in all clinical     situations.     eGFR's persistently     <90 mL/min signify     possible Chronic Kidney Disease.  GLUCOSE, CAPILLARY     Status: None   Collection Time    09/11/12  7:08 AM      Result Value Range   Glucose-Capillary 88  70 - 99 mg/dL    Studies/Results: Dg Chest Port 1 View  09/11/2012  *RADIOLOGY REPORT*  Clinical Data: Shortness of breath.  Tracheostomy.  PORTABLE CHEST - 1 VIEW  Comparison: 09/10/2012  Findings: The tracheostomy tube with tip measuring 7.1 cm above the carina.  Right PICC catheter is unchanged in position with tip over the upper SVC region.  Shallow inspiration.  Normal heart size and pulmonary vascularity.  There is developing infiltration or atelectasis in the right lung base with stable linear atelectasis in the left lung base.  No pneumothorax.  IMPRESSION: Developing atelectasis or infiltration in the right lung base. Stable linear atelectasis in the left lung base.   Original Report Authenticated By: Burman Nieves, M.D.    Dg Chest Port 1 View  09/10/2012  *RADIOLOGY REPORT*  Clinical Data: Difficulty breathing, history of myasthenia gravis  PORTABLE CHEST - 1 VIEW  Comparison: Portable chest x-ray of 09/04/2012  Findings: There is been slight increase in left basilar linear atelectasis.  The right lung is clear.  Heart size is stable. Tracheostomy and right central venous  line are unchanged in position.  IMPRESSION: Slight increase in left basilar atelectasis.  No change in tracheostomy and right central venous line.   Original Report Authenticated By: Dwyane Dee, M.D.     Medications:  I have reviewed the patient's current  medications. Scheduled: . antiseptic oral rinse  15 mL Mouth Rinse QID  . aspirin  81 mg Per Tube Daily  . chlorhexidine  15 mL Mouth Rinse BID  . feeding supplement  30 mL Per Tube BID  . insulin aspart  0-15 Units Subcutaneous Q4H  . pantoprazole sodium  40 mg Per Tube Q1200  . potassium chloride  40 mEq Per Tube Once  . predniSONE  60 mg Per Tube Q breakfast  . pyridostigmine  90 mg Per Tube 5 X Daily  . sodium chloride  500 mL Intravenous Once  . sodium chloride  3 mL Intravenous Q12H  . sterile water for irrigation  200 mL Irrigation Q4H  . vancomycin  750 mg Intravenous Q8H  . Warfarin - Pharmacist Dosing Inpatient   Does not apply q1800   Continuous: . citrate dextrose    . feeding supplement (JEVITY 1.2 CAL) 1,000 mL (09/11/12 0000)   JXB:JYNWGN chloride, acetaminophen, acetaminophen, acetaminophen, albuterol, calcium carbonate, calcium gluconate IVPB, calcium gluconate IVPB, calcium gluconate, diphenhydrAMINE, fentaNYL, ondansetron (ZOFRAN) IV, ondansetron, sodium chloride, sodium chloride  Assessment/Plan: Exacerbation of myasthenia gravis most likely related to current infectious process, as well as increase in prednisone dose. Vancomycin may also be contributing to patient's increase in weakness. Patient had a course of IV Ig with only minimal benefit. There appears to be early improvement with plasmapheresis. Respiratory status has improved. No clear improvement in bulbar weakness has been seen so far.  Recommendations: 1. Continue plasmapheresis every other day for total of 5 treatments 2. No change in current dose of Mestinon 3. Consider an antibiotic substitution for vancomycin 4. Continue with steroid taper to 50 mg per day starting today  We will continue to follow this patient closely with you.  C.R. Roseanne Reno, MD [facility]  09/11/2012  9:49 AM

## 2012-09-11 NOTE — Progress Notes (Signed)
Physical Therapy Treatment/Re-Evaluation Patient Details Name: Victor Durham MRN: [id number] DOB: 1956/03/15 Today's Date: 09/11/2012 Time: 5284-1324 PT Time Calculation (min): 33 min  PT Assessment / Plan / Recommendation Comments on Treatment Session  Patient is a 57 y/o male admitted with right LE cellulitis and respiratory distress with chronic trach and myasthenia gravis.  He presents currently with limited activity tolerance, weakness, decreased balance and anxiety with increased O2 requirement for mobility.  Spoke with respiratory therapist who recommends placing on CPAP for mobility.  Will benefit from resuming PT services in acute setting to maximize mobility and decrease burden of care.    Follow Up Recommendations  SNF     Does the patient have the potential to tolerate intense rehabilitation   No  Barriers to Discharge  None      Equipment Recommendations  None recommended by PT    Recommendations for Other Services  None  Frequency Min 2X/week   Plan Discharge plan remains appropriate    Precautions / Restrictions Precautions Precautions: Fall Precaution Comments: multiple lines, need portable O2 Restrictions Weight Bearing Restrictions: No   Pertinent Vitals/Pain Denies pain, SpO2 min 83% on 35% trach collar, RT to place pt on pressure support    Mobility  Bed Mobility Bed Mobility: Rolling Right;Scooting to HOB Rolling Right: 3: Mod assist;With rail Supine to Sit: 3: Mod assist;With rails Scooting to HOB: 1: +2 Total assist Scooting to [facility]: Patient Percentage: 10% Details for Bed Mobility Assistance: demonstrated increased WOB with rolling back to supine after right rolling for hygiene due to having BM.  Asked to be suctioned down trach and took time for RN to get to room and pt with increased anxiety and desaturation, sat up into long sitting to try and get his breath.  RT to room to place back on vent and RN assist to scoot pt up to [facility]    Exercises  Other Exercises Other Exercises: re-assessed AROM and stength in LE's: AROM limited hip/knee flexion in supine due to c/o soiled with BM; otherwise WFL; strength 4/5 knee extension, 3+/5 hip flexion   PT Diagnosis:   Generalized weakness, Difficulty walking PT Problem List:  Decreased activity tolerance, decreased strength, decreased balance PT Treatment Interventions:   Functional mobility, therapeutic exercise, gait training, balance training, pt/family education  PT Goals Acute Rehab PT Goals PT Goal Formulation: With patient Time For Goal Achievement: 09/25/12 Potential to Achieve Goals: Good Pt will go Supine/Side to Sit: with supervision PT Goal: Supine/Side to Sit - Progress: Not met Pt will go Sit to Supine/Side: with supervision PT Goal: Sit to Supine/Side - Progress: Not met Pt will go Sit to Stand: with supervision PT Goal: Sit to Stand - Progress: Not met Pt will go Stand to Sit: with supervision PT Goal: Stand to Sit - Progress: Not met Pt will Transfer Bed to Chair/Chair to Bed: with supervision PT Transfer Goal: Bed to Chair/Chair to Bed - Progress: Not met Pt will Ambulate: >150 feet;with supervision;with rolling walker PT Goal: Ambulate - Progress: Not met  Visit Information  Last PT Received On: 09/11/12    Subjective Data  Subjective: Erskin Burnet he has been having diarrhea, concerned about getting up.   Cognition  Cognition Overall Cognitive Status: Impaired Area of Impairment: Safety/judgement Arousal/Alertness: Awake/alert Behavior During Session: Anxious Safety/Judgement - Other Comments: moves quickly and when dyspneic had increased thrashing and decreased safety with multiple lines in place. Cognition - Other Comments: aware he had BM and needed to be cleaned  Balance     End of Session PT - End of Session Activity Tolerance: Treatment limited secondary to medical complications (Comment);Treatment limited secondary to agitation Patient left: in bed    GP     [facility] 09/11/2012, 11:43 AM Sheran Lawless, PT [phone] 09/11/2012

## 2012-09-11 NOTE — Progress Notes (Signed)
SLP Cancellation Note  Patient Details Name: Victor Durham MRN: [id number] DOB: 04/24/1956   Cancelled treatment:       Reason Eval/Treat Not Completed: Medical issues which prohibited therapy. Pt remains on the vent. SLP to continue attempts.    [name], Riley Nearing 09/11/2012, 10:30 AM

## 2012-09-11 NOTE — Progress Notes (Signed)
Patient ID: Victor Durham, male   DOB: [birth year], 57 y.o.   MRN: [id number] Daily Progress Note Family Medicine Teaching Service Service Pager: [phone]  Subjective:  Patient on vent this am by request last night because he felt he was too tired overnight. No complaints this am except that he is hungry. No pain.   Objective:  Temp:  [98 F (36.7 C)-98.6 F (37 C)] 98.6 F (37 C) (04/03 0709) Pulse Rate:  [66-94] 66 (04/03 0900) Resp:  [14-25] 20 (04/03 0900) BP: (81-133)/(49-80) 81/49 mmHg (04/03 0900) SpO2:  [98 %-100 %] 100 % (04/03 0900) FiO2 (%):  [28 %-40 %] 40 % (04/03 0900) Weight:  [150 lb 5.7 oz (68.2 kg)] 150 lb 5.7 oz (68.2 kg) (04/03 0500)  Intake/Output Summary (Last 24 hours) at 09/11/12 0949 Last data filed at 09/11/12 0600  Gross per 24 hour  Intake   2440 ml  Output    975 ml  Net   1465 ml   Physical Exam: General: resting comfortably in bed, sleeping, communicating through nods and hand gestures Heart: RRR. No murmurs, rubs, or gallops.  Lungs: Mild rhonchi present. Mild wheeze present LLL. Increased WOB.  Abd: PEG in place with feeds. Site clean/dry/intact., S/NT/ND Back: Dressing in place.  Clean, dry, intact.   Extremities: RLE - dressing in place; dry, intact. Neuro: moves all extremities, moves all 4 appendages easily, strength 5/5 in BL UE  Labs and imaging:  CBC  Recent Labs Lab 09/09/12 0500 09/10/12 0500 09/10/12 1318 09/11/12 0500  WBC 7.0 11.8*  --  7.9  HGB 12.8* 13.5 7.1* 11.7*  HCT 36.7* 39.4 21.0* 33.1*  PLT 226 234  --  194   BMET/CMET  Recent Labs Lab 09/09/12 0500 09/10/12 1318 09/10/12 1700 09/11/12 0500  NA 137 150* 138 139  K 3.7 2.3* 3.9 3.0*  CL 100 118* 104 106  CO2 30  --  26 28  BUN 19 9 17 19   CREATININE 0.47* 0.30* 0.44* 0.50  CALCIUM 8.6  --  8.8 8.6  PROT 8.3  --   --  5.9*  BILITOT 1.6*  --   --  0.9  ALKPHOS 68  --   --  38*  ALT 69*  --   --  40  AST 28  --   --  19  GLUCOSE 100* 91 110*  111*    Scheduled Medications: . antiseptic oral rinse  15 mL Mouth Rinse QID  . aspirin  81 mg Per Tube Daily  . chlorhexidine  15 mL Mouth Rinse BID  . feeding supplement  30 mL Per Tube BID  . insulin aspart  0-15 Units Subcutaneous Q4H  . pantoprazole sodium  40 mg Per Tube Q1200  . potassium chloride  40 mEq Per Tube Once  . predniSONE  60 mg Per Tube Q breakfast  . pyridostigmine  90 mg Per Tube 5 X Daily  . sodium chloride  500 mL Intravenous Once  . sodium chloride  3 mL Intravenous Q12H  . sterile water for irrigation  200 mL Irrigation Q4H  . vancomycin  750 mg Intravenous Q8H  . Warfarin - Pharmacist Dosing Inpatient   Does not apply q1800   Continuous Infusions: . citrate dextrose    . feeding supplement (JEVITY 1.2 CAL) 1,000 mL (09/11/12 0000)   Dg Chest Port 1 View  09/11/2012  *RADIOLOGY REPORT*  Clinical Data: Shortness of breath.  Tracheostomy.  PORTABLE CHEST - 1 VIEW  Comparison:  09/10/2012  Findings: The tracheostomy tube with tip measuring 7.1 cm above the carina.  Right PICC catheter is unchanged in position with tip over the upper SVC region.  Shallow inspiration.  Normal heart size and pulmonary vascularity.  There is developing infiltration or atelectasis in the right lung base with stable linear atelectasis in the left lung base.  No pneumothorax.  IMPRESSION: Developing atelectasis or infiltration in the right lung base. Stable linear atelectasis in the left lung base.   Original Report Authenticated By: Burman Nieves, M.D.    Dg Chest Port 1 View  09/10/2012  *RADIOLOGY REPORT*  Clinical Data: Difficulty breathing, history of myasthenia gravis  PORTABLE CHEST - 1 VIEW  Comparison: Portable chest x-ray of 09/04/2012  Findings: There is been slight increase in left basilar linear atelectasis.  The right lung is clear.  Heart size is stable. Tracheostomy and right central venous line are unchanged in position.  IMPRESSION: Slight increase in left basilar  atelectasis.  No change in tracheostomy and right central venous line.   Original Report Authenticated By: Dwyane Dee, M.D.     PRN Medications:sodium chloride, acetaminophen, acetaminophen, acetaminophen, albuterol, calcium carbonate, calcium gluconate IVPB, calcium gluconate IVPB, calcium gluconate, diphenhydrAMINE, fentaNYL, ondansetron (ZOFRAN) IV, ondansetron, sodium chloride, sodium chloride  Assessment  57 yo M with RLE cellulitis and multiple abscesses as well as MRSA bacteremia now with associated myasthenic crisis.   Myasthenic Crisis - continued/worsened now being on vent - thought related to increased prednisone abruptly (given for stress dose) but possibly due to infection given crisis started <12 hours after admin high dose prednisone and typically 5-10 days after increased dose of prednisone,  - Likely also exacerbated by MRSA bacteremia  - Neurology following and we greatly appreciate their help in managing this patient.  - Now s/p 5 day course of IV IG  - on high dose prednisone 70mg  in addition to pyridostigmine via neuro recommendations. - S/P plasma exchange on 4/2   - Pulmonology consulted- appreciate reccs and management of vent  MRSA bacteremia -likely source RLE cellulitis and multiple abscess. MRI tib/fib showed cellulitis, with no infiltration to muscle/bone.  - ID s/o, appreciate reccs -TEE resulted with 65% EF, trivial PR and AI. No endocarditis.  - 4 weeks IV vanc from 3/25 per ID, currently day 12, last day will be April 22nd  H/o DVT - Coumadin per pharmacy.   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 Dispo - pending continued clinical  improvement. When stable will be discharged to [facility].   Code: FULL code Contact: Milferd Ansell, father: [phone]   Kevin Fenton, MD Family Medicine PGY-1

## 2012-09-11 NOTE — Progress Notes (Signed)
ANTICOAGULATION/ANTIBIOTIC CONSULT NOTE - Follow Up Consult  Pharmacy Consult for coumadin and Vancomycin Indication: Recent DVT/MRSA bacteremia  No Known Allergies  Patient Measurements: Height: 6' (182.9 cm) Weight: 150 lb 5.7 oz (68.2 kg) IBW/kg (Calculated) : 77.6  Vital Signs: Temp: 98.6 F (37 C) (04/03 0709) Temp src: Oral (04/03 0709) BP: 90/52 mmHg (04/03 0709) Pulse Rate: 67 (04/03 0709)  Labs:  Recent Labs  09/09/12 0500 09/10/12 0500 09/10/12 1318 09/10/12 1700 09/11/12 0500  HGB 12.8* 13.5 7.1*  --  11.7*  HCT 36.7* 39.4 21.0*  --  33.1*  PLT 226 234  --   --  194  LABPROT 18.1* 22.0*  --   --  32.5*  INR 1.55* 2.01*  --   --  3.41*  CREATININE 0.47*  --  0.30* 0.44* 0.50    Assessment: 57 yo male on Coumadin for hx recent DVT (1/14). PTA Coumadin regimen: 7.5mg  daily except 5mg  MWF. INR on admission supratherapeutic (3.33). INR today supratherapeutic. CBC is stable.  Mr. Amero continues on Vancomycin for MRSA bacteremia. TEE negative for vegetations. Vancomycin trough was 14.4 on 4/1. Afebrile, WBC 7.9. 4 weeks of vancomycin planned per ID recommendations.  3/23 vanc>> 3/23 zosyn > 3/24 3/25 VT 5.3 3/24 ancef>>3/25 4/1 VT 14.4  3/23: MRSA [id number] 3/23: Blood:MRSA 3/23: Wound cx LE abscess: MRSA Blood 3/25 x1 >>> neg, final Blood 3/26 >>> neg, final 3/27 Urine: Neg, final  Goal of Therapy:  INR 2-3 Monitor platelets by anticoagulation protocol: Yes Vancomycin trough level 15-20 mcg/ml    Plan:  - No coumadin today - Daily INR - Continue vancomycin 750mg  IV q8hr    Celedonio Miyamoto, PharmD, BCPS Clinical Pharmacist Pager [phone]   09/11/2012 9:39 AM

## 2012-09-12 ENCOUNTER — Inpatient Hospital Stay (HOSPITAL_COMMUNITY): Payer: Medicaid Other

## 2012-09-12 DIAGNOSIS — G7089 Other specified myoneural disorders: Secondary | ICD-10-CM

## 2012-09-12 LAB — CBC
HCT: 31.8 % — ABNORMAL LOW (ref 39.0–52.0)
Hemoglobin: 11 g/dL — ABNORMAL LOW (ref 13.0–17.0)
MCHC: 34.6 g/dL (ref 30.0–36.0)
MCV: 87.1 fL (ref 78.0–100.0)
RDW: 15.6 % — ABNORMAL HIGH (ref 11.5–15.5)

## 2012-09-12 LAB — BASIC METABOLIC PANEL
BUN: 16 mg/dL (ref 6–23)
CO2: 29 mEq/L (ref 19–32)
Chloride: 103 mEq/L (ref 96–112)
Creatinine, Ser: 0.46 mg/dL — ABNORMAL LOW (ref 0.50–1.35)

## 2012-09-12 LAB — GLUCOSE, CAPILLARY
Glucose-Capillary: 115 mg/dL — ABNORMAL HIGH (ref 70–99)
Glucose-Capillary: 90 mg/dL (ref 70–99)
Glucose-Capillary: 84 mg/dL (ref 70–99)

## 2012-09-12 LAB — PROTIME-INR: INR: 2.13 — ABNORMAL HIGH (ref 0.00–1.49)

## 2012-09-12 MED ORDER — CALCIUM GLUCONATE 10 % IV SOLN: 2.0000 g | Freq: Once | INTRAVENOUS | Status: DC

## 2012-09-12 MED ORDER — SODIUM CHLORIDE 0.9 % IV SOLN: Freq: Once | INTRAVENOUS | Status: DC

## 2012-09-12 MED ORDER — POTASSIUM CHLORIDE 20 MEQ/15ML (10%) PO LIQD
ORAL | Status: AC
  Administered 2012-09-12: 20 meq
  Filled 2012-09-12: qty 30

## 2012-09-12 MED ORDER — HEPARIN SODIUM (PORCINE) 1000 UNIT/ML IJ SOLN: 1000.0000 [IU] | Freq: Once | INTRAMUSCULAR | Status: DC

## 2012-09-12 MED ORDER — SODIUM CHLORIDE 0.9 % IV SOLN: 2.0000 g | INTRAVENOUS | Status: DC | PRN

## 2012-09-12 MED ORDER — DIPHENHYDRAMINE HCL 25 MG PO CAPS: 25.0000 mg | ORAL_CAPSULE | Freq: Four times a day (QID) | ORAL | Status: DC | PRN

## 2012-09-12 MED ORDER — ACETAMINOPHEN 325 MG PO TABS: 650.0000 mg | ORAL_TABLET | ORAL | Status: DC | PRN

## 2012-09-12 MED ORDER — SODIUM CHLORIDE 0.9 % IV BOLUS (SEPSIS)
500.0000 mL | Freq: Once | INTRAVENOUS | Status: AC
  Administered 2012-09-12: 10:00:00 via INTRAVENOUS

## 2012-09-12 MED ORDER — ACD FORMULA A 0.73-2.45-2.2 GM/100ML VI SOLN
500.0000 mL | Status: DC
  Administered 2012-09-13: 500 mL via INTRAVENOUS
  Filled 2012-09-12: qty 500

## 2012-09-12 MED ORDER — SODIUM CHLORIDE 0.9 % IV SOLN
INTRAVENOUS | Status: AC
  Administered 2012-09-13 (×3): via INTRAVENOUS_CENTRAL
  Filled 2012-09-12 (×3): qty 200

## 2012-09-12 MED ORDER — SODIUM CHLORIDE 0.9 % IV SOLN
4.0000 g | INTRAVENOUS | Status: DC | PRN
  Administered 2012-09-13: 4 g via INTRAVENOUS
  Filled 2012-09-12: qty 40

## 2012-09-12 MED ORDER — CALCIUM CARBONATE ANTACID 500 MG PO CHEW
2.0000 | CHEWABLE_TABLET | ORAL | Status: DC | PRN
  Filled 2012-09-12: qty 2

## 2012-09-12 MED ORDER — WARFARIN SODIUM 7.5 MG PO TABS
7.5000 mg | ORAL_TABLET | Freq: Once | ORAL | Status: AC
  Administered 2012-09-12: 7.5 mg
  Filled 2012-09-12: qty 1

## 2012-09-12 MED ORDER — CALCIUM GLUCONATE 10 % IV SOLN: 2.0000 g | INTRAVENOUS | Status: DC | PRN

## 2012-09-12 MED ORDER — SODIUM CHLORIDE 0.9 % IV SOLN
Freq: Once | INTRAVENOUS | Status: AC
  Administered 2012-09-13: 03:00:00 via INTRAVENOUS_CENTRAL
  Filled 2012-09-12: qty 100

## 2012-09-12 MED ORDER — POTASSIUM CHLORIDE 20 MEQ/15ML (10%) PO LIQD
40.0000 meq | Freq: Once | ORAL | Status: AC
  Administered 2012-09-12: 40 meq

## 2012-09-12 NOTE — Progress Notes (Signed)
ANTICOAGULATION/ANTIBIOTIC CONSULT NOTE - Follow Up Consult  Pharmacy Consult for coumadin and Vancomycin Indication: Recent DVT/MRSA bacteremia  No Known Allergies  Patient Measurements: Height: 6' (182.9 cm) Weight: 157 lb 3 oz (71.3 kg) IBW/kg (Calculated) : 77.6  Vital Signs: Temp: 98.3 F (36.8 C) (04/04 0807) Temp src: Oral (04/04 0807) BP: 99/58 mmHg (04/04 0840) Pulse Rate: 71 (04/04 0840)  Labs:  Recent Labs  09/10/12 0500  09/10/12 1318 09/10/12 1700 09/11/12 0500 09/12/12 0500  HGB 13.5  --  7.1*  --  11.7* 11.0*  HCT 39.4  --  21.0*  --  33.1* 31.8*  PLT 234  --   --   --  194 191  LABPROT 22.0*  --   --   --  32.5* 22.9*  INR 2.01*  --   --   --  3.41* 2.13*  CREATININE  --   < > 0.30* 0.44* 0.50 0.46*  < > = values in this interval not displayed.  Assessment: 57 yo male on Coumadin for hx recent DVT (1/14). PTA Coumadin regimen: 7.5mg  daily except 5mg  MWF. INR is down to 2.1 today after holding Coumadin on 4/3.  Victor Durham continues on Vancomycin for MRSA bacteremia. TEE negative for vegetations. Vancomycin trough was 14.4 on 4/1. Afebrile, WBC 6.6. 4 weeks of vancomycin planned per ID recommendations.  3/23 vanc>> 3/23 zosyn > 3/24 3/25 VT 5.3 3/24 ancef>>3/25 4/1 VT 14.4  3/23: MRSA [id number] 3/23: Blood:MRSA 3/23: Wound cx LE abscess: MRSA Blood 3/25 x1 >>> neg, final Blood 3/26 >>> neg, final 3/27 Urine: Neg, final  Goal of Therapy:  INR 2-3 Monitor platelets by anticoagulation protocol: Yes Vancomycin trough level 15-20 mcg/ml    Plan:  - Coumadin 7.5mg  x 1 today - Daily INR - Continue vancomycin 750mg  IV q8hr    Estella Husk, Pharm.D., BCPS Clinical Pharmacist Phone: [phone] or [phone] Pager: [phone] 09/12/2012, 12:05 PM

## 2012-09-12 NOTE — Progress Notes (Signed)
Clinical Social Worker reviewed chart and staffed case with RNCM.  Pt currently on trach collar.  CSW to continue to follow and assist as needed.   [name], MSW, [place] [phone]

## 2012-09-12 NOTE — Progress Notes (Signed)
Patient ID: Victor Durham, male   DOB: Apr 08, 1956, 57 y.o.   MRN: [id number] Daily Progress Note Family Medicine Teaching Service Service Pager: [phone]  Subjective:  Patient is awake this morning and communicating with gestures and head nodes. He reports he is ok, about the same. His breathing has improved slightly. He reports his dressings were changed today.   Objective:  Temp:  [98.1 F (36.7 C)-98.3 F (36.8 C)] 98.3 F (36.8 C) (04/04 0807) Pulse Rate:  [59-98] 71 (04/04 0840) Resp:  [14-33] 22 (04/04 0840) BP: (90-113)/(51-85) 99/58 mmHg (04/04 0840) SpO2:  [83 %-100 %] 98 % (04/04 0840) FiO2 (%):  [35 %-40 %] 40 % (04/04 0840) Weight:  [157 lb 3 oz (71.3 kg)] 157 lb 3 oz (71.3 kg) (04/04 0336)  Intake/Output Summary (Last 24 hours) at 09/12/12 0924 Last data filed at 09/12/12 0813  Gross per 24 hour  Intake   2023 ml  Output   1525 ml  Net    498 ml   Physical Exam: General: resting comfortably in bed, awake and alert, communicating through nods and hand gestures Heart: RRR. No murmurs, rubs, or gallops.  Lungs: Mild rhonchi present, no wheeze or rales. Normal WOB. Abd: PEG in place with feeds. Site clean/dry/intact., S/NT/ND Back: Dressing in place.  Clean, dry, intact.   Extremities: RLE - dressing in place; dry, intact. Neuro: moves all extremities, moves all 4 appendages easily, strength 5/5 in BL UE  Labs and imaging:  CBC  Recent Labs Lab 09/10/12 0500 09/10/12 1318 09/11/12 0500 09/12/12 0500  WBC 11.8*  --  7.9 6.6  HGB 13.5 7.1* 11.7* 11.0*  HCT 39.4 21.0* 33.1* 31.8*  PLT 234  --  194 191   BMET/CMET  Recent Labs Lab 09/09/12 0500  09/10/12 1700 09/11/12 0500 09/12/12 0500  NA 137  < > 138 139 137  K 3.7  < > 3.9 3.0* 3.4*  CL 100  < > 104 106 103  CO2 30  --  26 28 29   BUN 19  < > 17 19 16   CREATININE 0.47*  < > 0.44* 0.50 0.46*  CALCIUM 8.6  --  8.8 8.6 8.4  PROT 8.3  --   --  5.9*  --   BILITOT 1.6*  --   --  0.9  --    ALKPHOS 68  --   --  38*  --   ALT 69*  --   --  40  --   AST 28  --   --  19  --   GLUCOSE 100*  < > 110* 111* 95  < > = values in this interval not displayed.  Scheduled Medications: . antiseptic oral rinse  15 mL Mouth Rinse QID  . aspirin  81 mg Per Tube Daily  . chlorhexidine  15 mL Mouth Rinse BID  . feeding supplement  30 mL Per Tube BID  . insulin aspart  0-15 Units Subcutaneous Q4H  . pantoprazole sodium  40 mg Per Tube Q1200  . predniSONE  60 mg Per Tube Q breakfast  . pyridostigmine  90 mg Per Tube 5 X Daily  . sodium chloride  3 mL Intravenous Q12H  . sterile water for irrigation  200 mL Irrigation Q4H  . vancomycin  750 mg Intravenous Q8H  . Warfarin - Pharmacist Dosing Inpatient   Does not apply q1800   Continuous Infusions: . citrate dextrose    . feeding supplement (JEVITY 1.2 CAL) 1,000 mL (  09/11/12 1718)   Dg Chest Port 1 View  09/11/2012  *RADIOLOGY REPORT*  Clinical Data: Shortness of breath.  Tracheostomy.  PORTABLE CHEST - 1 VIEW  Comparison: 09/10/2012  Findings: The tracheostomy tube with tip measuring 7.1 cm above the carina.  Right PICC catheter is unchanged in position with tip over the upper SVC region.  Shallow inspiration.  Normal heart size and pulmonary vascularity.  There is developing infiltration or atelectasis in the right lung base with stable linear atelectasis in the left lung base.  No pneumothorax.  IMPRESSION: Developing atelectasis or infiltration in the right lung base. Stable linear atelectasis in the left lung base.   Original Report Authenticated By: Burman Nieves, M.D.     PRN Medications:sodium chloride, acetaminophen, acetaminophen, acetaminophen, albuterol, calcium carbonate, calcium gluconate IVPB, calcium gluconate IVPB, calcium gluconate, diphenhydrAMINE, fentaNYL, ondansetron (ZOFRAN) IV, ondansetron, sodium chloride, sodium chloride  Assessment  57 yo M with RLE cellulitis and multiple abscesses as well as MRSA bacteremia now  with associated myasthenic crisis.   Myasthenic Crisis - appears better today. 40% O2 vent - thought related to increased prednisone abruptly (given for stress dose) but possibly due to infection given crisis started <12 hours after admin high dose prednisone and typically 5-10 days after increased dose of prednisone,  - Likely exacerbated by MRSA bacteremia  - Neurology following and we greatly appreciate their help in managing this patient.  - Now s/p 5 day course of IV IG  - on high dose prednisone 60mg  in addition to pyridostigmine via neuro recommendations. - S/P plasma exchange on 4/2, will be given every other day for x5 tx, 2nd dose today - Pulmonology consulted- appreciate reccs and management of vent  MRSA bacteremia -likely source RLE cellulitis and multiple abscess. MRI tib/fib showed cellulitis, with no infiltration to muscle/bone.  - ID s/o, appreciate reccs -TEE resulted with 65% EF, trivial PR and AI. No endocarditis.  - 4 weeks IV vanc from 3/25 per ID, last day will be April 22nd  H/o DVT - Coumadin per pharmacy.   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 Dispo - pending continued clinical improvement. When stable will be discharged to [facility]   Code: FULL code Contact: Aram Domzalski, father: [phone]; Will attempt to make contact with father over the weekend for updates and possible Dispo discussion

## 2012-09-12 NOTE — Progress Notes (Addendum)
PULMONARY  / CRITICAL CARE MEDICINE  Name: Victor Durham MRN: [id number] DOB: Jul 31, 1955    ADMISSION DATE:  08/31/2012 CONSULTATION DATE:  09/03/2012  REFERRING MD :  FPTS  CHIEF COMPLAINT:  Hypercarbic respiratory failure  BRIEF PATIENT DESCRIPTION: 32 yowm with myasthenia gravis and chronic PEG/ tracheostomy admitted 3/23 with RLE cellulitis and G+ bacteremia.  Became obtunded on 3/26 and ABG reveled profound hypercarbia / acidosis.  Transferred to ICU.  SIGNIFICANT EVENTS / STUDIES:  3/23  Admitted with RLE cellulitis 3/24  RLL MRI >>> Focal subcutaneous fat soft tissue ulceration with a 13 mm pocket of pus at the inferior lateral aspect of the ulceration. No deep abscesses or myositis or osteomyelitis.  Circumferential soft tissue edema in the right lower leg which could represent cellulitis 3/26  Developed acute hypercarbic respiratory failure, transferred to ICU, cuffed tracheostomy tube placed, mechanical ventilation instituted 3/25  2D echo >>>  Poor quality for evaluation of endocarditis 3/26  Head CT >>>Neg 3/26 rx  IVIG 3/29 AM  D/c vent  4/1: TEE: no evidence of endocarditis  4/2: Started Plasma Exchange  LINES / TUBES: Chronic tracheostomy Chronic PEG R PICC 3/25 >>> RF HD Cath 4/2 >>>  CULTURES: Abscess 3/23 >>> MRSA Blood 3/23 >>> MRSA x2  BC 3/25 x 1 - neg Blood 3/26 x2 - neg Urine 3/27 > neg  ANTIBIOTICS: Vancomycin 3/23 >>>  Subjective: Able to stay on PS all night. Currently on trach collar for 45 minutes and is breathing good. No SOB, 02 sat stable   VITAL SIGNS: Temp:  [98.1 F (36.7 C)-98.3 F (36.8 C)] 98.3 F (36.8 C) (04/04 0807) Pulse Rate:  [59-98] 71 (04/04 0840) Resp:  [14-33] 22 (04/04 0840) BP: (90-113)/(51-85) 99/58 mmHg (04/04 0840) SpO2:  [83 %-100 %] 98 % (04/04 0840) FiO2 (%):  [35 %-40 %] 40 % (04/04 0840) Weight:  [71.3 kg (157 lb 3 oz)] 71.3 kg (157 lb 3 oz) (04/04 0336)      VENTILATOR SETTINGS: Vent Mode:  [-]  PSV;CPAP FiO2 (%):  [35 %-40 %] 40 % Set Rate:  [20 bmp] 20 bmp Vt Set:  [620 mL] 620 mL PEEP:  [5 cmH20] 5 cmH20 Pressure Support:  [10 cmH20] 10 cmH20 Plateau Pressure:  [14 cmH20] 14 cmH20  INTAKE / OUTPUT: Intake/Output     04/03 0701 - 04/04 0700 04/04 0701 - 04/05 0700   I.V. (mL/kg) 443 (6.2)    Other 1610    NG/GT     IV Piggyback 150    Total Intake(mL/kg) 2203 (30.9)    Urine (mL/kg/hr) 1175 (0.7) 350 (2.2)   Total Output 1175 350   Net +1028 -350        Stool Occurrence 2 x     PHYSICAL EXAMINATION: General: Alert and interactive. NAD Neuro:  Follows command, cough/gag normalized. HEENT:  PERRL, tracheostomy intact. Cardiovascular:  RRR, no m/r/g. Lungs: Occasional rhonchi.  Abdomen:  Soft, nontender, bowel sounds nl, tol peg feeding  Musculoskeletal:  Edematous / erythematous RLE. Skin:  Sacral decub ulcer.  LABS:  Recent Labs Lab 09/10/12 1700 09/11/12 0500 09/12/12 0500  NA 138 139 137  K 3.9 3.0* 3.4*  CL 104 106 103  CO2 26 28 29   BUN 17 19 16   CREATININE 0.44* 0.50 0.46*  GLUCOSE 110* 111* 95    Recent Labs Lab 09/10/12 0500 09/10/12 1318 09/11/12 0500 09/12/12 0500  HGB 13.5 7.1* 11.7* 11.0*  HCT 39.4 21.0* 33.1* 31.8*  WBC 11.8*  --  7.9 6.6  PLT 234  --  194 191      Recent Labs Lab 09/11/12 1535 09/11/12 2015 09/12/12 0021 09/12/12 0407 09/12/12 0809  GLUCAP 120* 103* 81 87 112*   CXR:   4/3: L base stable atx, developing atx R base.  ASSESSMENT / PLAN:   1) MRSA bacteremia/ TEE negative for endocarditis on 4/1.  Followed by ID. P: - 4 weeks of IV vanc (starting from 3/25) per ID  2) MG.  Likely myasthenia crisis exacerbated by acute infection (prob #1) >now better after plasma exchange  - Head CT negative 3/26 P:   - Prednisone for MG per neuro - Plasma exchange started 4/2 - Pyridostigmine. - Fentanyl PRN.  3) Acute on chronic hypercarbic respiratory failure in setting of myasthenic crisis related to  acute infection (prob 1&2).   - Use PSV as standing vent mode as much as he can tolerate. Titrate PS for WOB - cont trach collar trials. - hold speech rx assess for cuff down and PMV for now   4) Hypotension presume hypovolemia - NS bolus.    5) History of DVT. P:  - Coumadin per pharmacy. - Trend CBC / INR.  6) Wounds:  a ) R post leg abscess: slowly improving. Followed by WOC b ) sacral stage I: per WOC c ) Right posterior chest wound has evolved into 100% yellow slough with large amt thick tan drainage and strong odor: per WOC  7) Hypokalemia - Replace K as needed   [name], MD ; [facility] service Mobile [phone].  After 5:30 PM or weekends, call [phone]

## 2012-09-12 NOTE — Progress Notes (Signed)
08:40 Placed pt on .40 ATC.  Tolerating well.  RT to monitor

## 2012-09-12 NOTE — Progress Notes (Signed)
Spoke with Dr. Frederico Hamman about pt. Remaining on CPAP/PS overnight if pt. Tolerates it. MD agreed to leave pt. On CPAP/PS overnight. Will switch to full support if pt. Gets into distress anytime during the night.

## 2012-09-12 NOTE — Progress Notes (Signed)
I examined this patient and discussed the care plan with Dr Kuneff and the [facility] team and agree with assessment and plan as documented in the progress note above.  

## 2012-09-12 NOTE — Progress Notes (Signed)
[facility]y-[facility] Speaking Valve - Treatment Patient Details  Name: Victor Durham MRN: [id number] Date of Birth: 1956/04/20  Today's Date: 09/12/2012 Time: 1028-1051 SLP Time Calculation (min): 23 min  Past Medical History:  Past Medical History  Diagnosis Date  . DVT (deep venous thrombosis)   . Hypertension   . High cholesterol   . Myasthenia gravis   . Anxiety   . Dysphagia 06/30/12   Past Surgical History:  Past Surgical History  Procedure Laterality Date  . Esophagogastroduodenoscopy  07/11/2012    Procedure: ESOPHAGOGASTRODUODENOSCOPY (EGD);  Surgeon: Shirley Friar, MD;  Location: [facility];  Service: Endoscopy;  Laterality: N/A;  BEDSIDE  . Tracheostomy N/A   . Tee without cardioversion N/A 09/09/2012    Procedure: TRANSESOPHAGEAL ECHOCARDIOGRAM (TEE);  Surgeon: Dolores Patty, MD;  Location: [facility] ENDOSCOPY;  Service: Cardiovascular;  Laterality: N/A;    Assessment / Plan / Recommendation Clinical Impression  Pt. seen for skilled treatment with PMSV.  Pt. on trach collar since approximatley 8:45 this a.m. with fully inflated cuff.  Pt. hesitant to attempt valve and wrote "I want to do it but know it will be hard."  When asked again he gestured that he did want to try.  SLP deflated cuff slightly/slowly with copious secretions present, therefore RN deep suctioned.  PMSV placed after delay of 4 minutes allowing pt. to adjust to deflated cuff.  SLP provided pt. mod-max verbal instructions for slow and deep/foreceful inhalations/exhalations.  Pt. verbalized in single and 3 word phrases with adequate vocal intensity and hoarse vocal quality.  Evidence of C02 retention audible when valve removed after 40 second intervals during 2 attempts.  Sp02 decreased slowly throughout session from 97%-93%.  PMSV removed and cuff reinflated.  Pt. with decreased ability to tolerate cuff deflation and build up of C02.  PMSV with SLP only and ST will continue to work with pt.  Pt. is motivated to  work with therapy and improve.      Plan  Continue with current plan of care    Follow Up Recommendations  Skilled Nursing facility    Pertinent Vitals/Pain none    SLP Goals Potential to Achieve Goals: Fair SLP Goal #1: Pt will tolerate PMSV during all waking hours with intermittent supervision.  SLP Goal #1 - Progress: Progressing toward goal SLP Goal #3: Pt will verbalize intelligibly at conversation level with min cues for breath support.  SLP Goal #3 - Progress: Progressing toward goal   PMSV Trial  PMSV was placed for: 30-40 seconds Able to redirect subglottic air through upper airway: Yes Able to Attain Phonation: Yes Voice Quality: Hoarse Able to Expectorate Secretions: No Breath Support for Phonation: Moderately decreased Intelligibility: Intelligible Word: 75-100% accurate Phrase: 50-74% accurate Respirations During Trial: 25 SpO2 During Trial: 93 % Pulse During Trial: 83   Tracheostomy Tube       Vent Dependency  FiO2 (%): 40 %    Cuff Deflation Trial  GO     Tolerated Cuff Deflation: No Length of Time for Cuff Deflation Trial: 8-10 min Behavior: Alert;Cooperative   Victor Durham.Ed [employer] [phone]  09/12/2012

## 2012-09-12 NOTE — Progress Notes (Addendum)
Subjective: No new complaints. No overnight events. Scheduled for second course of plasmapheresis today.  Objective: Current vital signs: BP 99/58  Pulse 73  Temp(Src) 98.3 F (36.8 C) (Oral)  Resp 18  Ht 6' (1.829 m)  Wt 71.3 kg (157 lb 3 oz)  BMI 21.31 kg/m2  SpO2 97%  Neurologic Exam: On mechanical ventilation but breathing comfortably at about 20 per minute respiratory rate. Extraocular movements full and conjugate with no diplopia. Bilateral ptosis, left greater than right, and unchanged from yesterday. Frontalis muscle strength is full 5 bilaterally. Moderately severe lower facial weakness, which is unchanged. Strength of extremities is normal proximally and distally.  Lab Results: Results for orders placed during the hospital encounter of 08/31/12 (from the past 48 hour(s))  GLUCOSE, CAPILLARY     Status: Abnormal   Collection Time    09/10/12 12:08 PM      Result Value Range   Glucose-Capillary 125 (*) 70 - 99 mg/dL  POCT I-STAT, CHEM 8     Status: Abnormal   Collection Time    09/10/12  1:18 PM      Result Value Range   Sodium 150 (*) 135 - 145 mEq/L   Potassium 2.3 (*) 3.5 - 5.1 mEq/L   Chloride 118 (*) 96 - 112 mEq/L   BUN 9  6 - 23 mg/dL   Creatinine, Ser 9.52 (*) 0.50 - 1.35 mg/dL   Glucose, Bld 91  70 - 99 mg/dL   Calcium, Ion 8.41 (*) 1.12 - 1.23 mmol/L   TCO2 17  0 - 100 mmol/L   Hemoglobin 7.1 (*) 13.0 - 17.0 g/dL   HCT 32.4 (*) 40.1 - 02.7 %  GLUCOSE, CAPILLARY     Status: Abnormal   Collection Time    09/10/12  4:04 PM      Result Value Range   Glucose-Capillary 119 (*) 70 - 99 mg/dL  BASIC METABOLIC PANEL     Status: Abnormal   Collection Time    09/10/12  5:00 PM      Result Value Range   Sodium 138  135 - 145 mEq/L   Comment: DELTA CHECK NOTED   Potassium 3.9  3.5 - 5.1 mEq/L   Comment: DELTA CHECK NOTED   Chloride 104  96 - 112 mEq/L   Comment: DELTA CHECK NOTED   CO2 26  19 - 32 mEq/L   Glucose, Bld 110 (*) 70 - 99 mg/dL   BUN 17  6  - 23 mg/dL   Creatinine, Ser 2.53 (*) 0.50 - 1.35 mg/dL   Calcium 8.8  8.4 - 66.4 mg/dL   GFR calc non Af Amer >90  >90 mL/min   GFR calc Af Amer >90  >90 mL/min   Comment:            The eGFR has been calculated     using the CKD EPI equation.     This calculation has not been     validated in all clinical     situations.     eGFR's persistently     <90 mL/min signify     possible Chronic Kidney Disease.  GLUCOSE, CAPILLARY     Status: None   Collection Time    09/10/12  8:04 PM      Result Value Range   Glucose-Capillary 87  70 - 99 mg/dL  GLUCOSE, CAPILLARY     Status: None   Collection Time    09/11/12 12:24 AM  Result Value Range   Glucose-Capillary 75  70 - 99 mg/dL  GLUCOSE, CAPILLARY     Status: Abnormal   Collection Time    09/11/12  4:07 AM      Result Value Range   Glucose-Capillary 118 (*) 70 - 99 mg/dL  PROTIME-INR     Status: Abnormal   Collection Time    09/11/12  5:00 AM      Result Value Range   Prothrombin Time 32.5 (*) 11.6 - 15.2 seconds   INR 3.41 (*) 0.00 - 1.49  CBC     Status: Abnormal   Collection Time    09/11/12  5:00 AM      Result Value Range   WBC 7.9  4.0 - 10.5 K/uL   RBC 3.88 (*) 4.22 - 5.81 MIL/uL   Hemoglobin 11.7 (*) 13.0 - 17.0 g/dL   Comment: REPEATED TO VERIFY   HCT 33.1 (*) 39.0 - 52.0 %   MCV 85.3  78.0 - 100.0 fL   MCH 30.2  26.0 - 34.0 pg   MCHC 35.3  30.0 - 36.0 g/dL   RDW 29.5  28.4 - 13.2 %   Platelets 194  150 - 400 K/uL  COMPREHENSIVE METABOLIC PANEL     Status: Abnormal   Collection Time    09/11/12  5:00 AM      Result Value Range   Sodium 139  135 - 145 mEq/L   Potassium 3.0 (*) 3.5 - 5.1 mEq/L   Comment: DELTA CHECK NOTED   Chloride 106  96 - 112 mEq/L   CO2 28  19 - 32 mEq/L   Glucose, Bld 111 (*) 70 - 99 mg/dL   BUN 19  6 - 23 mg/dL   Creatinine, Ser 4.40  0.50 - 1.35 mg/dL   Calcium 8.6  8.4 - 10.2 mg/dL   Total Protein 5.9 (*) 6.0 - 8.3 g/dL   Albumin 3.3 (*) 3.5 - 5.2 g/dL   AST 19  0 - 37 U/L    ALT 40  0 - 53 U/L   Alkaline Phosphatase 38 (*) 39 - 117 U/L   Total Bilirubin 0.9  0.3 - 1.2 mg/dL   GFR calc non Af Amer >90  >90 mL/min   GFR calc Af Amer >90  >90 mL/min   Comment:            The eGFR has been calculated     using the CKD EPI equation.     This calculation has not been     validated in all clinical     situations.     eGFR's persistently     <90 mL/min signify     possible Chronic Kidney Disease.  GLUCOSE, CAPILLARY     Status: None   Collection Time    09/11/12  7:08 AM      Result Value Range   Glucose-Capillary 88  70 - 99 mg/dL  GLUCOSE, CAPILLARY     Status: Abnormal   Collection Time    09/11/12 11:28 AM      Result Value Range   Glucose-Capillary 198 (*) 70 - 99 mg/dL  GLUCOSE, CAPILLARY     Status: Abnormal   Collection Time    09/11/12  3:35 PM      Result Value Range   Glucose-Capillary 120 (*) 70 - 99 mg/dL   Comment 1 Notify RN    GLUCOSE, CAPILLARY     Status: Abnormal   Collection Time  09/11/12  8:15 PM      Result Value Range   Glucose-Capillary 103 (*) 70 - 99 mg/dL   Comment 1 Notify RN    GLUCOSE, CAPILLARY     Status: None   Collection Time    09/12/12 12:21 AM      Result Value Range   Glucose-Capillary 81  70 - 99 mg/dL   Comment 1 Notify RN    GLUCOSE, CAPILLARY     Status: None   Collection Time    09/12/12  4:07 AM      Result Value Range   Glucose-Capillary 87  70 - 99 mg/dL   Comment 1 Notify RN    PROTIME-INR     Status: Abnormal   Collection Time    09/12/12  5:00 AM      Result Value Range   Prothrombin Time 22.9 (*) 11.6 - 15.2 seconds   INR 2.13 (*) 0.00 - 1.49  CBC     Status: Abnormal   Collection Time    09/12/12  5:00 AM      Result Value Range   WBC 6.6  4.0 - 10.5 K/uL   RBC 3.65 (*) 4.22 - 5.81 MIL/uL   Hemoglobin 11.0 (*) 13.0 - 17.0 g/dL   HCT 91.4 (*) 78.2 - 95.6 %   MCV 87.1  78.0 - 100.0 fL   MCH 30.1  26.0 - 34.0 pg   MCHC 34.6  30.0 - 36.0 g/dL   RDW 21.3 (*) 08.6 - 57.8 %    Platelets 191  150 - 400 K/uL  BASIC METABOLIC PANEL     Status: Abnormal   Collection Time    09/12/12  5:00 AM      Result Value Range   Sodium 137  135 - 145 mEq/L   Potassium 3.4 (*) 3.5 - 5.1 mEq/L   Chloride 103  96 - 112 mEq/L   CO2 29  19 - 32 mEq/L   Glucose, Bld 95  70 - 99 mg/dL   BUN 16  6 - 23 mg/dL   Creatinine, Ser 4.69 (*) 0.50 - 1.35 mg/dL   Calcium 8.4  8.4 - 62.9 mg/dL   GFR calc non Af Amer >90  >90 mL/min   GFR calc Af Amer >90  >90 mL/min   Comment:            The eGFR has been calculated     using the CKD EPI equation.     This calculation has not been     validated in all clinical     situations.     eGFR's persistently     <90 mL/min signify     possible Chronic Kidney Disease.  GLUCOSE, CAPILLARY     Status: Abnormal   Collection Time    09/12/12  8:09 AM      Result Value Range   Glucose-Capillary 112 (*) 70 - 99 mg/dL   Comment 1 Notify RN      Studies/Results: Dg Chest Port 1 View  09/11/2012  *RADIOLOGY REPORT*  Clinical Data: Shortness of breath.  Tracheostomy.  PORTABLE CHEST - 1 VIEW  Comparison: 09/10/2012  Findings: The tracheostomy tube with tip measuring 7.1 cm above the carina.  Right PICC catheter is unchanged in position with tip over the upper SVC region.  Shallow inspiration.  Normal heart size and pulmonary vascularity.  There is developing infiltration or atelectasis in the right lung base with stable linear atelectasis in the left  lung base.  No pneumothorax.  IMPRESSION: Developing atelectasis or infiltration in the right lung base. Stable linear atelectasis in the left lung base.   Original Report Authenticated By: Burman Nieves, M.D.     Medications:  I have reviewed the patient's current medications. Scheduled: . antiseptic oral rinse  15 mL Mouth Rinse QID  . aspirin  81 mg Per Tube Daily  . chlorhexidine  15 mL Mouth Rinse BID  . feeding supplement  30 mL Per Tube BID  . insulin aspart  0-15 Units Subcutaneous Q4H  .  pantoprazole sodium  40 mg Per Tube Q1200  . predniSONE  60 mg Per Tube Q breakfast  . pyridostigmine  90 mg Per Tube 5 X Daily  . sodium chloride  3 mL Intravenous Q12H  . sterile water for irrigation  200 mL Irrigation Q4H  . vancomycin  750 mg Intravenous Q8H  . Warfarin - Pharmacist Dosing Inpatient   Does not apply q1800   Continuous: . citrate dextrose    . feeding supplement (JEVITY 1.2 CAL) 1,000 mL (09/11/12 1718)   ZOX:WRUEAV chloride, acetaminophen, acetaminophen, acetaminophen, albuterol, calcium carbonate, calcium gluconate IVPB, calcium gluconate IVPB, calcium gluconate, diphenhydrAMINE, fentaNYL, ondansetron (ZOFRAN) IV, ondansetron, sodium chloride, sodium chloride  Assessment/Plan: Myasthenia gravis exacerbation with slight improvement in bulbar muscle strength over the past couple days. Likely etiology is current infectious process, as well as increase in steroids and possible neuromuscular side effect of vancomycin.  Recommend no changes in current management. Consider substitution for vancomycin if possible. Plasmapheresis to continue for total of 5 treatments.  C.R. Roseanne Reno, MD [facility] [phone]  09/12/2012  8:36 AM

## 2012-09-13 LAB — BASIC METABOLIC PANEL
BUN: 16 mg/dL (ref 6–23)
CO2: 26 mEq/L (ref 19–32)
Calcium: 8.5 mg/dL (ref 8.4–10.5)
Creatinine, Ser: 0.46 mg/dL — ABNORMAL LOW (ref 0.50–1.35)

## 2012-09-13 LAB — CBC
HCT: 32.6 % — ABNORMAL LOW (ref 39.0–52.0)
MCH: 29.6 pg (ref 26.0–34.0)
MCV: 87.6 fL (ref 78.0–100.0)
Platelets: 174 10*3/uL (ref 150–400)
RBC: 3.72 MIL/uL — ABNORMAL LOW (ref 4.22–5.81)
RDW: 15.9 % — ABNORMAL HIGH (ref 11.5–15.5)

## 2012-09-13 LAB — GLUCOSE, CAPILLARY: Glucose-Capillary: 93 mg/dL (ref 70–99)

## 2012-09-13 MED ORDER — CALCIUM GLUCONATE 10 % IV SOLN: 2.0000 g | Freq: Once | INTRAVENOUS | Status: DC

## 2012-09-13 MED ORDER — DIPHENHYDRAMINE HCL 25 MG PO CAPS: 25.0000 mg | ORAL_CAPSULE | Freq: Four times a day (QID) | ORAL | Status: DC | PRN

## 2012-09-13 MED ORDER — PREDNISONE 50 MG PO TABS
50.0000 mg | ORAL_TABLET | Freq: Every day | ORAL | Status: DC
  Administered 2012-09-14 – 2012-09-16 (×3): 50 mg
  Filled 2012-09-13 (×4): qty 1

## 2012-09-13 MED ORDER — CALCIUM CARBONATE ANTACID 500 MG PO CHEW
2.0000 | CHEWABLE_TABLET | ORAL | Status: DC | PRN
  Filled 2012-09-13: qty 2

## 2012-09-13 MED ORDER — SODIUM CHLORIDE 0.9 % IV SOLN
INTRAVENOUS | Status: AC
  Administered 2012-09-14 (×3): via INTRAVENOUS_CENTRAL
  Filled 2012-09-13 (×3): qty 200

## 2012-09-13 MED ORDER — CALCIUM CARBONATE ANTACID 500 MG PO CHEW
2.0000 | CHEWABLE_TABLET | ORAL | Status: AC
  Filled 2012-09-13 (×2): qty 2

## 2012-09-13 MED ORDER — CALCIUM GLUCONATE 10 % IV SOLN: 2.0000 g | INTRAVENOUS | Status: DC | PRN

## 2012-09-13 MED ORDER — SODIUM CHLORIDE 0.9 % IV SOLN: Freq: Once | INTRAVENOUS | Status: DC

## 2012-09-13 MED ORDER — HEPARIN SODIUM (PORCINE) 1000 UNIT/ML IJ SOLN
1000.0000 [IU] | Freq: Once | INTRAMUSCULAR | Status: AC
  Administered 2012-09-14: 2800 [IU]

## 2012-09-13 MED ORDER — SODIUM CHLORIDE 0.9 % IV SOLN
4.0000 g | INTRAVENOUS | Status: DC | PRN
  Administered 2012-09-14: 4 g via INTRAVENOUS
  Filled 2012-09-13: qty 40

## 2012-09-13 MED ORDER — ACD FORMULA A 0.73-2.45-2.2 GM/100ML VI SOLN
500.0000 mL | Status: DC
  Filled 2012-09-13: qty 500

## 2012-09-13 MED ORDER — SODIUM CHLORIDE 0.9 % IV SOLN
INTRAVENOUS | Status: DC
  Filled 2012-09-13 (×4): qty 200

## 2012-09-13 MED ORDER — ACETAMINOPHEN 325 MG PO TABS: 650.0000 mg | ORAL_TABLET | ORAL | Status: DC | PRN

## 2012-09-13 MED ORDER — HEPARIN SODIUM (PORCINE) 1000 UNIT/ML IJ SOLN: 1000.0000 [IU] | Freq: Once | INTRAMUSCULAR | Status: AC

## 2012-09-13 MED ORDER — WARFARIN SODIUM 5 MG PO TABS
5.0000 mg | ORAL_TABLET | Freq: Once | ORAL | Status: AC
  Administered 2012-09-13: 5 mg via ORAL
  Filled 2012-09-13: qty 1

## 2012-09-13 MED ORDER — SODIUM CHLORIDE 0.9 % IV SOLN
Freq: Once | INTRAVENOUS | Status: AC
  Administered 2012-09-14: 04:00:00 via INTRAVENOUS_CENTRAL
  Filled 2012-09-13: qty 100

## 2012-09-13 MED ORDER — ACD FORMULA A 0.73-2.45-2.2 GM/100ML VI SOLN
Status: AC
  Filled 2012-09-13: qty 500

## 2012-09-13 NOTE — Progress Notes (Signed)
I discussed the care plan with Dr Claiborne Billings and the [facility] team and agree with assessment and plan as documented in the progress note above.

## 2012-09-13 NOTE — Progress Notes (Signed)
ANTICOAGULATION CONSULT NOTE - Follow Up Consult  Pharmacy Consult for Coumadin  Indication: Recent DVT  No Known Allergies  Patient Measurements: Height: 6' (182.9 cm) Weight: 155 lb 13.8 oz (70.7 kg) IBW/kg (Calculated) : 77.6  Vital Signs: Temp: 98 F (36.7 C) (04/05 0851) Temp src: Oral (04/05 0400) BP: 102/52 mmHg (04/05 0800) Pulse Rate: 72 (04/05 0800)  Labs:  Recent Labs  09/11/12 0500 09/12/12 0500 09/13/12 0823  HGB 11.7* 11.0* 11.0*  HCT 33.1* 31.8* 32.6*  PLT 194 191 174  LABPROT 32.5* 22.9* 25.2*  INR 3.41* 2.13* 2.42*  CREATININE 0.50 0.46* 0.46*    Assessment: 58 yo male on Coumadin for hx recent DVT (1/14). INR on admit was supratherapeutic. PTA Coumadin regimen: 7.5mg  daily except 5mg  MWF. INR is therapeutic at 2.42 today. CBC is stable. No bleeding noted.   Goal of Therapy:  INR 2-3 Monitor platelets by anticoagulation protocol: Yes  Plan:  - Coumadin 5mg  x 1 today - Daily INR  Thank you, Franchot Erichsen, Pharm.D. Clinical Pharmacist   Pager: [phone] 09/13/2012 9:59 AM

## 2012-09-13 NOTE — Progress Notes (Signed)
Subjective: No new complaints. Thanks he is slightly stronger compared to yesterday. No overnight adverse events.  Objective: Current vital signs: BP 102/52  Pulse 72  Temp(Src) 98 F (36.7 C) (Oral)  Resp 16  Ht 6' (1.829 m)  Wt 70.7 kg (155 lb 13.8 oz)  BMI 21.13 kg/m2  SpO2 96%  Neurologic Exam: Alert and in no acute distress. Mental status was normal. Patient was breathing comfortably although still on mechanical ventilation, he is breathing well on his own. Extraocular movements were full and conjugate with no diplopia in any field of gaze. 4/5 strength of the frontalis muscle bilaterally Able to partially bury eyelashes on forceful eye closure. Smile was symmetrical with teeth visible. Strength of extremities proximally and distally remains normal.  Lab Results: Results for orders placed during the hospital encounter of 08/31/12 (from the past 48 hour(s))  GLUCOSE, CAPILLARY     Status: Abnormal   Collection Time    09/11/12 11:28 AM      Result Value Range   Glucose-Capillary 198 (*) 70 - 99 mg/dL  GLUCOSE, CAPILLARY     Status: Abnormal   Collection Time    09/11/12  3:35 PM      Result Value Range   Glucose-Capillary 120 (*) 70 - 99 mg/dL   Comment 1 Notify RN    GLUCOSE, CAPILLARY     Status: Abnormal   Collection Time    09/11/12  8:15 PM      Result Value Range   Glucose-Capillary 103 (*) 70 - 99 mg/dL   Comment 1 Notify RN    GLUCOSE, CAPILLARY     Status: None   Collection Time    09/12/12 12:21 AM      Result Value Range   Glucose-Capillary 81  70 - 99 mg/dL   Comment 1 Notify RN    GLUCOSE, CAPILLARY     Status: None   Collection Time    09/12/12  4:07 AM      Result Value Range   Glucose-Capillary 87  70 - 99 mg/dL   Comment 1 Notify RN    PROTIME-INR     Status: Abnormal   Collection Time    09/12/12  5:00 AM      Result Value Range   Prothrombin Time 22.9 (*) 11.6 - 15.2 seconds   INR 2.13 (*) 0.00 - 1.49  CBC     Status: Abnormal    Collection Time    09/12/12  5:00 AM      Result Value Range   WBC 6.6  4.0 - 10.5 K/uL   RBC 3.65 (*) 4.22 - 5.81 MIL/uL   Hemoglobin 11.0 (*) 13.0 - 17.0 g/dL   HCT 91.4 (*) 78.2 - 95.6 %   MCV 87.1  78.0 - 100.0 fL   MCH 30.1  26.0 - 34.0 pg   MCHC 34.6  30.0 - 36.0 g/dL   RDW 21.3 (*) 08.6 - 57.8 %   Platelets 191  150 - 400 K/uL  BASIC METABOLIC PANEL     Status: Abnormal   Collection Time    09/12/12  5:00 AM      Result Value Range   Sodium 137  135 - 145 mEq/L   Potassium 3.4 (*) 3.5 - 5.1 mEq/L   Chloride 103  96 - 112 mEq/L   CO2 29  19 - 32 mEq/L   Glucose, Bld 95  70 - 99 mg/dL   BUN 16  6 - 23 mg/dL  Creatinine, Ser 0.46 (*) 0.50 - 1.35 mg/dL   Calcium 8.4  8.4 - 78.2 mg/dL   GFR calc non Af Amer >90  >90 mL/min   GFR calc Af Amer >90  >90 mL/min   Comment:            The eGFR has been calculated     using the CKD EPI equation.     This calculation has not been     validated in all clinical     situations.     eGFR's persistently     <90 mL/min signify     possible Chronic Kidney Disease.  GLUCOSE, CAPILLARY     Status: Abnormal   Collection Time    09/12/12  8:09 AM      Result Value Range   Glucose-Capillary 112 (*) 70 - 99 mg/dL   Comment 1 Notify RN    GLUCOSE, CAPILLARY     Status: Abnormal   Collection Time    09/12/12 12:15 PM      Result Value Range   Glucose-Capillary 115 (*) 70 - 99 mg/dL   Comment 1 Notify RN    GLUCOSE, CAPILLARY     Status: None   Collection Time    09/12/12  4:06 PM      Result Value Range   Glucose-Capillary 90  70 - 99 mg/dL   Comment 1 Notify RN    GLUCOSE, CAPILLARY     Status: None   Collection Time    09/12/12  8:11 PM      Result Value Range   Glucose-Capillary 84  70 - 99 mg/dL   Comment 1 Notify RN    GLUCOSE, CAPILLARY     Status: None   Collection Time    09/13/12 12:12 AM      Result Value Range   Glucose-Capillary 93  70 - 99 mg/dL  GLUCOSE, CAPILLARY     Status: None   Collection Time     09/13/12  4:17 AM      Result Value Range   Glucose-Capillary 87  70 - 99 mg/dL  PROTIME-INR     Status: Abnormal   Collection Time    09/13/12  8:23 AM      Result Value Range   Prothrombin Time 25.2 (*) 11.6 - 15.2 seconds   INR 2.42 (*) 0.00 - 1.49  CBC     Status: Abnormal   Collection Time    09/13/12  8:23 AM      Result Value Range   WBC 7.9  4.0 - 10.5 K/uL   RBC 3.72 (*) 4.22 - 5.81 MIL/uL   Hemoglobin 11.0 (*) 13.0 - 17.0 g/dL   HCT 95.6 (*) 21.3 - 08.6 %   MCV 87.6  78.0 - 100.0 fL   MCH 29.6  26.0 - 34.0 pg   MCHC 33.7  30.0 - 36.0 g/dL   RDW 57.8 (*) 46.9 - 62.9 %   Platelets 174  150 - 400 K/uL  BASIC METABOLIC PANEL     Status: Abnormal   Collection Time    09/13/12  8:23 AM      Result Value Range   Sodium 138  135 - 145 mEq/L   Potassium 3.5  3.5 - 5.1 mEq/L   Chloride 107  96 - 112 mEq/L   CO2 26  19 - 32 mEq/L   Glucose, Bld 122 (*) 70 - 99 mg/dL   BUN 16  6 - 23 mg/dL  Creatinine, Ser 0.46 (*) 0.50 - 1.35 mg/dL   Calcium 8.5  8.4 - 16.1 mg/dL   GFR calc non Af Amer >90  >90 mL/min   GFR calc Af Amer >90  >90 mL/min   Comment:            The eGFR has been calculated     using the CKD EPI equation.     This calculation has not been     validated in all clinical     situations.     eGFR's persistently     <90 mL/min signify     possible Chronic Kidney Disease.  GLUCOSE, CAPILLARY     Status: Abnormal   Collection Time    09/13/12  8:49 AM      Result Value Range   Glucose-Capillary 101 (*) 70 - 99 mg/dL    Studies/Results: Dg Chest Port 1 View  09/12/2012  *RADIOLOGY REPORT*  Clinical Data: Follow up infiltrate  PORTABLE CHEST - 1 VIEW  Comparison: 09/11/2012  Findings: The cardiac shadow is stable.  A tracheostomy tube and PICC line are also stable.  The lungs are well-aerated bilaterally demonstrate improved aeration in the bases when compared with the previous day.  Some very minimal changes remain in the right lung base.  Continued follow-up  is recommended.  IMPRESSION: Improved aeration in the bases bilaterally.  Continued follow-up of persistent mild right basilar changes is recommended.   Original Report Authenticated By: Alcide Clever, M.D.     Medications:  I have reviewed the patient's current medications. Scheduled: . antiseptic oral rinse  15 mL Mouth Rinse QID  . aspirin  81 mg Per Tube Daily  . chlorhexidine  15 mL Mouth Rinse BID  . feeding supplement  30 mL Per Tube BID  . insulin aspart  0-15 Units Subcutaneous Q4H  . pantoprazole sodium  40 mg Per Tube Q1200  . predniSONE  60 mg Per Tube Q breakfast  . pyridostigmine  90 mg Per Tube 5 X Daily  . sodium chloride  3 mL Intravenous Q12H  . sterile water for irrigation  200 mL Irrigation Q4H  . vancomycin  750 mg Intravenous Q8H  . Warfarin - Pharmacist Dosing Inpatient   Does not apply q1800   Continuous: . feeding supplement (JEVITY 1.2 CAL) 1,000 mL (09/12/12 1707)   WRU:EAVWUJ chloride, acetaminophen, acetaminophen, albuterol, fentaNYL, ondansetron (ZOFRAN) IV, ondansetron, sodium chloride, sodium chloride  Assessment/Plan: Myasthenia gravis exacerbation with continual although gradual improvement with treatment intervention with plasmapheresis. Patient's respiratory status has improved as has strength of the lumbar muscles. He is tolerating plasmapheresis well without complications.  Recommending a change in current management with continuing course of plasmapheresis. We'll also continue to taper prednisone down to maintenance dose prior to admission.  We will continue to follow this patient closely with you.  C.R. Roseanne Reno, MD [facility] [phone]  09/13/2012  9:50 AM

## 2012-09-13 NOTE — Progress Notes (Signed)
Patient ID: Victor Durham, male   DOB: 02-10-1956, 57 y.o.   MRN: [id number] Daily Progress Note Family Medicine Teaching Service Service Pager: [phone]  Subjective:  Patient is tired this morning, not as open with communication. He is suctioning his mouth, but then falling quickly asleep. He has had his back and calf dressing changed. He is to receive his sacral dressing changed this afternoon. He had a large BM this morning.   Objective:  Temp:  [97.9 F (36.6 C)-98.9 F (37.2 C)] 98 F (36.7 C) (04/05 0400) Pulse Rate:  [57-76] 75 (04/05 0400) Resp:  [13-22] 19 (04/05 0400) BP: (94-103)/(55-63) 99/57 mmHg (04/05 0400) SpO2:  [96 %-100 %] 100 % (04/05 0400) FiO2 (%):  [28 %-40 %] 40 % (04/05 0400) Weight:  [155 lb 13.8 oz (70.7 kg)] 155 lb 13.8 oz (70.7 kg) (04/05 0400)  Intake/Output Summary (Last 24 hours) at 09/13/12 0628 Last data filed at 09/13/12 0500  Gross per 24 hour  Intake   2460 ml  Output   2150 ml  Net    310 ml   Physical Exam: General: resting comfortably in bed,  communicating through nods . Tired. Heart: RRR. No murmurs, rubs, or gallops.  Lungs: Mild rhonchi present, mild wheeze or rales. Normal WOB. Abd: PEG in place with feeds. Site clean/dry/intact., S/NT/ND Back: Dressing in place.  Clean, dry, intact.   Extremities: RLE - dressing in place; dry, intact. Neuro: moves all extremities, moves all 4 appendages easily, strength 5/5 in BL UE  Labs and imaging:  CBC  Recent Labs Lab 09/10/12 0500 09/10/12 1318 09/11/12 0500 09/12/12 0500  WBC 11.8*  --  7.9 6.6  HGB 13.5 7.1* 11.7* 11.0*  HCT 39.4 21.0* 33.1* 31.8*  PLT 234  --  194 191   BMET/CMET  Recent Labs Lab 09/09/12 0500  09/10/12 1700 09/11/12 0500 09/12/12 0500  NA 137  < > 138 139 137  K 3.7  < > 3.9 3.0* 3.4*  CL 100  < > 104 106 103  CO2 30  --  26 28 29   BUN 19  < > 17 19 16   CREATININE 0.47*  < > 0.44* 0.50 0.46*  CALCIUM 8.6  --  8.8 8.6 8.4  PROT 8.3  --   --   5.9*  --   BILITOT 1.6*  --   --  0.9  --   ALKPHOS 68  --   --  38*  --   ALT 69*  --   --  40  --   AST 28  --   --  19  --   GLUCOSE 100*  < > 110* 111* 95  < > = values in this interval not displayed.  Scheduled Medications: . antiseptic oral rinse  15 mL Mouth Rinse QID  . aspirin  81 mg Per Tube Daily  . calcium gluconate  2 g Intravenous Once  . chlorhexidine  15 mL Mouth Rinse BID  . feeding supplement  30 mL Per Tube BID  . heparin  1,000 Units Intracatheter Once  . insulin aspart  0-15 Units Subcutaneous Q4H  . pantoprazole sodium  40 mg Per Tube Q1200  . predniSONE  60 mg Per Tube Q breakfast  . pyridostigmine  90 mg Per Tube 5 X Daily  . sodium chloride  3 mL Intravenous Q12H  . sterile water for irrigation  200 mL Irrigation Q4H  . vancomycin  750 mg Intravenous Q8H  . Warfarin -  Pharmacist Dosing Inpatient   Does not apply q1800   Continuous Infusions: . citrate dextrose 500 mL (09/13/12 0129)  . feeding supplement (JEVITY 1.2 CAL) 1,000 mL (09/12/12 1707)   Dg Chest Port 1 View  09/12/2012  *RADIOLOGY REPORT*  Clinical Data: Follow up infiltrate  PORTABLE CHEST - 1 VIEW  Comparison: 09/11/2012  Findings: The cardiac shadow is stable.  A tracheostomy tube and PICC line are also stable.  The lungs are well-aerated bilaterally demonstrate improved aeration in the bases when compared with the previous day.  Some very minimal changes remain in the right lung base.  Continued follow-up is recommended.  IMPRESSION: Improved aeration in the bases bilaterally.  Continued follow-up of persistent mild right basilar changes is recommended.   Original Report Authenticated By: Alcide Clever, M.D.     PRN Medications:sodium chloride, acetaminophen, acetaminophen, acetaminophen, albuterol, calcium carbonate, calcium gluconate IVPB, calcium gluconate IVPB, calcium gluconate, diphenhydrAMINE, fentaNYL, ondansetron (ZOFRAN) IV, ondansetron, sodium chloride, sodium chloride  Assessment  57  yo M with RLE cellulitis and multiple abscesses as well as MRSA bacteremia now with associated myasthenic crisis.   Myasthenic Crisis - appears better today.28%O2 trach collar this morning at 8am - thought related to increased prednisone abruptly (given for stress dose) but possibly due to infection given crisis started <12 hours after admin high dose prednisone and typically 5-10 days after increased dose of prednisone,  - Likely exacerbated by MRSA bacteremia  - Neurology following and we greatly appreciate their help in managing this patient.  - Now s/p 5 day course of IV IG  - on high dose prednisone 60mg  in addition to pyridostigmine via neuro recommendations. - S/P plasma exchange on 4/2 and 4/4, will be given every other day for x5 tx - Pulmonology consulted- appreciate recs and management of vent; attempt to wean with trach trials as tolerated. 28% this morning.  MRSA bacteremia -likely source RLE cellulitis and multiple abscess. MRI tib/fib showed cellulitis, with no infiltration to muscle/bone.  - ID s/o, appreciate reccs -TEE resulted with 65% EF, trivial PR and AI. No endocarditis.  - 4 weeks IV vanc from 3/25 per ID, last day will be April 22nd; weekly vanc trough needed. - Vanc trough on 4/1 14.4, Goal 15-20. Next due 4/8.  H/o DVT - Coumadin per pharmacy.   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 Healing well. - R leg, Sacrum, R Back - Nursing dressing wounds daily.   Hypokalemia - Potassium 3.5 today - replete per PEG  FEN/GI -  KVO, continue Peg feeds Ppx- coumadin per pharmacy. Dispo - pending continued clinical improvement. When stable will be discharged to [facility].   Code: FULL code Contact today: Jakim Drapeau, father: [phone]; Will attempt to make contact with father over the weekend  for updates

## 2012-09-13 NOTE — Progress Notes (Signed)
PULMONARY  / CRITICAL CARE MEDICINE  Name: Victor Durham MRN: [id number] DOB: 03/20/1956    ADMISSION DATE:  08/31/2012 CONSULTATION DATE:  09/03/2012  REFERRING MD :  FPTS  CHIEF COMPLAINT:  Hypercarbic respiratory failure  BRIEF PATIENT DESCRIPTION: 65 yowm with myasthenia gravis and chronic PEG/ tracheostomy admitted 3/23 with RLE cellulitis and G+ bacteremia.  Became obtunded on 3/26 and ABG reveled profound hypercarbia / acidosis.  Transferred to ICU.  SIGNIFICANT EVENTS / STUDIES:  3/23  Admitted with RLE cellulitis 3/24  RLL MRI >>> Focal subcutaneous fat soft tissue ulceration with a 13 mm pocket of pus at the inferior lateral aspect of the ulceration. No deep abscesses or myositis or osteomyelitis.  Circumferential soft tissue edema in the right lower leg which could represent cellulitis 3/26  Developed acute hypercarbic respiratory failure, transferred to ICU, cuffed tracheostomy tube placed, mechanical ventilation instituted 3/25  2D echo >>>  Poor quality for evaluation of endocarditis 3/26  Head CT >>>Neg 3/26 rx  IVIG 3/29 AM  D/c vent  4/1: TEE: no evidence of endocarditis  4/2: Started Plasma Exchange  LINES / TUBES: Chronic tracheostomy Chronic PEG R PICC 3/25 >>> RF HD Cath 4/2 >>>  CULTURES: Abscess 3/23 >>> MRSA Blood 3/23 >>> MRSA x2  BC 3/25 x 1 - neg Blood 3/26 x2 - neg Urine 3/27 > neg  ANTIBIOTICS: Vancomycin 3/23 >>>  Subjective: Admits tiring some with trach collar on. Didn't attempt PM speech. He denied acute needs.  VITAL SIGNS: Temp:  [97.9 F (36.6 C)-98.9 F (37.2 C)] 98 F (36.7 C) (04/05 0851) Pulse Rate:  [57-76] 72 (04/05 0800) Resp:  [13-22] 16 (04/05 0800) BP: (94-103)/(52-63) 102/52 mmHg (04/05 0800) SpO2:  [96 %-100 %] 96 % (04/05 0800) FiO2 (%):  [28 %-40 %] 28 % (04/05 0800) Weight:  [70.7 kg (155 lb 13.8 oz)] 70.7 kg (155 lb 13.8 oz) (04/05 0400)      VENTILATOR SETTINGS: Vent Mode:  [-] PSV;CPAP FiO2 (%):  [28  %-40 %] 28 % PEEP:  [5 cmH20] 5 cmH20 Pressure Support:  [10 cmH20] 10 cmH20  INTAKE / OUTPUT: Intake/Output     04/04 0701 - 04/05 0700 04/05 0701 - 04/06 0700   I.V. (mL/kg) 480 (6.8) 40 (0.6)   Other 1540    NG/GT 200    IV Piggyback 450    Total Intake(mL/kg) 2670 (37.8) 40 (0.6)   Urine (mL/kg/hr) 2150 (1.3)    Total Output 2150     Net +520 +40        Stool Occurrence 3 x 1 x    PHYSICAL EXAMINATION: General: Alert and interactive. NAD- watch for fatigue. Neuro:  Follows command, cough/gag normalized. HEENT:  PERRL, tracheostomy intact. Cardiovascular:  RRR, no m/r/g. Lungs: Occasional rhonchi.  Abdomen:  Soft, nontender, bowel sounds nl, tol peg feeding  Musculoskeletal:  Edematous / erythematous RLE. Skin:  Sacral decub ulcer.  LABS:  Recent Labs Lab 09/11/12 0500 09/12/12 0500 09/13/12 0823  NA 139 137 138  K 3.0* 3.4* 3.5  CL 106 103 107  CO2 28 29 26   BUN 19 16 16   CREATININE 0.50 0.46* 0.46*  GLUCOSE 111* 95 122*    Recent Labs Lab 09/11/12 0500 09/12/12 0500 09/13/12 0823  HGB 11.7* 11.0* 11.0*  HCT 33.1* 31.8* 32.6*  WBC 7.9 6.6 7.9  PLT 194 191 174      Recent Labs Lab 09/12/12 1606 09/12/12 2011 09/13/12 0012 09/13/12 0417 09/13/12 0849  GLUCAP 90  84 93 87 101*   CXR:   4/4: Improved bibasilar aeration. For f/u.  ASSESSMENT / PLAN:   1) MRSA bacteremia/ TEE negative for endocarditis on 4/1.  Followed by ID. P: - 4 weeks of IV vanc (starting from 3/25) per ID  2) MG.  Likely myasthenia crisis exacerbated by acute infection (prob #1) >now better after plasma exchange  - Head CT negative 3/26 -Neuro note 4/56 appreciated. Tapering pred, continuing plasmapheresis. P:   - Prednisone for MG per neuro - Plasma exchange started 4/2 - Pyridostigmine. - Fentanyl PRN.  3) Acute on chronic hypercarbic respiratory failure in setting of myasthenic crisis related to acute infection (prob 1&2).   - Use PSV as standing vent mode  as much as he can tolerate. Titrate PS for WOB - cont trach collar trials. - hold speech rx assess for cuff down and PMV for now   4) Hypotension presume hypovolemia P: - observation. Fluid bolus prn   5) History of DVT.  4/5- HgB stable P:  - Coumadin per pharmacy. - Trend CBC / INR.  6) Wounds:  a ) R post leg abscess: slowly improving. Followed by [facility] b ) sacral stage I: per WOC c ) Right posterior chest wound has evolved into 100% yellow slough with large amt thick tan drainage and strong odor: per WOC  7) Hypokalemia 4/5- K up to 3.5 - Replace K as needed   CD Young, MD ; [facility] service Mobile [phone] After 3:00 PM call [phone]

## 2012-09-14 LAB — GLUCOSE, CAPILLARY
Glucose-Capillary: 106 mg/dL — ABNORMAL HIGH (ref 70–99)
Glucose-Capillary: 100 mg/dL — ABNORMAL HIGH (ref 70–99)
Glucose-Capillary: 94 mg/dL (ref 70–99)

## 2012-09-14 LAB — CBC
Hemoglobin: 10.6 g/dL — ABNORMAL LOW (ref 13.0–17.0)
MCH: 29.6 pg (ref 26.0–34.0)
Platelets: 196 10*3/uL (ref 150–400)
RBC: 3.58 MIL/uL — ABNORMAL LOW (ref 4.22–5.81)
WBC: 7.1 10*3/uL (ref 4.0–10.5)

## 2012-09-14 LAB — BASIC METABOLIC PANEL
CO2: 28 mEq/L (ref 19–32)
Chloride: 107 mEq/L (ref 96–112)
Glucose, Bld: 119 mg/dL — ABNORMAL HIGH (ref 70–99)
Potassium: 3.1 mEq/L — ABNORMAL LOW (ref 3.5–5.1)
Sodium: 139 mEq/L (ref 135–145)

## 2012-09-14 LAB — POCT I-STAT, CHEM 8
BUN: 15 mg/dL (ref 6–23)
Calcium, Ion: 1.21 mmol/L (ref 1.12–1.23)
Creatinine, Ser: 0.7 mg/dL (ref 0.50–1.35)
Glucose, Bld: 98 mg/dL (ref 70–99)
TCO2: 28 mmol/L (ref 0–100)

## 2012-09-14 LAB — PROTIME-INR
INR: 3.2 — ABNORMAL HIGH (ref 0.00–1.49)
Prothrombin Time: 31 seconds — ABNORMAL HIGH (ref 11.6–15.2)

## 2012-09-14 MED ORDER — ACD FORMULA A 0.73-2.45-2.2 GM/100ML VI SOLN
Status: AC
  Administered 2012-09-14: 500 mL
  Filled 2012-09-14: qty 500

## 2012-09-14 MED ORDER — WARFARIN SODIUM 2.5 MG PO TABS
2.5000 mg | ORAL_TABLET | Freq: Once | ORAL | Status: AC
  Administered 2012-09-14: 2.5 mg via ORAL
  Filled 2012-09-14: qty 1

## 2012-09-14 MED ORDER — POTASSIUM CHLORIDE 20 MEQ/15ML (10%) PO LIQD
40.0000 meq | ORAL | Status: AC
  Administered 2012-09-14 (×2): 40 meq
  Filled 2012-09-14 (×2): qty 30

## 2012-09-14 NOTE — Progress Notes (Signed)
eLink Physician-Brief Progress Note Patient Name: Victor Durham DOB: 04/29/1956 MRN: [id number]  Date of Service  09/14/2012   HPI/Events of Note  Hypokalemia   eICU Interventions  Potassium replaced   Intervention Category Intermediate Interventions: Electrolyte abnormality - evaluation and management  Geovanny Sartin 09/14/2012, 5:03 AM

## 2012-09-14 NOTE — Progress Notes (Signed)
PULMONARY  / CRITICAL CARE MEDICINE  Name: Victor Durham MRN: [id number] DOB: 29-Sep-1955    ADMISSION DATE:  08/31/2012 CONSULTATION DATE:  09/03/2012  REFERRING MD :  FPTS  CHIEF COMPLAINT:  Hypercarbic respiratory failure  BRIEF PATIENT DESCRIPTION: 76 yowm with myasthenia gravis and chronic PEG/ tracheostomy admitted 3/23 with RLE cellulitis and G+ bacteremia.  Became obtunded on 3/26 and ABG reveled profound hypercarbia / acidosis.  Transferred to ICU.  SIGNIFICANT EVENTS / STUDIES:  3/23  Admitted with RLE cellulitis 3/24  RLL MRI >>> Focal subcutaneous fat soft tissue ulceration with a 13 mm pocket of pus at the inferior lateral aspect of the ulceration. No deep abscesses or myositis or osteomyelitis.  Circumferential soft tissue edema in the right lower leg which could represent cellulitis 3/26  Developed acute hypercarbic respiratory failure, transferred to ICU, cuffed tracheostomy tube placed, mechanical ventilation instituted 3/25  2D echo >>>  Poor quality for evaluation of endocarditis 3/26  Head CT >>>Neg 3/26 rx  IVIG 3/29 AM  D/c vent  4/1: TEE: no evidence of endocarditis  4/2: Started Plasma Exchange  LINES / TUBES: Chronic tracheostomy Chronic PEG R PICC 3/25 >>> RF HD Cath 4/2 >>>  CULTURES: Abscess 3/23 >>> MRSA Blood 3/23 >>> MRSA x2  BC 3/25 x 1 - neg Blood 3/26 x2 - neg Urine 3/27 > neg  ANTIBIOTICS: Vancomycin 3/23 >>>  Subjective: Woke easily to indicate he was doing "OK". Discussed w/ RT in room. No concerns identified.  VITAL SIGNS: Temp:  [97.9 F (36.6 C)-98.8 F (37.1 C)] 98.3 F (36.8 C) (04/06 0758) Pulse Rate:  [53-93] 88 (04/06 0813) Resp:  [4-22] 21 (04/06 0813) BP: (88-113)/(45-64) 95/55 mmHg (04/06 0758) SpO2:  [98 %-100 %] 98 % (04/06 0813) FiO2 (%):  [28 %-40 %] 28 % (04/06 0813) Weight:  [71 kg (156 lb 8.4 oz)] 71 kg (156 lb 8.4 oz) (04/06 0013)      VENTILATOR SETTINGS: Vent Mode:  [-] CPAP;PSV FiO2 (%):  [28 %-40  %] 28 % PEEP:  [5 cmH20] 5 cmH20 Pressure Support:  [10 cmH20] 10 cmH20  INTAKE / OUTPUT: Intake/Output     04/05 0701 - 04/06 0700 04/06 0701 - 04/07 0700   I.V. (mL/kg) 463 (6.5) 60 (0.8)   Other 1470 210   NG/GT 1000    IV Piggyback 450    Total Intake(mL/kg) 3383 (47.6) 270 (3.8)   Urine (mL/kg/hr) 2260 (1.3) 450 (1.4)   Total Output 2260 450   Net +1123 -180        Stool Occurrence 3 x 1 x    PHYSICAL EXAMINATION: General: Alert and interactive. NAD- watch for fatigue.  Neuro:  Follows command, cough/gag normalized. Woke easily HEENT:  PERRL, tracheostomy intact. Cardiovascular:  RRR, no m/r/g. Lungs: minimal rhonchi.  Abdomen:  Soft, nontender, bowel sounds nl, tol peg feeding  Musculoskeletal:  Edematous / erythematous RLE. Skin:  Sacral decub ulcer.  LABS:  Recent Labs Lab 09/12/12 0500 09/13/12 0823 09/14/12 0202 09/14/12 0545  NA 137 138 140 139  K 3.4* 3.5 3.2* 3.1*  CL 103 107 103 107  CO2 29 26  --  28  BUN 16 16 15 15   CREATININE 0.46* 0.46* 0.70 0.49*  GLUCOSE 95 122* 98 119*    Recent Labs Lab 09/12/12 0500 09/13/12 0823 09/14/12 0202 09/14/12 0545  HGB 11.0* 11.0* 10.9* 10.6*  HCT 31.8* 32.6* 32.0* 31.5*  WBC 6.6 7.9  --  7.1  PLT 191 174  --  196      Recent Labs Lab 09/12/12 2011 09/13/12 0012 09/13/12 0417 09/13/12 0849 09/13/12 1223  GLUCAP 84 93 87 101* 149*   CXR:   4/4: Improved bibasilar aeration. For f/u.  ASSESSMENT / PLAN:   1) MRSA bacteremia/ TEE negative for endocarditis on 4/1.  Followed by ID. P: - 4 weeks of IV vanc (starting from 3/25) per ID  2) MG.  Likely myasthenia crisis exacerbated by acute infection (prob #1) >now better after plasma exchange  - Head CT negative 3/26 -Neuro note 4/6 appreciated. Tapering pred, continuing plasmapheresis. P:   - Prednisone for MG per neuro - Plasma exchange started 4/2-->defer to neuro - Pyridostigmine. - Fentanyl PRN.  3) Acute on chronic hypercarbic  respiratory failure in setting of myasthenic crisis related to acute infection (prob 1&2).   - Use PSV as standing vent mode as much as he can tolerate.  - cont trach collar trials.    4) intermittent Hypotension presume hypovolemia P: - observation. Fluid bolus prn   5) History of DVT.  4/5- HgB stable P:  - Coumadin per pharmacy. - Trend CBC / INR.  6) Wounds:  a ) R post leg abscess: slowly improving. Followed by WOC b ) sacral stage I: per WOC c ) Right posterior chest wound has evolved into 100% yellow slough with large amt thick tan drainage and strong odor: per WOC  7) Hypokalemia - Replace K as needed

## 2012-09-14 NOTE — Progress Notes (Signed)
Patient ID: Victor Durham, male   DOB: 03-01-1956, 57 y.o.   MRN: [id number] Daily Progress Note Family Medicine Teaching Service Service Pager: [phone]  Subjective:  Patient is awake and to communicate. Does not appear as tired.   Objective:  Temp:  [97.9 F (36.6 C)-98.8 F (37.1 C)] 98.1 F (36.7 C) (04/06 0345) Pulse Rate:  [53-93] 53 (04/06 0410) Resp:  [4-22] 7 (04/06 0410) BP: (88-113)/(46-64) 103/52 mmHg (04/06 0410) SpO2:  [96 %-100 %] 100 % (04/06 0410) FiO2 (%):  [28 %-40 %] 40 % (04/06 0410) Weight:  [156 lb 8.4 oz (71 kg)] 156 lb 8.4 oz (71 kg) (04/06 0013)  Intake/Output Summary (Last 24 hours) at 09/14/12 0733 Last data filed at 09/14/12 0412  Gross per 24 hour  Intake   2963 ml  Output   2260 ml  Net    703 ml   Physical Exam: General: resting comfortably in bed,  communicating through nods .  Heart: RRR. No murmurs, rubs, or gallops.  Lungs: Mild rhonchi present.Normal WOB. Appreciated mild rales today. Abd: PEG in place with feeds. Site clean/dry/intact., S/NT/ND Back: Dressing in place.  Clean, dry, intact.   Extremities: RLE - dressing in place; dry, intact. Neuro: moves all extremities, moves all 4 appendages easily, strength 5/5 in BL UE  Labs and imaging:  CBC  Recent Labs Lab 09/12/12 0500 09/13/12 0823 09/14/12 0202 09/14/12 0545  WBC 6.6 7.9  --  7.1  HGB 11.0* 11.0* 10.9* 10.6*  HCT 31.8* 32.6* 32.0* 31.5*  PLT 191 174  --  196   BMET/CMET  Recent Labs Lab 09/09/12 0500  09/11/12 0500 09/12/12 0500 09/13/12 0823 09/14/12 0202 09/14/12 0545  NA 137  < > 139 137 138 140 139  K 3.7  < > 3.0* 3.4* 3.5 3.2* 3.1*  CL 100  < > 106 103 107 103 107  CO2 30  < > 28 29 26   --  28  BUN 19  < > 19 16 16 15 15   CREATININE 0.47*  < > 0.50 0.46* 0.46* 0.70 0.49*  CALCIUM 8.6  < > 8.6 8.4 8.5  --  8.7  PROT 8.3  --  5.9*  --   --   --   --   BILITOT 1.6*  --  0.9  --   --   --   --   ALKPHOS 68  --  38*  --   --   --   --   ALT 69*   --  40  --   --   --   --   AST 28  --  19  --   --   --   --   GLUCOSE 100*  < > 111* 95 122* 98 119*  < > = values in this interval not displayed.  Scheduled Medications: . antiseptic oral rinse  15 mL Mouth Rinse QID  . aspirin  81 mg Per Tube Daily  . calcium gluconate  2 g Intravenous Once  . chlorhexidine  15 mL Mouth Rinse BID  . feeding supplement  30 mL Per Tube BID  . insulin aspart  0-15 Units Subcutaneous Q4H  . pantoprazole sodium  40 mg Per Tube Q1200  . potassium chloride  40 mEq Per Tube Q4H  . predniSONE  50 mg Per Tube Q breakfast  . pyridostigmine  90 mg Per Tube 5 X Daily  . sodium chloride  3 mL Intravenous Q12H  .  sterile water for irrigation  200 mL Irrigation Q4H  . vancomycin  750 mg Intravenous Q8H  . Warfarin - Pharmacist Dosing Inpatient   Does not apply q1800   Continuous Infusions: . citrate dextrose    . feeding supplement (JEVITY 1.2 CAL) 70 mL/hr at 09/13/12 1536   Dg Chest Port 1 View  09/12/2012  *RADIOLOGY REPORT*  Clinical Data: Follow up infiltrate  PORTABLE CHEST - 1 VIEW  Comparison: 09/11/2012  Findings: The cardiac shadow is stable.  A tracheostomy tube and PICC line are also stable.  The lungs are well-aerated bilaterally demonstrate improved aeration in the bases when compared with the previous day.  Some very minimal changes remain in the right lung base.  Continued follow-up is recommended.  IMPRESSION: Improved aeration in the bases bilaterally.  Continued follow-up of persistent mild right basilar changes is recommended.   Original Report Authenticated By: Alcide Clever, M.D.     PRN Medications:sodium chloride, acetaminophen, acetaminophen, acetaminophen, albuterol, calcium carbonate, calcium gluconate IVPB, calcium gluconate IVPB, calcium gluconate, diphenhydrAMINE, fentaNYL, ondansetron (ZOFRAN) IV, ondansetron, sodium chloride, sodium chloride  Assessment  57 yo M with RLE cellulitis and multiple abscesses as well as MRSA bacteremia  now with associated myasthenic crisis.   Myasthenic Crisis - appears better today.40%vent this morning at 8am - thought related to increased prednisone abruptly (given for stress dose) but possibly due to infection given crisis started <12 hours after admin high dose prednisone and typically 5-10 days after increased dose of prednisone,  - Likely exacerbated by MRSA bacteremia  - Neurology following and we greatly appreciate their help in managing this patient.  - Now s/p 5 day course of IV IG  - on high dose prednisone 60mg  in addition to pyridostigmine via neuro recommendations. - S/P plasma exchange on 4/2 and 4/4, will be given every other day for x5 tx - Pulmonology consulted- appreciate recs and management of vent; attempt to wean with trach trials as tolerated. 28% this morning.  MRSA bacteremia -likely source RLE cellulitis and multiple abscess. MRI tib/fib showed cellulitis, with no infiltration to muscle/bone.  - ID s/o, appreciate reccs -TEE resulted with 65% EF, trivial PR and AI. No endocarditis.  - 4 weeks IV vanc from 3/25 per ID, last day will be April 22nd; weekly vanc trough needed. - Vanc trough on 4/1 14.4, Goal 15-20. Next due 4/8.  H/o DVT - Coumadin per pharmacy.   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 - R leg, Sacrum, R Back - Nursing dressing wounds daily.   Hypokalemia - Potassium 3.5--> 3.1 - replete per PEG  FEN/GI -  KVO, continue Peg feeds Ppx- coumadin per pharmacy. Dispo - pending continued clinical improvement. When stable will be discharged to [facility].   Code: FULL code Contact today: Shaiden Aldous, father: [phone]; Would like contacted every couple of days, last call 4/5.

## 2012-09-14 NOTE — Progress Notes (Addendum)
Subjective: No complaints. Status post 3 of 5 planned plasma exchanges. Feels he's getting progressively stronger.  Objective: Current vital signs: BP 95/55  Pulse 88  Temp(Src) 98.3 F (36.8 C) (Oral)  Resp 21  Ht 6' (1.829 m)  Wt 71 kg (156 lb 8.4 oz)  BMI 21.22 kg/m2  SpO2 98%  Neurologic Exam: Alert and in no acute distress. He is connected to mechanical ventilation, but breathing on his own at 14 rest prominent with no extra effort. Extraocular movements were full and conjugate with no diplopia in any field of gaze. Mild weakness of the frontalis muscle bilaterally. Able to partially bury eyelashes with essentially no change from yesterday. Slight ptosis of right eyelid and moderate ptosis left eyelid noted. Mild weakness of masseter and pterygoid muscles noted. Orbicularis oris was moderately weak. Strength of extremities was normal proximally and distally.  Lab Results: Results for orders placed during the hospital encounter of 08/31/12 (from the past 48 hour(s))  GLUCOSE, CAPILLARY     Status: Abnormal   Collection Time    09/12/12 12:15 PM      Result Value Range   Glucose-Capillary 115 (*) 70 - 99 mg/dL   Comment 1 Notify RN    GLUCOSE, CAPILLARY     Status: None   Collection Time    09/12/12  4:06 PM      Result Value Range   Glucose-Capillary 90  70 - 99 mg/dL   Comment 1 Notify RN    GLUCOSE, CAPILLARY     Status: None   Collection Time    09/12/12  8:11 PM      Result Value Range   Glucose-Capillary 84  70 - 99 mg/dL   Comment 1 Notify RN    GLUCOSE, CAPILLARY     Status: None   Collection Time    09/13/12 12:12 AM      Result Value Range   Glucose-Capillary 93  70 - 99 mg/dL  GLUCOSE, CAPILLARY     Status: None   Collection Time    09/13/12  4:17 AM      Result Value Range   Glucose-Capillary 87  70 - 99 mg/dL  PROTIME-INR     Status: Abnormal   Collection Time    09/13/12  8:23 AM      Result Value Range   Prothrombin Time 25.2 (*) 11.6 -  15.2 seconds   INR 2.42 (*) 0.00 - 1.49  CBC     Status: Abnormal   Collection Time    09/13/12  8:23 AM      Result Value Range   WBC 7.9  4.0 - 10.5 K/uL   RBC 3.72 (*) 4.22 - 5.81 MIL/uL   Hemoglobin 11.0 (*) 13.0 - 17.0 g/dL   HCT 16.1 (*) 09.6 - 04.5 %   MCV 87.6  78.0 - 100.0 fL   MCH 29.6  26.0 - 34.0 pg   MCHC 33.7  30.0 - 36.0 g/dL   RDW 40.9 (*) 81.1 - 91.4 %   Platelets 174  150 - 400 K/uL  BASIC METABOLIC PANEL     Status: Abnormal   Collection Time    09/13/12  8:23 AM      Result Value Range   Sodium 138  135 - 145 mEq/L   Potassium 3.5  3.5 - 5.1 mEq/L   Chloride 107  96 - 112 mEq/L   CO2 26  19 - 32 mEq/L   Glucose, Bld 122 (*) 70 -  99 mg/dL   BUN 16  6 - 23 mg/dL   Creatinine, Ser 1.61 (*) 0.50 - 1.35 mg/dL   Calcium 8.5  8.4 - 09.6 mg/dL   GFR calc non Af Amer >90  >90 mL/min   GFR calc Af Amer >90  >90 mL/min   Comment:            The eGFR has been calculated     using the CKD EPI equation.     This calculation has not been     validated in all clinical     situations.     eGFR's persistently     <90 mL/min signify     possible Chronic Kidney Disease.  GLUCOSE, CAPILLARY     Status: Abnormal   Collection Time    09/13/12  8:49 AM      Result Value Range   Glucose-Capillary 101 (*) 70 - 99 mg/dL  GLUCOSE, CAPILLARY     Status: Abnormal   Collection Time    09/13/12 12:23 PM      Result Value Range   Glucose-Capillary 149 (*) 70 - 99 mg/dL  POCT I-STAT, CHEM 8     Status: Abnormal   Collection Time    09/14/12  2:02 AM      Result Value Range   Sodium 140  135 - 145 mEq/L   Potassium 3.2 (*) 3.5 - 5.1 mEq/L   Chloride 103  96 - 112 mEq/L   BUN 15  6 - 23 mg/dL   Creatinine, Ser 0.45  0.50 - 1.35 mg/dL   Glucose, Bld 98  70 - 99 mg/dL   Calcium, Ion 4.09  8.11 - 1.23 mmol/L   TCO2 28  0 - 100 mmol/L   Hemoglobin 10.9 (*) 13.0 - 17.0 g/dL   HCT 91.4 (*) 78.2 - 95.6 %  PROTIME-INR     Status: Abnormal   Collection Time    09/14/12  5:45 AM       Result Value Range   Prothrombin Time 31.0 (*) 11.6 - 15.2 seconds   INR 3.20 (*) 0.00 - 1.49  CBC     Status: Abnormal   Collection Time    09/14/12  5:45 AM      Result Value Range   WBC 7.1  4.0 - 10.5 K/uL   RBC 3.58 (*) 4.22 - 5.81 MIL/uL   Hemoglobin 10.6 (*) 13.0 - 17.0 g/dL   HCT 21.3 (*) 08.6 - 57.8 %   MCV 88.0  78.0 - 100.0 fL   MCH 29.6  26.0 - 34.0 pg   MCHC 33.7  30.0 - 36.0 g/dL   RDW 46.9 (*) 62.9 - 52.8 %   Platelets 196  150 - 400 K/uL  BASIC METABOLIC PANEL     Status: Abnormal   Collection Time    09/14/12  5:45 AM      Result Value Range   Sodium 139  135 - 145 mEq/L   Potassium 3.1 (*) 3.5 - 5.1 mEq/L   Chloride 107  96 - 112 mEq/L   CO2 28  19 - 32 mEq/L   Glucose, Bld 119 (*) 70 - 99 mg/dL   BUN 15  6 - 23 mg/dL   Creatinine, Ser 4.13 (*) 0.50 - 1.35 mg/dL   Calcium 8.7  8.4 - 24.4 mg/dL   GFR calc non Af Amer >90  >90 mL/min   GFR calc Af Amer >90  >90 mL/min   Comment:  The eGFR has been calculated     using the CKD EPI equation.     This calculation has not been     validated in all clinical     situations.     eGFR's persistently     <90 mL/min signify     possible Chronic Kidney Disease.    Studies/Results: Dg Chest Port 1 View  09/12/2012  *RADIOLOGY REPORT*  Clinical Data: Follow up infiltrate  PORTABLE CHEST - 1 VIEW  Comparison: 09/11/2012  Findings: The cardiac shadow is stable.  A tracheostomy tube and PICC line are also stable.  The lungs are well-aerated bilaterally demonstrate improved aeration in the bases when compared with the previous day.  Some very minimal changes remain in the right lung base.  Continued follow-up is recommended.  IMPRESSION: Improved aeration in the bases bilaterally.  Continued follow-up of persistent mild right basilar changes is recommended.   Original Report Authenticated By: Alcide Clever, M.D.     Medications:  I have reviewed the patient's current medications. Scheduled: . antiseptic  oral rinse  15 mL Mouth Rinse QID  . aspirin  81 mg Per Tube Daily  . calcium gluconate  2 g Intravenous Once  . chlorhexidine  15 mL Mouth Rinse BID  . feeding supplement  30 mL Per Tube BID  . insulin aspart  0-15 Units Subcutaneous Q4H  . pantoprazole sodium  40 mg Per Tube Q1200  . predniSONE  50 mg Per Tube Q breakfast  . pyridostigmine  90 mg Per Tube 5 X Daily  . sodium chloride  3 mL Intravenous Q12H  . sterile water for irrigation  200 mL Irrigation Q4H  . vancomycin  750 mg Intravenous Q8H  . warfarin  2.5 mg Oral ONCE-1800  . Warfarin - Pharmacist Dosing Inpatient   Does not apply q1800   Continuous: . citrate dextrose    . feeding supplement (JEVITY 1.2 CAL) 70 mL/hr at 09/14/12 8295   AOZ:HYQMVH chloride, acetaminophen, acetaminophen, acetaminophen, albuterol, calcium carbonate, calcium gluconate IVPB, calcium gluconate IVPB, calcium gluconate, diphenhydrAMINE, fentaNYL, ondansetron (ZOFRAN) IV, ondansetron, sodium chloride, sodium chloride  Assessment/Plan: Myasthenia gravis with slow progressive improvement in bulbar muscle strength.  Course of plasmapheresis is to continue for a total of 5 treatments. I will further reduce prednisone to 50 mg per day starting today.  I will continue to follow this patient closely with you.  C.R. Roseanne Reno, MD [facility] [phone]  09/14/2012  9:40 AM

## 2012-09-14 NOTE — Progress Notes (Signed)
Dialysis nurse Renee in do Plasmaphoresis. VS obtained. Vancomycin held until session completed. Pt resting comfortably at present time.

## 2012-09-14 NOTE — Progress Notes (Signed)
ANTICOAGULATION CONSULT NOTE - Follow Up Consult  Pharmacy Consult for Coumadin  Indication: Recent DVT  No Known Allergies  Patient Measurements: Height: 6' (182.9 cm) Weight: 156 lb 8.4 oz (71 kg) IBW/kg (Calculated) : 77.6  Vital Signs: Temp: 98.1 F (36.7 C) (04/06 0345) Temp src: Oral (04/06 0400) BP: 90/55 mmHg (04/06 0500) Pulse Rate: 53 (04/06 0500)  Labs:  Recent Labs  09/12/12 0500 09/13/12 0823 09/14/12 0202 09/14/12 0545  HGB 11.0* 11.0* 10.9* 10.6*  HCT 31.8* 32.6* 32.0* 31.5*  PLT 191 174  --  196  LABPROT 22.9* 25.2*  --  31.0*  INR 2.13* 2.42*  --  3.20*  CREATININE 0.46* 0.46* 0.70 0.49*    Assessment: 57 yo male on Coumadin for hx recent DVT (1/14). INR on admit was supratherapeutic. PTA Coumadin regimen: 7.5mg  daily except 5mg  MWF. INR bumped to 3.2 today, slightly supratherapeutic.  No bleeding noted, CBC stable.   Goal of Therapy:  INR 2-3 Monitor platelets by anticoagulation protocol: Yes  Plan:  - Coumadin 2.5mg  x 1 today - Daily INR   Doris Cheadle, PharmD Clinical Pharmacist Pager: [phone] Phone: [phone] 09/14/2012 7:57 AM

## 2012-09-15 LAB — CBC
MCH: 30.2 pg (ref 26.0–34.0)
MCHC: 34.4 g/dL (ref 30.0–36.0)
MCV: 87.8 fL (ref 78.0–100.0)
Platelets: 199 10*3/uL (ref 150–400)

## 2012-09-15 LAB — BASIC METABOLIC PANEL
Calcium: 8.8 mg/dL (ref 8.4–10.5)
Creatinine, Ser: 0.47 mg/dL — ABNORMAL LOW (ref 0.50–1.35)
GFR calc non Af Amer: >90 mL/min (ref >90)
Glucose, Bld: 112 mg/dL — ABNORMAL HIGH (ref 70–99)
Sodium: 139 mEq/L (ref 135–145)

## 2012-09-15 LAB — GLUCOSE, CAPILLARY
Glucose-Capillary: 109 mg/dL — ABNORMAL HIGH (ref 70–99)
Glucose-Capillary: 82 mg/dL (ref 70–99)

## 2012-09-15 LAB — PROTIME-INR: Prothrombin Time: 17.5 seconds — ABNORMAL HIGH (ref 11.6–15.2)

## 2012-09-15 MED ORDER — WARFARIN SODIUM 7.5 MG PO TABS
7.5000 mg | ORAL_TABLET | Freq: Once | ORAL | Status: AC
  Administered 2012-09-15: 7.5 mg via ORAL
  Filled 2012-09-15: qty 1

## 2012-09-15 NOTE — Progress Notes (Signed)
Mrs. [facility] nurse case manager for Mr. Blakney needs clarification for SNF.  Pt has not been on ATC at night per order for PS.  Need to know if it is ok to try ATC at night.  If not pt will need a SNF that accepts vents.  The one he is currently residing in does not.  Please advise.

## 2012-09-15 NOTE — Progress Notes (Addendum)
Subjective: No new complaints. Patient is off ventilatory assistance and has remained comfortable.  Objective: Current vital signs: BP 102/52  Pulse 82  Temp(Src) 98.5 F (36.9 C) (Oral)  Resp 10  Ht 6' (1.829 m)  Wt 71 kg (156 lb 8.4 oz)  BMI 21.22 kg/m2  SpO2 100%  Neurologic Exam: Alert and in no acute distress. He is able to phonate well with moderately nasal quality of his speech.  Mental status was normal.   Moderate weakness of frontalis muscle bilaterally. Barely able to bury the eyelashes, but completely closed eyes. Eyelid ptosis was mild on the right and moderate on the left. Lower facial weakness was moderately severe. Neck flexor strength was 4 minus over 5, and extensor strength was 5 over 5. Strength of extremities was normal proximally and distally.  Lab Results: Results for orders placed during the hospital encounter of 08/31/12 (from the past 48 hour(s))  GLUCOSE, CAPILLARY     Status: Abnormal   Collection Time    09/13/12  8:49 AM      Result Value Range   Glucose-Capillary 101 (*) 70 - 99 mg/dL  GLUCOSE, CAPILLARY     Status: Abnormal   Collection Time    09/13/12 12:23 PM      Result Value Range   Glucose-Capillary 149 (*) 70 - 99 mg/dL  GLUCOSE, CAPILLARY     Status: None   Collection Time    09/13/12  3:30 PM      Result Value Range   Glucose-Capillary 96  70 - 99 mg/dL  GLUCOSE, CAPILLARY     Status: None   Collection Time    09/13/12  8:19 PM      Result Value Range   Glucose-Capillary 93  70 - 99 mg/dL  GLUCOSE, CAPILLARY     Status: None   Collection Time    09/13/12 11:30 PM      Result Value Range   Glucose-Capillary 93  70 - 99 mg/dL  POCT I-STAT, CHEM 8     Status: Abnormal   Collection Time    09/14/12  2:02 AM      Result Value Range   Sodium 140  135 - 145 mEq/L   Potassium 3.2 (*) 3.5 - 5.1 mEq/L   Chloride 103  96 - 112 mEq/L   BUN 15  6 - 23 mg/dL   Creatinine, Ser 1.61  0.50 - 1.35 mg/dL   Glucose, Bld 98  70 - 99  mg/dL   Calcium, Ion 0.96  0.45 - 1.23 mmol/L   TCO2 28  0 - 100 mmol/L   Hemoglobin 10.9 (*) 13.0 - 17.0 g/dL   HCT 40.9 (*) 81.1 - 91.4 %  GLUCOSE, CAPILLARY     Status: Abnormal   Collection Time    09/14/12  4:58 AM      Result Value Range   Glucose-Capillary 106 (*) 70 - 99 mg/dL  PROTIME-INR     Status: Abnormal   Collection Time    09/14/12  5:45 AM      Result Value Range   Prothrombin Time 31.0 (*) 11.6 - 15.2 seconds   INR 3.20 (*) 0.00 - 1.49  CBC     Status: Abnormal   Collection Time    09/14/12  5:45 AM      Result Value Range   WBC 7.1  4.0 - 10.5 K/uL   RBC 3.58 (*) 4.22 - 5.81 MIL/uL   Hemoglobin 10.6 (*) 13.0 - 17.0 g/dL  HCT 31.5 (*) 39.0 - 52.0 %   MCV 88.0  78.0 - 100.0 fL   MCH 29.6  26.0 - 34.0 pg   MCHC 33.7  30.0 - 36.0 g/dL   RDW 40.9 (*) 81.1 - 91.4 %   Platelets 196  150 - 400 K/uL  BASIC METABOLIC PANEL     Status: Abnormal   Collection Time    09/14/12  5:45 AM      Result Value Range   Sodium 139  135 - 145 mEq/L   Potassium 3.1 (*) 3.5 - 5.1 mEq/L   Chloride 107  96 - 112 mEq/L   CO2 28  19 - 32 mEq/L   Glucose, Bld 119 (*) 70 - 99 mg/dL   BUN 15  6 - 23 mg/dL   Creatinine, Ser 7.82 (*) 0.50 - 1.35 mg/dL   Calcium 8.7  8.4 - 95.6 mg/dL   GFR calc non Af Amer >90  >90 mL/min   GFR calc Af Amer >90  >90 mL/min   Comment:            The eGFR has been calculated     using the CKD EPI equation.     This calculation has not been     validated in all clinical     situations.     eGFR's persistently     <90 mL/min signify     possible Chronic Kidney Disease.  GLUCOSE, CAPILLARY     Status: Abnormal   Collection Time    09/14/12  7:59 AM      Result Value Range   Glucose-Capillary 100 (*) 70 - 99 mg/dL   Comment 1 Notify RN    GLUCOSE, CAPILLARY     Status: Abnormal   Collection Time    09/14/12 12:29 PM      Result Value Range   Glucose-Capillary 142 (*) 70 - 99 mg/dL   Comment 1 Notify RN    GLUCOSE, CAPILLARY     Status: None    Collection Time    09/14/12  5:54 PM      Result Value Range   Glucose-Capillary 90  70 - 99 mg/dL  GLUCOSE, CAPILLARY     Status: None   Collection Time    09/14/12  8:17 PM      Result Value Range   Glucose-Capillary 94  70 - 99 mg/dL  GLUCOSE, CAPILLARY     Status: None   Collection Time    09/14/12 11:32 PM      Result Value Range   Glucose-Capillary 70  70 - 99 mg/dL  GLUCOSE, CAPILLARY     Status: Abnormal   Collection Time    09/15/12  3:41 AM      Result Value Range   Glucose-Capillary 108 (*) 70 - 99 mg/dL  PROTIME-INR     Status: Abnormal   Collection Time    09/15/12  5:15 AM      Result Value Range   Prothrombin Time 17.5 (*) 11.6 - 15.2 seconds   INR 1.48  0.00 - 1.49  CBC     Status: Abnormal   Collection Time    09/15/12  5:15 AM      Result Value Range   WBC 7.3  4.0 - 10.5 K/uL   RBC 3.68 (*) 4.22 - 5.81 MIL/uL   Hemoglobin 11.1 (*) 13.0 - 17.0 g/dL   HCT 21.3 (*) 08.6 - 57.8 %   MCV 87.8  78.0 - 100.0  fL   MCH 30.2  26.0 - 34.0 pg   MCHC 34.4  30.0 - 36.0 g/dL   RDW 16.1 (*) 09.6 - 04.5 %   Platelets 199  150 - 400 K/uL  BASIC METABOLIC PANEL     Status: Abnormal   Collection Time    09/15/12  5:15 AM      Result Value Range   Sodium 139  135 - 145 mEq/L   Potassium 3.3 (*) 3.5 - 5.1 mEq/L   Chloride 103  96 - 112 mEq/L   CO2 32  19 - 32 mEq/L   Glucose, Bld 112 (*) 70 - 99 mg/dL   BUN 16  6 - 23 mg/dL   Creatinine, Ser 4.09 (*) 0.50 - 1.35 mg/dL   Calcium 8.8  8.4 - 81.1 mg/dL   GFR calc non Af Amer >90  >90 mL/min   GFR calc Af Amer >90  >90 mL/min   Comment:            The eGFR has been calculated     using the CKD EPI equation.     This calculation has not been     validated in all clinical     situations.     eGFR's persistently     <90 mL/min signify     possible Chronic Kidney Disease.    Studies/Results: No results found.  Medications:  Scheduled: . antiseptic oral rinse  15 mL Mouth Rinse QID  . aspirin  81 mg Per Tube  Daily  . calcium gluconate  2 g Intravenous Once  . chlorhexidine  15 mL Mouth Rinse BID  . feeding supplement  30 mL Per Tube BID  . insulin aspart  0-15 Units Subcutaneous Q4H  . pantoprazole sodium  40 mg Per Tube Q1200  . predniSONE  50 mg Per Tube Q breakfast  . pyridostigmine  90 mg Per Tube 5 X Daily  . sodium chloride  3 mL Intravenous Q12H  . sterile water for irrigation  200 mL Irrigation Q4H  . vancomycin  750 mg Intravenous Q8H  . Warfarin - Pharmacist Dosing Inpatient   Does not apply q1800   Continuous: . citrate dextrose    . feeding supplement (JEVITY 1.2 CAL) 70 mL/hr at 09/14/12 9147   WGN:FAOZHY chloride, acetaminophen, acetaminophen, acetaminophen, albuterol, calcium carbonate, calcium gluconate IVPB, calcium gluconate IVPB, calcium gluconate, diphenhydrAMINE, fentaNYL, ondansetron (ZOFRAN) IV, ondansetron, sodium chloride, sodium chloride  Assessment/Plan: Myasthenia gravis with continual improvement in respiratory status, no longer requiring ventilator assistance. Facial weakness is slightly worse today compared to yesterday. Speech is improved, however. Patient has received 3 of 5 planned plasmapheresis treatments and is scheduled for his next treatment tomorrow. Vital capacity and negative inspiratory force measurements are pending.  No changes in current management anticipated. We will continue to follow this patient closely with you.  C.R. Roseanne Reno, MD [facility]  [phone]  09/15/2012  8:30 AM

## 2012-09-15 NOTE — Progress Notes (Signed)
Passy-Muir Speaking Valve - Treatment Patient Details  Name: Victor Durham MRN: [id number] Date of Birth: 1956-04-12  Today's Date: 09/15/2012 Time: 1425-1440 SLP Time Calculation (min): 15 min  Past Medical History:  Past Medical History  Diagnosis Date  . DVT (deep venous thrombosis)   . Hypertension   . High cholesterol   . Myasthenia gravis   . Anxiety   . Dysphagia 06/30/12   Past Surgical History:  Past Surgical History  Procedure Laterality Date  . Esophagogastroduodenoscopy  07/11/2012    Procedure: ESOPHAGOGASTRODUODENOSCOPY (EGD);  Surgeon: Shirley Friar, MD;  Location: [facility];  Service: Endoscopy;  Laterality: N/A;  BEDSIDE  . Tracheostomy N/A   . Tee without cardioversion N/A 09/09/2012    Procedure: TRANSESOPHAGEAL ECHOCARDIOGRAM (TEE);  Surgeon: Dolores Patty, MD;  Location: [facility] ENDOSCOPY;  Service: Cardiovascular;  Laterality: N/A;    Assessment / Plan / Recommendation Clinical Impression  Pt. with significant improvements during PMSV skilled treatment compared to session on 4/4.  Cuff was deflated on SLP arrival and verbalizing intermittently around trach.  He is on nocturnal vent, therefore assume he is unable to move to cuffless trach.  Valve was donned for 10-12 minutes with evidence of C02 retention during 1st trial of valve removal.  All vitals were WNL's and pt. conversing in sentences with moderatly decreased intelligibility due to mildly decreased respiratory support and hypernasality.  Valve removed several additional times without indications of C02 retention.  Pt. stated "I want to be able to eat."  Vocal quality intermittently wet throughout indicative of possible penetration of secretions.  Previous admission pt. did not quite progress enough with valve to recommend swallow assessment.  If he continues to make progress with PMSV, would recommend swallow evaluation.         Plan  Continue with current plan of care    Follow Up  Recommendations  Skilled Nursing facility    Pertinent Vitals/Pain none    SLP Goals Potential to Achieve Goals: Good Progress/Goals/Alternative treatment plan discussed with pt/caregiver and they: Agree SLP Goal #1: Pt will tolerate PMSV during all waking hours with intermittent supervision.  SLP Goal #1 - Progress: Progressing toward goal SLP Goal #2: Pt will demonstrate removal and placement of valve with supervision cues.  SLP Goal #2 - Progress: Progressing toward goal SLP Goal #3: Pt will verbalize intelligibly at conversation level with min cues for breath support.  SLP Goal #3 - Progress: Progressing toward goal   PMSV Trial  PMSV was placed for: 10-12 min Able to redirect subglottic air through upper airway: Yes Able to Attain Phonation: Yes Voice Quality:  (hypernasal, intermittently wet) Able to Expectorate Secretions:  (not observed) Breath Support for Phonation: Mildly decreased Intelligibility: Intelligibility reduced Word: 75-100% accurate Phrase: 50-74% accurate Sentence: 50-74% accurate Respirations During Trial:  (WDL) SpO2 During Trial: 100 % Pulse During Trial:  (WDL)   Tracheostomy Tube       Vent Dependency  FiO2 (%): 28 %    Cuff Deflation Trial       Tolerated Cuff Deflation: Yes (cuff deflated on SLP arrival) Behavior: Alert;Responsive to questions   [name] M.Ed [employer] [phone]  09/15/2012

## 2012-09-15 NOTE — Progress Notes (Signed)
Seen and examined.  Doing well on trach collar.  Was able to place speech valve and have a conversation with him.  Per patient, he is feeling much stronger and optimistic that he will stay off vent tonight.  Also, wants to start PO intake, which apparently he was taking PO (not exclusively PEG fed) prior to admit.  Will get speech therapy to see in AM if patient stays off vent tonight.

## 2012-09-15 NOTE — Progress Notes (Signed)
ANTICOAGULATION CONSULT NOTE - Follow Up Consult  Pharmacy Consult for Coumadin  Indication: Recent DVT  No Known Allergies  Patient Measurements: Height: 6' (182.9 cm) Weight: 156 lb 8.4 oz (71 kg) IBW/kg (Calculated) : 77.6  Vital Signs: Temp: 98.2 F (36.8 C) (04/07 1159) Temp src: Oral (04/07 1159) BP: 109/69 mmHg (04/07 1159) Pulse Rate: 71 (04/07 1159)  Labs:  Recent Labs  09/13/12 0823 09/14/12 0202 09/14/12 0545 09/15/12 0515  HGB 11.0* 10.9* 10.6* 11.1*  HCT 32.6* 32.0* 31.5* 32.3*  PLT 174  --  196 199  LABPROT 25.2*  --  31.0* 17.5*  INR 2.42*  --  3.20* 1.48  CREATININE 0.46* 0.70 0.49* 0.47*    Assessment: 57 yo male on Coumadin for hx recent DVT (1/14). INR on admit was supratherapeutic. PTA Coumadin regimen: 7.5mg  daily except 5mg  MWF. INR down to 1.48 today which is subtherapeutic.  No bleeding noted, CBC stable.   Goal of Therapy:  INR 2-3 Monitor platelets by anticoagulation protocol: Yes  Plan:  - Coumadin 7.5mg  x 1 today - Daily INR - Check vancomycin trough in AM  Celedonio Miyamoto, PharmD, [facility] Clinical Pharmacist Pager [phone]  09/15/2012 2:38 PM

## 2012-09-15 NOTE — Progress Notes (Signed)
PT Cancellation Note  Patient Details Name: Victor Durham MRN: [id number] DOB: [birth year]   Cancelled Treatment:    Reason Eval/Treat Not Completed: Medical issues which prohibited therapy.  Noted pt with R Femoral Temporary Cath for Plasmaphoresis.  Will need MD clearance to allow for mobility and flexion of R hip with Temporary Cath, otherwise will sign off and need new order once pt's Temporary Cath removed.  Please clarify.  Thanks.     Sunny Schlein, [place] [phone] 09/15/2012, 12:19 PM

## 2012-09-15 NOTE — Progress Notes (Signed)
Clinical Social Worker staffed case with MD and RNCM.  CSW to continue to follow and assist as needed.   [name], MSW, [place] [phone]

## 2012-09-15 NOTE — Progress Notes (Signed)
PULMONARY  / CRITICAL CARE MEDICINE  Name: Victor Durham MRN: [id number] DOB: September 19, 1955    ADMISSION DATE:  08/31/2012 CONSULTATION DATE:  09/03/2012  REFERRING MD :  FPTS  CHIEF COMPLAINT:  Hypercarbic respiratory failure  BRIEF PATIENT DESCRIPTION: 60 yowm with myasthenia gravis and chronic PEG/ tracheostomy admitted 3/23 with RLE cellulitis and G+ bacteremia.  Became obtunded on 3/26 and ABG reveled profound hypercarbia / acidosis.  Transferred to ICU.  SIGNIFICANT EVENTS / STUDIES:  3/23  Admitted with RLE cellulitis 3/24  RLL MRI >>> Focal subcutaneous fat soft tissue ulceration with a 13 mm pocket of pus at the inferior lateral aspect of the ulceration. No deep abscesses or myositis or osteomyelitis.  Circumferential soft tissue edema in the right lower leg which could represent cellulitis 3/26  Developed acute hypercarbic respiratory failure, transferred to ICU, cuffed tracheostomy tube placed, mechanical ventilation instituted 3/25  2D echo >>>  Poor quality for evaluation of endocarditis 3/26  Head CT >>>Neg 3/26 rx  IVIG 3/29 AM  D/c vent  4/1: TEE: no evidence of endocarditis  4/2: Started Plasma Exchange  LINES / TUBES: Chronic tracheostomy Chronic PEG R PICC 3/25 >>> RF HD Cath 4/2 >>>  CULTURES: Abscess 3/23 >>> MRSA Blood 3/23 >>> MRSA x2  BC 3/25 x 1 - neg Blood 3/26 x2 - neg Urine 3/27 > neg  ANTIBIOTICS: Vancomycin 3/23 >>  Subjective: RASS 0. No new complaints. Wants to try 24 hrs/d off vent  VITAL SIGNS: Temp:  [96.7 F (35.9 C)-98.5 F (36.9 C)] 98.2 F (36.8 C) (04/07 1159) Pulse Rate:  [55-83] 71 (04/07 1159) Resp:  [9-28] 12 (04/07 1159) BP: (91-118)/(51-69) 109/69 mmHg (04/07 1159) SpO2:  [100 %] 100 % (04/07 1159) FiO2 (%):  [28 %-40 %] 28 % (04/07 1159)      VENTILATOR SETTINGS: Vent Mode:  [-] PSV;CPAP FiO2 (%):  [28 %-40 %] 28 % PEEP:  [5 cmH20] 5 cmH20 Pressure Support:  [10 cmH20] 10 cmH20  INTAKE /  OUTPUT: Intake/Output     04/06 0701 - 04/07 0700 04/07 0701 - 04/08 0700   I.V. (mL/kg) 330 (4.6) 3 (0)   Other 1540    NG/GT 1000    IV Piggyback 450    Total Intake(mL/kg) 3320 (46.8) 3 (0)   Urine (mL/kg/hr) 3350 (2) 1200 (2.3)   Stool  2 (0)   Total Output 3350 1202   Net -30 -1199        Stool Occurrence 4 x     PHYSICAL EXAMINATION: General: NAD Neuro:  RASS 0, MAEs with good strength. Bilateral lid lag noted HEENT: [place]/AT, trach site clean Cardiovascular:  RRR s M Lungs: clear anteriorly Abdomen:  Soft, NT, NABS Ext: warm, no edema   LABS:  Recent Labs Lab 09/13/12 0823 09/14/12 0202 09/14/12 0545 09/15/12 0515  NA 138 140 139 139  K 3.5 3.2* 3.1* 3.3*  CL 107 103 107 103  CO2 26  --  28 32  BUN 16 15 15 16   CREATININE 0.46* 0.70 0.49* 0.47*  GLUCOSE 122* 98 119* 112*    Recent Labs Lab 09/13/12 0823 09/14/12 0202 09/14/12 0545 09/15/12 0515  HGB 11.0* 10.9* 10.6* 11.1*  HCT 32.6* 32.0* 31.5* 32.3*  WBC 7.9  --  7.1 7.3  PLT 174  --  196 199      Recent Labs Lab 09/14/12 2017 09/14/12 2332 09/15/12 0341 09/15/12 0739 09/15/12 1146  GLUCAP 94 70 108* 109* 163*   CXR:  No new CXR.  ASSESSMENT / PLAN:  Acute on chronic hypercarbic respiratory failure in setting of myasthenic crisis related to acute infection (prob 1&2).   - ATC as long as tolerated    MRSA bacteremia/ TEE negative for endocarditis on 4/1.  Followed by ID. P: - 4 weeks of IV vanc (starting from 3/25) per ID  MG.  Likely myasthenia crisis exacerbated by acute infection (prob #1) >now better after plasma exchange  - Head CT negative 3/26 -Neuro note 4/6 appreciated. Tapering pred, continuing plasmapheresis. P:   - Mgmt per Neuro    History of DVT.  4/5- HgB stable P:  - Cont warfarin per pharmacy.   Wounds:  Per WOC recs    [name], MD ; [facility] [phone].  After 5:30 PM or weekends, call [phone]

## 2012-09-15 NOTE — Progress Notes (Signed)
Patient ID: Victor Durham, male   DOB: February 01, 1956, 57 y.o.   MRN: [id number] Daily Progress Note Family Medicine Teaching Service Service Pager: [phone]  Subjective:  Patient reports his breathing is a little better today.  Objective:  Temp:  [96.7 F (35.9 C)-98.5 F (36.9 C)] 98.5 F (36.9 C) (04/07 0755) Pulse Rate:  [55-86] 82 (04/07 0755) Resp:  [9-31] 10 (04/07 0755) BP: (91-118)/(50-65) 102/52 mmHg (04/07 0755) SpO2:  [100 %] 100 % (04/07 0755) FiO2 (%):  [28 %-40 %] 40 % (04/07 0755)  Intake/Output Summary (Last 24 hours) at 09/15/12 0828 Last data filed at 09/15/12 0400  Gross per 24 hour  Intake   3320 ml  Output   3350 ml  Net    -30 ml   Physical Exam: General: resting comfortably in bed,  communicating through nods . Cooperative through exams. Heart: RRR. No murmurs, rubs, or gallops.  Lungs: Mild rhonchi present.Normal WOB. Abd: PEG in place with feeds. Site clean/dry/intact., S/NT/ND Back: Dressing in place.  Clean, dry, intact.   Extremities: RLE - dressing in place; dry, intact. Neuro: moves all extremities, moves all 4 appendages easily  Labs and imaging:  CBC  Recent Labs Lab 09/13/12 0823 09/14/12 0202 09/14/12 0545 09/15/12 0515  WBC 7.9  --  7.1 7.3  HGB 11.0* 10.9* 10.6* 11.1*  HCT 32.6* 32.0* 31.5* 32.3*  PLT 174  --  196 199   BMET/CMET  Recent Labs Lab 09/09/12 0500  09/11/12 0500  09/13/12 0823 09/14/12 0202 09/14/12 0545 09/15/12 0515  NA 137  < > 139  < > 138 140 139 139  K 3.7  < > 3.0*  < > 3.5 3.2* 3.1* 3.3*  CL 100  < > 106  < > 107 103 107 103  CO2 30  < > 28  < > 26  --  28 32  BUN 19  < > 19  < > 16 15 15 16   CREATININE 0.47*  < > 0.50  < > 0.46* 0.70 0.49* 0.47*  CALCIUM 8.6  < > 8.6  < > 8.5  --  8.7 8.8  PROT 8.3  --  5.9*  --   --   --   --   --   BILITOT 1.6*  --  0.9  --   --   --   --   --   ALKPHOS 68  --  38*  --   --   --   --   --   ALT 69*  --  40  --   --   --   --   --   AST 28  --  19  --    --   --   --   --   GLUCOSE 100*  < > 111*  < > 122* 98 119* 112*  < > = values in this interval not displayed.  Scheduled Medications: . antiseptic oral rinse  15 mL Mouth Rinse QID  . aspirin  81 mg Per Tube Daily  . calcium gluconate  2 g Intravenous Once  . chlorhexidine  15 mL Mouth Rinse BID  . feeding supplement  30 mL Per Tube BID  . insulin aspart  0-15 Units Subcutaneous Q4H  . pantoprazole sodium  40 mg Per Tube Q1200  . predniSONE  50 mg Per Tube Q breakfast  . pyridostigmine  90 mg Per Tube 5 X Daily  . sodium chloride  3 mL Intravenous  Q12H  . sterile water for irrigation  200 mL Irrigation Q4H  . vancomycin  750 mg Intravenous Q8H  . Warfarin - Pharmacist Dosing Inpatient   Does not apply q1800   Continuous Infusions: . citrate dextrose    . feeding supplement (JEVITY 1.2 CAL) 70 mL/hr at 09/14/12 0927   No results found.  PRN Medications:sodium chloride, acetaminophen, acetaminophen, acetaminophen, albuterol, calcium carbonate, calcium gluconate IVPB, calcium gluconate IVPB, calcium gluconate, diphenhydrAMINE, fentaNYL, ondansetron (ZOFRAN) IV, ondansetron, sodium chloride, sodium chloride  Assessment  57 yo M with RLE cellulitis and multiple abscesses as well as MRSA bacteremia now with associated myasthenic crisis.   Myasthenic Crisis - appears better today.40%vent this morning at 8am - thought related to increased prednisone abruptly (given for stress dose) but possibly due to infection given crisis started <12 hours after admin high dose prednisone and typically 5-10 days after increased dose of prednisone,  - Likely exacerbated by MRSA bacteremia  - Neurology following and we greatly appreciate their help in managing this patient.  - Now s/p 5 day course of IV IG  - on high dose prednisone 60mg  (Tapering) in addition to pyridostigmine via neuro recommendations. - S/P plasma exchange on 4/2 , 4/4, 4/6 and will be given every other day for x5 tx - Pulmonology  consulted- appreciate recs and management of vent; attempt to wean with trach trials as tolerated. 28% this morning.  MRSA bacteremia -likely source RLE cellulitis and multiple abscess. MRI tib/fib showed cellulitis, with no infiltration to muscle/bone.  - ID s/o, appreciate reccs -TEE resulted with 65% EF, trivial PR and AI. No endocarditis.  - 4 weeks IV vanc from 3/25 per ID, last day will be April 22nd; weekly vanc trough needed. - Vanc trough on 4/1 14.4, Goal 15-20. Next due 4/8.  H/o DVT - Coumadin per pharmacy.   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 attmepting trach trials, defer management to pulmonology, again we appreciate their recs and management - Needs to be on 28% Trach in order for current placement to take him back. Mrs. Mayo nurse case manager for [name] needs clarification for SNF. Pt has not been on ATC at night per order for PS. Need to know if it is ok to try ATC at night. If not pt will need a SNF that accepts vents. Appreciate recommendations.   Wounds - banadges clean/dry/inatc--> wounds are packed and are healing well, without drainage - R leg, Sacrum, R Back - Nursing dressing wounds daily.   Hypokalemia - Potassium 3.5--> 3.1--> 3.3 - replete per PEG  FEN/GI -  KVO, continue Peg feeds Ppx- coumadin per pharmacy. Dispo - pending continued clinical improvement. When stable will be discharged to [facility].   Code: FULL code Contact today: Toben Acuna, father: [phone]; Would like contacted every couple of days, last call 4/6.   Mrs. Mayo nurse case manager for Mr. Dowland needs clarification for SNF. Pt has not been on ATC at night per order for PS. Need to know if it is ok to try ATC at night. If not pt will need a SNF that accepts vents. The one he is currently residing in does not. Please advise

## 2012-09-16 LAB — GLUCOSE, CAPILLARY
Glucose-Capillary: 126 mg/dL — ABNORMAL HIGH (ref 70–99)
Glucose-Capillary: 105 mg/dL — ABNORMAL HIGH (ref 70–99)

## 2012-09-16 LAB — BASIC METABOLIC PANEL
BUN: 17 mg/dL (ref 6–23)
Chloride: 102 mEq/L (ref 96–112)
Creatinine, Ser: 0.44 mg/dL — ABNORMAL LOW (ref 0.50–1.35)
GFR calc Af Amer: >90 mL/min (ref >90)
Glucose, Bld: 105 mg/dL — ABNORMAL HIGH (ref 70–99)

## 2012-09-16 LAB — CBC
HCT: 33.7 % — ABNORMAL LOW (ref 39.0–52.0)
MCHC: 33.2 g/dL (ref 30.0–36.0)
MCV: 89.4 fL (ref 78.0–100.0)
RDW: 16.7 % — ABNORMAL HIGH (ref 11.5–15.5)
WBC: 7 10*3/uL (ref 4.0–10.5)

## 2012-09-16 LAB — PROTIME-INR
INR: 1.33 (ref 0.00–1.49)
Prothrombin Time: 16.2 seconds — ABNORMAL HIGH (ref 11.6–15.2)

## 2012-09-16 MED ORDER — ACD FORMULA A 0.73-2.45-2.2 GM/100ML VI SOLN
500.0000 mL | Status: DC
  Filled 2012-09-16 (×2): qty 500

## 2012-09-16 MED ORDER — SODIUM CHLORIDE 0.9 % IV SOLN
4.0000 g | INTRAVENOUS | Status: DC | PRN
  Administered 2012-09-16: 4 g via INTRAVENOUS
  Filled 2012-09-16 (×3): qty 40

## 2012-09-16 MED ORDER — WARFARIN SODIUM 7.5 MG PO TABS
7.5000 mg | ORAL_TABLET | Freq: Once | ORAL | Status: AC
  Administered 2012-09-16: 7.5 mg via ORAL
  Filled 2012-09-16: qty 1

## 2012-09-16 MED ORDER — PREDNISONE 20 MG PO TABS
40.0000 mg | ORAL_TABLET | Freq: Every day | ORAL | Status: DC
  Administered 2012-09-17 – 2012-09-20 (×4): 40 mg
  Filled 2012-09-16 (×5): qty 2

## 2012-09-16 MED ORDER — CALCIUM GLUCONATE 10 % IV SOLN
2.0000 g | Freq: Once | INTRAVENOUS | Status: DC
  Filled 2012-09-16: qty 20

## 2012-09-16 MED ORDER — HEPARIN SODIUM (PORCINE) 1000 UNIT/ML IJ SOLN
1000.0000 [IU] | Freq: Once | INTRAMUSCULAR | Status: AC
  Administered 2012-09-16: 1000 [IU]
  Filled 2012-09-16: qty 1

## 2012-09-16 MED ORDER — SODIUM CHLORIDE 0.9 % IV SOLN
INTRAVENOUS | Status: AC
  Administered 2012-09-16 (×3): via INTRAVENOUS_CENTRAL
  Filled 2012-09-16 (×4): qty 200

## 2012-09-16 MED ORDER — ACD FORMULA A 0.73-2.45-2.2 GM/100ML VI SOLN
Status: AC
  Administered 2012-09-16: 12:00:00
  Filled 2012-09-16: qty 500

## 2012-09-16 MED ORDER — SODIUM CHLORIDE 0.9 % IV SOLN
2.0000 g | INTRAVENOUS | Status: DC | PRN
  Filled 2012-09-16: qty 20

## 2012-09-16 MED ORDER — CALCIUM GLUCONATE 10 % IV SOLN
2.0000 g | INTRAVENOUS | Status: DC | PRN
  Filled 2012-09-16: qty 20

## 2012-09-16 MED ORDER — DIPHENHYDRAMINE HCL 25 MG PO CAPS: 25.0000 mg | ORAL_CAPSULE | Freq: Four times a day (QID) | ORAL | Status: DC | PRN

## 2012-09-16 MED ORDER — SODIUM CHLORIDE 0.9 % IV SOLN: Freq: Once | INTRAVENOUS | Status: DC

## 2012-09-16 MED ORDER — ACETAMINOPHEN 325 MG PO TABS: 650.0000 mg | ORAL_TABLET | ORAL | Status: DC | PRN

## 2012-09-16 NOTE — Progress Notes (Signed)
ANTICOAGULATION CONSULT NOTE - Follow Up Consult  Pharmacy Consult for Coumadin / Vancomycin Indication: Recent DVT / MRSA bacteremia  No Known Allergies  Patient Measurements: Height: 6' (182.9 cm) Weight: 156 lb 8.4 oz (71 kg) IBW/kg (Calculated) : 77.6  Vital Signs: Temp: 97.4 F (36.3 C) (04/08 0757) Temp src: Oral (04/08 0757) BP: 91/51 mmHg (04/08 0757) Pulse Rate: 65 (04/08 0757)  Labs:  Recent Labs  09/14/12 0545 09/15/12 0515 09/16/12 0530  HGB 10.6* 11.1* 11.2*  HCT 31.5* 32.3* 33.7*  PLT 196 199 216  LABPROT 31.0* 17.5* 16.2*  INR 3.20* 1.48 1.33  CREATININE 0.49* 0.47* 0.44*    Assessment: 57 yo male on Coumadin for hx recent DVT (1/14). INR on admit was supratherapeutic. PTA Coumadin regimen: 7.5mg  daily except 5mg  MWF. INR down to 1.33 today which is subtherapeutic.  No bleeding noted, CBC stable.   Continues on vancomycin therapy.  Vancomycin trough therapeutic at 13.3 mcg /dl   Goal of Therapy:  INR 2-3 Monitor platelets by anticoagulation protocol: Yes  Plan:  - Repeat Coumadin 7.5mg  x 1 today - Does he need bridge Lovenox therapy while INR is sub-therapeutic? - Daily INR - Continue vancomycin 750 mg iv Q 8 hours  Thank you. Okey Regal, PharmD [phone]  09/16/2012 10:20 AM

## 2012-09-16 NOTE — Progress Notes (Signed)
Patient ID: Victor Durham, male   DOB: 02/06/1956, 57 y.o.   MRN: [id number] Daily Progress Note Family Medicine Teaching Service Service Pager: [phone]  Subjective:  Patient reports his breathing is good today. He wants to eat and get up and walk. He voiced concerns of returning to [facility] because he believes that is where and why he got his infections. He would like to be transferred to inpatient rehab or be sent home with home health. He lives on his own, but states he has help getting around to where he wants to go. He also voices concern on wether he can return to the gym. He reports he works out about 4x a week and has a Research scientist (physical sciences).  PT was unable to work with him with his plasmapheresis cath in his leg.  Objective:  Temp:  [97.1 F (36.2 C)-98.2 F (36.8 C)] 97.4 F (36.3 C) (04/08 0757) Pulse Rate:  [57-82] 65 (04/08 0757) Resp:  [12-22] 13 (04/08 0757) BP: (90-109)/(47-69) 91/51 mmHg (04/08 0757) SpO2:  [98 %-100 %] 99 % (04/08 0757) FiO2 (%):  [28 %] 28 % (04/08 0757)  Intake/Output Summary (Last 24 hours) at 09/16/12 0817 Last data filed at 09/16/12 0400  Gross per 24 hour  Intake 647.66 ml  Output   2828 ml  Net -2180.34 ml   Physical Exam: General: resting comfortably in bed,  Talking today. Cooperative through exams. Eyes: Left lid ptosis >R Heart: RRR. No murmurs, rubs, or gallops.  Lungs: Mild rhonchi present.Normal WOB, mild crackle right lung base.  Abd: PEG in place with feeds. Site clean/dry/intact., S/NT/ND Back: Dressing in place.  Clean, dry, intact.   Extremities: RLE - dressing in place; dry, intact. Looks like some dry drainage on bandage.  Neuro: moves all extremities, moves all 4 appendages easily  Labs and imaging:  CBC  Recent Labs Lab 09/14/12 0545 09/15/12 0515 09/16/12 0530  WBC 7.1 7.3 7.0  HGB 10.6* 11.1* 11.2*  HCT 31.5* 32.3* 33.7*  PLT 196 199 216   BMET/CMET  Recent Labs Lab 09/11/12 0500  09/14/12 0545  09/15/12 0515 09/16/12 0530  NA 139  < > 139 139 139  K 3.0*  < > 3.1* 3.3* 3.5  CL 106  < > 107 103 102  CO2 28  < > 28 32 33*  BUN 19  < > 15 16 17   CREATININE 0.50  < > 0.49* 0.47* 0.44*  CALCIUM 8.6  < > 8.7 8.8 8.7  PROT 5.9*  --   --   --   --   BILITOT 0.9  --   --   --   --   ALKPHOS 38*  --   --   --   --   ALT 40  --   --   --   --   AST 19  --   --   --   --   GLUCOSE 111*  < > 119* 112* 105*  < > = values in this interval not displayed.  Scheduled Medications: . antiseptic oral rinse  15 mL Mouth Rinse QID  . aspirin  81 mg Per Tube Daily  . chlorhexidine  15 mL Mouth Rinse BID  . feeding supplement  30 mL Per Tube BID  . pantoprazole sodium  40 mg Per Tube Q1200  . predniSONE  50 mg Per Tube Q breakfast  . pyridostigmine  90 mg Per Tube 5 X Daily  . sodium chloride  3 mL  Intravenous Q12H  . sterile water for irrigation  200 mL Irrigation Q4H  . vancomycin  750 mg Intravenous Q8H  . Warfarin - Pharmacist Dosing Inpatient   Does not apply q1800   Continuous Infusions: . feeding supplement (JEVITY 1.2 CAL) 1,000 mL (09/15/12 1650)   No results found.  PRN Medications:sodium chloride, acetaminophen, acetaminophen, albuterol, calcium carbonate, fentaNYL, ondansetron (ZOFRAN) IV, ondansetron, sodium chloride, sodium chloride  Assessment  57 yo M with RLE cellulitis and multiple abscesses as well as MRSA bacteremia now with associated myasthenic crisis. Greatly improved this morning, was able to voice his opinion of wanting different arrangements to be considered for discharge. Was able to maintain 28% trach collar through yesterday afternoon and night.  Myasthenic Crisis - appears better today.40%vent this morning at 8am - thought related to increased prednisone abruptly (given for stress dose) but possibly due to infection given crisis started <12 hours after admin high dose prednisone and typically 5-10 days after increased dose of prednisone,  - Likely exacerbated  by MRSA bacteremia  - Neurology following and we greatly appreciate their help in managing this patient.  - Now s/p 5 day course of IV IG  - on high dose prednisone 50mg  (Tapering) in addition to pyridostigmine via neuro recommendations. - S/P plasma exchange on 4/2 , 4/4, 4/6 and today  will be given every other day for x5 tx - Pulmonology consulted- appreciate recs and management of vent; attempt to wean with trach trials as tolerated. 28% this morning. - Speech evaluation ordered today, patient voiced wanting to return to eating.   MRSA bacteremia -likely source RLE cellulitis and multiple abscess. MRI tib/fib showed cellulitis, with no infiltration to muscle/bone.  - ID s/o, appreciate reccs -TEE resulted with 65% EF, trivial PR and AI. No endocarditis.  - 4 weeks IV vanc from 3/25 per ID, last day will be April 22nd; weekly vanc trough needed. - Vanc trough on 4/1 14.4, Goal 15-20. Next due 4/8.  H/o DVT - Coumadin per pharmacy.   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 attmepting trach trials, defer management to pulmonology, again we appreciate their recs and management - Needs to be on 28% Trach in order for current placement to take him back.   Wounds - banadges clean/dry/inatc--> wounds are packed and are healing well, without drainage - R leg, Sacrum, R Back - Nursing dressing wounds daily.   Hypokalemia - Potassium 3.5--> 3.1--> 3.3--> 3.5 - replete per PEG, as needed  FEN/GI -  KVO, continue Peg feeds Ppx- coumadin per pharmacy. Dispo - pending continued clinical improvement. Patient does not desire to return to [facility], he would like to be in inpatient rehab or go home. Will re-contact social work to re-evaluate dispo situation.  Code: FULL code Contact today: Kenon Delashmit, father: [phone]; Would like contacted every couple of days, last call 4/6. Will contact today with updates.

## 2012-09-16 NOTE — Consult Note (Addendum)
WOC follow-up consult Note Right posterior leg wound continues to slowly improve.  90% red, 10% yellow.  No further tunneling, small tan drainage, no odor.  1.5X.5X.3cm.  Continue present plan of care with Iodoform packing to promote healing. Right posterior chest full thickness wound with decreasing amt of slough. 2X1X.5cm, 80% red, 20% yellow, no odor, mod green drainage.  Discontinue moist gauze and begin Iodoform packing to absorb drainage and provide antimicrobial benefits. Sacrum without any wounds, right buttock with partial thickness lesion .5x.5X.2cm, 100% pink and moist.  Continue barrier cream to protect skin and repel incontinence episodes.  Cammie Mcgee MSN, RN, CWOCN, [place], CNS [phone]

## 2012-09-16 NOTE — Progress Notes (Addendum)
Passy-Muir Speaking Valve - Treatment Patient Details  Name: Victor Durham MRN: [id number] Date of Birth: March 21, 1956  Today's Date: 09/16/2012 Time: 4098-1191 SLP Time Calculation (min): 15 min  Past Medical History:  Past Medical History  Diagnosis Date  . DVT (deep venous thrombosis)   . Hypertension   . High cholesterol   . Myasthenia gravis   . Anxiety   . Dysphagia 06/30/12   Past Surgical History:  Past Surgical History  Procedure Laterality Date  . Esophagogastroduodenoscopy  07/11/2012    Procedure: ESOPHAGOGASTRODUODENOSCOPY (EGD);  Surgeon: Shirley Friar, MD;  Location: [facility];  Service: Endoscopy;  Laterality: N/A;  BEDSIDE  . Tracheostomy N/A   . Tee without cardioversion N/A 09/09/2012    Procedure: TRANSESOPHAGEAL ECHOCARDIOGRAM (TEE);  Surgeon: Dolores Patty, MD;  Location: [facility];  Service: Cardiovascular;  Laterality: N/A;    Assessment / Plan / Recommendation Clinical Impression  Pt. seen for skilled ST with PMSV.  Cuff deflated upon SLP arrival.  Initial placement of valve after 25 seconds resulted in pt. sensation of decreased respirations and valve removed with slight discharge of air (C02?).  After the 2nd placement he was able to comfortably wear valve without indications of difficulty for approximately 15 min.  Intelligibility mildly decreased due to hypernasal vocal quality and lower intensity today.  All vital signs stable throughout session.  Recommend MBS to assess swallow function.  Read MD progress note and spoke to RN regarding MD wanting swallow assessment, however it has not been ordered yet.  Please order MBS and ST can perform tomorrow.      Plan  Continue with current plan of care    Follow Up Recommendations  Skilled Nursing facility    Pertinent Vitals/Pain none    SLP Goals Potential to Achieve Goals: Good Progress/Goals/Alternative treatment plan discussed with pt/caregiver and they: Agree SLP Goal #1: Pt will  tolerate PMSV during all waking hours with intermittent supervision.  SLP Goal #1 - Progress: Progressing toward goal SLP Goal #3: Pt will verbalize intelligibly at conversation level with min cues for breath support.  SLP Goal #3 - Progress: Progressing toward goal   PMSV Trial  PMSV was placed for: 15 min Able to redirect subglottic air through upper airway: Yes Able to Attain Phonation: Yes Voice Quality: Low vocal intensity (hypernasal) Able to Expectorate Secretions:  (not observed) Breath Support for Phonation: Mildly decreased Intelligibility: Intelligibility reduced Word: 75-100% accurate Phrase: 50-74% accurate Sentence: 50-74% accurate Respirations During Trial:  (WDL) SpO2 During Trial: 98 % Pulse During Trial:  (WDL)   Tracheostomy Tube       Vent Dependency  FiO2 (%): 28 %    Cuff Deflation Trial  GO     Tolerated Cuff Deflation:  (n/a) Behavior: Alert;Responsive to questions   [name] M.Ed [employer] [phone]  09/16/2012

## 2012-09-16 NOTE — Progress Notes (Signed)
Subjective: No new complaints. No adverse overnight events reported. Speech is stronger. Patient is due for his fourth 05 courses of plasmapheresis today.  Objective: Current vital signs: BP 91/51  Pulse 65  Temp(Src) 97.4 F (36.3 C) (Oral)  Resp 13  Ht 6' (1.829 m)  Wt 71 kg (156 lb 8.4 oz)  BMI 21.22 kg/m2  SpO2 99%  Neurologic Exam: Alert and in no acute distress. Mental status was normal. Extraocular movements were full and conjugate with no diplopia. Mild ptosis of left eyelid noted; no ptosis of the right. Frontalis strength was 4+ over 5 bilaterally. Patient was able to minimally bury his eyelashes on eye closure with maximum effort. Minimal lower facial weakness. Neck flexor strength was 4+ over 5; extensors strength was 5 over 5. Strength of extremities was normal proximally and distally.  Lab Results: Results for orders placed during the hospital encounter of 08/31/12 (from the past 48 hour(s))  GLUCOSE, CAPILLARY     Status: Abnormal   Collection Time    09/14/12 12:29 PM      Result Value Range   Glucose-Capillary 142 (*) 70 - 99 mg/dL   Comment 1 Notify RN    GLUCOSE, CAPILLARY     Status: None   Collection Time    09/14/12  5:54 PM      Result Value Range   Glucose-Capillary 90  70 - 99 mg/dL  GLUCOSE, CAPILLARY     Status: None   Collection Time    09/14/12  8:17 PM      Result Value Range   Glucose-Capillary 94  70 - 99 mg/dL  GLUCOSE, CAPILLARY     Status: None   Collection Time    09/14/12 11:32 PM      Result Value Range   Glucose-Capillary 70  70 - 99 mg/dL  GLUCOSE, CAPILLARY     Status: Abnormal   Collection Time    09/15/12  3:41 AM      Result Value Range   Glucose-Capillary 108 (*) 70 - 99 mg/dL  PROTIME-INR     Status: Abnormal   Collection Time    09/15/12  5:15 AM      Result Value Range   Prothrombin Time 17.5 (*) 11.6 - 15.2 seconds   INR 1.48  0.00 - 1.49  CBC     Status: Abnormal   Collection Time    09/15/12  5:15 AM   Result Value Range   WBC 7.3  4.0 - 10.5 K/uL   RBC 3.68 (*) 4.22 - 5.81 MIL/uL   Hemoglobin 11.1 (*) 13.0 - 17.0 g/dL   HCT 16.1 (*) 09.6 - 04.5 %   MCV 87.8  78.0 - 100.0 fL   MCH 30.2  26.0 - 34.0 pg   MCHC 34.4  30.0 - 36.0 g/dL   RDW 40.9 (*) 81.1 - 91.4 %   Platelets 199  150 - 400 K/uL  BASIC METABOLIC PANEL     Status: Abnormal   Collection Time    09/15/12  5:15 AM      Result Value Range   Sodium 139  135 - 145 mEq/L   Potassium 3.3 (*) 3.5 - 5.1 mEq/L   Chloride 103  96 - 112 mEq/L   CO2 32  19 - 32 mEq/L   Glucose, Bld 112 (*) 70 - 99 mg/dL   BUN 16  6 - 23 mg/dL   Creatinine, Ser 7.82 (*) 0.50 - 1.35 mg/dL   Calcium 8.8  8.4 - 10.5 mg/dL   GFR calc non Af Amer >90  >90 mL/min   GFR calc Af Amer >90  >90 mL/min   Comment:            The eGFR has been calculated     using the CKD EPI equation.     This calculation has not been     validated in all clinical     situations.     eGFR's persistently     <90 mL/min signify     possible Chronic Kidney Disease.  GLUCOSE, CAPILLARY     Status: Abnormal   Collection Time    09/15/12  7:39 AM      Result Value Range   Glucose-Capillary 109 (*) 70 - 99 mg/dL   Comment 1 Notify RN    GLUCOSE, CAPILLARY     Status: Abnormal   Collection Time    09/15/12 11:46 AM      Result Value Range   Glucose-Capillary 163 (*) 70 - 99 mg/dL   Comment 1 Notify RN    GLUCOSE, CAPILLARY     Status: Abnormal   Collection Time    09/15/12  4:22 PM      Result Value Range   Glucose-Capillary 111 (*) 70 - 99 mg/dL   Comment 1 Notify RN    GLUCOSE, CAPILLARY     Status: None   Collection Time    09/15/12 11:43 PM      Result Value Range   Glucose-Capillary 82  70 - 99 mg/dL  PROTIME-INR     Status: Abnormal   Collection Time    09/16/12  5:30 AM      Result Value Range   Prothrombin Time 16.2 (*) 11.6 - 15.2 seconds   INR 1.33  0.00 - 1.49  CBC     Status: Abnormal   Collection Time    09/16/12  5:30 AM      Result Value  Range   WBC 7.0  4.0 - 10.5 K/uL   RBC 3.77 (*) 4.22 - 5.81 MIL/uL   Hemoglobin 11.2 (*) 13.0 - 17.0 g/dL   HCT 21.3 (*) 08.6 - 57.8 %   MCV 89.4  78.0 - 100.0 fL   MCH 29.7  26.0 - 34.0 pg   MCHC 33.2  30.0 - 36.0 g/dL   RDW 46.9 (*) 62.9 - 52.8 %   Platelets 216  150 - 400 K/uL  BASIC METABOLIC PANEL     Status: Abnormal   Collection Time    09/16/12  5:30 AM      Result Value Range   Sodium 139  135 - 145 mEq/L   Potassium 3.5  3.5 - 5.1 mEq/L   Chloride 102  96 - 112 mEq/L   CO2 33 (*) 19 - 32 mEq/L   Glucose, Bld 105 (*) 70 - 99 mg/dL   BUN 17  6 - 23 mg/dL   Creatinine, Ser 4.13 (*) 0.50 - 1.35 mg/dL   Calcium 8.7  8.4 - 24.4 mg/dL   GFR calc non Af Amer >90  >90 mL/min   GFR calc Af Amer >90  >90 mL/min   Comment:            The eGFR has been calculated     using the CKD EPI equation.     This calculation has not been     validated in all clinical     situations.     eGFR's persistently     <  90 mL/min signify     possible Chronic Kidney Disease.  GLUCOSE, CAPILLARY     Status: Abnormal   Collection Time    09/16/12  7:56 AM      Result Value Range   Glucose-Capillary 126 (*) 70 - 99 mg/dL   Comment 1 Notify RN    VANCOMYCIN, TROUGH     Status: None   Collection Time    09/16/12  8:38 AM      Result Value Range   Vancomycin Tr 13.6  10.0 - 20.0 ug/mL    Studies/Results: No results found.  Medications:  I have reviewed the patient's current medications. Scheduled: . antiseptic oral rinse  15 mL Mouth Rinse QID  . aspirin  81 mg Per Tube Daily  . chlorhexidine  15 mL Mouth Rinse BID  . feeding supplement  30 mL Per Tube BID  . pantoprazole sodium  40 mg Per Tube Q1200  . [START ON 09/17/2012] predniSONE  40 mg Per Tube Q breakfast  . pyridostigmine  90 mg Per Tube 5 X Daily  . sodium chloride  3 mL Intravenous Q12H  . sterile water for irrigation  200 mL Irrigation Q4H  . vancomycin  750 mg Intravenous Q8H  . Warfarin - Pharmacist Dosing Inpatient    Does not apply q1800   Continuous: . feeding supplement (JEVITY 1.2 CAL) 1,000 mL (09/15/12 1650)   ZOX:WRUEAV chloride, acetaminophen, acetaminophen, albuterol, calcium carbonate, fentaNYL, ondansetron (ZOFRAN) IV, ondansetron, sodium chloride, sodium chloride  Assessment/Plan: Myasthenia gravis crisis with continual improvement with plasmapheresis in reducing prednisone dose.  We'll continue with plasmapheresis as planned, as well as further reduce prednisone to 40 mg per day. No change in Mestinon dose.  C.R. Roseanne Reno, MD [facility]  [phone]  09/16/2012  10:04 AM

## 2012-09-16 NOTE — Progress Notes (Signed)
PULMONARY  / CRITICAL CARE MEDICINE  Name: Victor Durham MRN: [id number] DOB: [birth year]    ADMISSION DATE:  08/31/2012 CONSULTATION DATE:  09/03/2012  REFERRING MD :  FPTS  CHIEF COMPLAINT:  Hypercarbic respiratory failure  BRIEF PATIENT DESCRIPTION: 39 yowm with myasthenia gravis and chronic PEG/ tracheostomy admitted 3/23 with RLE cellulitis and G+ bacteremia.  Became obtunded on 3/26 and ABG reveled profound hypercarbia / acidosis.  Transferred to ICU.  SIGNIFICANT EVENTS / STUDIES:  3/23  Admitted with RLE cellulitis 3/24  RLL MRI >>> Focal subcutaneous fat soft tissue ulceration with a 13 mm pocket of pus at the inferior lateral aspect of the ulceration. No deep abscesses or myositis or osteomyelitis.  Circumferential soft tissue edema in the right lower leg which could represent cellulitis 3/26  Developed acute hypercarbic respiratory failure, transferred to ICU, cuffed tracheostomy tube placed, mechanical ventilation instituted 3/25  2D echo >>>  Poor quality for evaluation of endocarditis 3/26  Head CT >>>Neg 3/26 rx  IVIG 3/29 AM  D/c vent  4/1: TEE: no evidence of endocarditis  4/2: Started Plasma Exchange 4/8: Off vent 24 hrs/day   LINES / TUBES: Chronic tracheostomy Chronic PEG R PICC 3/25 >>> RF HD Cath 4/2 >>>  CULTURES: Abscess 3/23 >>> MRSA Blood 3/23 >>> MRSA x2  BC 3/25 x 1 - neg Blood 3/26 x2 - neg Urine 3/27 > neg  ANTIBIOTICS: Vancomycin 3/23 >>  Subjective: RASS 0. No new complaints. tolerated ATC all day and all night. No distress. No new complaitnts  VITAL SIGNS: Temp:  [97 F (36.1 C)-97.8 F (36.6 C)] 97.3 F (36.3 C) (04/08 1636) Pulse Rate:  [57-82] 63 (04/08 1643) Resp:  [12-22] 20 (04/08 1643) BP: (85-105)/(47-66) 86/47 mmHg (04/08 1643) SpO2:  [98 %-100 %] 100 % (04/08 1643) FiO2 (%):  [28 %] 28 % (04/08 1643) Weight:  [156 lb 15.5 oz (71.2 kg)] 156 lb 15.5 oz (71.2 kg) (04/08 1425)      VENTILATOR SETTINGS: Vent Mode:   [-]  FiO2 (%):  [28 %] 28 %  INTAKE / OUTPUT: Intake/Output     04/07 0701 - 04/08 0700 04/08 0701 - 04/09 0700   I.V. (mL/kg) 367.7 (5.2)    Other     NG/GT     IV Piggyback 300    Total Intake(mL/kg) 667.7 (9.4)    Urine (mL/kg/hr) 2825 (1.7) 1050 (1.5)   Stool 3 (0) 1 (0)   Total Output 2828 1051   Net -2160.3 -1051         PHYSICAL EXAMINATION: General: NAD Neuro:  RASS 0, MAEs with good strength. Bilateral lid lag noted HEENT: [place]/AT, trach site clean Cardiovascular:  RRR s M Lungs: clear anteriorly Abdomen:  Soft, NT, NABS Ext: warm, no edema   LABS:  Recent Labs Lab 09/14/12 0545 09/15/12 0515 09/16/12 0530  NA 139 139 139  K 3.1* 3.3* 3.5  CL 107 103 102  CO2 28 32 33*  BUN 15 16 17   CREATININE 0.49* 0.47* 0.44*  GLUCOSE 119* 112* 105*    Recent Labs Lab 09/14/12 0545 09/15/12 0515 09/16/12 0530  HGB 10.6* 11.1* 11.2*  HCT 31.5* 32.3* 33.7*  WBC 7.1 7.3 7.0  PLT 196 199 216      Recent Labs Lab 09/15/12 0739 09/15/12 1146 09/15/12 1622 09/15/12 2343 09/16/12 0756  GLUCAP 109* 163* 111* 82 126*   CXR:   No new CXR.  ASSESSMENT / PLAN:  Acute on chronic hypercarbic respiratory failure  in setting of myasthenic crisis related to acute infection (prob 1&2).   - Cont ATC as long as tolerated    MRSA bacteremia/ TEE negative for endocarditis on 4/1.  P: - 4 weeks of IV vanc (starting from 3/25) per ID  MG.  Likely myasthenia crisis exacerbated by acute infection (prob #1) >now better after plasma exchange  - Head CT negative 3/26 -Neuro note 4/6 appreciated. Tapering pred, continuing plasmapheresis. P:   - Mgmt per Neuro    History of DVT.  P:  - Cont warfarin per pharmacy.   Wounds:  Per WOC recs    [name], MD ; [facility] [phone].  After 5:30 PM or weekends, call [phone]

## 2012-09-16 NOTE — Progress Notes (Signed)
Plasma exchange completed without issue. Pt tolerated very well. Total replacement 3200. No s/sx of reaction. Next exchange will be Thursday. Report given to [facility] on 2600

## 2012-09-17 ENCOUNTER — Inpatient Hospital Stay (HOSPITAL_COMMUNITY): Payer: Medicaid Other

## 2012-09-17 LAB — BASIC METABOLIC PANEL
Calcium: 8.6 mg/dL (ref 8.4–10.5)
GFR calc Af Amer: >90 mL/min (ref >90)
GFR calc non Af Amer: >90 mL/min (ref >90)
Potassium: 3.6 mEq/L (ref 3.5–5.1)
Sodium: 142 mEq/L (ref 135–145)

## 2012-09-17 LAB — CBC
Hemoglobin: 11.2 g/dL — ABNORMAL LOW (ref 13.0–17.0)
MCHC: 33.9 g/dL (ref 30.0–36.0)
Platelets: 192 10*3/uL (ref 150–400)
RDW: 17 % — ABNORMAL HIGH (ref 11.5–15.5)

## 2012-09-17 LAB — PROTIME-INR
INR: 2.1 — ABNORMAL HIGH (ref 0.00–1.49)
Prothrombin Time: 22.7 seconds — ABNORMAL HIGH (ref 11.6–15.2)

## 2012-09-17 LAB — GLUCOSE, CAPILLARY
Glucose-Capillary: 100 mg/dL — ABNORMAL HIGH (ref 70–99)
Glucose-Capillary: 80 mg/dL (ref 70–99)

## 2012-09-17 MED ORDER — WARFARIN SODIUM 2.5 MG PO TABS
2.5000 mg | ORAL_TABLET | Freq: Once | ORAL | Status: AC
  Administered 2012-09-17: 2.5 mg via ORAL
  Filled 2012-09-17: qty 1

## 2012-09-17 NOTE — Progress Notes (Signed)
Clinical Social Worker staffed case with PCCM MD.  CSW to continue to follow and assist as needed.   [name], MSW, [place] [phone]

## 2012-09-17 NOTE — Progress Notes (Signed)
Seen and examined.  He is feeling better every day.  We have good dispo staging at this point.  Last plasmaphoresis will be 4/10.  Remove groin catheter post plasma phoresis.  PT to evaluate once catheter out.  Clearly, he will need rehab of some sort.  Question is where/what level  Other issue is diet.  Await modified barium swallow before advancing diet.

## 2012-09-17 NOTE — Progress Notes (Signed)
NUTRITION FOLLOW UP  Intervention:   1.  Enteral nutrition; continue current regimen of Jevity 1.2 @ 70 mL/hr and decrease Prostat to BID to provide 2216 kcal, 124g protein, 1356 ml free water. Question whether pt may be ready to transition to bolus feeds during the day to simulate meal intake and provide greater satiety.  Defer initiation to MD based due to respiratory status.  If appropriate, recommend 1 can 4 times daily infused via pump over 30 minutes and continuous cyclic feeds overnight at 70 mL/hr from 7p to 7a.  Ultimately, pt may be able to tolerate 2 cans 4 times daily (for a total of 8 cans) to meet 100% estimated needs.  2.  Fluid; once IVF d/c'd, pt will need 200 mL fluid bolus 4 times daily.  Nutrition Dx:   Inadequate oral intake, ongoing  Goal:  Intake to meet >90% of estimated nutrition needs. Met with TF.  Monitor:  weight trends, lab trends, I/O's, TF tolerance   Assessment:   Pt admitted with SOB and abscesses.  Pt with h/o myastenia gravis requiring trach and PEG.  Pt initially resumed home regimen, however required changes while intubated.  Currently on trach collar.  Ongoing plasmaphoresis- last exchange to occur tomorrow.   Pt asking about when he will be able to eat.  Pt completed MBS today with SLP who recommended continuing NPO status with alternate means of nutrition while pt continues with strengthening exercises.    Pt remains on trach collar.   Residuals: 5 mL at last check.    Height: Ht Readings from Last 1 Encounters:  09/03/12 6' (1.829 m)    Weight Status:   Wt Readings from Last 1 Encounters:  09/17/12 158 lb 1.1 oz (71.7 kg)    Re-estimated needs:  Kcal: 2000-2200 Protein: 90-110g Fluid: 2 L/day  Skin: Stage 2 to sacrum  Diet Order:     Intake/Output Summary (Last 24 hours) at 09/17/12 0946 Last data filed at 09/17/12 0900  Gross per 24 hour  Intake   2070 ml  Output   2553 ml  Net   -483 ml    Last BM:  4/8   Labs:   Recent Labs Lab 09/15/12 0515 09/16/12 0530 09/17/12 0600  NA 139 139 142  K 3.3* 3.5 3.6  CL 103 102 105  CO2 32 33* 31  BUN 16 17 17   CREATININE 0.47* 0.44* 0.49*  CALCIUM 8.8 8.7 8.6  GLUCOSE 112* 105* 102*    CBG (last 3)   Recent Labs  09/16/12 0756 09/16/12 1643 09/17/12 0022  GLUCAP 126* 105* 100*    Scheduled Meds: . antiseptic oral rinse  15 mL Mouth Rinse QID  . aspirin  81 mg Per Tube Daily  . calcium gluconate  2 g Intravenous Once  . chlorhexidine  15 mL Mouth Rinse BID  . feeding supplement  30 mL Per Tube BID  . pantoprazole sodium  40 mg Per Tube Q1200  . predniSONE  40 mg Per Tube Q breakfast  . pyridostigmine  90 mg Per Tube 5 X Daily  . sodium chloride  3 mL Intravenous Q12H  . sterile water for irrigation  200 mL Irrigation Q4H  . vancomycin  750 mg Intravenous Q8H  . Warfarin - Pharmacist Dosing Inpatient   Does not apply q1800    Continuous Infusions: . citrate dextrose    . feeding supplement (JEVITY 1.2 CAL) 1,000 mL (09/17/12 0300)    Loyce Dys, MS RD LDN Clinical  Inpatient Dietitian Pager: [phone] Weekend/After hours pager: [phone]

## 2012-09-17 NOTE — Progress Notes (Signed)
Pt report called to Morrie Sheldon, RN on 2000.  Pt transferred via bed, IV, O2 (TC), and tele.  Pt father was called and message left about new location.  Salomon Mast, RN

## 2012-09-17 NOTE — Progress Notes (Signed)
ANTICOAGULATION CONSULT NOTE - Follow Up Consult  Pharmacy Consult for Coumadin Indication: DVT  No Known Allergies  Patient Measurements: Height: 6' (182.9 cm) Weight: 158 lb 1.1 oz (71.7 kg) IBW/kg (Calculated) : 77.6  Vital Signs: Temp: 98.3 F (36.8 C) (04/09 0802) Temp src: Oral (04/09 0802) BP: 95/55 mmHg (04/09 0802) Pulse Rate: 68 (04/09 0802)  Labs:  Recent Labs  09/15/12 0515 09/16/12 0530 09/17/12 0600  HGB 11.1* 11.2* 11.2*  HCT 32.3* 33.7* 33.0*  PLT 199 216 192  LABPROT 17.5* 16.2* 22.7*  INR 1.48 1.33 2.10*  CREATININE 0.47* 0.44* 0.49*    Estimated Creatinine Clearance: 104.6 ml/min (by C-G formula based on Cr of 0.49).   Medications:  Scheduled:  . [COMPLETED] therapeutic plasma exchange solution   Dialysis Q1 Hr x 3  . antiseptic oral rinse  15 mL Mouth Rinse QID  . aspirin  81 mg Per Tube Daily  . calcium gluconate  2 g Intravenous Once  . chlorhexidine  15 mL Mouth Rinse BID  . [COMPLETED] citrate dextrose      . feeding supplement  30 mL Per Tube BID  . [COMPLETED] heparin  1,000 Units Intracatheter Once  . pantoprazole sodium  40 mg Per Tube Q1200  . predniSONE  40 mg Per Tube Q breakfast  . pyridostigmine  90 mg Per Tube 5 X Daily  . sodium chloride  3 mL Intravenous Q12H  . sterile water for irrigation  200 mL Irrigation Q4H  . vancomycin  750 mg Intravenous Q8H  . [COMPLETED] warfarin  7.5 mg Oral ONCE-1800  . Warfarin - Pharmacist Dosing Inpatient   Does not apply q1800  . [COMPLETED] therapeutic plasma exchange solution   Dialysis Once in dialysis  . [DISCONTINUED] predniSONE  50 mg Per Tube Q breakfast    Assessment: 57 year old male on anticoagulation with Coumadin for recent DVT (1/14).  INR on admit was supratherapeutic.  INR is up to 2.1 today which is therapeutic but with a large increase from yesterday. No bleeding noted, CBC stable.  PTA Coumadin regimen: 7.5mg  daily except 5mg  MWF.   Goal of Therapy:  INR 2-3    Plan:  Decrease Coumadin to 2.5mg  today Daily PT/INR  Estella Husk, Pharm.D., BCPS Clinical Pharmacist Phone: [phone] or [phone] Pager: [phone] 09/17/2012, 9:45 AM

## 2012-09-17 NOTE — Progress Notes (Signed)
Patient ID: Victor Durham, male   DOB: [birth year], 57 y.o.   MRN: [id number] Daily Progress Note Family Medicine Teaching Service Service Pager: [phone]  Subjective:  Patient reports his breathing is good, about the same as yesterday. He still wants to eat.   PT was unable to work with him with his plasmapheresis cath in his leg, this will be taken out as soon as possible after his last exchange tomorrow. .  Objective:  Temp:  [97 F (36.1 C)-97.8 F (36.6 C)] 97.5 F (36.4 C) (04/09 0438) Pulse Rate:  [61-95] 72 (04/09 0438) Resp:  [13-25] 18 (04/09 0438) BP: (85-105)/(40-66) 104/55 mmHg (04/09 0438) SpO2:  [98 %-100 %] 100 % (04/09 0438) FiO2 (%):  [28 %] 28 % (04/09 0438) Weight:  [156 lb 15.5 oz (71.2 kg)-158 lb 1.1 oz (71.7 kg)] 158 lb 1.1 oz (71.7 kg) (04/09 0438)  Intake/Output Summary (Last 24 hours) at 09/17/12 0722 Last data filed at 09/17/12 0600  Gross per 24 hour  Intake   1840 ml  Output   2353 ml  Net   -513 ml   Physical Exam: General: resting comfortably in bed. Cooperative through exams. Eyes: Left lid ptosis >R Heart: RRR. No murmurs, rubs, or gallops.  Lungs: Normal WOB, CTAB Abd: PEG in place with feeds. Site clean/dry/intact., S/NT/ND Back: Dressing in place.  Clean, dry, intact.   Extremities: RLE - dressing in place; clean, dry and intact.  Neuro: moves all extremities, moves all 4 appendages easily  Labs and imaging:  CBC  Recent Labs Lab 09/15/12 0515 09/16/12 0530 09/17/12 0600  WBC 7.3 7.0 6.5  HGB 11.1* 11.2* 11.2*  HCT 32.3* 33.7* 33.0*  PLT 199 216 192   BMET/CMET  Recent Labs Lab 09/11/12 0500  09/15/12 0515 09/16/12 0530 09/17/12 0600  NA 139  < > 139 139 142  K 3.0*  < > 3.3* 3.5 3.6  CL 106  < > 103 102 105  CO2 28  < > 32 33* 31  BUN 19  < > 16 17 17   CREATININE 0.50  < > 0.47* 0.44* 0.49*  CALCIUM 8.6  < > 8.8 8.7 8.6  PROT 5.9*  --   --   --   --   BILITOT 0.9  --   --   --   --   ALKPHOS 38*  --   --   --    --   ALT 40  --   --   --   --   AST 19  --   --   --   --   GLUCOSE 111*  < > 112* 105* 102*  < > = values in this interval not displayed.  Scheduled Medications: . antiseptic oral rinse  15 mL Mouth Rinse QID  . aspirin  81 mg Per Tube Daily  . calcium gluconate  2 g Intravenous Once  . chlorhexidine  15 mL Mouth Rinse BID  . feeding supplement  30 mL Per Tube BID  . pantoprazole sodium  40 mg Per Tube Q1200  . predniSONE  40 mg Per Tube Q breakfast  . pyridostigmine  90 mg Per Tube 5 X Daily  . sodium chloride  3 mL Intravenous Q12H  . sterile water for irrigation  200 mL Irrigation Q4H  . vancomycin  750 mg Intravenous Q8H  . Warfarin - Pharmacist Dosing Inpatient   Does not apply q1800   Continuous Infusions: . citrate dextrose    .  feeding supplement (JEVITY 1.2 CAL) 1,000 mL (09/17/12 0300)   No results found.  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 Greatly improved over the last two days, was able to voice his opinion yesterday,of wanting different arrangements to be considered for discharge. Has been able to maintain 28% trach collar for second night, although 5LNC has been started.  Myasthenic Crisis - 28% trach collar with 5LNC - thought related to increased prednisone abruptly (given for stress dose) but possibly due to infection given crisis started <12 hours after admin high dose prednisone and typically 5-10 days after increased dose of prednisone, Likely exacerbated by MRSA bacteremia  - Neurology following and we greatly appreciate their help in managing this patient.  - Now s/p 5 day course of IV IG  - on high dose  prednisone 50mg  (Tapering) in addition to pyridostigmine via neuro recommendations. - S/P plasma exchange on 4/2 , 4/4, 4/6 , 4/8 and last dose due tomorrow - Pulmonology consulted- appreciate recs and management of vent; attempt to wean with trach trials as tolerated. 28% this morning. - Speech evaluation will be repeated today  MRSA bacteremia -likely source RLE cellulitis and multiple abscess. MRI tib/fib showed cellulitis, with no infiltration to muscle/bone.  - ID s/o, appreciate reccs -TEE resulted with 65% EF, trivial PR and AI. No endocarditis.  - 4 weeks IV vanc from 3/25 per ID, last day will be April 22nd; weekly vanc trough needed. - Vanc trough Goal 15-20. Next due 4/15  H/o DVT - Coumadin per pharmacy.   Respiratory failure since 4/2, trach dependent - Currently on/off vent. attmepting trach trials, defer management to pulmonology, again we appreciate their recs and management - Needs to be on 28% Trach in order for current placement to take him back.   Wounds - banadges clean/dry/inatc--> wounds are packed and are healing well, without drainage - R leg, Sacrum, R Back - Nursing dressing wounds daily.   Hypokalemia - Potassium 3.5--> 3.1--> 3.3--> 3.5--> 3.6 - replete per PEG, as needed  FEN/GI -  KVO, continue Peg feeds, attempting speech evaluation again today  Ppx- coumadin per pharmacy. Dispo - pending continued clinical improvement. Patient does not desire to return to [facility], he would like to be in inpatient rehab or go home. Will re-contact social work to re-evaluate dispo situation.  Code: FULL code Contact today: Hamp Moreland, father: [phone]; Would like contacted every couple of days, last call 4/8.

## 2012-09-17 NOTE — Progress Notes (Signed)
PULMONARY  / CRITICAL CARE MEDICINE  Name: Victor Durham MRN: [id number] DOB: [birth year]    ADMISSION DATE:  08/31/2012 CONSULTATION DATE:  09/03/2012  REFERRING MD :  FPTS  CHIEF COMPLAINT:  Hypercarbic respiratory failure  BRIEF PATIENT DESCRIPTION: 43 yowm with myasthenia gravis and chronic PEG/ tracheostomy admitted 3/23 with RLE cellulitis and G+ bacteremia.  Became obtunded on 3/26 and ABG reveled profound hypercarbia / acidosis.  Transferred to ICU.  SIGNIFICANT EVENTS / STUDIES:  3/23  Admitted with RLE cellulitis 3/24  RLL MRI >>> Focal subcutaneous fat soft tissue ulceration with a 13 mm pocket of pus at the inferior lateral aspect of the ulceration. No deep abscesses or myositis or osteomyelitis.  Circumferential soft tissue edema in the right lower leg which could represent cellulitis 3/26  Developed acute hypercarbic respiratory failure, transferred to ICU, cuffed tracheostomy tube placed, mechanical ventilation instituted 3/25  2D echo >>>  Poor quality for evaluation of endocarditis 3/26  Head CT >>>Neg 3/26 rx  IVIG 3/29 AM  D/c vent  4/1: TEE: no evidence of endocarditis  4/2: Started Plasma Exchange 4/8: Off vent 24 hrs/day   LINES / TUBES: Chronic tracheostomy Chronic PEG R PICC 3/25 >>> RF HD Cath 4/2 >>>  CULTURES: Abscess 3/23 >>> MRSA Blood 3/23 >>> MRSA x2  [facility] 3/25 x 1 - neg Blood 3/26 x2 - neg Urine 3/27 > neg  ANTIBIOTICS: Vancomycin 3/23 >>  Subjective: Remains on ATC without distress or new complaints  VITAL SIGNS: Temp:  [97.5 F (36.4 C)-98.7 F (37.1 C)] 98.7 F (37.1 C) (04/09 1441) Pulse Rate:  [61-95] 71 (04/09 1441) Resp:  [15-25] 19 (04/09 1441) BP: (86-104)/(40-59) 102/59 mmHg (04/09 1441) SpO2:  [98 %-100 %] 98 % (04/09 1441) FiO2 (%):  [28 %] 28 % (04/09 1556) Weight:  [158 lb 1.1 oz (71.7 kg)] 158 lb 1.1 oz (71.7 kg) (04/09 0438)      VENTILATOR SETTINGS: Vent Mode:  [-]  FiO2 (%):  [28 %] 28 %  INTAKE /  OUTPUT: Intake/Output     04/08 0701 - 04/09 0700 04/09 0701 - 04/10 0700   I.V. (mL/kg) 100 (1.4) 10 (0.1)   Other 770 490   NG/GT 600 200   IV Piggyback 450 150   Total Intake(mL/kg) 1920 (26.8) 850 (11.9)   Urine (mL/kg/hr) 2450 (1.4) 725 (1)   Stool 3 (0)    Total Output 2453 725   Net -533 +125        Urine Occurrence 1 x    Stool Occurrence  1 x    PHYSICAL EXAMINATION: General: NAD Neuro:  RASS 0, MAEs with good strength. Bilateral lid lag noted HEENT: [place]/AT, trach site clean Cardiovascular:  RRR s M Lungs: few Watson rhonchi Abdomen:  Soft, NT, NABS Ext: warm, no edema   LABS:  Recent Labs Lab 09/15/12 0515 09/16/12 0530 09/17/12 0600  NA 139 139 142  K 3.3* 3.5 3.6  CL 103 102 105  CO2 32 33* 31  BUN 16 17 17   CREATININE 0.47* 0.44* 0.49*  GLUCOSE 112* 105* 102*    Recent Labs Lab 09/15/12 0515 09/16/12 0530 09/17/12 0600  HGB 11.1* 11.2* 11.2*  HCT 32.3* 33.7* 33.0*  WBC 7.3 7.0 6.5  PLT 199 216 192      Recent Labs Lab 09/15/12 1622 09/15/12 2343 09/16/12 0756 09/16/12 1643 09/17/12 0022  GLUCAP 111* 82 126* 105* 100*   CXR:   No new CXR.  ASSESSMENT / PLAN:  Acute on chronic hypercarbic respiratory failure in setting of myasthenic crisis related to acute infection (prob 1&2).   - Cont ATC as long as tolerated    MRSA bacteremia/ TEE negative for endocarditis on 4/1.  P: - 4 weeks of IV vanc (starting from 3/25) per ID  MG.  Likely myasthenia crisis exacerbated by acute infection (prob #1) >now better after plasma exchange  - Head CT negative 3/26 -Neuro note 4/6 appreciated. Tapering pred, continuing plasmapheresis. P:   - Mgmt per Neuro    History of DVT.  P:  - Cont warfarin per pharmacy.   Wounds:  Per WOC recs  He is stable for transfer to med-surg bed. Once plasmapheresis is completed, he should be ready for discharge to SNF. He should have ongoing PT/OT and SLP follow-up after discharge. There are no plans for  decannulation @ this time  PCCM will sign off. Please call if we can be of further assistance    [name], MD ; [facility] [phone].  After 5:30 PM or weekends, call [phone]

## 2012-09-17 NOTE — Progress Notes (Signed)
Subjective: Patient awake and alert.  On ventilator.  Has had 4/5 plasmaphoresis treatments.  Last is scheduled for tomorrow.    Objective: Current vital signs: BP 102/59  Pulse 71  Temp(Src) 98.7 F (37.1 C) (Oral)  Resp 19  Ht 6' (1.829 m)  Wt 71.7 kg (158 lb 1.1 oz)  BMI 21.43 kg/m2  SpO2 98% Vital signs in last 24 hours: Temp:  [97 F (36.1 C)-98.7 F (37.1 C)] 98.7 F (37.1 C) (04/09 1441) Pulse Rate:  [61-95] 71 (04/09 1441) Resp:  [13-25] 19 (04/09 1441) BP: (86-105)/(40-66) 102/59 mmHg (04/09 1441) SpO2:  [98 %-100 %] 98 % (04/09 1441) FiO2 (%):  [28 %] 28 % (04/09 1147) Weight:  [71.7 kg (158 lb 1.1 oz)] 71.7 kg (158 lb 1.1 oz) (04/09 0438)  Intake/Output from previous day: 04/08 0701 - 04/09 0700 In: 1920 [I.V.:100; NG/GT:600; IV Piggyback:450] Out: 2453 [Urine:2450; Stool:3] Intake/Output this shift: Total I/O In: 780 [I.V.:10; Other:420; NG/GT:200; IV Piggyback:150] Out: 600 [Urine:600] Nutritional status:    Neurologic Exam: Mental Status: Alert.  Nods in response to questioning.  Follows commands.  Cranial Nerves: II: Discs flat bilaterally; Visual fields grossly normal, pupils equal, round, reactive to light and accommodation III,IV, VI: left ptosis, extra-ocular motions intact bilaterally V,VII: smile symmetric, facial light touch sensation normal bilaterally VIII: hearing normal bilaterally IX,X: gag reflex present XI: bilateral shoulder shrug XII: midline tongue extension Motor: Able to lift all extremities off the bed symetrically Sensory: Pinprick and light touch intact throughout, bilaterally Plantars: Right: mute   Left: mute   Lab Results: Basic Metabolic Panel:  Recent Labs Lab 09/13/12 0823 09/14/12 0202 09/14/12 0545 09/15/12 0515 09/16/12 0530 09/17/12 0600  NA 138 140 139 139 139 142  K 3.5 3.2* 3.1* 3.3* 3.5 3.6  CL 107 103 107 103 102 105  CO2 26  --  28 32 33* 31  GLUCOSE 122* 98 119* 112* 105* 102*  BUN 16 15 15 16  17 17   CREATININE 0.46* 0.70 0.49* 0.47* 0.44* 0.49*  CALCIUM 8.5  --  8.7 8.8 8.7 8.6    Liver Function Tests:  Recent Labs Lab 09/11/12 0500  AST 19  ALT 40  ALKPHOS 38*  BILITOT 0.9  PROT 5.9*  ALBUMIN 3.3*   No results found for this basename: LIPASE, AMYLASE,  in the last 168 hours No results found for this basename: AMMONIA,  in the last 168 hours  CBC:  Recent Labs Lab 09/13/12 0823 09/14/12 0202 09/14/12 0545 09/15/12 0515 09/16/12 0530 09/17/12 0600  WBC 7.9  --  7.1 7.3 7.0 6.5  HGB 11.0* 10.9* 10.6* 11.1* 11.2* 11.2*  HCT 32.6* 32.0* 31.5* 32.3* 33.7* 33.0*  MCV 87.6  --  88.0 87.8 89.4 89.2  PLT 174  --  196 199 216 192    Cardiac Enzymes: No results found for this basename: CKTOTAL, CKMB, CKMBINDEX, TROPONINI,  in the last 168 hours  Lipid Panel: No results found for this basename: CHOL, TRIG, HDL, CHOLHDL, VLDL, LDLCALC,  in the last 168 hours  CBG:  Recent Labs Lab 09/15/12 1622 09/15/12 2343 09/16/12 0756 09/16/12 1643 09/17/12 0022  GLUCAP 111* 82 126* 105* 100*    Microbiology: Results for orders placed during the hospital encounter of 08/31/12  CULTURE, BLOOD (ROUTINE X 2)     Status: None   Collection Time    08/31/12 11:07 AM      Result Value Range Status   Specimen Description BLOOD RIGHT ARM  Final   Special Requests BOTTLES DRAWN AEROBIC AND ANAEROBIC 10CC   Final   Culture  Setup Time 08/31/2012 20:34   Final   Culture     Final   Value: STAPHYLOCOCCUS AUREUS     Note: SUSCEPTIBILITIES PERFORMED ON PREVIOUS CULTURE WITHIN THE LAST 5 DAYS.     Note: Gram Stain Report Called to,Read Back By and Verified With: PAULINE COLQUHOUN 09/01/12 0830 BY [email or web address]   Report Status 09/03/2012 FINAL   Final  CULTURE, BLOOD (ROUTINE X 2)     Status: None   Collection Time    08/31/12 11:14 AM      Result Value Range Status   Specimen Description BLOOD RIGHT HAND   Final   Special Requests BOTTLES DRAWN AEROBIC AND ANAEROBIC [facility]    Final   Culture  Setup Time 08/31/2012 20:34   Final   Culture     Final   Value: METHICILLIN RESISTANT STAPHYLOCOCCUS AUREUS     Note: RIFAMPIN AND GENTAMICIN SHOULD NOT BE USED AS SINGLE DRUGS FOR TREATMENT OF STAPH INFECTIONS. CRITICAL RESULT CALLED TO, READ BACK BY AND VERIFIED WITH: LISA JOHNSON @ 1352 09/02/12 BY KRAWS This organism DOES NOT demonstrate inducible Clindamycin      resistance in vitro.     Note: Gram Stain Report Called to,Read Back By and Verified With: PAULINE COLQUHOUN 09/01/12 0830 BY [email or web address]   Report Status 09/03/2012 FINAL   Final   Organism ID, Bacteria METHICILLIN RESISTANT STAPHYLOCOCCUS AUREUS   Final  CULTURE, ROUTINE-ABSCESS     Status: None   Collection Time    08/31/12 12:07 PM      Result Value Range Status   Specimen Description ABSCESS RIGHT LOWER LEG   Final   Special Requests NONE   Final   Gram Stain     Final   Value: RARE WBC PRESENT,BOTH PMN AND MONONUCLEAR     NO SQUAMOUS EPITHELIAL CELLS SEEN     FEW GRAM POSITIVE COCCI     IN PAIRS IN CLUSTERS   Culture     Final   Value: MODERATE METHICILLIN RESISTANT STAPHYLOCOCCUS AUREUS     Note: RIFAMPIN AND GENTAMICIN SHOULD NOT BE USED AS SINGLE DRUGS FOR TREATMENT OF STAPH INFECTIONS. This organism DOES NOT demonstrate inducible Clindamycin resistance in vitro. CRITICAL RESULT CALLED TO, READ BACK BY AND VERIFIED WITH: GENEVIEVE RN BY      INGRAM A 3/26 840AM   Report Status 09/03/2012 FINAL   Final   Organism ID, Bacteria METHICILLIN RESISTANT STAPHYLOCOCCUS AUREUS   Final  MRSA PCR SCREENING     Status: None   Collection Time    08/31/12  5:22 PM      Result Value Range Status   MRSA by PCR NEGATIVE  NEGATIVE Final   Comment:            The GeneXpert MRSA Assay (FDA     approved for NASAL specimens     only), is one component of a     comprehensive MRSA colonization     surveillance program. It is not     intended to diagnose MRSA     infection nor to guide or     monitor treatment for      MRSA infections.  CULTURE, BLOOD (SINGLE)     Status: None   Collection Time    09/02/12  4:48 PM      Result Value Range Status   Specimen Description BLOOD RIGHT ANTECUBITAL   Final  Special Requests BOTTLES DRAWN AEROBIC ONLY 5CC   Final   Culture  Setup Time 09/02/2012 22:23   Final   Culture NO GROWTH 5 DAYS   Final   Report Status 09/08/2012 FINAL   Final  CULTURE, BLOOD (ROUTINE X 2)     Status: None   Collection Time    09/03/12  4:30 AM      Result Value Range Status   Specimen Description BLOOD LEFT ARM   Final   Special Requests BOTTLES DRAWN AEROBIC AND ANAEROBIC 10CC    Final   Culture  Setup Time 09/03/2012 08:52   Final   Culture NO GROWTH 5 DAYS   Final   Report Status 09/09/2012 FINAL   Final  CULTURE, BLOOD (ROUTINE X 2)     Status: None   Collection Time    09/03/12  4:40 AM      Result Value Range Status   Specimen Description BLOOD LEFT HAND   Final   Special Requests BOTTLES DRAWN AEROBIC AND ANAEROBIC 10CC   Final   Culture  Setup Time 09/03/2012 08:52   Final   Culture NO GROWTH 5 DAYS   Final   Report Status 09/09/2012 FINAL   Final  URINE CULTURE     Status: None   Collection Time    09/04/12  9:02 PM      Result Value Range Status   Specimen Description URINE, CATHETERIZED   Final   Special Requests NONE   Final   Culture  Setup Time 09/05/2012 03:03   Final   Colony Count NO GROWTH   Final   Culture NO GROWTH   Final   Report Status 09/06/2012 FINAL   Final    Coagulation Studies:  Recent Labs  09/15/12 0515 09/16/12 0530 09/17/12 0600  LABPROT 17.5* 16.2* 22.7*  INR 1.48 1.33 2.10*    Imaging: Dg Swallowing Func-speech Pathology  09/17/2012  [name] [place], CCC-SLP     09/17/2012 12:31 PM Objective Swallowing Evaluation: Modified Barium Swallowing Study   Patient Details  Name: DENZELL COLASANTI MRN: [id number] Date of Birth: [birth year]  Today's Date: 09/17/2012 Time: 4098-1191 SLP Time Calculation (min): 25 min  Past Medical  History:  Past Medical History  Diagnosis Date  . DVT (deep venous thrombosis)   . Hypertension   . High cholesterol   . Myasthenia gravis   . Anxiety   . Dysphagia 06/30/12   Past Surgical History:  Past Surgical History  Procedure Laterality Date  . Esophagogastroduodenoscopy  07/11/2012    Procedure: ESOPHAGOGASTRODUODENOSCOPY (EGD);  Surgeon: Shirley Friar, MD;  Location: [facility];  Service: Endoscopy;   Laterality: N/A;  BEDSIDE  . Tracheostomy N/A   . Tee without cardioversion N/A 09/09/2012    Procedure: TRANSESOPHAGEAL ECHOCARDIOGRAM (TEE);  Surgeon:  Dolores Patty, MD;  Location: [facility] ENDOSCOPY;  Service:  Cardiovascular;  Laterality: N/A;   HPI:  58 y/o homeless male now at SNF, recnet dx during admission 1/30  with Myasthenia Gravis and aspiration pna. Mestinon,  plasmapheresis started 2/4. ETT 1/30-2/9. Trach placed 2/11, PEG  2/13. Pt evaluated on 2/14 for PMSV, by 2/26 prior to d/c pt was  tolerating during all waking hours with full supervision moving  toward intermittent supervision. Pt d/c'd to SNF.  Readmitted  3/23 with RLE cellulitis and G+ bacteremia. Became obtunded on  3/26 and ABG reveled profound hypercarbia / acidosis. off the  vent on 3/29.  Pt. tolerating PMSV with supervision.  Has  not  required noctornal vent past several nights.  Exhibits  intermittent wet vocal quality indicative of decreased management  of secretions, however he has not had an objective swallow  evaluation during prior admission, and a baseline MBS recommended  for today.        Assessment / Plan / Recommendation Clinical Impression  Dysphagia Diagnosis: Severe pharyngeal phase dysphagia;Mild  cervical esophageal phase dysphagia Clinical impression: MBS performed today with PMSV donned.  Oral  phase appreared functional for limited po presented.  Pharyngeal  phase impairments are severe and motor based with a mostly absent  pharyngeal contraction, significantly decreased laryngeal  elevation with partial  epiglottic deflection.  Maximum vallecular  residue from reduced tongue based retraction with good sensation.   Approximatley 6 dry/additional swallows were unsuccessful in  decreasing residual.  Pt. refused to attempt honey thick barium  stating "I'm just not ready."  Recommend he continue NPO status  and initiate tongue base and pharyngeal strengthening exercises.       Treatment Recommendation  Therapy as outlined in treatment plan below    Diet Recommendation NPO   Medication Administration: Via alternative means    Other  Recommendations Oral Care Recommendations: Oral care BID   Follow Up Recommendations  Skilled Nursing facility    Frequency and Duration min 2x/week  2 weeks   Pertinent Vitals/Pain none    SLP Swallow Goals Goal #3: Pt. will demonstrate tongue base and pharyngeal  strengthening exercises given moderate verbal/visual/tactile  cues.      Reason for Referral Objectively evaluate swallowing function   Oral Phase Oral Preparation/Oral Phase Oral Phase: WFL   Pharyngeal Phase Pharyngeal Phase Pharyngeal Phase: Impaired Pharyngeal - Solids Pharyngeal - Puree: Delayed swallow initiation;Premature spillage  to valleculae;Reduced pharyngeal peristalsis;Reduced epiglottic  inversion;Reduced anterior laryngeal mobility;Reduced laryngeal  elevation;Reduced airway/laryngeal closure;Reduced tongue base  retraction;Pharyngeal residue - valleculae;Pharyngeal residue -  pyriform sinuses Pharyngeal Phase - Comment Pharyngeal Comment:  (unable to effectively reduce residual)  Cervical Esophageal Phase    GO    Cervical Esophageal Phase Cervical Esophageal Phase: Impaired Cervical Esophageal Phase - Solids Puree:  (appeared to have tight UES)         [name] M.Ed CCC-SLP Pager [phone]  09/17/2012      Medications:  I have reviewed the patient's current medications. Scheduled: . antiseptic oral rinse  15 mL Mouth Rinse QID  . aspirin  81 mg Per Tube Daily  . calcium gluconate  2 g Intravenous  Once  . chlorhexidine  15 mL Mouth Rinse BID  . feeding supplement  30 mL Per Tube BID  . pantoprazole sodium  40 mg Per Tube Q1200  . predniSONE  40 mg Per Tube Q breakfast  . pyridostigmine  90 mg Per Tube 5 X Daily  . sodium chloride  3 mL Intravenous Q12H  . sterile water for irrigation  200 mL Irrigation Q4H  . vancomycin  750 mg Intravenous Q8H  . warfarin  2.5 mg Oral ONCE-1800  . Warfarin - Pharmacist Dosing Inpatient   Does not apply q1800    Assessment/Plan: Patient doing well.  Reports feeling stronger.  Due for last treatment with plasmaphoresis tomorrow   Recommendations: 1.  Continue Prednisone and Mestinon at current doses.   2.  Plasmaphoresis tomorrow.     LOS: 17 days   Thana Farr, MD [facility] [phone] 09/17/2012  2:45 PM

## 2012-09-17 NOTE — Progress Notes (Signed)
PT Cancellation Note  Patient Details Name: Victor Durham MRN: [id number] DOB: 11-May-1956   Cancelled Treatment:    Reason Eval/Treat Not Completed: Medical issues which prohibited therapy. Pt con't to have temporary plasmapheresis femoral cath. Will hold until cath removed. PT to re-attempt as able.    Marcene Brawn 09/17/2012, 10:45 AM  Lewis Shock, PT, DPT Pager #: [phone] Office #: [phone]

## 2012-09-17 NOTE — Procedures (Signed)
Objective Swallowing Evaluation: Modified Barium Swallowing Study  Patient Details  Name: Victor Durham MRN: [id number] Date of Birth: 1955-06-20  Today's Date: 09/17/2012 Time: 4098-1191 SLP Time Calculation (min): 25 min  Past Medical History:  Past Medical History  Diagnosis Date  . DVT (deep venous thrombosis)   . Hypertension   . High cholesterol   . Myasthenia gravis   . Anxiety   . Dysphagia 06/30/12   Past Surgical History:  Past Surgical History  Procedure Laterality Date  . Esophagogastroduodenoscopy  07/11/2012    Procedure: ESOPHAGOGASTRODUODENOSCOPY (EGD);  Surgeon: Shirley Friar, MD;  Location: [facility];  Service: Endoscopy;  Laterality: N/A;  BEDSIDE  . Tracheostomy N/A   . Tee without cardioversion N/A 09/09/2012    Procedure: TRANSESOPHAGEAL ECHOCARDIOGRAM (TEE);  Surgeon: Dolores Patty, MD;  Location: [facility] ENDOSCOPY;  Service: Cardiovascular;  Laterality: N/A;   HPI:  57 y/o homeless male now at SNF, recnet dx during admission 1/30 with Myasthenia Gravis and aspiration pna. Mestinon, plasmapheresis started 2/4. ETT 1/30-2/9. Trach placed 2/11, PEG 2/13. Pt evaluated on 2/14 for PMSV, by 2/26 prior to d/c pt was tolerating during all waking hours with full supervision moving toward intermittent supervision. Pt d/c'd to SNF.  Readmitted 3/23 with RLE cellulitis and G+ bacteremia. Became obtunded on 3/26 and ABG reveled profound hypercarbia / acidosis. off the vent on 3/29.  Pt. tolerating PMSV with supervision.  Has not required noctornal vent past several nights.  Exhibits intermittent wet vocal quality indicative of decreased management of secretions, however he has not had an objective swallow evaluation during prior admission, and a baseline MBS recommended for today.        Assessment / Plan / Recommendation Clinical Impression  Dysphagia Diagnosis: Severe pharyngeal phase dysphagia;Mild cervical esophageal phase dysphagia Clinical impression: MBS  performed today with PMSV donned.  Oral phase appreared functional for limited po presented.  Pharyngeal phase impairments are severe and motor based with a mostly absent pharyngeal contraction, significantly decreased laryngeal elevation with partial epiglottic deflection.  Maximum vallecular residue from reduced tongue based retraction with good sensation.  Approximatley 6 dry/additional swallows were unsuccessful in decreasing residual.  Pt. refused to attempt honey thick barium stating "I'm just not ready."  Recommend he continue NPO status and initiate tongue base and pharyngeal strengthening exercises.      Treatment Recommendation  Therapy as outlined in treatment plan below    Diet Recommendation NPO   Medication Administration: Via alternative means    Other  Recommendations Oral Care Recommendations: Oral care BID   Follow Up Recommendations  Skilled Nursing facility    Frequency and Duration min 2x/week  2 weeks   Pertinent Vitals/Pain none    SLP Swallow Goals Goal #3: Pt. will demonstrate tongue base and pharyngeal strengthening exercises given moderate verbal/visual/tactile cues.      Reason for Referral Objectively evaluate swallowing function   Oral Phase Oral Preparation/Oral Phase Oral Phase: WFL   Pharyngeal Phase Pharyngeal Phase Pharyngeal Phase: Impaired Pharyngeal - Solids Pharyngeal - Puree: Delayed swallow initiation;Premature spillage to valleculae;Reduced pharyngeal peristalsis;Reduced epiglottic inversion;Reduced anterior laryngeal mobility;Reduced laryngeal elevation;Reduced airway/laryngeal closure;Reduced tongue base retraction;Pharyngeal residue - valleculae;Pharyngeal residue - pyriform sinuses Pharyngeal Phase - Comment Pharyngeal Comment:  (unable to effectively reduce residual)  Cervical Esophageal Phase    GO    Cervical Esophageal Phase Cervical Esophageal Phase: Impaired Cervical Esophageal Phase - Solids Puree:  (appeared to have  tight UES)         Misty Stanley  Anne Hahn [employer].Ed [employer] [phone]  09/17/2012

## 2012-09-18 LAB — POCT I-STAT, CHEM 8
BUN: 16 mg/dL (ref 6–23)
Calcium, Ion: 1.22 mmol/L (ref 1.12–1.23)
Chloride: 103 mEq/L (ref 96–112)
Glucose, Bld: 131 mg/dL — ABNORMAL HIGH (ref 70–99)
HCT: 35 % — ABNORMAL LOW (ref 39.0–52.0)

## 2012-09-18 LAB — BASIC METABOLIC PANEL
Chloride: 101 mEq/L (ref 96–112)
Creatinine, Ser: 0.45 mg/dL — ABNORMAL LOW (ref 0.50–1.35)
GFR calc Af Amer: >90 mL/min (ref >90)
Sodium: 137 mEq/L (ref 135–145)

## 2012-09-18 LAB — CBC
MCV: 89.8 fL (ref 78.0–100.0)
Platelets: 202 10*3/uL (ref 150–400)
RDW: 16.9 % — ABNORMAL HIGH (ref 11.5–15.5)
WBC: 6 10*3/uL (ref 4.0–10.5)

## 2012-09-18 LAB — PROTIME-INR
INR: 1.62 — ABNORMAL HIGH (ref 0.00–1.49)
Prothrombin Time: 18.7 seconds — ABNORMAL HIGH (ref 11.6–15.2)

## 2012-09-18 LAB — GLUCOSE, CAPILLARY: Glucose-Capillary: 81 mg/dL (ref 70–99)

## 2012-09-18 MED ORDER — CALCIUM CARBONATE ANTACID 500 MG PO CHEW
2.0000 | CHEWABLE_TABLET | ORAL | Status: DC
  Filled 2012-09-18 (×2): qty 2

## 2012-09-18 MED ORDER — SODIUM CHLORIDE 0.9 % IV SOLN
INTRAVENOUS | Status: AC
  Administered 2012-09-18 (×2): via INTRAVENOUS_CENTRAL
  Filled 2012-09-18 (×3): qty 200

## 2012-09-18 MED ORDER — HEPARIN SODIUM (PORCINE) 1000 UNIT/ML IJ SOLN
1000.0000 [IU] | Freq: Once | INTRAMUSCULAR | Status: DC
  Filled 2012-09-18: qty 1

## 2012-09-18 MED ORDER — WARFARIN SODIUM 7.5 MG PO TABS
7.5000 mg | ORAL_TABLET | Freq: Once | ORAL | Status: AC
  Administered 2012-09-19: 7.5 mg
  Filled 2012-09-18: qty 1

## 2012-09-18 MED ORDER — ACD FORMULA A 0.73-2.45-2.2 GM/100ML VI SOLN
Status: AC
  Filled 2012-09-18: qty 500

## 2012-09-18 NOTE — Progress Notes (Signed)
Pt tolerated tx well no complication during tx, Pt denies pain, SOB or dizziness.

## 2012-09-18 NOTE — Progress Notes (Addendum)
PT Cancellation Note  Patient Details Name: BORA BRONER MRN: [id number] DOB: [birth year]   Cancelled Treatment:    Reason Eval/Treat Not Completed: Medical issues which prohibited therapy;Other (comment) (Still has femoral line in place)Spoke with nursing who states to return tomorrow am.     INGOLD,Malijah Lietz 09/18/2012, 9:58 AM [facility] [phone] (pager)

## 2012-09-18 NOTE — Progress Notes (Signed)
Seen and examined.  Feels good.  Still NPO due to failed swallow study.  For final plasm phoresis today, then PT eval, then dispo.

## 2012-09-18 NOTE — Progress Notes (Signed)
Patient ID: Victor Durham, male   DOB: 1956/05/27, 57 y.o.   MRN: [id number] Daily Progress Note Family Medicine Teaching Service Service Pager: [phone]  Subjective:  Patient reports his breathing is good. He is glad to be in a different room. He reports he has come into a great deal of money today and we discussed restaurants he wants to purchase for investment opportunity. Otherwise, he is good and looking forward to PT today if his plasmapheresis is completed in time.   PT was unable to work with him with his plasmapheresis cath in his leg, this will be taken out as soon as possible after his last exchange today. Objective:  Temp:  [98.1 F (36.7 C)-98.7 F (37.1 C)] 98.1 F (36.7 C) (04/10 0343) Pulse Rate:  [65-80] 68 (04/10 0425) Resp:  [18-22] 18 (04/10 0425) BP: (93-102)/(52-60) 93/52 mmHg (04/10 0343) SpO2:  [98 %-100 %] 100 % (04/10 0425) FiO2 (%):  [28 %] 28 % (04/10 0425) Weight:  [152 lb 14.4 oz (69.355 kg)] 152 lb 14.4 oz (69.355 kg) (04/10 0343)  Intake/Output Summary (Last 24 hours) at 09/18/12 0733 Last data filed at 09/18/12 0648  Gross per 24 hour  Intake   2550 ml  Output   1775 ml  Net    775 ml   Physical Exam: General: resting comfortably in bed. Cooperative through exams. Eyes: Left lid ptosis >R Heart: RRR. No murmurs, rubs, or gallops.  Lungs: Normal WOB, CTAB Abd: PEG in place with feeds. Site clean/dry/intact., S/NT/ND Back: Dressing in place.  Clean, dry, intact.   Extremities: RLE - dressing in place; clean, dry and intact.  Neuro: moves all extremities, moves all 4 appendages easily  Labs and imaging:  CBC  Recent Labs Lab 09/16/12 0530 09/17/12 0600 09/18/12 0512  WBC 7.0 6.5 6.0  HGB 11.2* 11.2* 11.4*  HCT 33.7* 33.0* 33.4*  PLT 216 192 202   BMET/CMET  Recent Labs Lab 09/16/12 0530 09/17/12 0600 09/18/12 0512  NA 139 142 137  K 3.5 3.6 3.2*  CL 102 105 101  CO2 33* 31 32  BUN 17 17 15   CREATININE 0.44* 0.49* 0.45*   CALCIUM 8.7 8.6 8.8  GLUCOSE 105* 102* 97    Scheduled Medications: . antiseptic oral rinse  15 mL Mouth Rinse QID  . aspirin  81 mg Per Tube Daily  . calcium gluconate  2 g Intravenous Once  . chlorhexidine  15 mL Mouth Rinse BID  . feeding supplement  30 mL Per Tube BID  . pantoprazole sodium  40 mg Per Tube Q1200  . predniSONE  40 mg Per Tube Q breakfast  . pyridostigmine  90 mg Per Tube 5 X Daily  . sodium chloride  3 mL Intravenous Q12H  . sterile water for irrigation  200 mL Irrigation Q4H  . vancomycin  750 mg Intravenous Q8H  . Warfarin - Pharmacist Dosing Inpatient   Does not apply q1800   Continuous Infusions: . citrate dextrose    . feeding supplement (JEVITY 1.2 CAL) 70 mL/hr at 09/17/12 2210   Dg Swallowing Func-speech Pathology  09/17/2012  Victor Durham Victor Durham, CCC-SLP     09/17/2012 12:31 PM Objective Swallowing Evaluation: Modified Barium Swallowing Study   Patient Details  Name: Victor Durham MRN: [id number] Date of Birth: [birth year]  Today's Date: 09/17/2012 Time: 2952-8413 SLP Time Calculation (min): 25 min  Past Medical History:  Past Medical History  Diagnosis Date  . DVT (deep venous thrombosis)   .  Hypertension   . High cholesterol   . Myasthenia gravis   . Anxiety   . Dysphagia 06/30/12   Past Surgical History:  Past Surgical History  Procedure Laterality Date  . Esophagogastroduodenoscopy  07/11/2012    Procedure: ESOPHAGOGASTRODUODENOSCOPY (EGD);  Surgeon: Shirley Friar, MD;  Location: [facility];  Service: Endoscopy;   Laterality: N/A;  BEDSIDE  . Tracheostomy N/A   . Tee without cardioversion N/A 09/09/2012    Procedure: TRANSESOPHAGEAL ECHOCARDIOGRAM (TEE);  Surgeon:  Dolores Patty, MD;  Location: [facility] ENDOSCOPY;  Service:  Cardiovascular;  Laterality: N/A;   HPI:  57 y/o homeless male now at SNF, recnet dx during admission 1/30  with Myasthenia Gravis and aspiration pna. Mestinon,  plasmapheresis started 2/4. ETT 1/30-2/9. Trach placed 2/11, PEG  2/13.  Pt evaluated on 2/14 for PMSV, by 2/26 prior to d/c pt was  tolerating during all waking hours with full supervision moving  toward intermittent supervision. Pt d/c'd to SNF.  Readmitted  3/23 with RLE cellulitis and G+ bacteremia. Became obtunded on  3/26 and ABG reveled profound hypercarbia / acidosis. off the  vent on 3/29.  Pt. tolerating PMSV with supervision.  Has not  required noctornal vent past several nights.  Exhibits  intermittent wet vocal quality indicative of decreased management  of secretions, however he has not had an objective swallow  evaluation during prior admission, and a baseline MBS recommended  for today.        Assessment / Plan / Recommendation Clinical Impression  Dysphagia Diagnosis: Severe pharyngeal phase dysphagia;Mild  cervical esophageal phase dysphagia Clinical impression: MBS performed today with PMSV donned.  Oral  phase appreared functional for limited po presented.  Pharyngeal  phase impairments are severe and motor based with a mostly absent  pharyngeal contraction, significantly decreased laryngeal  elevation with partial epiglottic deflection.  Maximum vallecular  residue from reduced tongue based retraction with good sensation.   Approximatley 6 dry/additional swallows were unsuccessful in  decreasing residual.  Pt. refused to attempt honey thick barium  stating "I'm just not ready."  Recommend he continue NPO status  and initiate tongue base and pharyngeal strengthening exercises.       Treatment Recommendation  Therapy as outlined in treatment plan below    Diet Recommendation NPO   Medication Administration: Via alternative means    Other  Recommendations Oral Care Recommendations: Oral care BID   Follow Up Recommendations  Skilled Nursing facility    Frequency and Duration min 2x/week  2 weeks   Pertinent Vitals/Pain none    SLP Swallow Goals Goal #3: Pt. will demonstrate tongue base and pharyngeal  strengthening exercises given moderate verbal/visual/tactile  cues.       Reason for Referral Objectively evaluate swallowing function   Oral Phase Oral Preparation/Oral Phase Oral Phase: WFL   Pharyngeal Phase Pharyngeal Phase Pharyngeal Phase: Impaired Pharyngeal - Solids Pharyngeal - Puree: Delayed swallow initiation;Premature spillage  to valleculae;Reduced pharyngeal peristalsis;Reduced epiglottic  inversion;Reduced anterior laryngeal mobility;Reduced laryngeal  elevation;Reduced airway/laryngeal closure;Reduced tongue base  retraction;Pharyngeal residue - valleculae;Pharyngeal residue -  pyriform sinuses Pharyngeal Phase - Comment Pharyngeal Comment:  (unable to effectively reduce residual)  Cervical Esophageal Phase    GO    Cervical Esophageal Phase Cervical Esophageal Phase: Impaired Cervical Esophageal Phase - Solids Puree:  (appeared to have tight UES)         [name] M.Ed CCC-SLP Pager [phone]  09/17/2012      PRN Medications:sodium chloride, acetaminophen, acetaminophen,  albuterol, calcium carbonate, calcium gluconate IVPB, calcium gluconate IVPB, calcium gluconate, diphenhydrAMINE, fentaNYL, ondansetron (ZOFRAN) IV, ondansetron, sodium chloride, sodium chloride  Assessment  57 yo M with RLE cellulitis and multiple abscesses as well as MRSA bacteremia now with associated myasthenic crisis. Greatly improved over the last two days, was able to voice his opinion yesterday,of wanting different arrangements to be considered for discharge. Has been able to maintain 28% trach collar for second night, although 5LNC has been needed.  Myasthenic Crisis - 28% trach collar with 5LNC - thought related to increased prednisone abruptly (given for stress dose) but possibly due to infection given crisis started <12 hours after admin high dose prednisone and typically 5-10 days after increased dose of prednisone, Likely exacerbated by MRSA bacteremia  - Neurology following and we greatly appreciate their help in managing this patient.  - Now s/p 5 day course of IV IG   - on high dose prednisone 50mg  (Tapering) in addition to pyridostigmine via neuro recommendations. - S/P plasma exchange on 4/2 , 4/4, 4/6 , 4/8 and last dose due today - Pulmonology consulted- appreciate recs and management of vent; attempt to wean with trach trials as tolerated. 28% this morning. - Speech evaluation was completed again yesterday and NPO was recommended. He seemed ok with this today  MRSA bacteremia -likely source RLE cellulitis and multiple abscess. MRI tib/fib showed cellulitis, with no infiltration to muscle/bone.  - ID s/o, appreciate reccs -TEE resulted with 65% EF, trivial PR and AI. No endocarditis.  - 4 weeks IV vanc from 3/25 per ID, last day will be April 22nd; weekly vanc trough needed. - Vanc trough Goal 15-20. Next due 4/15  H/o DVT - Coumadin per pharmacy.   Respiratory failure since 4/2, trach dependent - Currently on/off vent. attmepting trach trials, defer management to pulmonology, again we appreciate their recs and management - Needs to be on 28% Trach in order for current placement to take him back.   Wounds - banadges clean/dry/inatc--> wounds are packed and are healing well, without drainage - R leg, Sacrum, R Back --> willreturn this afternoon to look at sacrum. - Nursing dressing wounds daily.   Hypokalemia - Potassium 3.5--> 3.1--> 3.3--> 3.5--> 3.6--> 3.2 - replete per PEG, as needed    Placed Nursing communication request: Please let family practice know when plasma exchange is complete today. Also inform PT, would like to get cath out ASAP so he can work with PT if OK with other teams   FEN/GI -  KVO, continue Peg feeds, attempting speech evaluation again today  Ppx- coumadin per pharmacy. Dispo - pending continued clinical improvement. Patient does not desire to return to [facility], he would like to be in inpatient rehab or go home. Will re-contact social work to re-evaluate dispo situation.  Code: FULL code Contact today: Farren Nelles, father: [phone]; Would like contacted every couple of days, last call 4/8.

## 2012-09-18 NOTE — Progress Notes (Signed)
ANTICOAGULATION CONSULT NOTE - Follow Up Consult  Pharmacy Consult for Coumadin / Vancomycin Indication: Recent DVT / MRSA bacteremia  No Known Allergies  Patient Measurements: Height: 6' (182.9 cm) Weight: 152 lb 14.4 oz (69.355 kg) IBW/kg (Calculated) : 77.6  Vital Signs: Temp: 98.1 F (36.7 C) (04/10 0343) Temp src: Oral (04/10 0343) BP: 93/52 mmHg (04/10 0343) Pulse Rate: 78 (04/10 0803)  Labs:  Recent Labs  09/16/12 0530 09/16/12 1426 09/17/12 0600 09/18/12 0512  HGB 11.2* 11.9* 11.2* 11.4*  HCT 33.7* 35.0* 33.0* 33.4*  PLT 216  --  192 202  LABPROT 16.2*  --  22.7* 18.7*  INR 1.33  --  2.10* 1.62*  CREATININE 0.44* 0.70 0.49* 0.45*    Assessment: 57 yo male on Coumadin for hx recent DVT (1/14). INR on admit was supratherapeutic (3.3).  INR variable since. Significant increase in INR after 7.5 mg daily x 3 days, and dose decreased to 2.5 mg on 4/9. INR down to 1.62 today. CBC stable, no bleeding noted. Home Coumadin regimen: 7.5mg  TTSS, and 5mg  MWF.   Day # 19 Vancomycin, currently on 750 mg IV q8hr. Last Vancomycin trough level 13.6 mcg/ml on 09/16/12. Scr low stable; doesn't reflect true renal function. Vancomycin to continue for 4 weeks from 3/25 -> thru 09/30/12.  Goal of Therapy:  INR 2-3 Monitor platelets by anticoagulation protocol: Yes Vancomycin trough levels 15-20 mcg/ml  Plan:   Coumadin 7.5 mg x 1 today.  Continue daily PT/INR.   Continue vancomycin 750 mg IV q8hrs.  Weekly vancomycin trough and bmet for duration of course.  Next trough due 4/15.  Dennie Fetters, [place] Pager: [phone] 09/18/2012 12:57 PM

## 2012-09-19 LAB — BASIC METABOLIC PANEL
CO2: 29 mEq/L (ref 19–32)
Glucose, Bld: 96 mg/dL (ref 70–99)
Potassium: 3.5 mEq/L (ref 3.5–5.1)
Sodium: 139 mEq/L (ref 135–145)

## 2012-09-19 LAB — POCT I-STAT, CHEM 8
Creatinine, Ser: 0.6 mg/dL (ref 0.50–1.35)
Hemoglobin: 11.9 g/dL — ABNORMAL LOW (ref 13.0–17.0)
Sodium: 140 mEq/L (ref 135–145)
TCO2: 30 mmol/L (ref 0–100)

## 2012-09-19 LAB — GLUCOSE, CAPILLARY: Glucose-Capillary: 132 mg/dL — ABNORMAL HIGH (ref 70–99)

## 2012-09-19 LAB — CBC
Hemoglobin: 11 g/dL — ABNORMAL LOW (ref 13.0–17.0)
RBC: 3.76 MIL/uL — ABNORMAL LOW (ref 4.22–5.81)

## 2012-09-19 LAB — PROTIME-INR
INR: 1.75 — ABNORMAL HIGH (ref 0.00–1.49)
Prothrombin Time: 19.8 seconds — ABNORMAL HIGH (ref 11.6–15.2)

## 2012-09-19 MED ORDER — WARFARIN SODIUM 7.5 MG PO TABS
7.5000 mg | ORAL_TABLET | Freq: Once | ORAL | Status: AC
  Administered 2012-09-19: 7.5 mg
  Filled 2012-09-19: qty 1

## 2012-09-19 NOTE — Progress Notes (Signed)
Seen and examined.  Feels good.  We are waiting to confirmation from neuro that it is OK to pull phoresis catheter.  Then physical therapy and assessment for rehab location/dispo planning

## 2012-09-19 NOTE — Progress Notes (Signed)
Patient ID: Victor Durham, male   DOB: 08/10/1955, 57 y.o.   MRN: [id number] Attempted to call Patient's father to keep him updated, as requested, and he was unavailable. Left message he could call the nurses station if he had further questions today. Will attempt again over the weekend.

## 2012-09-19 NOTE — Progress Notes (Signed)
ANTICOAGULATION CONSULT NOTE - Follow Up Consult  Pharmacy Consult for Coumadin / Vancomycin Indication: Recent DVT / MRSA bacteremia  No Known Allergies  Patient Measurements: Height: 6' (182.9 cm) Weight: 155 lb 9.6 oz (70.58 kg) IBW/kg (Calculated) : 77.6  Vital Signs: Temp: 97.2 F (36.2 C) (04/11 0516) Temp src: Oral (04/11 0516) BP: 92/48 mmHg (04/11 0524) Pulse Rate: 66 (04/11 0516)  Labs:  Recent Labs  09/17/12 0600 09/18/12 0512 09/18/12 1738 09/19/12 0500  HGB 11.2* 11.4* 11.9* 11.0*  HCT 33.0* 33.4* 35.0* 34.1*  PLT 192 202  --  210  LABPROT 22.7* 18.7*  --  19.8*  INR 2.10* 1.62*  --  1.75*  CREATININE 0.49* 0.45* 0.60 0.50    Assessment: 57 yo male on Coumadin for hx recent DVT (1/14). INR on admit was supratherapeutic (3.3). INR is treing up today. Dose was given late las PM Home Coumadin regimen: 7.5mg  TTSS, and 5mg  MWF.   Day # 120 Vancomycin, currently on 750 mg IV q8hr. Last Vancomycin trough level 13.6 mcg/ml on 09/16/12. Scr low stable; doesn't reflect true renal function. Vancomycin to continue for 4 weeks from 3/25 -> thru 09/30/12.  Goal of Therapy:  INR 2-3 Monitor platelets by anticoagulation protocol: Yes Vancomycin trough levels 15-20 mcg/ml  Plan:   Coumadin 7.5 mg x 1 today.  Continue daily PT/INR.   Continue vancomycin 750 mg IV q8hrs.  Weekly vancomycin trough and bmet for duration of course.  Next trough due 4/15.

## 2012-09-19 NOTE — Progress Notes (Signed)
Patient ID: Victor Durham, male   DOB: 1956-03-10, 57 y.o.   MRN: [id number] Daily Progress Note Family Medicine Teaching Service Service Pager: [phone]  Subjective:  Patient reports his breathing is "good" and give two thumbs up. He is wanting to get up and walk and he wants to "go home." He finished his last of 5 plasmapheresis treatments yesterday, but still has his cath in his leg.  We need this removed in order to get PT to work with him today if applicable.   Objective:  Temp:  [97.2 F (36.2 C)-98.4 F (36.9 C)] 97.2 F (36.2 C) (04/11 0516) Pulse Rate:  [63-79] 66 (04/11 0516) Resp:  [14-21] 20 (04/11 0516) BP: (79-109)/(45-61) 92/48 mmHg (04/11 0524) SpO2:  [95 %-100 %] 100 % (04/11 0516) FiO2 (%):  [28 %] 28 % (04/11 0357) Weight:  [150 lb 6.4 oz (68.221 kg)-155 lb 9.6 oz (70.58 kg)] 155 lb 9.6 oz (70.58 kg) (04/11 0516)  Intake/Output Summary (Last 24 hours) at 09/19/12 0852 Last data filed at 09/19/12 0300  Gross per 24 hour  Intake      0 ml  Output   2480 ml  Net  -2480 ml   Physical Exam: General: resting comfortably in bed. Cooperative through exams. Eyes: Left lid ptosis L >R Heart: RRR. No murmurs, rubs, or gallops.  Lungs: Normal WOB, mild crackles bilateral bases.  Abd: PEG in place with feeds. Site clean/dry/intact., S/NT/ND Back: Dressing in place.  Clean, dry, intact.   Extremities: RLE - dressing in place; clean, dry and intact.  Neuro: moves all extremities, moves all 4 appendages easily  Labs and imaging:  CBC  Recent Labs Lab 09/17/12 0600 09/18/12 0512 09/19/12 0500  WBC 6.5 6.0 5.5  HGB 11.2* 11.4* 11.0*  HCT 33.0* 33.4* 34.1*  PLT 192 202 210   BMET/CMET  Recent Labs Lab 09/17/12 0600 09/18/12 0512 09/19/12 0500  NA 142 137 139  K 3.6 3.2* 3.5  CL 105 101 106  CO2 31 32 29  BUN 17 15 15   CREATININE 0.49* 0.45* 0.50  CALCIUM 8.6 8.8 8.8  GLUCOSE 102* 97 96    Scheduled Medications: . antiseptic oral rinse  15 mL  Mouth Rinse QID  . aspirin  81 mg Per Tube Daily  . calcium gluconate  2 g Intravenous Once  . chlorhexidine  15 mL Mouth Rinse BID  . feeding supplement  30 mL Per Tube BID  . pantoprazole sodium  40 mg Per Tube Q1200  . predniSONE  40 mg Per Tube Q breakfast  . pyridostigmine  90 mg Per Tube 5 X Daily  . sodium chloride  3 mL Intravenous Q12H  . sterile water for irrigation  200 mL Irrigation Q4H  . vancomycin  750 mg Intravenous Q8H  . Warfarin - Pharmacist Dosing Inpatient   Does not apply q1800   Continuous Infusions: . citrate dextrose    . feeding supplement (JEVITY 1.2 CAL) 1,000 mL (09/18/12 1646)   Dg Swallowing Func-speech Pathology  09/17/2012  Victor Durham, CCC-SLP     09/17/2012 12:31 PM Objective Swallowing Evaluation: Modified Barium Swallowing Study   Patient Details  Name: Victor Durham MRN: [id number] Date of Birth: May 28, 1956  Today's Date: 09/17/2012 Time: 6962-9528 SLP Time Calculation (min): 25 min  Past Medical History:  Past Medical History  Diagnosis Date  . DVT (deep venous thrombosis)   . Hypertension   . High cholesterol   . Myasthenia gravis   .  Anxiety   . Dysphagia 06/30/12   Past Surgical History:  Past Surgical History  Procedure Laterality Date  . Esophagogastroduodenoscopy  07/11/2012    Procedure: ESOPHAGOGASTRODUODENOSCOPY (EGD);  Surgeon: Shirley Friar, MD;  Location: [facility];  Service: Endoscopy;   Laterality: N/A;  BEDSIDE  . Tracheostomy N/A   . Tee without cardioversion N/A 09/09/2012    Procedure: TRANSESOPHAGEAL ECHOCARDIOGRAM (TEE);  Surgeon:  Dolores Patty, MD;  Location: [facility] ENDOSCOPY;  Service:  Cardiovascular;  Laterality: N/A;   HPI:  57 y/o homeless male now at SNF, recnet dx during admission 1/30  with Myasthenia Gravis and aspiration pna. Mestinon,  plasmapheresis started 2/4. ETT 1/30-2/9. Trach placed 2/11, PEG  2/13. Pt evaluated on 2/14 for PMSV, by 2/26 prior to d/c pt was  tolerating during all waking hours with full  supervision moving  toward intermittent supervision. Pt d/c'd to SNF.  Readmitted  3/23 with RLE cellulitis and G+ bacteremia. Became obtunded on  3/26 and ABG reveled profound hypercarbia / acidosis. off the  vent on 3/29.  Pt. tolerating PMSV with supervision.  Has not  required noctornal vent past several nights.  Exhibits  intermittent wet vocal quality indicative of decreased management  of secretions, however he has not had an objective swallow  evaluation during prior admission, and a baseline MBS recommended  for today.        Assessment / Plan / Recommendation Clinical Impression  Dysphagia Diagnosis: Severe pharyngeal phase dysphagia;Mild  cervical esophageal phase dysphagia Clinical impression: MBS performed today with PMSV donned.  Oral  phase appreared functional for limited po presented.  Pharyngeal  phase impairments are severe and motor based with a mostly absent  pharyngeal contraction, significantly decreased laryngeal  elevation with partial epiglottic deflection.  Maximum vallecular  residue from reduced tongue based retraction with good sensation.   Approximatley 6 dry/additional swallows were unsuccessful in  decreasing residual.  Pt. refused to attempt honey thick barium  stating "I'm just not ready."  Recommend he continue NPO status  and initiate tongue base and pharyngeal strengthening exercises.       Treatment Recommendation  Therapy as outlined in treatment plan below    Diet Recommendation NPO   Medication Administration: Via alternative means    Other  Recommendations Oral Care Recommendations: Oral care BID   Follow Up Recommendations  Skilled Nursing facility    Frequency and Duration min 2x/week  2 weeks   Pertinent Vitals/Pain none    SLP Swallow Goals Goal #3: Pt. will demonstrate tongue base and pharyngeal  strengthening exercises given moderate verbal/visual/tactile  cues.      Reason for Referral Objectively evaluate swallowing function   Oral Phase Oral Preparation/Oral Phase  Oral Phase: WFL   Pharyngeal Phase Pharyngeal Phase Pharyngeal Phase: Impaired Pharyngeal - Solids Pharyngeal - Puree: Delayed swallow initiation;Premature spillage  to valleculae;Reduced pharyngeal peristalsis;Reduced epiglottic  inversion;Reduced anterior laryngeal mobility;Reduced laryngeal  elevation;Reduced airway/laryngeal closure;Reduced tongue base  retraction;Pharyngeal residue - valleculae;Pharyngeal residue -  pyriform sinuses Pharyngeal Phase - Comment Pharyngeal Comment:  (unable to effectively reduce residual)  Cervical Esophageal Phase    GO    Cervical Esophageal Phase Cervical Esophageal Phase: Impaired Cervical Esophageal Phase - Solids Puree:  (appeared to have tight UES)         Victor Durham M.Ed CCC-SLP Pager [phone]  09/17/2012      PRN Medications:sodium chloride, acetaminophen, acetaminophen, albuterol, calcium carbonate, calcium gluconate IVPB, calcium gluconate IVPB, calcium gluconate, diphenhydrAMINE, fentaNYL, ondansetron (  ZOFRAN) IV, ondansetron, sodium chloride, sodium chloride  Assessment  57 yo M with RLE cellulitis and multiple abscesses as well as MRSA bacteremia now with associated myasthenic crisis. Greatly improved over last week. Will need PT to work with him today. Has been able to maintain 28% trach collar for second night, although 5LNC has been needed. Speech evaluation on 4/9 recommended NPO.   Myasthenic Crisis - 28% trach collar with 5LNC - thought related to increased prednisone abruptly (given for stress dose) but possibly due to infection given crisis started <12 hours after admin high dose prednisone and typically 5-10 days after increased dose of prednisone, Likely exacerbated by MRSA bacteremia  - Neurology following and we greatly appreciate their help in managing this patient.  - Now s/p 5 day course of IV IG  - on high dose prednisone 50mg  (Tapering) in addition to pyridostigmine via neuro recommendations. - S/P plasma exchange on 4/2 , 4/4,  4/6 , 4/8 and 4/10; Need Neuro recommendations on removing catheter for plasmapheresis today, so that PT will work with him. - Pulmonology consulted- appreciate recs and management of vent; attempt to wean with trach trials as tolerated. 28% this morning.  MRSA bacteremia -likely source RLE cellulitis and multiple abscess. MRI tib/fib showed cellulitis, with no infiltration to muscle/bone.  - ID s/o, appreciate reccs -TEE resulted with 65% EF, trivial PR and AI. No endocarditis.  - 4 weeks IV vanc from 3/25 per ID, last day will be April 22nd; weekly vanc trough needed. - Vanc trough Goal 15-20. Next due 4/15  H/o DVT - Coumadin per pharmacy.   Respiratory failure since 4/2, trach dependent - Currently on/off vent. attmepting trach trials, defer management to pulmonology, again we appreciate their recs and management - Needs to be on 28% Trach in order for current placement to take him back.   Wounds - banadges clean/dry/inatc--> wounds are packed and are healing well, without drainage - R leg, Sacrum, R Back --> willreturn this afternoon to look at sacrum. - Nursing dressing wounds daily.   Hypokalemia - Potassium 3.5--> 3.1--> 3.3--> 3.5--> 3.6--> 3.2-->3.5 - replete per PEG, as needed  FEN/GI -  KVO, continue Peg feeds Ppx- coumadin per pharmacy. Dispo - pending continued clinical improvement. Patient does not desire to return to [facility], he would like to be in inpatient rehab or go home. Will re-contact social work to re-evaluate dispo situation after PT works with him.  Code: FULL code Contact today: Mead Slane, father: [phone]; Would like contacted every couple of days, last call 4/8. Will call today.

## 2012-09-19 NOTE — Care Management Note (Unsigned)
    Page 1 of 2   09/19/2012     4:05:21 PM   CARE MANAGEMENT NOTE 09/19/2012  Patient:  Victor Durham   Account Number:  [id number]  Date Initiated:  09/01/2012  Documentation initiated by:  Donn Pierini  Subjective/Objective Assessment:   Pt admitted with cellulitis and abscess     Action/Plan:   PTA pt was at [facility]- CSW following for return to SNF   Anticipated DC Date:  09/05/2012   Anticipated DC Plan:  SKILLED NURSING FACILITY  In-house referral  Clinical Social Worker      DC Planning Services  CM consult      Choice offered to / List presented to:             Status of service:  In process, will continue to follow Medicare Important Message given?   (If response is "NO", the following Medicare IM given date fields will be blank) Date Medicare IM given:   Date Additional Medicare IM given:    Discharge Disposition:    Per UR Regulation:  Reviewed for med. necessity/level of care/duration of stay  If discussed at Long Length of Stay Meetings, dates discussed:   09/11/2012  09/16/2012    Comments:  09/19/12 Victor Barnaby,RN,BSN [phone] PLASMAPHORESIS CATHETER OUT TODAY.  PER DR HENSEL, PT CAN DC AFTER P.T. CONSULT TO DETERMINE NEEDS AT DC.  WILL MOST LIKELY NEED TO RETURN TO SNF, AS PT HAS TRACH, AND HAS LIMITED SUPPORT.  PT IS ABLE TO DC BACK TO [facility] ON SATURDAY, IF CLEARED MEDICALLY, PER CSW REPORT.  09/16/12 4540 Victor Mayo RN BSN MSN CCM Now on 28% TC, plasmapheresis per neuro.  Unable to ambulate with PT 2/2 femoral cath.  CSW following, pt to return to [facility] SNF.  09/11/12 0956 Victor Prime RN BSN MSN CCM Pt back on vent.  CM referral received for IV antibx but plan is d/c to SNF.  CSW aware.

## 2012-09-19 NOTE — Clinical Social Work Note (Signed)
Clinical Social Worker continuing to follow patient for support and discharge planning needs.  Patient is from [facility] and per handoff from patient previous unit CSW, patient is agreeable to return.  CSW unable to confirm discharge plans with patient due to current bedside procedure.  Per MD, patient would like to return "home," therefore we are awaiting PT evaluation for recommendations.  Per report given, patient is homeless and does not have adequate housing - CSW unable to confirm at this time.  CSW spoke with Cheyenne Adas [phone]) who states that patient is able to return over the weekend if needed.  Await PT recommendations and will facilitate patient discharge needs.  CSW remains available for support and to facilitate patient discharge plans.  Macario Golds, [place] [phone]

## 2012-09-19 NOTE — Progress Notes (Signed)
Physical Therapy Re-Evaluation Patient Details Name: Victor Durham MRN: [id number] DOB: January 11, 1956 Today's Date: 09/19/2012 Time: 4098-1191 (With 15 minute interruption) PT Time Calculation (min): 40 min  PT Assessment / Plan / Recommendation Clinical Impression  Patient is a 57 yo male recently discharged from [facility] to SNF.  Now returns with RLE cellulitis and MG exacerbation - s/p plasmaphoresis.  Patient able to ambulate with assist with RW for short distances.  Will benefit from acute PT to maximize independence prior to discharge.  Recommend return to SNF for continuation of therapy.    PT Assessment  Patient needs continued PT services    Follow Up Recommendations  SNF    Does the patient have the potential to tolerate intense rehabilitation      Barriers to Discharge Decreased caregiver support      Equipment Recommendations  Rolling walker with 5" wheels    Recommendations for Other Services     Frequency Min 2X/week    Precautions / Restrictions Precautions Precautions: Fall Precaution Comments: Patient with PEG and Trach Restrictions Weight Bearing Restrictions: No   Pertinent Vitals/Pain       Mobility  Bed Mobility Bed Mobility: Supine to Sit;Sitting - Scoot to Edge of Bed;Sit to Supine Supine to Sit: 4: Min assist;With rails Sitting - Scoot to Edge of Bed: 4: Min guard;With rail Sit to Supine: 4: Min assist;With rail Details for Bed Mobility Assistance: Verbal cues for technique.  Min assist due to general weakness. Transfers Transfers: Sit to Stand;Stand to Sit Sit to Stand: 4: Min assist;With upper extremity assist;From bed Stand to Sit: 4: Min assist;With upper extremity assist;To bed Details for Transfer Assistance: Verbal cues for hand placement and safety.  Assist to rise to standing and for balance. Ambulation/Gait Ambulation/Gait Assistance: 4: Min assist Ambulation Distance (Feet): 76 Feet Assistive device: Rolling  walker Ambulation/Gait Assistance Details: Verbal cues for safe use of RW, particularly in turns.  Verbal cues to stand upright and remain close to RW.  Patient with flexed posture and short shuffle steps. Gait Pattern: Step-through pattern;Decreased step length - right;Decreased step length - left;Shuffle;Trunk flexed Gait velocity: Slow gait speed    Exercises     PT Diagnosis: Difficulty walking;Abnormality of gait;Generalized weakness;Altered mental status  PT Problem List: Decreased strength;Decreased activity tolerance;Decreased balance;Decreased mobility;Decreased cognition;Decreased knowledge of use of DME;Decreased safety awareness;Cardiopulmonary status limiting activity PT Treatment Interventions: DME instruction;Gait training;Functional mobility training;Balance training;Cognitive remediation;Patient/family education   PT Goals Acute Rehab PT Goals PT Goal Formulation: With patient Time For Goal Achievement: 09/26/12 Potential to Achieve Goals: Good Pt will go Supine/Side to Sit: with supervision PT Goal: Supine/Side to Sit - Progress: Goal set today Pt will go Sit to Supine/Side: with supervision PT Goal: Sit to Supine/Side - Progress: Goal set today Pt will go Sit to Stand: with supervision PT Goal: Sit to Stand - Progress: Goal set today Pt will go Stand to Sit: with supervision PT Goal: Stand to Sit - Progress: Goal set today Pt will Ambulate: 51 - 150 feet;with supervision;with rolling walker PT Goal: Ambulate - Progress: Goal set today  Visit Information  Last PT Received On: 09/19/12 Assistance Needed: +1 Reason Eval/Treat Not Completed: Medical issues which prohibited therapy;Other (comment) (Pt still had femoral line in at 1330. )    Subjective Data  Subjective: Patient reports he is supposed to be on bedrest until tomorrow.  Contacted IV team - confirmed bedrest was for 30 minutes after femoral catheter was pulled.  Re-educated patient who was  then comfortable  with getting OOB. Patient Stated Goal: To eventually go home   Prior Functioning  Home Living Lives With: Other (Comment) (At SNF prior to admission) Available Help at Discharge: Skilled Nursing Facility Prior Function Level of Independence: Needs assistance Needs Assistance: Gait Gait Assistance: Was working with physical therapy on mobility and gait with RW pta. Driving: No Vocation: On disability Communication Communication: Tracheostomy;Passy-Muir valve Dominant Hand: Right    Cognition  Cognition Overall Cognitive Status: Impaired Area of Impairment: Memory;Safety/judgement Arousal/Alertness: Awake/alert Orientation Level: Oriented X4 / Intact Behavior During Session: Anxious Memory Deficits: Difficulty remembering information given to him (i.e.  bedrest time following catheter removal; discharge plans) Safety/Judgement: Decreased safety judgement for tasks assessed Safety/Judgement - Other Comments: Moves before preparations are made.    Extremity/Trunk Assessment Right Upper Extremity Assessment RUE ROM/Strength/Tone: Deficits RUE ROM/Strength/Tone Deficits: General weakness of 3/5 RUE Sensation: WFL - Light Touch Left Upper Extremity Assessment LUE ROM/Strength/Tone: Deficits LUE ROM/Strength/Tone Deficits: General weakness of 3/5 LUE Sensation: WFL - Light Touch Right Lower Extremity Assessment RLE ROM/Strength/Tone: Deficits RLE ROM/Strength/Tone Deficits: General weakness 4-/5 RLE Sensation: WFL - Light Touch Left Lower Extremity Assessment LLE ROM/Strength/Tone: Deficits LLE ROM/Strength/Tone Deficits: General weakness 4-/5 LLE Sensation: WFL - Light Touch   Balance Balance Balance Assessed: Yes Static Sitting Balance Static Sitting - Balance Support: No upper extremity supported;Feet supported Static Sitting - Level of Assistance: 5: Stand by assistance Static Sitting - Comment/# of Minutes: 3 Static Standing Balance Static Standing - Balance Support:  Bilateral upper extremity supported Static Standing - Level of Assistance: 4: Min assist Static Standing - Comment/# of Minutes: 2 minutes.  Required use of RW with UE support to maintain balance.   End of Session PT - End of Session Equipment Utilized During Treatment: Gait belt Activity Tolerance: Patient limited by fatigue Patient left: in bed;with call bell/phone within reach Nurse Communication: Mobility status  GP     Vena Austria 09/19/2012, 5:11 PM Durenda Hurt. Renaldo Fiddler, [facility] Acute Rehab Services Pager [phone]

## 2012-09-19 NOTE — Progress Notes (Signed)
Subjective: Patient has now completed full course of plasmaphoresis.  Catheter removed.  Patient transferred out of the ICU.    Objective: Current vital signs: BP 92/48  Pulse 66  Temp(Src) 97.2 F (36.2 C) (Oral)  Resp 20  Ht 6' (1.829 m)  Wt 70.58 kg (155 lb 9.6 oz)  BMI 21.1 kg/m2  SpO2 100% Vital signs in last 24 hours: Temp:  [97.2 F (36.2 C)-98.4 F (36.9 C)] 97.2 F (36.2 C) (04/11 0516) Pulse Rate:  [63-72] 66 (04/11 0516) Resp:  [14-21] 20 (04/11 0516) BP: (79-109)/(45-61) 92/48 mmHg (04/11 0524) SpO2:  [95 %-100 %] 100 % (04/11 0516) FiO2 (%):  [28 %] 28 % (04/11 1500) Weight:  [68.221 kg (150 lb 6.4 oz)-70.58 kg (155 lb 9.6 oz)] 70.58 kg (155 lb 9.6 oz) (04/11 0516)  Intake/Output from previous day: 04/10 0701 - 04/11 0700 In: 2047.5 [I.V.:607.5; NG/GT:600] Out: 2480 [Urine:2480] Intake/Output this shift: Total I/O In: 0  Out: 850 [Urine:850] Nutritional status:    Neurologic Exam: Mental Status:  Voice strong.  Speech fluent without evidence of aphasia.  Follows commands.  Cranial Nerves:  II: Discs flat bilaterally; Visual fields grossly normal, pupils equal, round, reactive to light and accommodation  III,IV, VI: left ptosis, extra-ocular motions intact bilaterally  V,VII: smile symmetric, facial light touch sensation normal bilaterally  VIII: hearing normal bilaterally  IX,X: gag reflex present  XI: bilateral shoulder shrug  XII: midline tongue extension  Motor:  Able to lift all extremities off the bed symetrically  Sensory: Pinprick and light touch intact throughout, bilaterally  Plantars:  Right: mute    Left: mute   Lab Results: Basic Metabolic Panel:  Recent Labs Lab 09/15/12 0515 09/16/12 0530 09/16/12 1426 09/17/12 0600 09/18/12 0512 09/18/12 1738 09/19/12 0500  NA 139 139 141 142 137 140 139  K 3.3* 3.5 4.2 3.6 3.2* 3.8 3.5  CL 103 102 103 105 101 102 106  CO2 32 33*  --  31 32  --  29  GLUCOSE 112* 105* 131* 102* 97 96 96   BUN 16 17 16 17 15 12 15   CREATININE 0.47* 0.44* 0.70 0.49* 0.45* 0.60 0.50  CALCIUM 8.8 8.7  --  8.6 8.8  --  8.8    Liver Function Tests: No results found for this basename: AST, ALT, ALKPHOS, BILITOT, PROT, ALBUMIN,  in the last 168 hours No results found for this basename: LIPASE, AMYLASE,  in the last 168 hours No results found for this basename: AMMONIA,  in the last 168 hours  CBC:  Recent Labs Lab 09/15/12 0515 09/16/12 0530 09/16/12 1426 09/17/12 0600 09/18/12 0512 09/18/12 1738 09/19/12 0500  WBC 7.3 7.0  --  6.5 6.0  --  5.5  HGB 11.1* 11.2* 11.9* 11.2* 11.4* 11.9* 11.0*  HCT 32.3* 33.7* 35.0* 33.0* 33.4* 35.0* 34.1*  MCV 87.8 89.4  --  89.2 89.8  --  90.7  PLT 199 216  --  192 202  --  210    Cardiac Enzymes: No results found for this basename: CKTOTAL, CKMB, CKMBINDEX, TROPONINI,  in the last 168 hours  Lipid Panel: No results found for this basename: CHOL, TRIG, HDL, CHOLHDL, VLDL, LDLCALC,  in the last 168 hours  CBG:  Recent Labs Lab 09/18/12 0007 09/18/12 0813 09/18/12 1625 09/19/12 0001 09/19/12 0801  GLUCAP 81 109* 96 107* 115*    Microbiology: Results for orders placed during the hospital encounter of 08/31/12  CULTURE, BLOOD (ROUTINE X 2)  Status: None   Collection Time    08/31/12 11:07 AM      Result Value Range Status   Specimen Description BLOOD RIGHT ARM   Final   Special Requests BOTTLES DRAWN AEROBIC AND ANAEROBIC 10CC   Final   Culture  Setup Time 08/31/2012 20:34   Final   Culture     Final   Value: STAPHYLOCOCCUS AUREUS     Note: SUSCEPTIBILITIES PERFORMED ON PREVIOUS CULTURE WITHIN THE LAST 5 DAYS.     Note: Gram Stain Report Called to,Read Back By and Verified With: PAULINE COLQUHOUN 09/01/12 0830 BY SMITHERSJ   Report Status 09/03/2012 FINAL   Final  CULTURE, BLOOD (ROUTINE X 2)     Status: None   Collection Time    08/31/12 11:14 AM      Result Value Range Status   Specimen Description BLOOD RIGHT HAND   Final    Special Requests BOTTLES DRAWN AEROBIC AND ANAEROBIC [facility]   Final   Culture  Setup Time 08/31/2012 20:34   Final   Culture     Final   Value: METHICILLIN RESISTANT STAPHYLOCOCCUS AUREUS     Note: RIFAMPIN AND GENTAMICIN SHOULD NOT BE USED AS SINGLE DRUGS FOR TREATMENT OF STAPH INFECTIONS. CRITICAL RESULT CALLED TO, READ BACK BY AND VERIFIED WITH: LISA JOHNSON @ 1352 09/02/12 BY KRAWS This organism DOES NOT demonstrate inducible Clindamycin      resistance in vitro.     Note: Gram Stain Report Called to,Read Back By and Verified With: PAULINE COLQUHOUN 09/01/12 0830 BY SMITHERSJ   Report Status 09/03/2012 FINAL   Final   Organism ID, Bacteria METHICILLIN RESISTANT STAPHYLOCOCCUS AUREUS   Final  CULTURE, ROUTINE-ABSCESS     Status: None   Collection Time    08/31/12 12:07 PM      Result Value Range Status   Specimen Description ABSCESS RIGHT LOWER LEG   Final   Special Requests NONE   Final   Gram Stain     Final   Value: RARE WBC PRESENT,BOTH PMN AND MONONUCLEAR     NO SQUAMOUS EPITHELIAL CELLS SEEN     FEW GRAM POSITIVE COCCI     IN PAIRS IN CLUSTERS   Culture     Final   Value: MODERATE METHICILLIN RESISTANT STAPHYLOCOCCUS AUREUS     Note: RIFAMPIN AND GENTAMICIN SHOULD NOT BE USED AS SINGLE DRUGS FOR TREATMENT OF STAPH INFECTIONS. This organism DOES NOT demonstrate inducible Clindamycin resistance in vitro. CRITICAL RESULT CALLED TO, READ BACK BY AND VERIFIED WITH: GENEVIEVE RN BY      INGRAM A 3/26 840AM   Report Status 09/03/2012 FINAL   Final   Organism ID, Bacteria METHICILLIN RESISTANT STAPHYLOCOCCUS AUREUS   Final  MRSA PCR SCREENING     Status: None   Collection Time    08/31/12  5:22 PM      Result Value Range Status   MRSA by PCR NEGATIVE  NEGATIVE Final   Comment:            The GeneXpert MRSA Assay (FDA     approved for NASAL specimens     only), is one component of a     comprehensive MRSA colonization     surveillance program. It is not     intended to diagnose MRSA      infection nor to guide or     monitor treatment for     MRSA infections.  CULTURE, BLOOD (SINGLE)     Status: None  Collection Time    09/02/12  4:48 PM      Result Value Range Status   Specimen Description BLOOD RIGHT ANTECUBITAL   Final   Special Requests BOTTLES DRAWN AEROBIC ONLY 5CC   Final   Culture  Setup Time 09/02/2012 22:23   Final   Culture NO GROWTH 5 DAYS   Final   Report Status 09/08/2012 FINAL   Final  CULTURE, BLOOD (ROUTINE X 2)     Status: None   Collection Time    09/03/12  4:30 AM      Result Value Range Status   Specimen Description BLOOD LEFT ARM   Final   Special Requests BOTTLES DRAWN AEROBIC AND ANAEROBIC 10CC    Final   Culture  Setup Time 09/03/2012 08:52   Final   Culture NO GROWTH 5 DAYS   Final   Report Status 09/09/2012 FINAL   Final  CULTURE, BLOOD (ROUTINE X 2)     Status: None   Collection Time    09/03/12  4:40 AM      Result Value Range Status   Specimen Description BLOOD LEFT HAND   Final   Special Requests BOTTLES DRAWN AEROBIC AND ANAEROBIC 10CC   Final   Culture  Setup Time 09/03/2012 08:52   Final   Culture NO GROWTH 5 DAYS   Final   Report Status 09/09/2012 FINAL   Final  URINE CULTURE     Status: None   Collection Time    09/04/12  9:02 PM      Result Value Range Status   Specimen Description URINE, CATHETERIZED   Final   Special Requests NONE   Final   Culture  Setup Time 09/05/2012 03:03   Final   Colony Count NO GROWTH   Final   Culture NO GROWTH   Final   Report Status 09/06/2012 FINAL   Final    Coagulation Studies:  Recent Labs  09/17/12 0600 09/18/12 0512 09/19/12 0500  LABPROT 22.7* 18.7* 19.8*  INR 2.10* 1.62* 1.75*    Imaging: No results found.  Medications:  I have reviewed the patient's current medications. Scheduled: . antiseptic oral rinse  15 mL Mouth Rinse QID  . aspirin  81 mg Per Tube Daily  . calcium gluconate  2 g Intravenous Once  . chlorhexidine  15 mL Mouth Rinse BID  . feeding  supplement  30 mL Per Tube BID  . pantoprazole sodium  40 mg Per Tube Q1200  . predniSONE  40 mg Per Tube Q breakfast  . pyridostigmine  90 mg Per Tube 5 X Daily  . sodium chloride  3 mL Intravenous Q12H  . sterile water for irrigation  200 mL Irrigation Q4H  . vancomycin  750 mg Intravenous Q8H  . warfarin  7.5 mg Per Tube ONCE-1800  . Warfarin - Pharmacist Dosing Inpatient   Does not apply q1800    Assessment/Plan: MG exacerbation improved s/p plasmaphoresis and adjustment of medications.    Recommendations: 1.  Continue Prednisone and Mestinon at current doses 2.  PT/OT, speech therapy   LOS: 19 days   Thana Farr, MD [facility] [phone] 09/19/2012  4:33 PM

## 2012-09-19 NOTE — Progress Notes (Signed)
PT Cancellation Note  Patient Details Name: FALCON MCCASKEY MRN: [id number] DOB: [birth year]   Cancelled Treatment:    Reason Eval/Treat Not Completed: Medical issues which prohibited therapy;Other (comment) (Pt still had femoral line in at 1330. )  Nursing asked [place] to come back in am.  At 1450, received call from [place] with CM that line had been pulled and pt would be able to work with PT at 1530.  Called Beaulah Corin, PT who can return at 1530 to see pt.  Thanks.   INGOLD,Takeya Marquis 09/19/2012, 2:57 PM [facility] Acute Rehabilitation [phone] (pager)

## 2012-09-19 NOTE — Progress Notes (Signed)
Speech Language Pathology Dysphagia Treatment Patient Details Name: Victor Durham MRN: [id number] DOB: [birth year] Today's Date: 09/19/2012 Time: 1500-1520 SLP Time Calculation (min): 20 min  Assessment / Plan / Recommendation Clinical Impression  Dysphagia treatment for pharyngeal/tongue base strengthening with PMSV donned.  Pt. required moderate verbal/visual cues for laryngeal elevation exercise (high pitched /e/), tongue base retraction ( glottal sounds /k/ /g/), vocal cord adduction (resistance with hard /hut/), and effortful swallow.  He consumed 2 small ice chips seperately to facilitate strength of swallow.  SLP worote exercises for pt. and encouraged him to practice 3 times a day.  SLP will continue to follow.    Diet Recommendation       SLP Plan Continue with current plan of care   Pertinent Vitals/Pain none   Swallowing Goals  SLP Swallowing Goals Goal #3: Pt. will demonstrate tongue base and pharyngeal strengthening exercises given moderate verbal/visual/tactile cues. Swallow Study Goal #3 - Progress: Progressing toward goal  General Temperature Spikes Noted: No Respiratory Status: Trach Behavior/Cognition: Alert;Cooperative;Pleasant mood Oral Cavity - Dentition: Adequate natural dentition Patient Positioning: Upright in bed  Oral Cavity - Oral Hygiene Brush patient's teeth BID with toothbrush (using toothpaste with fluoride): Yes Patient is HIGH RISK - Oral Care Protocol followed (see row info): Yes   Dysphagia Treatment Treatment focused on:  (pharyngeal strengthening exercises) Patient observed directly with PO's: Yes Type of PO's observed: Ice chips Liquids provided via: Teaspoon Pharyngeal Phase Signs & Symptoms: Suspected delayed swallow initiation Type of cueing: Verbal;Visual Amount of cueing: Moderate   GO     [name] [employer] [phone]  09/19/2012

## 2012-09-20 LAB — BASIC METABOLIC PANEL
BUN: 15 mg/dL (ref 6–23)
CO2: 30 mEq/L (ref 19–32)
Calcium: 8.8 mg/dL (ref 8.4–10.5)
Chloride: 102 mEq/L (ref 96–112)
Creatinine, Ser: 0.45 mg/dL — ABNORMAL LOW (ref 0.50–1.35)
Glucose, Bld: 102 mg/dL — ABNORMAL HIGH (ref 70–99)

## 2012-09-20 LAB — GLUCOSE, CAPILLARY: Glucose-Capillary: 112 mg/dL — ABNORMAL HIGH (ref 70–99)

## 2012-09-20 LAB — CBC
HCT: 31.9 % — ABNORMAL LOW (ref 39.0–52.0)
Hemoglobin: 10.9 g/dL — ABNORMAL LOW (ref 13.0–17.0)
MCH: 30.8 pg (ref 26.0–34.0)
MCV: 90.1 fL (ref 78.0–100.0)
RBC: 3.54 MIL/uL — ABNORMAL LOW (ref 4.22–5.81)
WBC: 6.5 10*3/uL (ref 4.0–10.5)

## 2012-09-20 LAB — PROTIME-INR: INR: 1.16 (ref 0.00–1.49)

## 2012-09-20 MED ORDER — WARFARIN SODIUM 10 MG PO TABS
10.0000 mg | ORAL_TABLET | Freq: Once | ORAL | Status: DC
  Filled 2012-09-20: qty 1

## 2012-09-20 MED ORDER — VANCOMYCIN HCL 1000 MG IV SOLR: 750.0000 mg | Freq: Three times a day (TID) | INTRAVENOUS | Status: DC

## 2012-09-20 MED ORDER — PREDNISONE 20 MG PO TABS: 40.0000 mg | ORAL_TABLET | Freq: Every day | ORAL | Status: DC

## 2012-09-20 MED ORDER — POTASSIUM CHLORIDE 20 MEQ/15ML (10%) PO LIQD
20.0000 meq | Freq: Once | ORAL | Status: AC
  Administered 2012-09-20: 20 meq
  Filled 2012-09-20: qty 15

## 2012-09-20 MED ORDER — HEPARIN SOD (PORK) LOCK FLUSH 100 UNIT/ML IV SOLN
250.0000 [IU] | INTRAVENOUS | Status: AC | PRN
  Administered 2012-09-20: 250 [IU]

## 2012-09-20 NOTE — Progress Notes (Addendum)
Report given to Lanora Manis, RN at [facility] facility.  Patient aware of transfer and was discharged at 1600. Pt discharged with trach with oxygen and PICC for IV abx.

## 2012-09-20 NOTE — Progress Notes (Signed)
ANTICOAGULATION CONSULT NOTE - Follow Up Consult  Pharmacy Consult for Coumadin / Vancomycin Indication: Recent DVT / MRSA bacteremia  No Known Allergies  Patient Measurements: Height: 6' (182.9 cm) Weight: 153 lb 8 oz (69.627 kg) IBW/kg (Calculated) : 77.6  Vital Signs: Temp: 98 F (36.7 C) (04/12 0450) Temp src: Oral (04/12 0450) BP: 91/50 mmHg (04/12 0450) Pulse Rate: 62 (04/12 0731)  Labs:  Recent Labs  09/18/12 0512 09/18/12 1738 09/19/12 0500 09/20/12 0500 09/20/12 1216  HGB 11.4* 11.9* 11.0* 10.9*  --   HCT 33.4* 35.0* 34.1* 31.9*  --   PLT 202  --  210 193  --   LABPROT 18.7*  --  19.8* 15.4* 14.6  INR 1.62*  --  1.75* 1.24 1.16  CREATININE 0.45* 0.60 0.50 0.45*  --     Assessment: 57 yo male on Coumadin for hx recent DVT (1/14). INR on admit was supratherapeutic (3.3). INR was trending up yesterday but really down today for some reason. Dose was charted last PM Home Coumadin regimen: 7.5mg  TTSS, and 5mg  MWF.   Currently on Vanc 750 mg IV q8hr. Last Vancomycin trough level 13.6 mcg/ml on 09/16/12. Scr low stable; doesn't reflect true renal function. Vancomycin to continue for 4 weeks from 3/25 -> thru 09/30/12.  Goal of Therapy:  INR 2-3 Monitor platelets by anticoagulation protocol: Yes Vancomycin trough levels 15-20 mcg/ml  Plan:   Coumadin 10mg  PO x1  Continue daily PT/INR.   Continue vancomycin 750 mg IV q8hrs.  Weekly vancomycin trough and bmet for duration of course.  Next trough due 4/15.

## 2012-09-20 NOTE — Progress Notes (Signed)
Seen and examined.  Medically stable.  Plan is to DC to SNF when bed available.  SNF is felt to be short term for rehab.

## 2012-09-20 NOTE — Clinical Social Work Note (Signed)
Patient to return to [facility] today via [facility]. Patient, patient's father, and facility aware of transfer. Discharge packet complete. CSW signing off.  Ricke Hey, [place] [phone] (weekend)

## 2012-09-20 NOTE — Progress Notes (Signed)
Patient ID: Victor Durham, male   DOB: 05-17-1956, 57 y.o.   MRN: [id number] Daily Progress Note Family Medicine Teaching Service Service Pager: [phone]  Subjective:  Patient reports his breathing is "good" and give two thumbs up. UP some with PT yesterday. PT recommends SNF, he is open to returning to SNF for Rehab.   Objective:  Temp:  [98 F (36.7 C)-98.2 F (36.8 C)] 98 F (36.7 C) (04/12 0450) Pulse Rate:  [62-70] 62 (04/12 0731) Resp:  [18-19] 18 (04/12 0731) BP: (91-96)/(50) 91/50 mmHg (04/12 0450) SpO2:  [99 %-100 %] 99 % (04/12 0731) FiO2 (%):  [28 %] 28 % (04/12 0731) Weight:  [153 lb 8 oz (69.627 kg)] 153 lb 8 oz (69.627 kg) (04/12 0450)  Intake/Output Summary (Last 24 hours) at 09/20/12 0817 Last data filed at 09/20/12 0741  Gross per 24 hour  Intake    480 ml  Output   2900 ml  Net  -2420 ml   Physical Exam: General: resting comfortably in bed. Cooperative through exams. Eyes: Left lid ptosis L >R Heart: RRR. No murmurs, rubs, or gallops.  Lungs: Normal WOB, mild crackles bilateral bases.  Abd: PEG in place with feeds. Site clean/dry/intact., S/NT/ND Back: Dressing in place.  Clean, dry, intact.   Extremities: RLE - dressing in place; clean, dry and intact.  Neuro: moves all extremities, moves all 4 appendages easily  Labs and imaging:  CBC  Recent Labs Lab 09/18/12 0512 09/18/12 1738 09/19/12 0500 09/20/12 0500  WBC 6.0  --  5.5 6.5  HGB 11.4* 11.9* 11.0* 10.9*  HCT 33.4* 35.0* 34.1* 31.9*  PLT 202  --  210 193   BMET/CMET  Recent Labs Lab 09/18/12 0512 09/18/12 1738 09/19/12 0500 09/20/12 0500  NA 137 140 139 137  K 3.2* 3.8 3.5 3.4*  CL 101 102 106 102  CO2 32  --  29 30  BUN 15 12 15 15   CREATININE 0.45* 0.60 0.50 0.45*  CALCIUM 8.8  --  8.8 8.8  GLUCOSE 97 96 96 102*    Scheduled Medications: . antiseptic oral rinse  15 mL Mouth Rinse QID  . aspirin  81 mg Per Tube Daily  . calcium gluconate  2 g Intravenous Once  .  chlorhexidine  15 mL Mouth Rinse BID  . feeding supplement  30 mL Per Tube BID  . pantoprazole sodium  40 mg Per Tube Q1200  . predniSONE  40 mg Per Tube Q breakfast  . pyridostigmine  90 mg Per Tube 5 X Daily  . sodium chloride  3 mL Intravenous Q12H  . sterile water for irrigation  200 mL Irrigation Q4H  . vancomycin  750 mg Intravenous Q8H  . Warfarin - Pharmacist Dosing Inpatient   Does not apply q1800   Continuous Infusions: . citrate dextrose    . feeding supplement (JEVITY 1.2 CAL) 1,000 mL (09/19/12 1033)   No results found.  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 Greatly improved over last week. Will need PT to work with him today. Has been able to maintain 28% trach collar for second night, although 5LNC has been needed. Speech evaluation on 4/9 recommended NPO.   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  - Now s/p 5 day course of IV IG  - on high dose prednisone 50mg  (Tapering) in addition to pyridostigmine via neuro recommendations. - Completed course of plasmapharesis, catheter removed.  - Pulmonology consulted- appreciate recs and management of vent; attempt to wean with trach trials as tolerated. 28% this morning.  MRSA bacteremia -likely source RLE cellulitis and multiple  abscess. MRI tib/fib showed cellulitis, with no infiltration to muscle/bone.  - ID s/o, appreciate reccs -TEE resulted with 65% EF, trivial PR and AI. No endocarditis.  - 4 weeks IV vanc from 3/25 per ID, last day will be April 22nd; weekly vanc trough needed. - Vanc trough Goal 15-20. Next due 4/15  H/o DVT - Coumadin per pharmacy.   Respiratory failure since 4/2, trach dependent - Currently on/off vent. attmepting trach trials, defer management to pulmonology, again we appreciate their recs and management - Needs to be on 28% Trach in order for current placement to take him back.   Wounds - banadges clean/dry/inatc--> wounds are packed and are healing well, without drainage - R leg, Sacrum, R Back --> willreturn this afternoon to look at sacrum. - Nursing dressing wounds daily.   Hypokalemia - Potassium 3.5--> 3.1--> 3.3--> 3.5--> 3.6--> 3.2-->3.5-->3.4 - replete per PEG, as needed  FEN/GI -  KVO, continue Peg feeds Ppx- coumadin per pharmacy. Dispo - pending continued clinical improvement. . PT recommends SNF, and he says he would be open to going back to [facility] but would like to see what other options are available.  Code: FULL code Contact tomorrow: Michaelangelo Mittelman, father: [phone]; Would like contacted every couple of days, last call 4/11 Will call tomorrow.

## 2012-09-23 ENCOUNTER — Non-Acute Institutional Stay (SKILLED_NURSING_FACILITY): Payer: Self-pay | Admitting: Internal Medicine

## 2012-09-23 DIAGNOSIS — A419 Sepsis, unspecified organism: Secondary | ICD-10-CM

## 2012-09-23 DIAGNOSIS — A4902 Methicillin resistant Staphylococcus aureus infection, unspecified site: Secondary | ICD-10-CM

## 2012-09-23 DIAGNOSIS — G7000 Myasthenia gravis without (acute) exacerbation: Secondary | ICD-10-CM

## 2012-09-24 ENCOUNTER — Ambulatory Visit (INDEPENDENT_AMBULATORY_CARE_PROVIDER_SITE_OTHER): Payer: Self-pay | Admitting: Infectious Diseases

## 2012-09-24 ENCOUNTER — Encounter: Payer: Self-pay | Admitting: Infectious Diseases

## 2012-09-24 VITALS — BP 105/66 | HR 65 | Temp 98.1°F | Ht 74.0 in | Wt 153.0 lb

## 2012-09-24 DIAGNOSIS — L02419 Cutaneous abscess of limb, unspecified: Secondary | ICD-10-CM

## 2012-09-24 DIAGNOSIS — L03119 Cellulitis of unspecified part of limb: Secondary | ICD-10-CM

## 2012-09-24 DIAGNOSIS — R7881 Bacteremia: Secondary | ICD-10-CM

## 2012-09-24 DIAGNOSIS — A4902 Methicillin resistant Staphylococcus aureus infection, unspecified site: Secondary | ICD-10-CM

## 2012-09-24 NOTE — Assessment & Plan Note (Signed)
Will recheck BCx. Plan for [facility] and anbx to d/c on 09-29-12 as previously written. He may return to clinic prn.

## 2012-09-24 NOTE — Assessment & Plan Note (Signed)
I have some concerns about his wound. Will have him f/u at wound care center. He states that he does not have a pcp. Will see him back prn.

## 2012-09-24 NOTE — Progress Notes (Signed)
  Subjective:    Patient ID: Victor Durham, male    DOB: 1956/05/09, 57 y.o.   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 He had an MRI of his leg which did not show osteomyelitis. He was started on vancomycin on admission and found to have BCx 3/3 positive for MRSA on 08/31/2012. He had repeat cultures done on 3/25 and 3/26 that were negative. He had TTE and TEE that were negative for endocarditis. His course was also complicated by hypercarbic respiratory failure, he required ventilation for 3 days. He was able to be d/c to SNF on 4-12 with a plan for 28 days of vancomycin from negative BCx (end date 4-21).   No problems with PIC line. No f/c. Wound on leg has improved, not completely closed yet.  No problems with anbx- no loose BM, rash ect.  Review of Systems See HPI. Has been breathing well since off vent. No problems with PEG tube.     Objective:   Physical Exam  Constitutional: He appears well-developed. No distress.  HENT:  Mouth/Throat: Abnormal dentition. No oropharyngeal exudate.  Eyes: EOM are normal. Pupils are equal, round, and reactive to light.  Neck:    Cardiovascular: Normal rate, regular rhythm and normal heart sounds.   Pulmonary/Chest: Effort normal and breath sounds normal.  Abdominal: Soft. Bowel sounds are normal. There is no tenderness.    Musculoskeletal:       Arms:      Legs:         Assessment & Plan:

## 2012-09-26 NOTE — Discharge Summary (Signed)
Family Medicine Teaching Service  Discharge Note : Attending Sara Neal MD Pager [phone] Office [phone] I have seen and examined this patient, reviewed their chart and discussed discharge planning wit the resident at the time of discharge. I agree with the discharge plan as above.  

## 2012-09-30 LAB — CULTURE, BLOOD (SINGLE): Organism ID, Bacteria: NO GROWTH

## 2012-09-30 NOTE — Progress Notes (Shared)
Patient ID: Victor Durham, male   DOB: 1956-04-21, 57 y.o.   MRN: [id number]          HISTORY & PHYSICAL  DATE:  09/23/2012  FACILITY: [facility]   LEVEL OF CARE:   SNF   CHIEF COMPLAINT:  Readmission to the facility to SNF, status post stay at [facility] 08/31/2012 through 09/20/2012.   HISTORY OF PRESENT ILLNESS:  This is a patient whom we initially admitted to the building in late February, this gentleman who has myasthenia gravis.  This predominantly affected his swallowing musculature and also control of his neck musculature.  He is also trach dependent and PEG dependent.  He was making progress, as I understand.  He  apparently developed an area of erythema and a developing abscess on the posterior right leg.  He was sent to hospital with shortness of breath.  He was discovered to have a right lower extremity cellulitis and abscess.  This cultured MRSA, as well.  He developed MRSA bacteremia.  He had a TEE to assess for underlying endocarditis, although I do not see these results.  Three days into this admission, he developed respiratory distress and was transferred to the ICU for ventilator support.   Neurology saw the patient and he was started on IVIG and he completed a five-day course of this.  Respiratory status improved following continued treatment of bacteremia and IVIG.  He was transferred out of the ICU on 09/06/2012 on prednisone 40 mg daily and pyridostigmine 90 daily.    PAST MEDICAL HISTORY/PROBLEM LIST:  Recent admission to the hospital with MRSA sepsis, felt to be secondary to an abscess on his posterior right leg as well as his back.      Myasthenia gravis with myasthenia crisis.    History of DVT, on Coumadin.    Tracheostomy and PEG tube dependent.    CURRENT MEDICATIONS:  Medication list is reviewed.    Prednisone 40 mg daily.   Pyridostigmine 90 mg (7.5 mL of 60mg /51mL).     SOCIAL HISTORY:  I do not really have any information about his status prior to  his original admission here.  I will need to look into this.    REVIEW OF SYSTEMS:   GENERAL:  The patient states he feels much better.   CHEST/RESPIRATORY:  No cough.  No shortness of breath.  CARDIAC:   No chest pain.   GI:  No nausea,  vomiting or abdominal pain.  MUSCULOSKELETAL:  He feels a lot stronger.   NEUROLOGICAL:   Is able to vocalize well, control his head, etc.    PHYSICAL EXAMINATION:   GENERAL APPEARANCE:  The patient looks a lot better.  Able to bring himself to a standing position.  This is quite a bit better than when I first saw him.   CHEST/RESPIRATORY:  Shallow, but otherwise clear air entry.  There is no wheezing.  No stridor.   CARDIOVASCULAR:  CARDIAC:   Heart sounds are normal.  There are no murmurs.   GASTROINTESTINAL:  ABDOMEN:  PEG site looks fine.   No masses.  No tenderness.  LIVER/SPLEEN/KIDNEYS:  No liver, no spleen.  SKIN:  INSPECTION:  He has two areas, one on the posterior right leg, roughly the mid aspect of his right lower leg.  This has a clean base, although there are several millimeters of depth.  Just to the lateral aspect of his right scapula, there is an area that is covered with an adherent eschar.  This was anesthetized with lidocaine and removed with a #10 scalpel.  There was no bleeding.  The patient tolerated this well.  This will need Santyl.   NEUROLOGICAL:    CRANIAL NERVES:  His cranial nerves seem improved, left facial weakness.  There are no extraocular movement palsies and no fatigue.   SENSATION/STRENGTH:  Upper and lower extremity strength is normal.  Control of his neck musculature seems back to normal to me.   OTHER:  He has a tracheostomy in place.  He can talk clearly.  ASSESSMENT/PLAN:  MRSA sepsis.  Felt to be secondary to infected abscess on his posterior right leg.  The pathogenesis of the wound on his back is uncertain.  He is on vancomycin.  He will see Infectious Disease tomorrow.    Abscess wounds as noted above.  I  have written orders here which include collagen and hydrogel to the posterior right leg and Santyl to the right scapular area.    Myasthenia gravis with recent myasthenia crisis.  He will need an appointment with Dr. Anne Hahn of Neurology in followup.  However, this is a lot better than when I saw him last time.    The patient is making significant progress.  He is still not cleared to take anything orally.  He is not a smoker and does not have a history of lung disease.  I am a bit surprised they did not address the tracheostomy while he was in hospital.  However, he follows with the [facility].     CPT CODE: 16109

## 2012-10-02 ENCOUNTER — Ambulatory Visit (HOSPITAL_COMMUNITY)
Admit: 2012-10-02 | Discharge: 2012-10-02 | Disposition: A | Discharge status: Home / Self Care | Payer: Medicaid Other | Attending: Pulmonary Disease | Admitting: Pulmonary Disease

## 2012-10-02 VITALS — BP 104/63 | HR 63 | Resp 18

## 2012-10-02 DIAGNOSIS — G7000 Myasthenia gravis without (acute) exacerbation: Secondary | ICD-10-CM | POA: Insufficient documentation

## 2012-10-02 DIAGNOSIS — Z430 Encounter for attention to tracheostomy: Secondary | ICD-10-CM | POA: Insufficient documentation

## 2012-10-02 DIAGNOSIS — J9620 Acute and chronic respiratory failure, unspecified whether with hypoxia or hypercapnia: Secondary | ICD-10-CM | POA: Insufficient documentation

## 2012-10-02 DIAGNOSIS — Z930 Tracheostomy status: Secondary | ICD-10-CM

## 2012-10-02 NOTE — Progress Notes (Signed)
[facility]  Hospitalized 3/23 - 3/31 with following problems identified:  Principal Problem:  Cellulitis and abscess of leg  MRSA bacteremia Myasthenia gravis  Tracheostomy status  Acute-on-chronic respiratory failure  History of DVT (deep vein thrombosis)  Abscess of back  Abscess, sacrum  Myasthenic crisis  G tube feedings  SUBJ: No complaints. No distress. Wants T tube out. Has not been ventilated in 3 wks or more. Minimal secretions. Remains NPO on G tube feedings. Has f/u appt with Neurology  OBJ: NAD L lid lag much improved Voice strong Cough strong Trach site clean Chest clear RRR G tube site clean Abd soft Ext warm without edema 5/5 strength in all extremities   IMP: Chronic trach dependence - was placed in setting of severe bulbar symptoms due to MG. Now much improved  PLAN: Decannulate today (4/24) Keep trach site covered loosely with dry gauze until stoma closes (should be 48-72 hrs) May wipe down site with damp washcloth  Once trach site closes completely may clean with soap and water F/U in trach clinic 4/29 to check stoma site   [name], MD ; [facility] service Mobile [phone].  After 5:30 PM or weekends, call [phone]

## 2012-10-02 NOTE — Progress Notes (Signed)
Tracheostomy Procedure Note  Victor Durham [id number] July 20, 1955   Procedure: Pt. Was decannuled, site was clean and dry, Dr. To send note to nursing facility on dressing chg, cleaning and maintance.    Tracheostomy Information  Trach Brand: Shiley Size: 6.0 Style: Cuffed Secured by: Velcro  Evaluation:  ETCO2 positive color change from yellow to purple : NA Vital signs:blood pressure , pulse , respirations and pulse oximetry  % Patients current condition: stable Complications: No apparent complications Trach site exam: clean, dry Wound care done: dry, clean and 4 x 4 gauze Patient did tolerate procedure well.   E  Additional needs: Pt. To come back to trach clinic on Tues. 29 for follow up.

## 2012-10-07 ENCOUNTER — Ambulatory Visit (HOSPITAL_COMMUNITY)
Admission: RE | Admit: 2012-10-07 | Discharge: 2012-10-07 | Disposition: A | Discharge status: Home / Self Care | Payer: Medicaid Other | Source: Ambulatory Visit | Attending: Internal Medicine | Admitting: Internal Medicine

## 2012-10-07 VITALS — BP 105/69 | HR 74 | Resp 20

## 2012-10-07 DIAGNOSIS — G7000 Myasthenia gravis without (acute) exacerbation: Secondary | ICD-10-CM | POA: Insufficient documentation

## 2012-10-07 DIAGNOSIS — T17908A Unspecified foreign body in respiratory tract, part unspecified causing other injury, initial encounter: Secondary | ICD-10-CM

## 2012-10-07 DIAGNOSIS — Z86718 Personal history of other venous thrombosis and embolism: Secondary | ICD-10-CM | POA: Insufficient documentation

## 2012-10-07 DIAGNOSIS — Z430 Encounter for attention to tracheostomy: Secondary | ICD-10-CM | POA: Insufficient documentation

## 2012-10-07 DIAGNOSIS — J9620 Acute and chronic respiratory failure, unspecified whether with hypoxia or hypercapnia: Secondary | ICD-10-CM

## 2012-10-07 DIAGNOSIS — J9600 Acute respiratory failure, unspecified whether with hypoxia or hypercapnia: Secondary | ICD-10-CM

## 2012-10-07 DIAGNOSIS — T17800A Unspecified foreign body in other parts of respiratory tract causing asphyxiation, initial encounter: Secondary | ICD-10-CM

## 2012-10-07 NOTE — Progress Notes (Signed)
Tracheostomy Procedure Note  Victor Durham [id number] 04-21-1956   Procedure    Tracheostomy Information  Trach Brand:  Size Secured by:   Evaluation:Trach site no redness, minimal drainage, clean : Vital signs:blood pressure 118/711pulse 79, respirations 18 and pulse oximetry 95% Patients current condition: stable Complications: No apparent complications Trach site exam: clean, healing Wound care done: dry, sterile, clean and 4 x 4 gauze Patient did tolerate procedure well.   Education:Dr. Tyson Alias explained to patient importance of cleaning stoma. Dr. Tyson Alias sending note to nursing facility with instructions on trach care.  Additional needs: Patient to come back to trach clinic in one month pending trach clinic schedule if trach does not heal.

## 2012-10-07 NOTE — Progress Notes (Signed)
[facility]  Hospitalized 3/23 - 3/31 with following problems identified:  Principal Problem:  Cellulitis and abscess of leg  MRSA bacteremia Myasthenia gravis  Tracheostomy status  Acute-on-chronic respiratory failure  History of DVT (deep vein thrombosis)  Abscess of back  Abscess, sacrum  Myasthenic crisis  G tube feedings  SUBJ: No complaints. Minimal secretions through stoma, voice not back to baseline unless covers stoma up, no fevers 118/71 79 95% on RA, T 98.2 rr 12 OBJ: General: no distress Neuro: intact, improved strength HEENT: stoma 90% closed, min clear secretions, no erythema, no warmth PULM: mild diffuse ronchi insipiration CV: s1 s2 RRR GI: soft, PEg wnl Extremities: no edema  IMP: Chronic trach dependence - was placed in setting of severe bulbar symptoms due to MG. Now much improved S/p decannulation 5 days ago, remains small fistula tract  PLAN: Decannulate (4/24), still with fistula tract small Revisit in 4 weeks if NOT healed at that stage, if healing to close and normal voice no need to follow up at [facility] clinic Keep trach site covered with dry gauze until stoma closes completely, bid dressing changes May wipe down site with damp washcloth  Once trach site closes completely may clean with soap and water F/U in trach clinic in 4 weeks or earlier if stoma increases in size MANDATORY to DRESS AS ABOVE Have NUTRITION re eval to improve would healing No swimming under water Re assess swallow now that trach out, per nueorlogy  Mcarthur Rossetti. Tyson Alias, MD, FACP Pgr: [phone] [place] [facility]

## 2012-10-23 ENCOUNTER — Other Ambulatory Visit (HOSPITAL_COMMUNITY): Payer: Self-pay | Admitting: Internal Medicine

## 2012-10-23 DIAGNOSIS — R1310 Dysphagia, unspecified: Secondary | ICD-10-CM

## 2012-10-30 ENCOUNTER — Ambulatory Visit (HOSPITAL_COMMUNITY)
Admission: RE | Admit: 2012-10-30 | Discharge: 2012-10-30 | Disposition: A | Discharge status: Home / Self Care | Payer: Medicaid Other | Source: Ambulatory Visit | Attending: Internal Medicine | Admitting: Internal Medicine

## 2012-10-30 DIAGNOSIS — R1313 Dysphagia, pharyngeal phase: Secondary | ICD-10-CM | POA: Insufficient documentation

## 2012-10-30 DIAGNOSIS — R1319 Other dysphagia: Secondary | ICD-10-CM | POA: Insufficient documentation

## 2012-10-30 DIAGNOSIS — R1310 Dysphagia, unspecified: Secondary | ICD-10-CM

## 2012-10-30 NOTE — Procedures (Signed)
Objective Swallowing Evaluation: Modified Barium Swallowing Study  Patient Details  Name: Victor Durham MRN: [id number] Date of Birth: 01-10-1956  Today's Date: 10/30/2012 Time: 1200-1220 SLP Time Calculation (min): 20 min  Past Medical History:  Past Medical History  Diagnosis Date  . DVT (deep venous thrombosis)   . Hypertension   . High cholesterol   . Myasthenia gravis   . Anxiety   . Dysphagia 06/30/12   Past Surgical History:  Past Surgical History  Procedure Laterality Date  . Esophagogastroduodenoscopy  07/11/2012    Procedure: ESOPHAGOGASTRODUODENOSCOPY (EGD);  Surgeon: Shirley Friar, MD;  Location: [facility];  Service: Endoscopy;  Laterality: N/A;  BEDSIDE  . Tracheostomy N/A   . Tee without cardioversion N/A 09/09/2012    Procedure: TRANSESOPHAGEAL ECHOCARDIOGRAM (TEE);  Surgeon: Dolores Patty, MD;  Location: [facility] END[facility]COPY;  Service: Cardiovascular;  Laterality: N/A;   HPI:  57 y/o homeless male now at SNF. PMH includes hospitalizations 1/30 for Myasthenia Gravis and aspiration pna. Mestinon, plasmapheresis started 2/4. ETT 1/30-2/9. Trach placed 2/11, PEG 2/13. Pt evaluated on 2/14 for PMSV, by 2/26 prior to d/c pt was tolerating during all waking hours with full supervision moving toward intermittent supervision. Pt d/c'd to SNF. Readmitted 3/23 with RLE cellulitis and G+ bacteremia. Became obtunded on 3/26 and ABG reveled profound hypercarbia / acidosis. off the vent on 3/29. Patient has since been decannulated. Currently participating in swallowing therapy including pharyngeal strengthening exercises at SNF. Is hopeful for ability to initiate some form of po today after testing.      Assessment / Plan / Recommendation Clinical Impression  Dysphagia Diagnosis: Severe pharyngeal phase dysphagia;Severe cervical esophageal phase dysphagia Clinical impression: Patient continues to present wtih a severe pharyngeal and esophageal dysphagia characterized by  largely absent pharyngeal constriction and epiglottic deflection, minimal hyolaryngeal elevation/excursion, and severely decreased UES relaxation resulting in severe pharyngeal residuals which are deeply penetrated post swallow without awareness. Eventual cues for hard throat clears/cough clear laryngeal vestibule however patient attempts to dry swallow (6-7 x plus per bite/sip) are unsuccessful to assist in any clearance of pos. Swallowing remains non-functional at this time. Recommend continued aggressive therapy.     Treatment Recommendation  Defer treatment plan to SLP at (Comment) (SNF)    Diet Recommendation NPO   Medication Administration: Via alternative means    Other  Recommendations Oral Care Recommendations: Oral care QID   Follow Up Recommendations  Skilled Nursing facility            General HPI: 57 y/o homeless male now at SNF. PMH includes hospitalizations 1/30 for Myasthenia Gravis and aspiration pna. Mestinon, plasmapheresis started 2/4. ETT 1/30-2/9. Trach placed 2/11, PEG 2/13. Pt evaluated on 2/14 for PMSV, by 2/26 prior to d/c pt was tolerating during all waking hours with full supervision moving toward intermittent supervision. Pt d/c'd to SNF. Readmitted 3/23 with RLE cellulitis and G+ bacteremia. Became obtunded on 3/26 and ABG reveled profound hypercarbia / acidosis. off the vent on 3/29. Patient has since been decannulated. Currently participating in swallowing therapy including pharyngeal strengthening exercises at SNF. Is hopeful for ability to initiate some form of po today after testing.  Type of Study: Modified Barium Swallowing Study Reason for Referral: Objectively evaluate swallowing function Previous Swallow Assessment: 09/17/12-recommended NPO Diet Prior to this Study: NPO;PEG tube Temperature Spikes Noted: No Respiratory Status: Room air History of Recent Intubation: No Behavior/Cognition: Alert;Cooperative;Pleasant mood Oral Motor / Sensory Function:  Within functional limits Self-Feeding Abilities: Able to feed self  Patient Positioning: Upright in chair Baseline Vocal Quality: Other (comment) (hypernasal) Volitional Cough: Strong Volitional Swallow: Able to elicit Anatomy: Within functional limits Pharyngeal Secretions: Not observed secondary MBS    Reason for Referral Objectively evaluate swallowing function   Oral Phase Oral Preparation/Oral Phase Oral Phase: WFL   Pharyngeal Phase Pharyngeal Phase Pharyngeal Phase: Impaired Pharyngeal - Nectar Pharyngeal - Nectar Teaspoon: Reduced pharyngeal peristalsis;Reduced epiglottic inversion;Reduced anterior laryngeal mobility;Reduced laryngeal elevation;Reduced airway/laryngeal closure;Penetration/Aspiration after swallow;Pharyngeal residue - valleculae;Pharyngeal residue - pyriform sinuses;Pharyngeal residue - cp segment;Compensatory strategies attempted (Comment) (various head postures ineffective) Penetration/Aspiration details (nectar teaspoon): Material enters airway, CONTACTS cords and not ejected out Pharyngeal - Thin Pharyngeal - Thin Teaspoon: Reduced pharyngeal peristalsis;Reduced epiglottic inversion;Reduced anterior laryngeal mobility;Reduced laryngeal elevation;Reduced airway/laryngeal closure;Penetration/Aspiration after swallow;Pharyngeal residue - valleculae;Pharyngeal residue - pyriform sinuses;Pharyngeal residue - cp segment Penetration/Aspiration details (thin teaspoon): Material enters airway, CONTACTS cords and not ejected out  Cervical Esophageal Phase    GO    Cervical Esophageal Phase Cervical Esophageal Phase: Impaired Cervical Esophageal Phase - Nectar Nectar Teaspoon: Reduced cricopharyngeal relaxation Cervical Esophageal Phase - Thin Thin Teaspoon: Reduced cricopharyngeal relaxation    Functional Assessment Tool Used: skilled clinical judgement Functional Limitations: Swallowing Swallow Current Status (Z6109): 100 percent impaired, limited or  restricted Swallow Goal Status (U0454): 100 percent impaired, limited or restricted Swallow Discharge Status (U9811): 100 percent impaired, limited or restricted   Victor Lango MA, CCC-SLP [phone]  Egor Fullilove Meryl 10/30/2012, 2:09 PM

## 2012-11-11 ENCOUNTER — Non-Acute Institutional Stay (SKILLED_NURSING_FACILITY): Payer: Medicaid Other | Admitting: Internal Medicine

## 2012-11-11 DIAGNOSIS — L0291 Cutaneous abscess, unspecified: Secondary | ICD-10-CM

## 2012-11-11 DIAGNOSIS — L0390 Cellulitis, unspecified: Secondary | ICD-10-CM

## 2012-11-13 ENCOUNTER — Non-Acute Institutional Stay (SKILLED_NURSING_FACILITY): Payer: Medicaid Other | Admitting: Adult Health

## 2012-11-13 DIAGNOSIS — B029 Zoster without complications: Secondary | ICD-10-CM

## 2012-11-13 DIAGNOSIS — G4700 Insomnia, unspecified: Secondary | ICD-10-CM

## 2012-11-13 DIAGNOSIS — D649 Anemia, unspecified: Secondary | ICD-10-CM

## 2012-11-13 DIAGNOSIS — F22 Delusional disorders: Secondary | ICD-10-CM

## 2012-11-13 DIAGNOSIS — I80299 Phlebitis and thrombophlebitis of other deep vessels of unspecified lower extremity: Secondary | ICD-10-CM

## 2012-11-13 DIAGNOSIS — K219 Gastro-esophageal reflux disease without esophagitis: Secondary | ICD-10-CM

## 2012-11-13 DIAGNOSIS — G7000 Myasthenia gravis without (acute) exacerbation: Secondary | ICD-10-CM

## 2012-11-13 DIAGNOSIS — J9610 Chronic respiratory failure, unspecified whether with hypoxia or hypercapnia: Secondary | ICD-10-CM

## 2012-11-13 DIAGNOSIS — R1310 Dysphagia, unspecified: Secondary | ICD-10-CM

## 2012-11-18 ENCOUNTER — Non-Acute Institutional Stay (SKILLED_NURSING_FACILITY): Payer: Medicaid Other | Admitting: Internal Medicine

## 2012-11-18 DIAGNOSIS — L0291 Cutaneous abscess, unspecified: Secondary | ICD-10-CM

## 2012-11-18 DIAGNOSIS — G7000 Myasthenia gravis without (acute) exacerbation: Secondary | ICD-10-CM

## 2012-11-18 NOTE — Progress Notes (Signed)
Patient ID: Victor Durham, male   DOB: March 06, 1956, 57 y.o.   MRN: [id number]           PROGRESS NOTE  DATE:  11/11/2012  FACILITY: [facility]   LEVEL OF CARE:   SNF   Acute Visit   CHIEF COMPLAINT:  Review of wound on his back.    HISTORY OF PRESENT ILLNESS:  Mr. Amborn is a gentleman who has been in the facility since February, initially with an acute and new diagnosis of myasthenia gravis.  He was readmitted to hospital in early April with cellulitis and abscess on his leg which has since resolved.  He also had an abscess on his back and sacrum.  At that point, he had MRSA bacteremia.  The abscess was opened in the hospital.  This area on his back has never really closed and I am looking at this.       PHYSICAL EXAMINATION:   SKIN:  INSPECTION:  There is a superficial, oval wound on the left back just under the tip of the scapula.  He has some surrounding irritation here which may simply be from the dressing or foliculitis.  We have been using collagen/Hydrogel to this.  There is no real evidence of infection at present, certainly no evidence of cellulitis.    ASSESSMENT/PLAN:  Abscess, back.  This is a wound that is superficial.  I have dressed this with a Medihoney and a foam dressing.  The wound bed looks dry.  I do not see anything ominous here.    CPT CODE: 40981

## 2012-11-25 ENCOUNTER — Encounter: Payer: Self-pay | Admitting: Neurology

## 2012-11-25 ENCOUNTER — Ambulatory Visit (INDEPENDENT_AMBULATORY_CARE_PROVIDER_SITE_OTHER): Payer: Medicaid Other | Admitting: Neurology

## 2012-11-25 VITALS — BP 120/72 | HR 71 | Ht 73.0 in | Wt 160.0 lb

## 2012-11-25 DIAGNOSIS — G7000 Myasthenia gravis without (acute) exacerbation: Secondary | ICD-10-CM

## 2012-11-25 DIAGNOSIS — Z5181 Encounter for therapeutic drug level monitoring: Secondary | ICD-10-CM

## 2012-11-25 MED ORDER — AZATHIOPRINE 50 MG PO TABS: 100.0000 mg | ORAL_TABLET | Freq: Every day | ORAL | Status: DC

## 2012-11-25 NOTE — Progress Notes (Signed)
Reason for visit: Myasthenia gravis  Victor Durham is a 57 y.o. male  History of present illness:  Victor Durham is a 57 year old right-handed white male with a history of myasthenia gravis. The patient presented to the hospital in January 2014. The patient indicates that he had some weakness beginning 2 or 3 weeks before he went to the hospital, and he had several emergency room visits, and he was not found to have any problems and went home. Eventually, the patient fell while trying to get on a city bus, and he was admitted for weakness. The patient ended up with ventilator dependence, and eventually a tracheotomy. The patient had difficulty with swallowing, and a PEG tube has been placed. The patient still is unable to eat independently. The patient has had an injury to the left eye in the past, and he is essentially blind, and he never reported any double vision. The patient did have ptosis on the left eye. The patient had weakness of the lower extremities greater than the upper extremities, and he had difficulty walking. The patient was discharged to a rehabilitation facility, but returned to the hospital with MRSA sepsis in March of 2014. The patient is now back in the rehabilitation facility, and he is appears to be doing fairly well. The patient is on prednisone 40 mg daily, and he takes Mestinon 90 mg 5 times daily. The patient is tolerating this medication well. The patient indicates that he is now having no problems with breathing. The patient is able to generate normal speech, and he denies any jaw weakness. The patient has undergone a CT scan of the chest that did not show a thymoma. There is some question of a lesion in the liver, but it is not clear that this was ever worked up. The patient is sent to this office for an evaluation.  Past Medical History  Diagnosis Date  . DVT (deep venous thrombosis)   . Hypertension   . High cholesterol   . Myasthenia gravis   . Anxiety    . Dysphagia 06/30/12    Past Surgical History  Procedure Laterality Date  . Esophagogastroduodenoscopy  07/11/2012    Procedure: ESOPHAGOGASTRODUODENOSCOPY (EGD);  Surgeon: Shirley Friar, MD;  Location: [facility];  Service: Endoscopy;  Laterality: N/A;  BEDSIDE  . Tracheostomy N/A   . Tee without cardioversion N/A 09/09/2012    Procedure: TRANSESOPHAGEAL ECHOCARDIOGRAM (TEE);  Surgeon: Dolores Patty, MD;  Location: [facility] ENDOSCOPY;  Service: Cardiovascular;  Laterality: N/A;    Family History  Problem Relation Age of Onset  . Myasthenia gravis Father     Social history:  reports that he has never smoked. He does not have any smokeless tobacco history on file. He reports that he drinks about 3.5 ounces of alcohol per week. He reports that he does not use illicit drugs.  Medications:  Current Outpatient Prescriptions on File Prior to Visit  Medication Sig Dispense Refill  . albuterol (PROVENTIL) (5 MG/ML) 0.5% nebulizer solution Take 2.5 mg by nebulization every 8 (eight) hours. Scheduled dose, trach care      . ALPRAZolam (XANAX) 0.5 MG tablet Give 0.5 mg by tube every 3 (three) hours as needed for anxiety.      Marland Kitchen aspirin 81 MG chewable tablet Give 81 mg by tube daily.      . chlorhexidine (PERIDEX) 0.12 % solution Use as directed 15 mLs in the mouth or throat 6 (six) times daily. Swab mouth at 10am and 10pm  Rinse mouth 0600, 1200, 1800, and 0001      . Nutritional Supplements (FIBERSOURCE HN) LIQD Give 70 mL/hr by tube continuous.      . pantoprazole sodium (PROTONIX) 40 mg/20 mL PACK Place 40 mg into feeding tube daily.      . predniSONE (DELTASONE) 20 MG tablet Place 2 tablets (40 mg total) into feeding tube daily with breakfast.  60 tablet  PRN  . pyridostigmine (MESTINON) 60 MG/5ML syrup Give 90 mg by tube 5 (five) times daily. 7.33ml      . sodium chloride 0.9 % SOLN 150 mL with vancomycin 1000 MG SOLR 750 mg Inject 750 mg into the vein every 8 (eight) hours. Vancomycin  trough due 4/15. Goal level 15-20.  750 mg  PRN  . traZODone (DESYREL) 100 MG tablet Give 100 mg by tube at bedtime.      Marland Kitchen warfarin (COUMADIN) 5 MG tablet Take 5 mg by mouth every Monday, Wednesday, and Friday.      . warfarin (COUMADIN) 7.5 MG tablet Give 7.5 mg by tube every Tuesday, Thursday, Saturday, and Sunday at 6 PM.      . Water For Irrigation, Sterile (STERILE WATER FOR IRRIGATION) Give 200 mLs by tube every 4 (four) hours.       No current facility-administered medications on file prior to visit.    Allergies: No Known Allergies  ROS:  Out of a complete 14 system review of symptoms, the patient complains only of the following symptoms, and all other reviewed systems are negative.  Weight gain Blurred vision  Blood pressure 120/72, pulse 71, height 6\' 1"  (1.854 m), weight 160 lb (72.576 kg).  Physical Exam  General: The patient is alert and cooperative at the time of the examination.  Head: Pupils are equal, round, and reactive to light. Discs are flat on the right, cannot be visualized on the left secondary to a corneal opacity.  Neck: The neck is supple, no carotid bruits are noted.  Respiratory: The respiratory examination is clear.  Cardiovascular: The cardiovascular examination reveals a regular rate and rhythm, no obvious murmurs or rubs are noted.  Skin: Extremities are without significant edema.  Neurologic Exam  Mental status:  Cranial nerves: Facial symmetry is present. There is good sensation of the face to pinprick and soft touch bilaterally. The strength of the facial muscles and the muscles to head turning and shoulder shrug are normal bilaterally. Speech is well enunciated, no aphasia or dysarthria is noted. Extraocular movements are full. Visual fields are full with the right eye, the patient is essentially blind with a left. With superior gaze for 1 minute, there is slight divergence of gaze, with exotropia of the left eye after about 30 seconds.  Ptosis develops with the left eye after about 40 seconds. No jaw weakness is seen.  Motor: The motor testing reveals 5 over 5 strength of all 4 extremities. Good symmetric motor tone is noted throughout. With the arms outstretched 1 minute, the patient has no fatigable weakness of the deltoid muscles.  Sensory: Sensory testing is intact to pinprick, soft touch, vibration sensation, and position sense on all 4 extremities. No evidence of extinction is noted.  Coordination: Cerebellar testing reveals good finger-nose-finger and heel-to-shin bilaterally.  Gait and station: Gait is normal. Tandem gait is slightly unsteady. Romberg is negative. No drift is seen.  Reflexes: Deep tendon reflexes are symmetric and normal bilaterally. Toes are downgoing bilaterally.   Assessment/Plan:  1. Myasthenia gravis  The patient presented  with dysphagia, and respiratory issues. The patient is now on prednisone and Mestinon. The patient is doing fairly well. At this point, we will start Imuran, and eventually reduce the prednisone dosing. The patient will followup in 3 months. Blood work will be done today. Hopefully, the patient will be able to start swallowing and eat a regular diet soon.  Marlan Palau MD 11/25/2012 8:20 PM  [facility] [address] [place], [place] [address]  Phone [phone] Fax [phone]

## 2012-11-26 LAB — CBC WITH DIFFERENTIAL
Basophils Absolute: 0 10*3/uL (ref 0.0–0.2)
Eosinophils Absolute: 0 10*3/uL (ref 0.0–0.4)
Immature Grans (Abs): 0 10*3/uL (ref 0.0–0.1)
Immature Granulocytes: 0 % (ref 0–2)
Lymphocytes Absolute: 0.6 10*3/uL — ABNORMAL LOW (ref 0.7–3.1)
MCH: 31.5 pg (ref 26.6–33.0)
MCV: 92 fL (ref 79–97)
Monocytes Absolute: 0.1 10*3/uL (ref 0.1–0.9)
Neutrophils Relative %: 89 % — ABNORMAL HIGH (ref 40–74)
Platelets: 207 10*3/uL (ref 150–379)
RBC: 4.89 x10E6/uL (ref 4.14–5.80)
RDW: 14.1 % (ref 12.3–15.4)

## 2012-11-26 LAB — COMPREHENSIVE METABOLIC PANEL
AST: 22 IU/L (ref 0–40)
Albumin: 4.5 g/dL (ref 3.5–5.5)
Alkaline Phosphatase: 54 IU/L (ref 39–117)
BUN/Creatinine Ratio: 28 — ABNORMAL HIGH (ref 9–20)
BUN: 19 mg/dL (ref 6–24)
CO2: 29 mmol/L (ref 18–29)
Chloride: 96 mmol/L — ABNORMAL LOW (ref 97–108)
GFR calc Af Amer: 124 mL/min/{1.73_m2} (ref >59)
Globulin, Total: 2.7 g/dL (ref 1.5–4.5)
Sodium: 137 mmol/L (ref 134–144)
Total Bilirubin: 0.6 mg/dL (ref 0.0–1.2)

## 2012-11-26 NOTE — Progress Notes (Signed)
Quick Note:  Spoke with patient's caregiver at [facility] and faxed the results of their labs as requested to the attention of Fatmata.  ______

## 2012-11-28 NOTE — Progress Notes (Addendum)
Patient ID: Victor Durham, male   DOB: 1956-06-05, 57 y.o.   MRN: [id number]           PROGRESS NOTE  DATE:  11/18/2012  FACILITY: [facility]   LEVEL OF CARE:   SNF   Acute Visit    CHIEF COMPLAINT:  Rash on his back underneath the right scapula.    HISTORY OF PRESENT ILLNESS:  This is a complicated man who initially came to [place] in February after a flare of myasthenia gravis.  He was trach and PEG tube dependent.  He has really done very well from this regard and has made a very good recovery.  He remains on prednisone 40 mg daily, and pyridostigmine 90 mg five times a day.    There has also been discussion that I am only partially aware of about a psychiatric history in this man, mostly coming from his father who says that he is delusional.   I am not certain how long this has been going on.  He did not wish the psychiatric service in the facility.  My understanding is that he has an outside appointment.     I saw him last week for a wound on his back.  This was covered with slough, which I removed and dressed.   Since then, he has developed other areas.  He was seen by one of our practitioners, who felt he had shingles.  I can see why this thought would come up, although I am doubtful this is what this is.  He has a history of MRSA sepsis, felt to be secondary to an abscess on his right posterior back.  I wonder whether this is folliculitis.  It appears that one of these areas is developing into a small abscess.    PHYSICAL EXAMINATION:   NEUROLOGICAL:   I do not think this man would have any deficits at this point.  I think the prednisone can begin to be tapered and I will initiate this.   I have asked for a Neurology review.  They were following him.  I am not sure where this is.   SKIN:  INSPECTION:  Over the back just under the scapula are several areas, including a large raised one which is somewhat tender.  I suspect this may be MRSA infection.  Whether or not he has shingles,  it is certainly coexistently infected.    ASSESSMENT/PLAN:  Staph aureus infection, skin, cellulitis.  I am going to begin an antibiotic.  I would like Aquacel AG over this entire area.  The most inferior of these areas may need to be I&Ded.    Myasthenia gravis.  This is very stable and the patient has become much more functional/independent.  I am going to begin tapering the prednisone. Follow with neurology.  Delusional thought; Per the patient's father. I have not reviewed this in any detail as the patient already apparently has an outside psychiatric consult. He does seem in any danger to self or others. He apparently is homeless and there are no current discharge options  CPT CODE: 13086

## 2012-12-18 ENCOUNTER — Non-Acute Institutional Stay (SKILLED_NURSING_FACILITY): Payer: Medicaid Other | Admitting: Adult Health

## 2012-12-18 DIAGNOSIS — Z86718 Personal history of other venous thrombosis and embolism: Secondary | ICD-10-CM

## 2012-12-18 DIAGNOSIS — G4700 Insomnia, unspecified: Secondary | ICD-10-CM

## 2012-12-18 DIAGNOSIS — K219 Gastro-esophageal reflux disease without esophagitis: Secondary | ICD-10-CM

## 2012-12-18 DIAGNOSIS — F22 Delusional disorders: Secondary | ICD-10-CM

## 2012-12-18 DIAGNOSIS — R1310 Dysphagia, unspecified: Secondary | ICD-10-CM

## 2012-12-18 DIAGNOSIS — Z8672 Personal history of thrombophlebitis: Secondary | ICD-10-CM

## 2012-12-18 DIAGNOSIS — G7000 Myasthenia gravis without (acute) exacerbation: Secondary | ICD-10-CM

## 2012-12-18 DIAGNOSIS — J9620 Acute and chronic respiratory failure, unspecified whether with hypoxia or hypercapnia: Secondary | ICD-10-CM

## 2012-12-22 ENCOUNTER — Other Ambulatory Visit (HOSPITAL_COMMUNITY): Payer: Self-pay | Admitting: Internal Medicine

## 2012-12-22 DIAGNOSIS — R1310 Dysphagia, unspecified: Secondary | ICD-10-CM

## 2012-12-25 ENCOUNTER — Ambulatory Visit (HOSPITAL_COMMUNITY)
Admission: RE | Admit: 2012-12-25 | Discharge: 2012-12-25 | Disposition: A | Discharge status: Home / Self Care | Payer: Medicaid Other | Source: Ambulatory Visit | Attending: Internal Medicine | Admitting: Internal Medicine

## 2012-12-25 DIAGNOSIS — R1313 Dysphagia, pharyngeal phase: Secondary | ICD-10-CM | POA: Insufficient documentation

## 2012-12-25 DIAGNOSIS — R1310 Dysphagia, unspecified: Secondary | ICD-10-CM

## 2012-12-25 NOTE — Procedures (Signed)
Objective Swallowing Evaluation: Modified Barium Swallowing Study  Patient Details  Name: Victor Durham MRN: [id number] Date of Birth: 08-15-1955  Today's Date: 12/25/2012 Time: 1137-1204 SLP Time Calculation (min): 27 min  Past Medical History:  Past Medical History  Diagnosis Date  . DVT (deep venous thrombosis)   . Hypertension   . High cholesterol   . Myasthenia gravis   . Anxiety   . Dysphagia 06/30/12   Past Surgical History:  Past Surgical History  Procedure Laterality Date  . Esophagogastroduodenoscopy  07/11/2012    Procedure: ESOPHAGOGASTRODUODENOSCOPY (EGD);  Surgeon: Shirley Friar, MD;  Location: [facility];  Service: Endoscopy;  Laterality: N/A;  BEDSIDE  . Tracheostomy N/A   . Tee without cardioversion N/A 09/09/2012    Procedure: TRANSESOPHAGEAL ECHOCARDIOGRAM (TEE);  Surgeon: Dolores Patty, MD;  Location: [facility] ENDOSCOPY;  Service: Cardiovascular;  Laterality: N/A;   HPI:  57 y/o homeless male now at SNF. PMH includes hospitalizations 1/30 for Myasthenia Gravis and aspiration pna. Mestinon, plasmapheresis started 2/4. ETT 1/30-2/9. Trach placed 2/11, PEG 2/13. Pt evaluated on 2/14 for PMSV, by 2/26 prior to d/c pt was tolerating during all waking hours with full supervision moving toward intermittent supervision. Pt d/c'd to SNF. Readmitted 3/23 with RLE cellulitis and G+ bacteremia. Became obtunded on 3/26 and ABG reveled profound hypercarbia / acidosis. off the vent on 3/29. Patient has since been decannulated. Currently participating in swallowing therapy including pharyngeal strengthening exercises at SNF. Pt reports he has been consuming PO for about 3 weeks, Dys 1/nectar. Returns today for possible upgrade, d/c from SNF pending upgrade.      Assessment / Plan / Recommendation Clinical Impression  Dysphagia Diagnosis: Mild pharyngeal phase dysphagia Clinical impression: Pt demonstrates dramatic improvement in swallow function since last MBS.  Pharyngeal dysphagia now characterized as mild. Pt with mild delay in swallow initiation with mildly decreased epiglottic deflection and base of tongue retraction, still with absent laryngeal sensation of aspirate. The pt was able to consume solids, nectar thick liquids and small single cup sips of thin liquids with only mild vallecular residuals. With larger, straw or consective sips the pt silently aspirates in approx 24% of attempts. With cues to focus, swallow hard and swallow agian, he is able to consume thin liquids with minimal risk. Defer diet to treating SLP, but suggest a regular diet with thin liquids via cup only, pills in puree with continued strengthening exercises.     Treatment Recommendation  Defer treatment plan to SLP at (Comment)    Diet Recommendation Regular;Thin liquid   Liquid Administration via: Cup;No straw Medication Administration: Whole meds with puree Supervision: Patient able to self feed Compensations: Slow rate;Small sips/bites;Clear throat intermittently;Effortful swallow Postural Changes and/or Swallow Maneuvers: Seated upright 90 degrees    Other  Recommendations Oral Care Recommendations: Oral care BID   Follow Up Recommendations  Skilled Nursing facility    Frequency and Duration        Pertinent Vitals/Pain NA    SLP Swallow Goals     General HPI: 57 y/o homeless male now at SNF. PMH includes hospitalizations 1/30 for Myasthenia Gravis and aspiration pna. Mestinon, plasmapheresis started 2/4. ETT 1/30-2/9. Trach placed 2/11, PEG 2/13. Pt evaluated on 2/14 for PMSV, by 2/26 prior to d/c pt was tolerating during all waking hours with full supervision moving toward intermittent supervision. Pt d/c'd to SNF. Readmitted 3/23 with RLE cellulitis and G+ bacteremia. Became obtunded on 3/26 and ABG reveled profound hypercarbia / acidosis. off the vent  on 3/29. Patient has since been decannulated. Currently participating in swallowing therapy including  pharyngeal strengthening exercises at SNF. Pt reports he has been consuming PO for about 3 weeks, Dys 1/nectar. Returns today for possible upgrade, d/c from SNF pending upgrade.  Type of Study: Modified Barium Swallowing Study Reason for Referral: Objectively evaluate swallowing function Previous Swallow Assessment: 10/30/12 - NPO Diet Prior to this Study: Dysphagia 1 (puree);Nectar-thick liquids Temperature Spikes Noted: No Respiratory Status: Room air History of Recent Intubation: No Behavior/Cognition: Alert;Cooperative;Pleasant mood Oral Cavity - Dentition: Adequate natural dentition Oral Motor / Sensory Function: Within functional limits Self-Feeding Abilities: Able to feed self Patient Positioning: Upright in chair Baseline Vocal Quality: Clear Volitional Cough: Strong Volitional Swallow: Able to elicit Anatomy: Within functional limits Pharyngeal Secretions: Not observed secondary MBS    Reason for Referral Objectively evaluate swallowing function   Oral Phase Oral Preparation/Oral Phase Oral Phase: WFL   Pharyngeal Phase Pharyngeal Phase Pharyngeal Phase: Impaired Pharyngeal - Nectar Pharyngeal - Nectar Teaspoon: Not tested Pharyngeal - Nectar Cup: Delayed swallow initiation;Premature spillage to valleculae;Pharyngeal residue - valleculae Pharyngeal - Thin Pharyngeal - Thin Teaspoon: Not tested Pharyngeal - Thin Cup: Delayed swallow initiation;Pharyngeal residue - valleculae;Reduced tongue base retraction;Reduced epiglottic inversion Pharyngeal - Thin Straw: Delayed swallow initiation;Reduced epiglottic inversion;Reduced tongue base retraction;Penetration/Aspiration before swallow;Trace aspiration;Pharyngeal residue - valleculae Penetration/Aspiration details (thin straw): Material enters airway, passes BELOW cords without attempt by patient to eject out (silent aspiration);Material does not enter airway Pharyngeal - Solids Pharyngeal - Puree: Reduced epiglottic  inversion;Reduced tongue base retraction;Pharyngeal residue - valleculae Pharyngeal - Regular: Reduced epiglottic inversion;Reduced tongue base retraction;Pharyngeal residue - valleculae Pharyngeal - Pill: Delayed swallow initiation;Penetration/Aspiration before swallow;Trace aspiration Penetration/Aspiration details (pill): Material enters airway, passes BELOW cords without attempt by patient to eject out (silent aspiration) (aspirated thin with pill)  Cervical Esophageal Phase    GO    Cervical Esophageal Phase Cervical Esophageal Phase: [facility]    Functional Assessment Tool Used: skilled clinical judgement Functional Limitations: Swallowing Swallow Current Status (O1308): At least 20 percent but less than 40 percent impaired, limited or restricted Swallow Goal Status [phone]): At least 20 percent but less than 40 percent impaired, limited or restricted Swallow Discharge Status [phone]): At least 20 percent but less than 40 percent impaired, limited or restricted   [facility], [place] CCC-SLP [phone]  Claudine Mouton 12/25/2012, 1:41 PM

## 2013-01-12 ENCOUNTER — Non-Acute Institutional Stay (SKILLED_NURSING_FACILITY): Payer: Medicaid Other | Admitting: Internal Medicine

## 2013-01-12 DIAGNOSIS — K219 Gastro-esophageal reflux disease without esophagitis: Secondary | ICD-10-CM

## 2013-01-12 DIAGNOSIS — I80299 Phlebitis and thrombophlebitis of other deep vessels of unspecified lower extremity: Secondary | ICD-10-CM

## 2013-01-12 DIAGNOSIS — G7000 Myasthenia gravis without (acute) exacerbation: Secondary | ICD-10-CM

## 2013-01-12 DIAGNOSIS — G4700 Insomnia, unspecified: Secondary | ICD-10-CM

## 2013-01-16 DIAGNOSIS — I80299 Phlebitis and thrombophlebitis of other deep vessels of unspecified lower extremity: Secondary | ICD-10-CM | POA: Insufficient documentation

## 2013-01-16 DIAGNOSIS — G4700 Insomnia, unspecified: Secondary | ICD-10-CM | POA: Insufficient documentation

## 2013-01-16 DIAGNOSIS — K219 Gastro-esophageal reflux disease without esophagitis: Secondary | ICD-10-CM | POA: Insufficient documentation

## 2013-01-16 NOTE — Progress Notes (Signed)
PROGRESS NOTE  DATE: 01/12/2013  FACILITY: Nursing Home Location: [facility]  LEVEL OF CARE: SNF (31)  Routine Visit  CHIEF COMPLAINT:  Manage insomnia, myesthenia gravis & DVT  HISTORY OF PRESENT ILLNESS:  REASSESSMENT OF ONGOING PROBLEM(S):  INSOMNIA: The insomnia remains stable.  No complications noted from the medications presently being used. Patient denies ongoing insomnia, pain, hallucinations, delusions.  DVT: The DVTs remains stable.  Patient denies calf pain, chest pain or shortness of breath.  Patient is currently on anti-coagulation and does not report any complications from that.  MYESTHENIA GRAVIS: pts myesthenia gravis is stable.  He denies progression of sx recently.  No complications from the medications currently being used.  PAST MEDICAL HISTORY : Reviewed.  No changes.  CURRENT MEDICATIONS: Reviewed per [facility]  REVIEW OF SYSTEMS:  GENERAL: no change in appetite, no fatigue, no weight changes, no fever, chills or weakness RESPIRATORY: no cough, SOB, DOE, wheezing, hemoptysis CARDIAC: no chest pain, edema or palpitations GI: no abdominal pain, diarrhea, constipation, heart burn, nausea or vomiting  PHYSICAL EXAMINATION  VS:  T 96.9      P 64      RR 20      BP 102/66     POX %     WT (Lb) 166  GENERAL: no acute distress, normal body habitus EYES: conjunctivae normal, sclerae normal, normal eye lids NECK: supple, trachea midline, no neck masses, no thyroid tenderness, no thyromegaly LYMPHATICS: no LAN in the neck, no supraclavicular LAN RESPIRATORY: breathing is even & unlabored, BS CTAB CARDIAC: RRR, no murmur,no extra heart sounds, no edema GI: abdomen soft, normal BS, no masses, no tenderness, no hepatomegaly, no splenomegaly PSYCHIATRIC: the patient is alert & oriented to person, affect & behavior appropriate  LABS/RADIOLOGY:  4/14 bmp nl 3/14 cbc nl, AST 50, ALT 96 ow cmp nl  ASSESSMENT/PLAN:  Myesthenia  gravis-stable. Insomnia-denies on going sx. DVT-coumadin was dc'd. GERD-stable. Delusional d/o-stable. Elevated liver fcns- recheck.  Pt asx. Check HbA1c.  CPT CODE: 16109

## 2013-02-06 ENCOUNTER — Other Ambulatory Visit (HOSPITAL_BASED_OUTPATIENT_CLINIC_OR_DEPARTMENT_OTHER): Payer: Self-pay | Admitting: Internal Medicine

## 2013-02-06 DIAGNOSIS — R633 Feeding difficulties: Secondary | ICD-10-CM

## 2013-02-10 ENCOUNTER — Ambulatory Visit (HOSPITAL_COMMUNITY)
Admission: RE | Admit: 2013-02-10 | Discharge: 2013-02-10 | Disposition: A | Discharge status: Home / Self Care | Payer: Medicaid Other | Source: Ambulatory Visit | Attending: Internal Medicine | Admitting: Internal Medicine

## 2013-02-10 DIAGNOSIS — Z431 Encounter for attention to gastrostomy: Secondary | ICD-10-CM | POA: Insufficient documentation

## 2013-02-10 DIAGNOSIS — R633 Feeding difficulties: Secondary | ICD-10-CM

## 2013-02-10 NOTE — Procedures (Signed)
Successful removal of gastrostomy.  No immediate complication.

## 2013-02-23 ENCOUNTER — Non-Acute Institutional Stay (SKILLED_NURSING_FACILITY): Payer: Medicaid Other | Admitting: Internal Medicine

## 2013-02-23 DIAGNOSIS — G7000 Myasthenia gravis without (acute) exacerbation: Secondary | ICD-10-CM

## 2013-02-23 DIAGNOSIS — K219 Gastro-esophageal reflux disease without esophagitis: Secondary | ICD-10-CM

## 2013-02-23 DIAGNOSIS — G4700 Insomnia, unspecified: Secondary | ICD-10-CM

## 2013-02-23 DIAGNOSIS — F22 Delusional disorders: Secondary | ICD-10-CM

## 2013-02-23 NOTE — Progress Notes (Signed)
PROGRESS NOTE  DATE: 02-23-13  FACILITY: Nursing Home Location: [facility]  LEVEL OF CARE: SNF (31)  Routine Visit  CHIEF COMPLAINT:  Manage insomnia & myesthenia gravis  HISTORY OF PRESENT ILLNESS:  REASSESSMENT OF ONGOING PROBLEM(S):  INSOMNIA: The insomnia remains stable.  No complications noted from the medications presently being used. Patient denies ongoing insomnia, pain, hallucinations, delusions.  MYESTHENIA GRAVIS: pts myesthenia gravis is stable.  He denies progression of sx recently.  No complications from the medications currently being used.  PAST MEDICAL HISTORY : Reviewed.  No changes.  CURRENT MEDICATIONS: Reviewed per [facility]  REVIEW OF SYSTEMS:  GENERAL: no change in appetite, no fatigue, no weight changes, no fever, chills or weakness RESPIRATORY: no cough, SOB, DOE, wheezing, hemoptysis CARDIAC: no chest pain, edema or palpitations GI: no abdominal pain, diarrhea, constipation, heart burn, nausea or vomiting  PHYSICAL EXAMINATION  VS:  T 98.3      P 80      RR 18      BP 100/60     POX %     WT (Lb) 193  GENERAL: no acute distress, normal body habitus NECK: supple, trachea midline, no neck masses, no thyroid tenderness, no thyromegaly RESPIRATORY: breathing is even & unlabored, BS CTAB CARDIAC: RRR, no murmur,no extra heart sounds, no edema GI: abdomen soft, normal BS, no masses, no tenderness, no hepatomegaly, no splenomegaly PSYCHIATRIC: the patient is alert & oriented to person, affect & behavior appropriate  LABS/RADIOLOGY:  8-14 liver profile normal, hemoglobin A1c 5.7  4/14 bmp nl 3/14 cbc nl, AST 50, ALT 96 ow cmp nl  ASSESSMENT/PLAN:  Myesthenia gravis-stable. Insomnia-denies on going sx. GERD-stable. Delusional d/o-stable. Check CBC  CPT CODE: 47829

## 2013-03-23 ENCOUNTER — Non-Acute Institutional Stay (SKILLED_NURSING_FACILITY): Payer: Medicaid Other | Admitting: Internal Medicine

## 2013-03-23 DIAGNOSIS — F22 Delusional disorders: Secondary | ICD-10-CM

## 2013-03-23 DIAGNOSIS — G4700 Insomnia, unspecified: Secondary | ICD-10-CM

## 2013-03-23 DIAGNOSIS — G7000 Myasthenia gravis without (acute) exacerbation: Secondary | ICD-10-CM

## 2013-03-23 DIAGNOSIS — K219 Gastro-esophageal reflux disease without esophagitis: Secondary | ICD-10-CM

## 2013-03-25 NOTE — Progress Notes (Signed)
PROGRESS NOTE  DATE: 03-23-13  FACILITY: Nursing Home [facility]: [facility]  LEVEL OF CARE: SNF (31)  Routine Visit  CHIEF COMPLAINT:  Manage insomnia & myesthenia gravis  HISTORY OF PRESENT ILLNESS:  REASSESSMENT OF ONGOING PROBLEM(S):  INSOMNIA: The insomnia remains stable.  No complications noted from the medications presently being used. Patient denies ongoing insomnia, pain, hallucinations, delusions.  MYESTHENIA GRAVIS: pts myesthenia gravis is stable.  He denies progression of sx recently.  No complications from the medications currently being used.  PAST MEDICAL HISTORY : Reviewed.  No changes.  CURRENT MEDICATIONS: Reviewed per [facility]  REVIEW OF SYSTEMS:  GENERAL: no change in appetite, no fatigue, no weight changes, no fever, chills or weakness RESPIRATORY: no cough, SOB, DOE, wheezing, hemoptysis CARDIAC: no chest pain, edema or palpitations GI: no abdominal pain, diarrhea, constipation, heart burn, nausea or vomiting  PHYSICAL EXAMINATION  VS:  T 98.2      P 70      RR 18      BP 100/60     POX %     WT (Lb) 171  GENERAL: no acute distress, normal body habitus NECK: supple, trachea midline, no neck masses, no thyroid tenderness, no thyromegaly RESPIRATORY: breathing is even & unlabored, BS CTAB CARDIAC: RRR, no murmur,no extra heart sounds, no edema GI: abdomen soft, normal BS, no masses, no tenderness, no hepatomegaly, no splenomegaly PSYCHIATRIC: the patient is alert & oriented to person, affect & behavior appropriate  LABS/RADIOLOGY:  9/14 cbc nl  8-14 liver profile normal, hemoglobin A1c 5.7  4/14 bmp nl 3/14 cbc nl, AST 50, ALT 96 ow cmp nl  ASSESSMENT/PLAN:  Myesthenia gravis-stable. Insomnia-denies on going sx. GERD-stable. Delusional d/o-stable.  CPT CODE: 16109

## 2013-04-28 ENCOUNTER — Non-Acute Institutional Stay (SKILLED_NURSING_FACILITY): Payer: Medicaid Other | Admitting: Internal Medicine

## 2013-04-28 ENCOUNTER — Encounter: Payer: Self-pay | Admitting: Internal Medicine

## 2013-04-28 DIAGNOSIS — G7000 Myasthenia gravis without (acute) exacerbation: Secondary | ICD-10-CM

## 2013-04-28 DIAGNOSIS — G4700 Insomnia, unspecified: Secondary | ICD-10-CM

## 2013-04-28 DIAGNOSIS — K219 Gastro-esophageal reflux disease without esophagitis: Secondary | ICD-10-CM

## 2013-04-28 DIAGNOSIS — F22 Delusional disorders: Secondary | ICD-10-CM

## 2013-04-28 NOTE — Progress Notes (Signed)
PROGRESS NOTE  DATE: 04-28-13  FACILITY: Nursing Home Location: [facility]  LEVEL OF CARE: SNF (31)  Routine Visit  CHIEF COMPLAINT:  Manage insomnia & myesthenia gravis  HISTORY OF PRESENT ILLNESS:  REASSESSMENT OF ONGOING PROBLEM(S):  INSOMNIA: The insomnia remains stable.  No complications noted from the medications presently being used. Patient denies ongoing insomnia, pain, hallucinations, delusions.  MYESTHENIA GRAVIS: pts myesthenia gravis is stable.  He denies progression of sx recently.  No complications from the medications currently being used.  PAST MEDICAL HISTORY : Reviewed.  No changes.  CURRENT MEDICATIONS: Reviewed per [facility]  REVIEW OF SYSTEMS:  GENERAL: no change in appetite, no fatigue, no weight changes, no fever, chills or weakness RESPIRATORY: no cough, SOB, DOE, wheezing, hemoptysis CARDIAC: no chest pain, edema or palpitations GI: no abdominal pain, diarrhea, constipation, heart burn, nausea or vomiting  PHYSICAL EXAMINATION  VS:  T 98.6      P 70      RR 18      BP 130/70     POX %     WT (Lb) 175  GENERAL: no acute distress, normal body habitus NECK: supple, trachea midline, no neck masses, no thyroid tenderness, no thyromegaly RESPIRATORY: breathing is even & unlabored, BS CTAB CARDIAC: RRR, no murmur,no extra heart sounds, no edema GI: abdomen soft, normal BS, no masses, no tenderness, no hepatomegaly, no splenomegaly PSYCHIATRIC: the patient is alert & oriented to person, affect & behavior appropriate  LABS/RADIOLOGY:  9/14 cbc nl  8-14 liver profile normal, hemoglobin A1c 5.7  4/14 bmp nl 3/14 cbc nl, AST 50, ALT 96 ow cmp nl  ASSESSMENT/PLAN:  Myesthenia gravis-stable. Insomnia-denies on going sx. GERD-stable. Delusional d/o-stable. Check BMP  CPT CODE: 16109

## 2013-05-13 NOTE — Progress Notes (Signed)
Patient ID: Victor Durham, male   DOB: [birth year], 57 y.o.   MRN: [id number]     [facility]  No Known Allergies  Chief Complaint  Patient presents with  . Medical Managment of Chronic Issues    HPI He is being seen for the management of his chronic illnesses. He has a shingles rash on his right rib cage area which is causing him some itching and pain. He states that he has had the rash for a couple of days.   Past Medical History  Diagnosis Date  . DVT (deep venous thrombosis)   . Hypertension   . High cholesterol   . Myasthenia gravis   . Anxiety   . Dysphagia 06/30/12    Past Surgical History  Procedure Laterality Date  . Esophagogastroduodenoscopy  07/11/2012    Procedure: ESOPHAGOGASTRODUODENOSCOPY (EGD);  Surgeon: Shirley Friar, MD;  Location: [facility];  Service: Endoscopy;  Laterality: N/A;  BEDSIDE  . Tracheostomy N/A   . Tee without cardioversion N/A 09/09/2012    Procedure: TRANSESOPHAGEAL ECHOCARDIOGRAM (TEE);  Surgeon: Dolores Patty, MD;  Location: [facility] ENDOSCOPY;  Service: Cardiovascular;  Laterality: N/A;    Filed Vitals:   11/13/12 1451  BP: 127/68  Pulse: 70  Height: 6\' 1"  (1.854 m)  Weight: 153 lb (69.4 kg)    MEDICATIONS  Asa 81 mg daily Prednisone 40 mg dialy protonix 40 mg dialy mvi dialy Vit c twice daily Peg tube feeding prostat twice daily Iron twice dialy risperdal 1 mg twice daily Mestinon 90 mg 5 times daily Trazodone 100 mg daily Coumadin 7 mg daily Albuterol neb every 8 hours Xanax 0.5 mg every 3 hours as needed   LABS REVIEWED:  08-13-12: wbc 7.4; hgb 13.6; hct 42.4; mcv 93 ;plt 288; glucose 94; bun 24; creat 0.70; k+4.0; na++136 liver normal albumin 4.2 09-30-12: glucose 76; bun 15; creat 0.54; k+4.0; na++ 142    Review of Systems  Constitutional: Negative for malaise/fatigue.  Respiratory: Negative for cough and shortness of breath.   Cardiovascular: Negative for chest pain.  Gastrointestinal: Negative  for heartburn, abdominal pain and constipation.  Musculoskeletal: Negative for myalgias.  Skin: Positive for itching and rash.       Has rash on right rib cage area  Neurological: Positive for weakness. Negative for headaches.  Psychiatric/Behavioral: Negative for depression. The patient is not nervous/anxious.      Physical Exam  Constitutional: He appears well-developed and well-nourished. No distress.  Neck: Neck supple. No JVD present. No thyromegaly present.  Cardiovascular: Normal rate, regular rhythm and intact distal pulses.   Respiratory: Effort normal and breath sounds normal. No respiratory distress. He has no wheezes.  GI: Soft. Bowel sounds are normal. He exhibits no distension. There is no tenderness.  Has peg tube present   Musculoskeletal: He exhibits no edema.  Neurological: He is alert.  Skin: Skin is warm. Rash noted. He is not diaphoretic.  Has shingles rash present on right upper rib cage area      ASSESSMENT/PLAN  1. Myasthenia gravis: without change in status; remain on peg tube feedings due to dysphagia. Will continue mestinon 90 mg 5 times daily; prednisone 40 mg daily  and will continue to monitor his status.   2. Dysphagia: no recent aspiration present; will continue peg tube feedings; will continue to monitor his status  3. Anemia: will lower his iron to one time daily and will monitor   4. Shingles: will being valtrex 1 gm three times daily  for one week and will continue to monitor his status   5. Dvt: his coumadin is being followed by pharmacy; will not make changes and will monitor his status   6. Gerd: will continue protonix 40 mg daily   7. Delusional disorder; is presently stable will continue his risperdal 2 mg twice daily and will continue xanax 0.5 mg every 3 hours as needed for anxiety and will monitor his status   8. Insomnia: will continue trazodone 100 mg nightly   9. Chronic respiratory failure: will continue albuterol neb treatments  every 8 hours; is presently stable.

## 2013-05-19 NOTE — Progress Notes (Signed)
Patient ID: Victor Durham, male   DOB: [birth year], 57 y.o.   MRN: [id number]      [facility]  No Known Allergies  Chief Complaint  Patient presents with  . Medical Managment of Chronic Issues    HPI He is being seen for the management of his chronic illnesses. He has been seen by psych services for his delusional disorder and his risperdal was adjusted. There are no concerns being voiced by the nursing staff at this time. He continues to require peg tube feeding.   Past Medical History  Diagnosis Date  . DVT (deep venous thrombosis)   . Hypertension   . High cholesterol   . Myasthenia gravis   . Anxiety   . Dysphagia 06/30/12    Past Surgical History  Procedure Laterality Date  . Esophagogastroduodenoscopy  07/11/2012    Procedure: ESOPHAGOGASTRODUODENOSCOPY (EGD);  Surgeon: Shirley Friar, MD;  Location: [facility];  Service: Endoscopy;  Laterality: N/A;  BEDSIDE  . Tracheostomy N/A   . Tee without cardioversion N/A 09/09/2012    Procedure: TRANSESOPHAGEAL ECHOCARDIOGRAM (TEE);  Surgeon: Dolores Patty, MD;  Location: [facility] ENDOSCOPY;  Service: Cardiovascular;  Laterality: N/A;   Filed Vitals:   12/18/12 0957  BP: 106/70  Pulse: 58  Height: 6' (1.829 m)  Weight: 166 lb (75.297 kg)    MEDICATIONS  Asa 81 mg daily Prednisone 40 mg daily protonix 30 mg daily Imuran 100 mg daily  mvi dialy Vit c twice daily Peg tube feeding prostat twice daily Iron twice daily risperdal 1 mg twice daily risp con 25 mg im every 2 weeks.  Mestinon 90 mg 5 times daily Trazodone 100 mg daily Coumadin 7 mg daily Albuterol neb every 8 hours Xanax 0.5 mg every 3 hours as needed   LABS REVIEWED:  08-13-12: wbc 7.4; hgb 13.6; hct 42.4; mcv 93 ;plt 288; glucose 94; bun 24; creat 0.70; k+4.0; na++136 liver normal albumin 4.2 09-30-12: glucose 76; bun 15; creat 0.54; k+4.0; na++ 142    Review of Systems  Constitutional: Negative for malaise/fatigue.  Respiratory:  Negative for cough and shortness of breath.   Cardiovascular: Negative for chest pain.  Gastrointestinal: Negative for heartburn, abdominal pain and constipation.  Musculoskeletal: Negative for myalgias.  Skin: negative for rash or itching  Neurological: Positive for weakness. Negative for headaches.  Psychiatric/Behavioral: Negative for depression. The patient is not nervous/anxious.      Physical Exam  Constitutional: He appears well-developed and well-nourished. No distress.  Neck: Neck supple. No JVD present. No thyromegaly present.  Cardiovascular: Normal rate, regular rhythm and intact distal pulses.   Respiratory: Effort normal and breath sounds normal. No respiratory distress. He has no wheezes.  GI: Soft. Bowel sounds are normal. He exhibits no distension. There is no tenderness.  Has peg tube present   Musculoskeletal: He exhibits no edema.  Neurological: He is alert.  Skin: Skin is warm not rash  He is not diaphoretic.    ASSESSMENT/PLAN  1. Myasthenia gravis: without change in status; remain on peg tube feedings due to dysphagia. Will continue mestinon 90 mg 5 times daily; prednisone 30 mg daily; imuran 100 mg daily   and will continue to monitor his status.   2. Dysphagia: no recent aspiration present; will continue peg tube feedings; will continue to monitor his status  3. Anemia: will continue iron twice daily    4. Dvt: his coumadin is being followed by pharmacy; will not make changes and will monitor his status  5. Gerd: will continue protonix 40 mg daily   6. Delusional disorder; is presently stable will continue his risperdal 1 mg twice daily and will continue xanax 0.5 mg every 3 hours as needed for anxiety will continue his risperdal consta 25 mg every 2 weeks and will monitor his status   7. Insomnia: will continue trazodone 100 mg nightly   8. Chronic respiratory failure: will continue albuterol neb treatments every 8 hours; is presently stable.

## 2013-05-26 ENCOUNTER — Non-Acute Institutional Stay (SKILLED_NURSING_FACILITY): Payer: Medicaid Other | Admitting: Internal Medicine

## 2013-05-26 ENCOUNTER — Encounter: Payer: Self-pay | Admitting: Internal Medicine

## 2013-05-26 DIAGNOSIS — F22 Delusional disorders: Secondary | ICD-10-CM

## 2013-05-26 DIAGNOSIS — G4700 Insomnia, unspecified: Secondary | ICD-10-CM

## 2013-05-26 DIAGNOSIS — K219 Gastro-esophageal reflux disease without esophagitis: Secondary | ICD-10-CM

## 2013-05-26 DIAGNOSIS — G7000 Myasthenia gravis without (acute) exacerbation: Secondary | ICD-10-CM

## 2013-05-26 NOTE — Progress Notes (Signed)
PROGRESS NOTE  DATE: 05-26-13  FACILITY: Nursing Home Location: [facility]  LEVEL OF CARE: SNF (31)  Routine Visit  CHIEF COMPLAINT:  Manage insomnia & myesthenia gravis  HISTORY OF PRESENT ILLNESS:  REASSESSMENT OF ONGOING PROBLEM(S):  INSOMNIA: The insomnia remains stable.  No complications noted from the medications presently being used. Patient denies ongoing insomnia, pain, hallucinations, delusions.  MYESTHENIA GRAVIS: pts myesthenia gravis is stable.  He denies progression of sx recently.  No complications from the medications currently being used.  PAST MEDICAL HISTORY : Reviewed.  No changes.  CURRENT MEDICATIONS: Reviewed per [facility]  REVIEW OF SYSTEMS:  GENERAL: no change in appetite, no fatigue, no weight changes, no fever, chills or weakness RESPIRATORY: no cough, SOB, DOE, wheezing, hemoptysis CARDIAC: no chest pain, edema or palpitations GI: no abdominal pain, diarrhea, constipation, heart burn, nausea or vomiting  PHYSICAL EXAMINATION  VS:  T 98.6      P 65      RR 18      BP 110/60     POX %     WT (Lb) 180  GENERAL: no acute distress, normal body habitus NECK: supple, trachea midline, no neck masses, no thyroid tenderness, no thyromegaly RESPIRATORY: breathing is even & unlabored, BS CTAB CARDIAC: RRR, no murmur,no extra heart sounds, no edema GI: abdomen soft, normal BS, no masses, no tenderness, no hepatomegaly, no splenomegaly PSYCHIATRIC: the patient is alert & oriented to person, affect & behavior appropriate  LABS/RADIOLOGY: 11-14 BMP normal 9/14 cbc nl  8-14 liver profile normal, hemoglobin A1c 5.7  4/14 bmp nl 3/14 cbc nl, AST 50, ALT 96 ow cmp nl  ASSESSMENT/PLAN:  Myesthenia gravis-stable. Insomnia-denies on going sx. GERD-stable. Delusional d/o-stable.  CPT CODE: 30865

## 2013-06-23 ENCOUNTER — Non-Acute Institutional Stay (SKILLED_NURSING_FACILITY): Payer: Medicaid Other | Admitting: Internal Medicine

## 2013-06-23 DIAGNOSIS — F22 Delusional disorders: Secondary | ICD-10-CM

## 2013-06-23 DIAGNOSIS — K219 Gastro-esophageal reflux disease without esophagitis: Secondary | ICD-10-CM

## 2013-06-23 DIAGNOSIS — G7000 Myasthenia gravis without (acute) exacerbation: Secondary | ICD-10-CM

## 2013-06-23 DIAGNOSIS — G4700 Insomnia, unspecified: Secondary | ICD-10-CM

## 2013-06-23 NOTE — Progress Notes (Signed)
        PROGRESS NOTE  DATE: 06-23-13  FACILITY: Nursing Home [facility]  LEVEL OF CARE: SNF (31)  Routine Visit  CHIEF COMPLAINT:  Manage insomnia & myesthenia gravis  HISTORY OF PRESENT ILLNESS:  REASSESSMENT OF ONGOING PROBLEM(S):  INSOMNIA: The insomnia remains stable.  No complications noted from the medications presently being used. Patient denies ongoing insomnia, pain, hallucinations, delusions.  MYESTHENIA GRAVIS: pts myesthenia gravis is stable.  He denies progression of sx recently.  No complications from the medications currently being used.  PAST MEDICAL HISTORY : Reviewed.  No changes.  [facility]  REVIEW OF SYSTEMS:  GENERAL: no change in appetite, no fatigue, no weight changes, no fever, chills or weakness RESPIRATORY: no cough, SOB, DOE, wheezing, hemoptysis CARDIAC: no chest pain, edema or palpitations GI: no abdominal pain, diarrhea, constipation, heart burn, nausea or vomiting  PHYSICAL EXAMINATION  VS:  T 98.7      P 56      RR 20      BP 126/82     POX %     WT (Lb) 185  GENERAL: no acute distress, normal body habitus NECK: supple, trachea midline, no neck masses, no thyroid tenderness, no thyromegaly RESPIRATORY: breathing is even & unlabored, BS CTAB CARDIAC: RRR, no murmur,no extra heart sounds, no edema GI: abdomen soft, normal BS, no masses, no tenderness, no hepatomegaly, no splenomegaly PSYCHIATRIC: the patient is alert & oriented to person, affect & behavior appropriate  LABS/RADIOLOGY: 11-14 BMP normal 9/14 cbc nl  8-14 liver profile normal, hemoglobin A1c 5.7  4/14 bmp nl 3/14 cbc nl, AST 50, ALT 96 ow cmp nl  ASSESSMENT/PLAN:  Myesthenia gravis-stable. Insomnia-denies on going sx. GERD-stable. Delusional d/o-stable.  CPT CODE: [id number]

## 2013-07-13 ENCOUNTER — Encounter: Payer: Self-pay | Admitting: *Deleted

## 2013-07-14 ENCOUNTER — Non-Acute Institutional Stay (SKILLED_NURSING_FACILITY): Payer: Medicaid Other | Admitting: Internal Medicine

## 2013-07-14 DIAGNOSIS — G4700 Insomnia, unspecified: Secondary | ICD-10-CM

## 2013-07-14 DIAGNOSIS — G7000 Myasthenia gravis without (acute) exacerbation: Secondary | ICD-10-CM

## 2013-07-14 DIAGNOSIS — D696 Thrombocytopenia, unspecified: Secondary | ICD-10-CM

## 2013-07-14 DIAGNOSIS — K219 Gastro-esophageal reflux disease without esophagitis: Secondary | ICD-10-CM

## 2013-07-22 ENCOUNTER — Encounter: Payer: Self-pay | Admitting: Family

## 2013-07-22 ENCOUNTER — Non-Acute Institutional Stay (SKILLED_NURSING_FACILITY): Payer: Medicaid Other | Admitting: Family

## 2013-07-22 DIAGNOSIS — M545 Low back pain, unspecified: Secondary | ICD-10-CM

## 2013-07-22 DIAGNOSIS — G4700 Insomnia, unspecified: Secondary | ICD-10-CM

## 2013-07-22 DIAGNOSIS — G7000 Myasthenia gravis without (acute) exacerbation: Secondary | ICD-10-CM

## 2013-07-22 DIAGNOSIS — F22 Delusional disorders: Secondary | ICD-10-CM

## 2013-07-22 NOTE — Progress Notes (Signed)
Patient ID: Victor Durham, male   DOB: [birth year], 58 y.o.   MRN: [id number]  Date: 07/22/13 [facility]  Code Status:  Full Code  Chief Complaint  Patient presents with  . Medical Managment of Chronic Issues    Routine Visit  . Acute Visit    Back Pain    HPI: Pt presents for the medical management of chronic illnesses.  Pt further reports lower back pain x 3 days. Pt describes pain as a constant spasm that is aggravated with movement and position change, specifically standing.  Pt denies trauma or falls.  Pt reports experiencing back spasms of this quality months ago.  Pt endorses pain/spasm is not relieved with prn Tylenol.  No further issues/concerns expressed by health care team or pt.      No Known Allergies    Medication List       This list is accurate as of: 07/22/13  2:09 PM.  Always use your most recent med list.               albuterol (5 MG/ML) 0.5% nebulizer solution  Commonly known as:  PROVENTIL  Take 3 mLs by nebulization every 8 (eight) hours. Scheduled dose, trach care     aspirin 81 MG chewable tablet  Give 81 mg by tube daily.     azaTHIOprine 50 MG tablet  Commonly known as:  IMURAN  Take 2 tablets (100 mg total) by mouth daily.     chlorhexidine 0.12 % solution  Commonly known as:  PERIDEX  - Use as directed 15 mLs in the mouth or throat 6 (six) times daily. Swab mouth at 10am and 10pm  - Rinse mouth 0600, 1200, 1800, and 0001     ferrous sulfate 220 (44 FE) MG/5ML solution  Take 330 mg by mouth daily.     FIBERSOURCE HN Liqd  Give 70 mL/hr by tube continuous.     predniSONE 20 MG tablet  Commonly known as:  DELTASONE  Place 30 mg into feeding tube daily with breakfast.     PROTONIX 40 mg/20 mL Pack  Generic drug:  pantoprazole sodium  Place 40 mg into feeding tube daily.     pyridostigmine 60 MG/5ML syrup  Commonly known as:  MESTINON  Give 90 mg by tube 5 (five) times daily. 7.295ml     risperiDONE 2 MG  disintegrating tablet  Commonly known as:  RISPERDAL M-TABS  Take 2 mg by mouth 2 (two) times daily.     sterile water for irrigation  Give 200 mLs by tube every 4 (four) hours.     traZODone 100 MG tablet  Commonly known as:  DESYREL  Give 100 mg by tube at bedtime.     vitamin C 500 MG tablet  Commonly known as:  ASCORBIC ACID  Take 500 mg by mouth daily.     zinc sulfate 220 MG capsule  Take 220 mg by mouth daily.         DATA REVIEWED   Laboratory Studies: 07/13/13- WBC 5.9, Hemoglobin 15.4, Hematocrit 44.5, Platelets 137   Past Medical History  Diagnosis Date  . DVT (deep venous thrombosis)   . Hypertension   . High cholesterol   . Myasthenia gravis   . Anxiety   . Dysphagia 06/30/12   Past Surgical History  Procedure Laterality Date  . Esophagogastroduodenoscopy  07/11/2012    Procedure: ESOPHAGOGASTRODUODENOSCOPY (EGD);  Surgeon: Shirley FriarVincent C. Schooler, MD;  [facility];  Service:  Endoscopy;  Laterality: N/A;  BEDSIDE  . Tracheostomy N/A   . Tee without cardioversion N/A 09/09/2012    Procedure: TRANSESOPHAGEAL ECHOCARDIOGRAM (TEE);  Surgeon: Dolores Patty, MD;  Location: [facility] ENDOSCOPY;  Service: Cardiovascular;  Laterality: N/A;   Review of Systems  Constitutional: Negative.   Respiratory: Negative.   Cardiovascular: Negative.   Gastrointestinal: Negative.   Genitourinary: Negative.   Musculoskeletal: Positive for back pain.  Skin: Negative.   Neurological: Negative.   Psychiatric/Behavioral: Negative.      Physical Exam Filed Vitals:   07/22/13 1350  BP: 116/76  Pulse: 98  Temp: 98.3 F (36.8 C)  Resp: 18   Physical Exam  Constitutional: He is oriented to person, place, and time.  Cardiovascular: Normal rate and regular rhythm.   Pulmonary/Chest: Effort normal and breath sounds normal.  Musculoskeletal:       Lumbar back: He exhibits tenderness, pain and spasm. He exhibits no swelling and no edema.  Neurological: He is alert and  oriented to person, place, and time.  Skin: Skin is warm, dry and intact.    ASSESSMENT/PLAN  1. Lumbago- Obtain U/A C&S, PT evaluation, Voltaren gel 0.1% 2 gm apply qid prn to lower back ; Xray L-spine- pt on chronic steroid therapy/ r/o degenerative dz 2. Myasthenia Gravis-will cont Imuran, Prednisone and Mestinon; pt is currently stable 3. Delusional Disorder- will cont Risperdal; pt is currently stable 4. Insomnia-will cont Trazadone; pt is stable   Follow up:prn Time Spent: 1 hour

## 2013-07-23 DIAGNOSIS — D696 Thrombocytopenia, unspecified: Secondary | ICD-10-CM | POA: Insufficient documentation

## 2013-07-23 NOTE — Progress Notes (Signed)
         PROGRESS NOTE  DATE: 07-14-13  FACILITY: Nursing Home [facility]  LEVEL OF CARE: SNF (31)  Routine Visit  CHIEF COMPLAINT:  Manage thrombocytopenia, insomnia & myesthenia gravis  HISTORY OF PRESENT ILLNESS:  REASSESSMENT OF ONGOING PROBLEM(S):  THROMBOCYTOPENIA: New problem. On 2 -3-15 platelets 137. In 9-14 platelets 164. Patient denies easy bruising or bleeding.  INSOMNIA: The insomnia remains stable.  No complications noted from the medications presently being used. Patient denies ongoing insomnia, pain, hallucinations, delusions.  MYESTHENIA GRAVIS: pts myesthenia gravis is stable.  He denies progression of sx recently.  No complications from the medications currently being used.  PAST MEDICAL HISTORY : Reviewed.  No changes.  CURRENT MEDICATIONS: [facility]  REVIEW OF SYSTEMS:  GENERAL: no change in appetite, no fatigue, no weight changes, no fever, chills or weakness RESPIRATORY: no cough, SOB, DOE, wheezing, hemoptysis CARDIAC: no chest pain, edema or palpitations GI: no abdominal pain, diarrhea, constipation, heart burn, nausea or vomiting  PHYSICAL EXAMINATION  VS:  T 99      P 82      RR 18      BP 138/86      WT (Lb) 192  GENERAL: no acute distress, normal body habitus NECK: supple, trachea midline, no neck masses, no thyroid tenderness, no thyromegaly RESPIRATORY: breathing is even & unlabored, BS CTAB CARDIAC: RRR, no murmur,no extra heart sounds, no edema GI: abdomen soft, normal BS, no masses, no tenderness, no hepatomegaly, no splenomegaly PSYCHIATRIC: the patient is alert & oriented to person, affect & behavior appropriate  LABS/RADIOLOGY: 2-15 platelets 137 otherwise CBC normal, total protein 5.8 otherwise liver profile normal 11-14 BMP normal 9/14 cbc nl  8-14 liver profile normal, hemoglobin A1c 5.7  4/14 bmp nl 3/14 cbc nl, AST 50, ALT 96 ow cmp nl  ASSESSMENT/PLAN:  Myesthenia  gravis-stable. Insomnia-denies on going sx. GERD-stable. Delusional d/o-stable. Thrombocytopenia-new problem. Patient is asymptomatic. Patient is on aspirin. Will monitor. Check hemoglobin A1c  CPT CODE: [id number]

## 2013-07-27 IMAGING — CR DG CHEST 1V PORT
1 series · 1 of 1 positions shown · non-contrast
Comparison: 07/27/2012

CLINICAL DATA: Aspiration pneumonia.

PORTABLE CHEST - 1 VIEW

[AP]
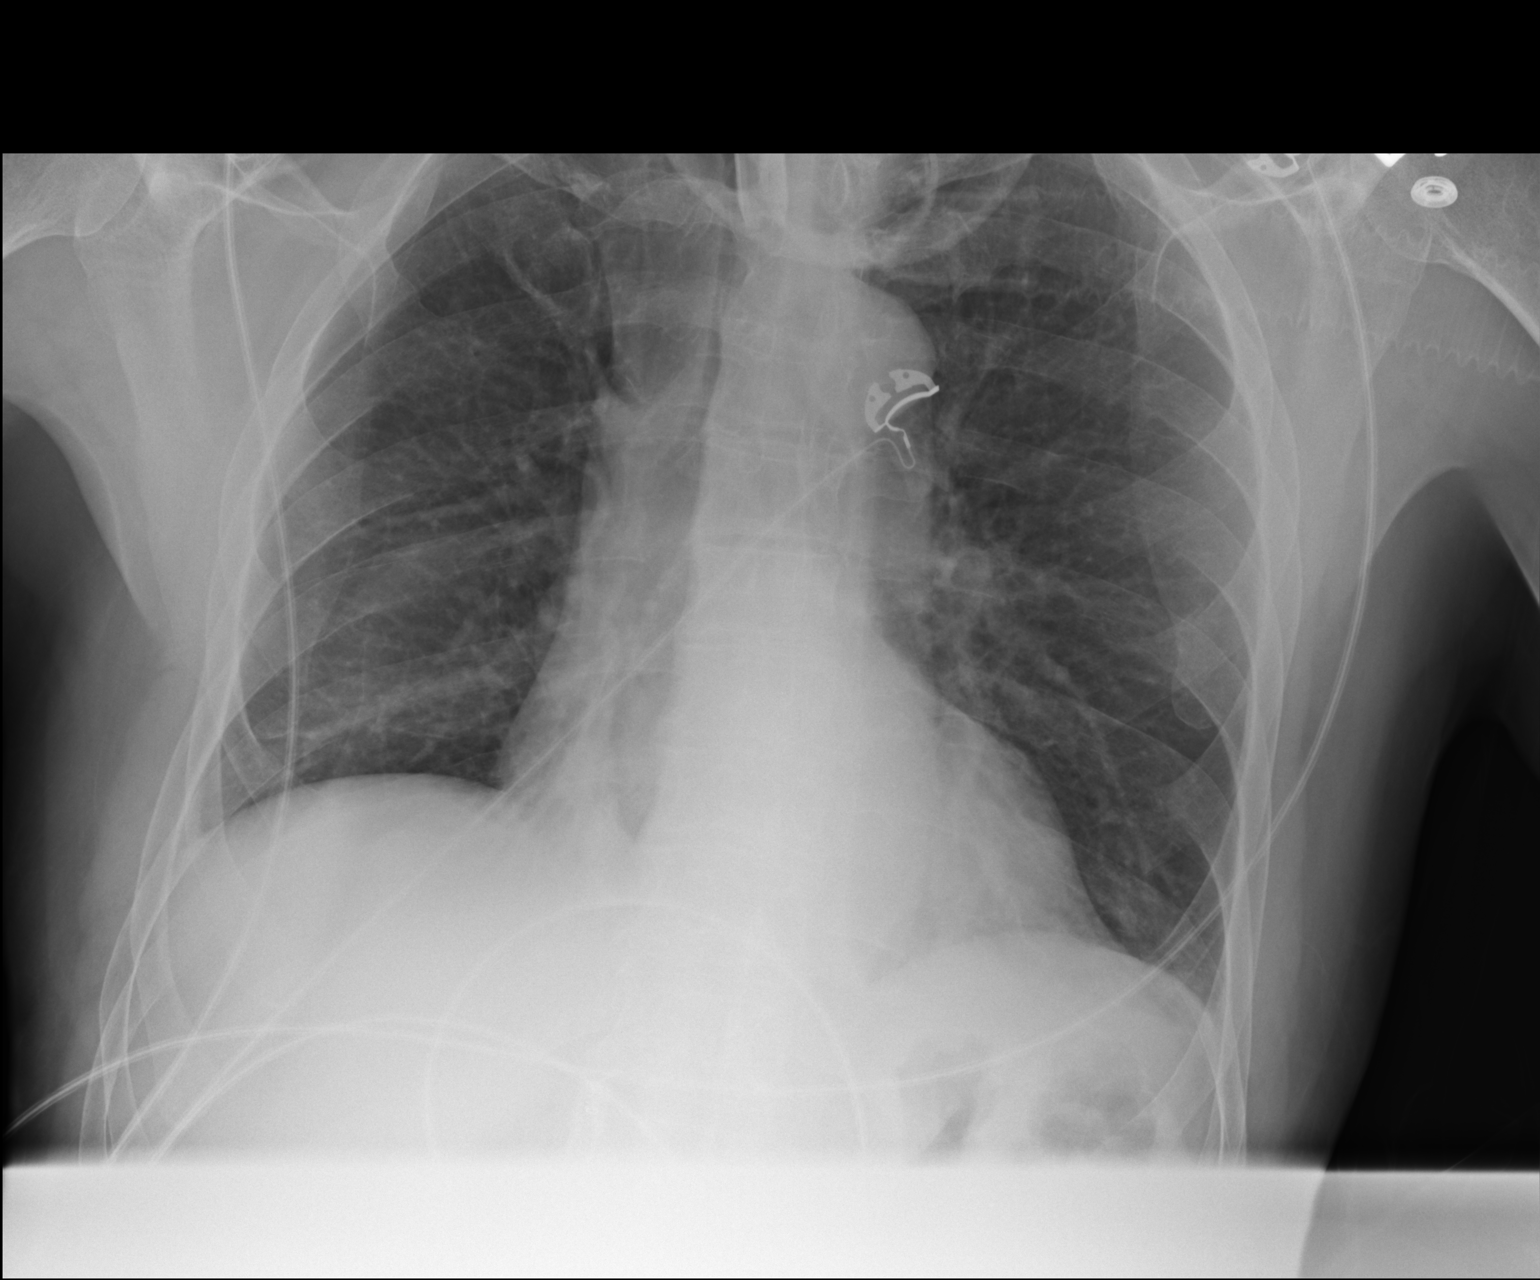

[1 of 1 positions shown; findings below may reference images not displayed]

FINDINGS: Tracheostomy unchanged in position.  Support apparatus
projects over the apices.  Mild cardiomegaly.  Mild right
hemidiaphragm elevation. No pleural effusion or pneumothorax.
Improved right base aeration.  Mild volume loss and atelectasis are
identified medially.  Minimal left base volume loss as well.
IMPRESSION: Resolved infection/aspiration at the right lung base.  Mild volume
loss or atelectasis remaining.

## 2013-07-31 IMAGING — CR DG CHEST 1V PORT
1 series · 1 of 1 positions shown · non-contrast
Comparison: 08/03/2012

CLINICAL DATA: Shortness of breath.  Tracheostomy.

PORTABLE CHEST - 1 VIEW

[AP]
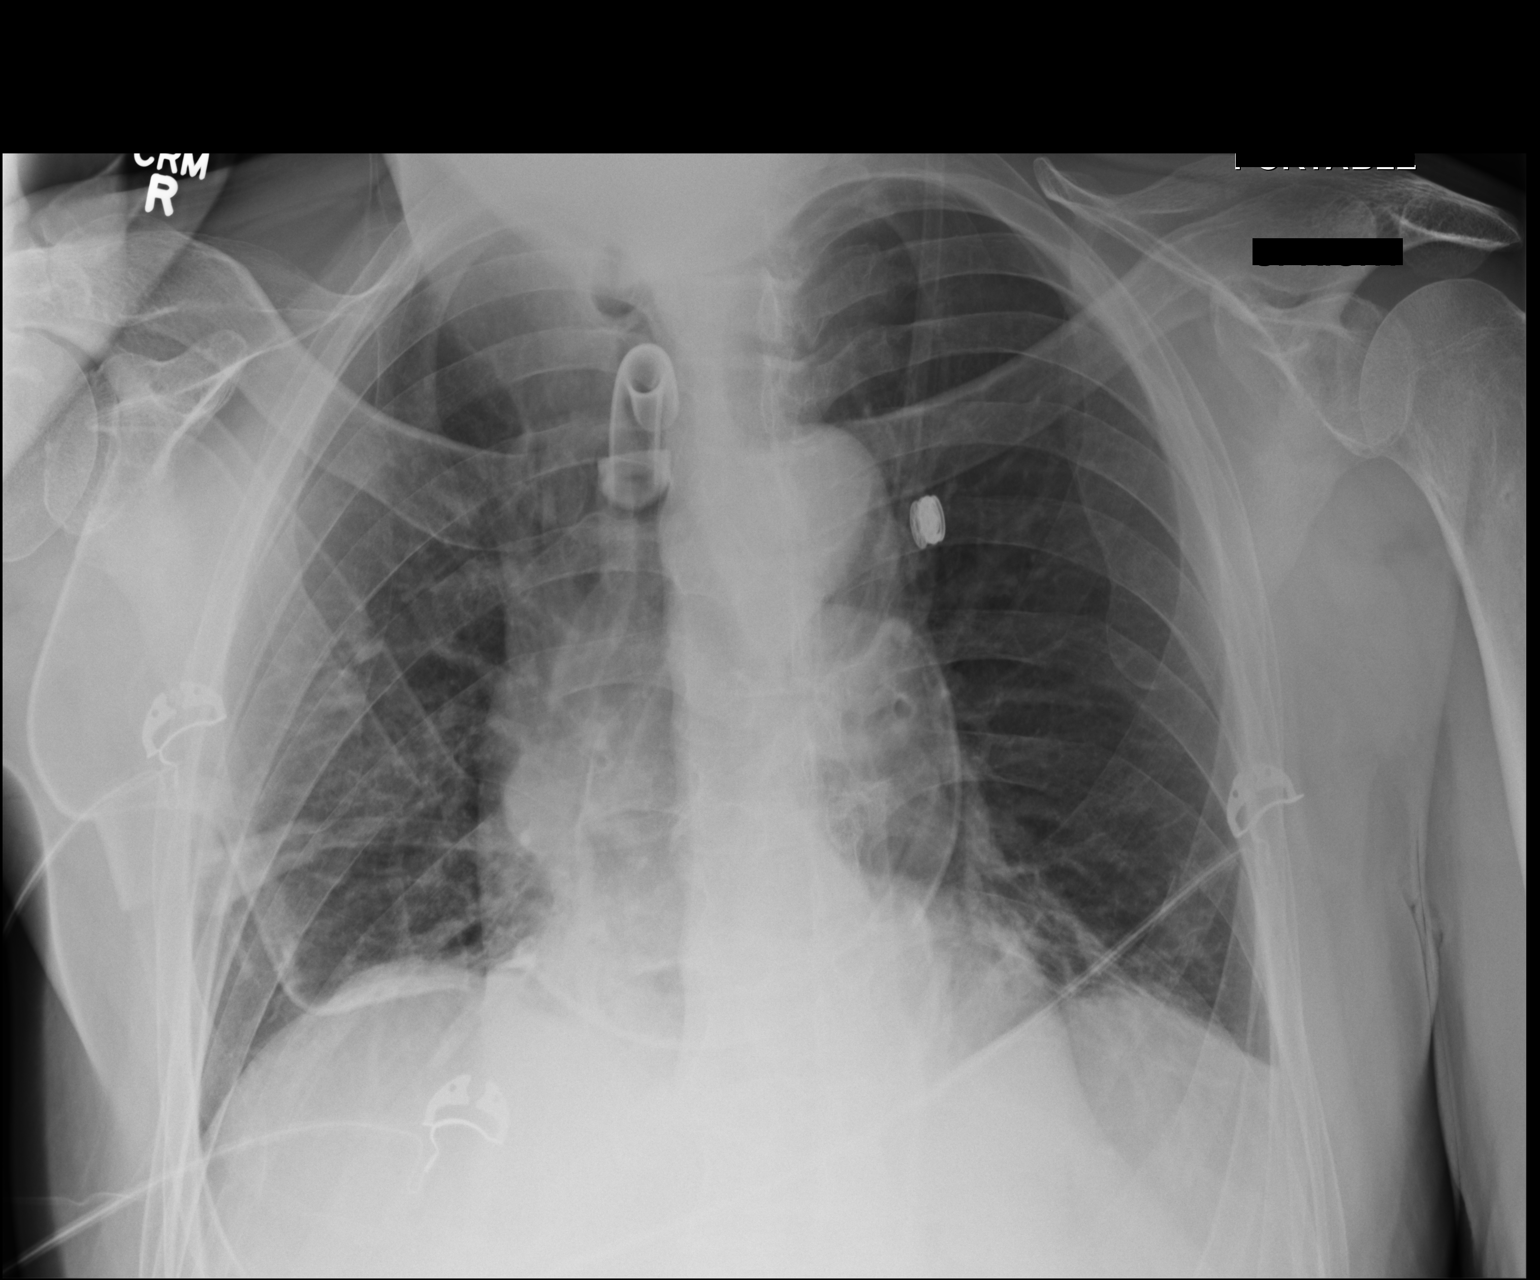

[1 of 1 positions shown; findings below may reference images not displayed]

FINDINGS: Patient positioning suggest kyphosis resulting in reverse
lordotic appearance.  Tracheostomy tube is visualized but location
cannot be determined based on technique.  The heart size and
pulmonary vascularity look normal.  There is a residual linear
infiltration or atelectasis in the lung bases bilaterally.  No
blunting of costophrenic angles.  No pneumothorax.
IMPRESSION: Linear infiltration or atelectasis in the lung bases.
Tracheostomy.  Positioning of the endotracheal tube is difficult to
determine due to patient positioning.

## 2013-08-24 IMAGING — CR DG CHEST 1V PORT
1 series · 1 of 1 positions shown · non-contrast
Comparison: 08/07/2012

CLINICAL DATA: Chest pain

PORTABLE CHEST - 1 VIEW

[AP]
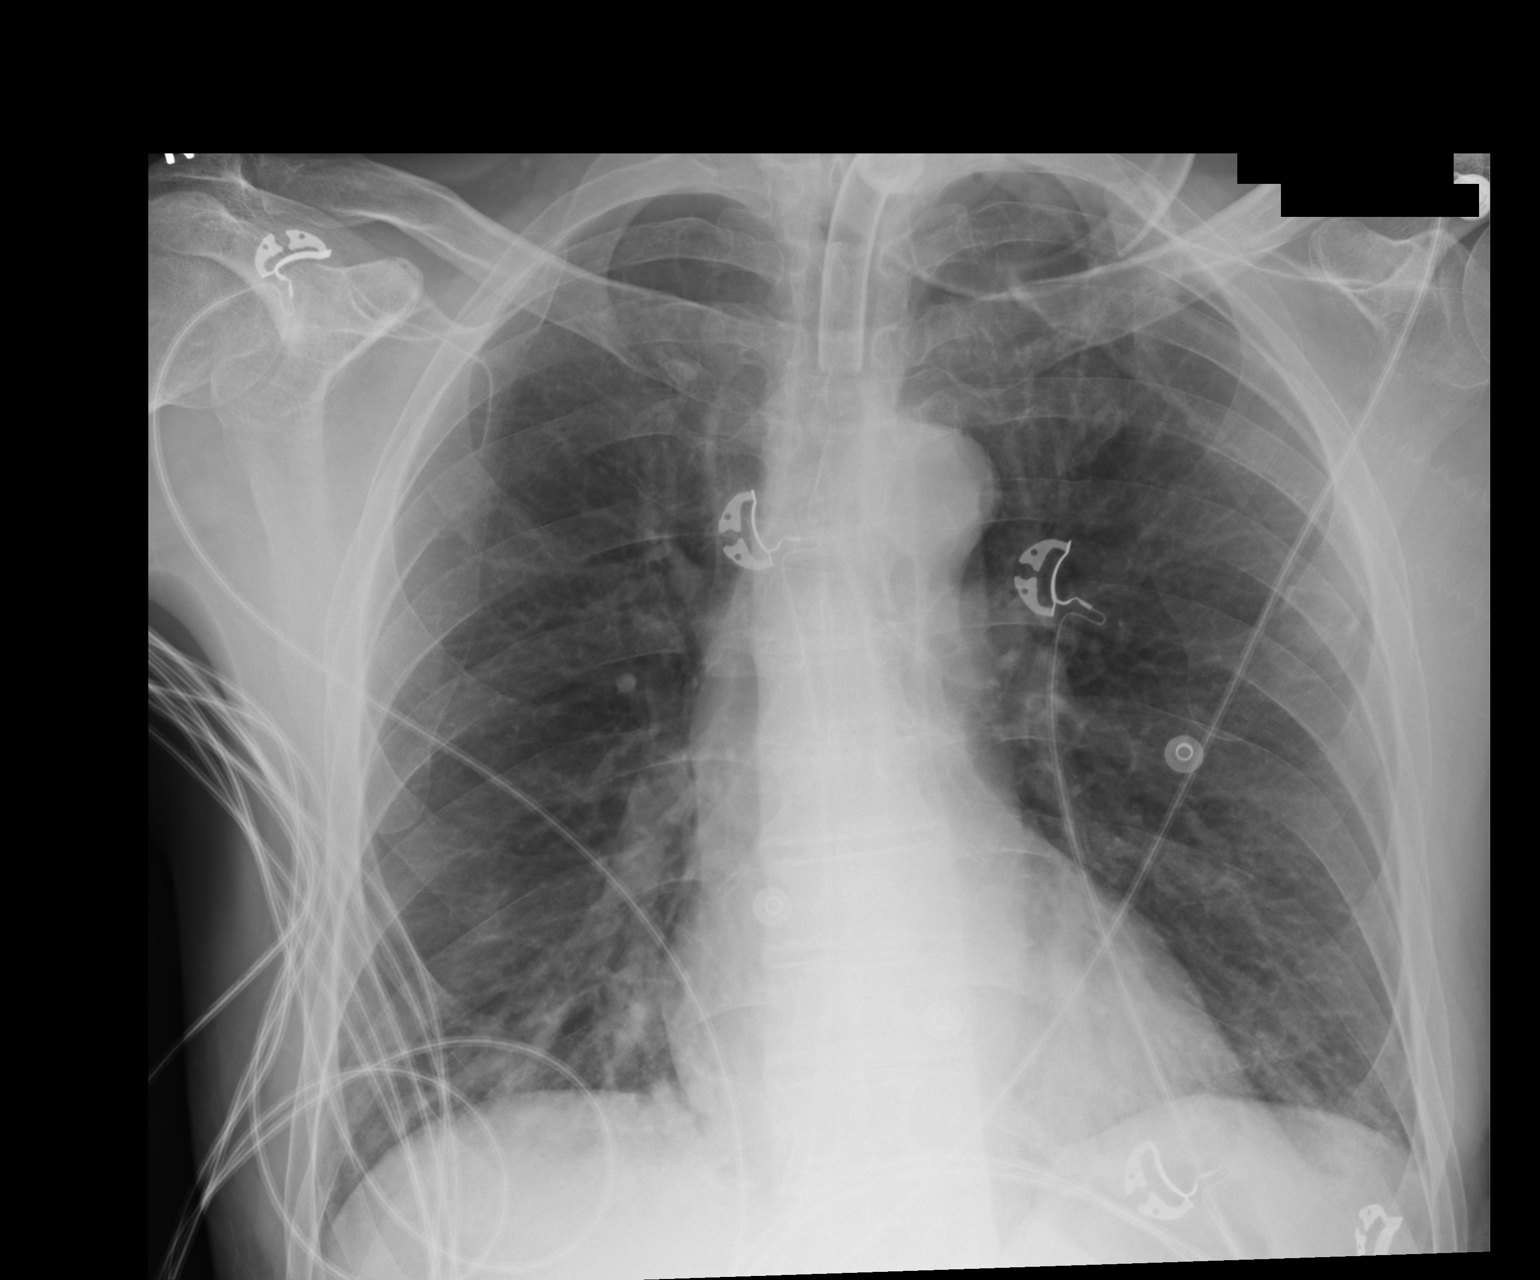

[1 of 1 positions shown; findings below may reference images not displayed]

FINDINGS: Tracheostomy device stable position.  Patchy subsegmental
atelectasis or infiltrates in both lung bases, slightly improved
since previous exam.  No effusion.  Heart size normal.  Regional
bones unremarkable.
IMPRESSION: Slight improvement in bibasilar atelectasis or infiltrates.

## 2013-08-26 IMAGING — CR DG CHEST 1V PORT
1 series · 1 of 1 positions shown · non-contrast
Comparison: 09/01/2012.

CLINICAL DATA: Line placement.

PORTABLE CHEST - 1 VIEW

[AP]
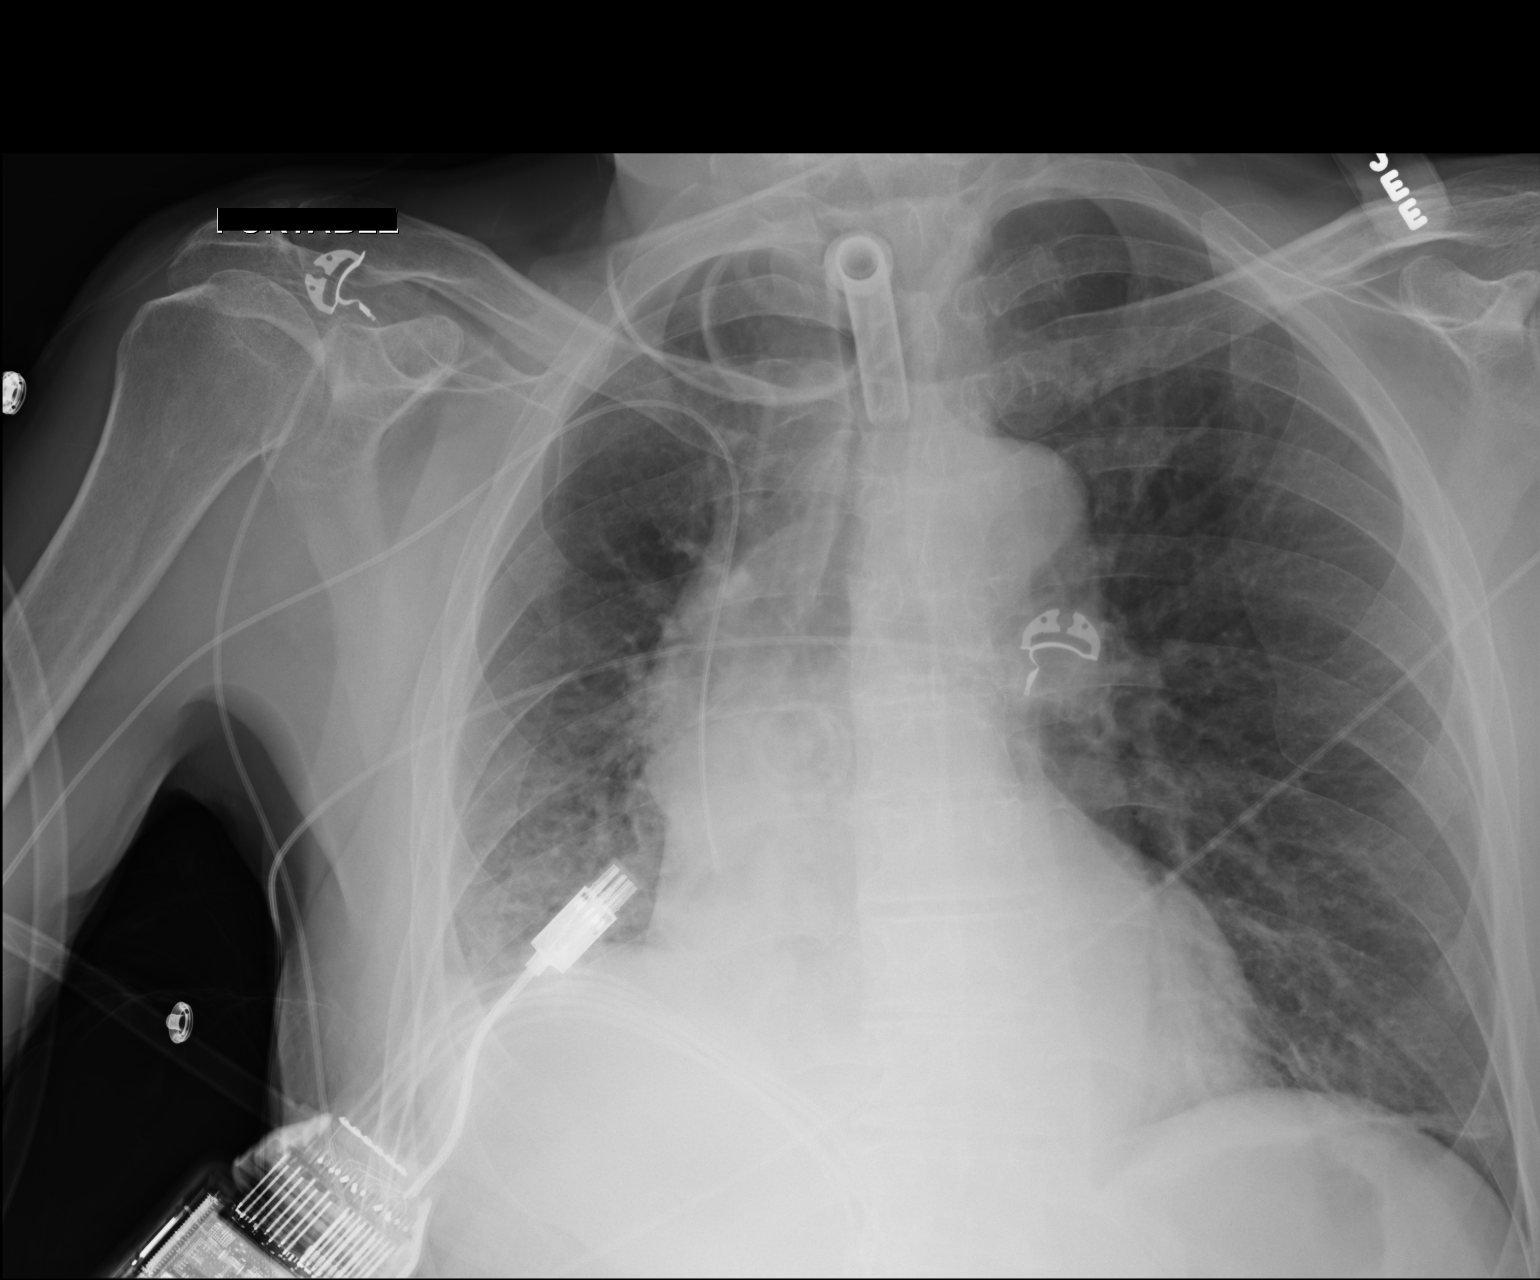

[1 of 1 positions shown; findings below may reference images not displayed]

FINDINGS: Tracheostomy is unchanged.  Right upper extremity PICC is
present with the tip at the cavoatrial junction.  The lung volumes
are low.  Pulmonary aeration is worse than on the prior exam, with
bilateral basilar atelectasis.  Cardiopericardial silhouette
remains enlarged with tortuous thoracic aorta.
IMPRESSION: 1.  New right upper extremity PICC with the tip at the cavoatrial
junction.
2.  Low volume chest with bilateral basilar atelectasis.

## 2013-08-27 IMAGING — CT CT HEAD W/O CM
1 of 2 series · 13 of 30 positions shown, 17 images · non-contrast
Comparison: MRI of 07/11/2012.  CT 07/10/2012.

CLINICAL DATA: Acute mental status changes.

CT HEAD WITHOUT CONTRAST
TECHNIQUE: Contiguous axial images were obtained from the base of
the skull through the vertex without contrast.

[Series 2: brain · axial · 0.47mm/px · z∈[-163,-13]mm · 13 of 36 slices shown, 17 images]
[im 3/36  brain]
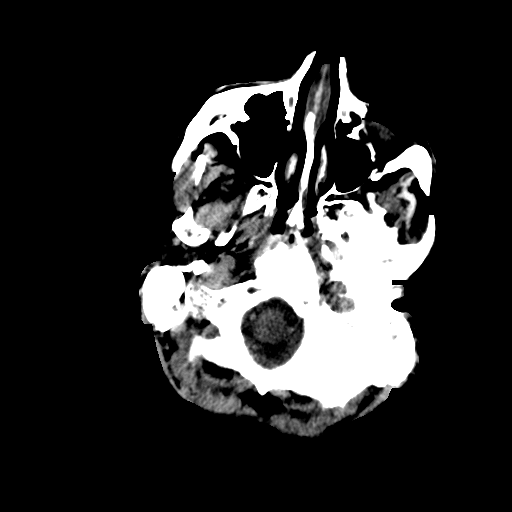
[im 3/36  bone]
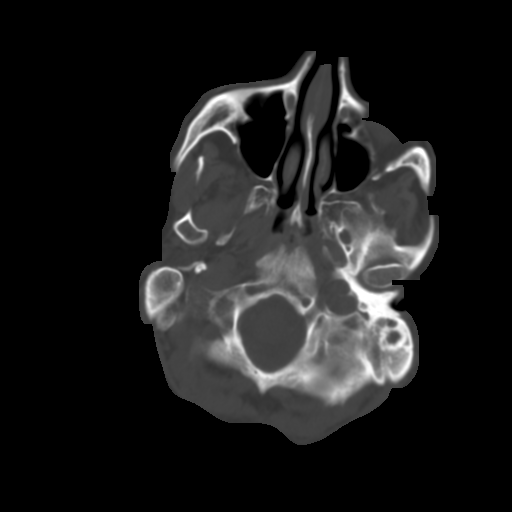
[im 6/36  brain]
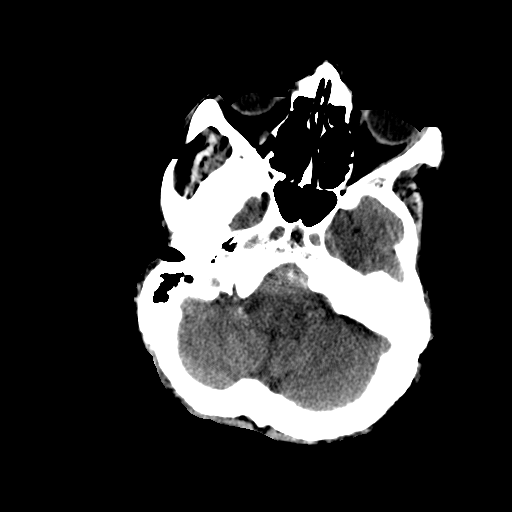
[im 8/36  brain]
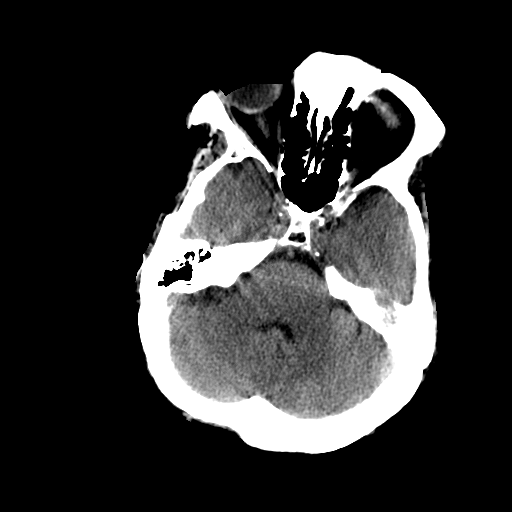
[im 11/36  brain]
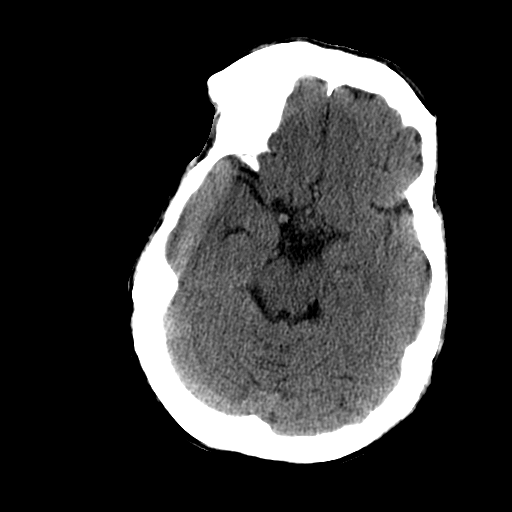
[im 13/36  brain]
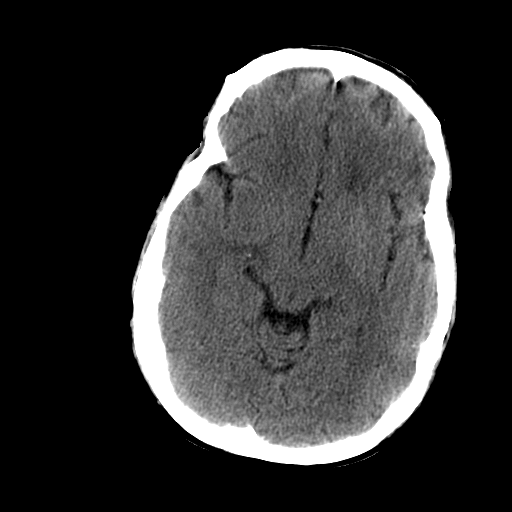
[im 13/36  bone]
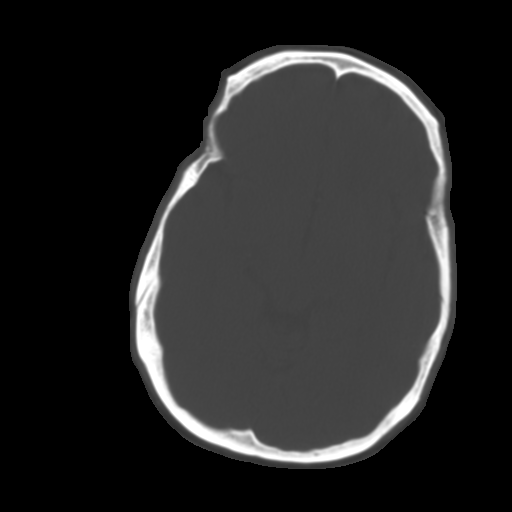
[im 16/36  brain]
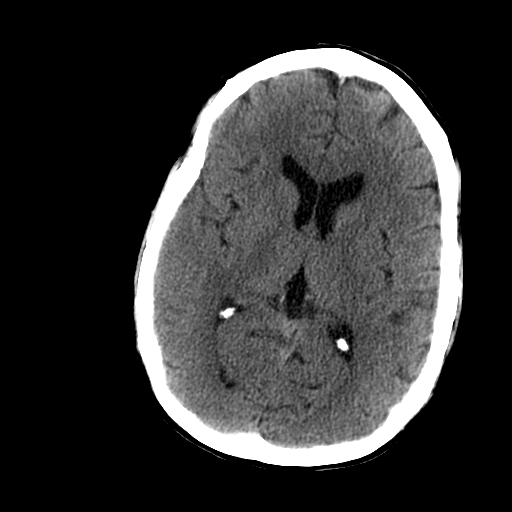
[im 18/36  brain]
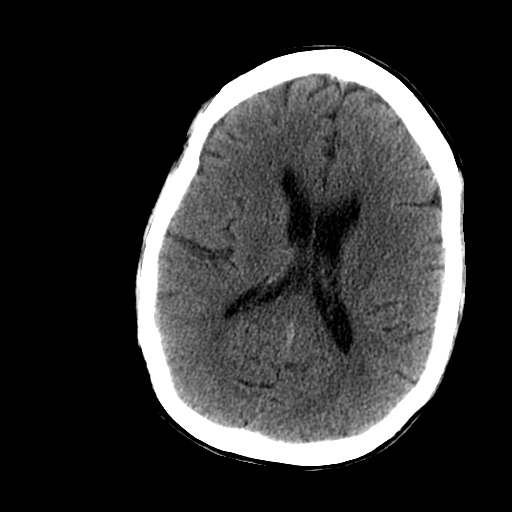
[im 21/36  brain]
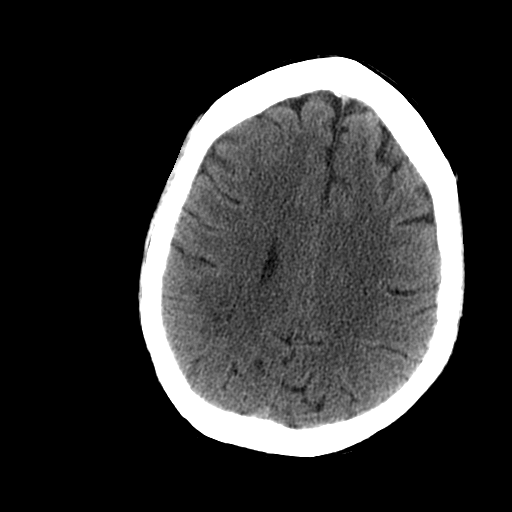
[im 23/36  brain]
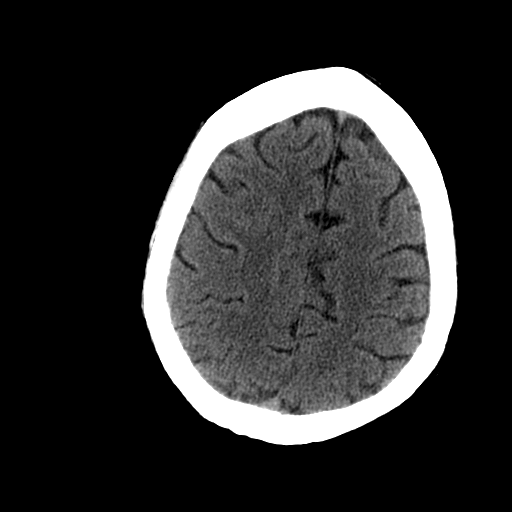
[im 23/36  bone]
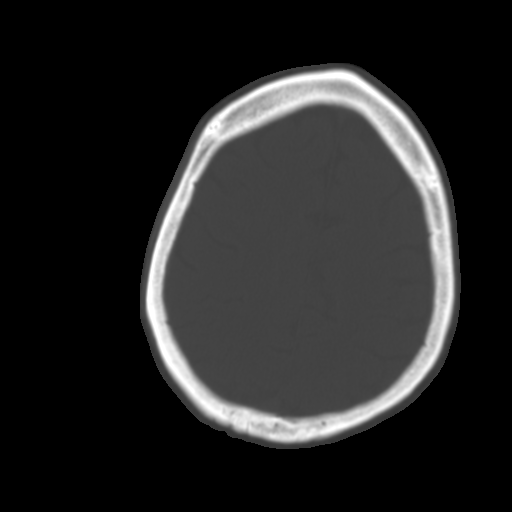
[im 26/36  brain]
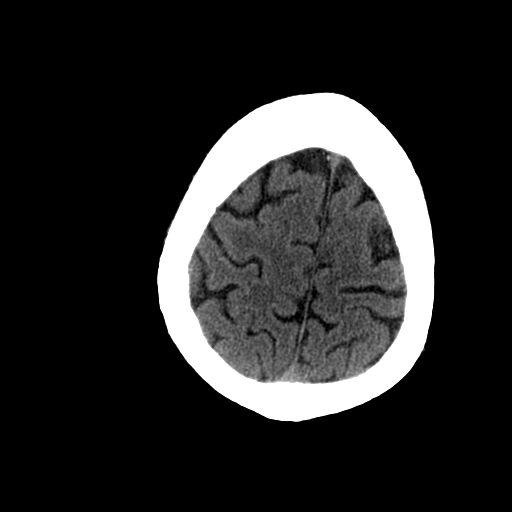
[im 28/36  brain]
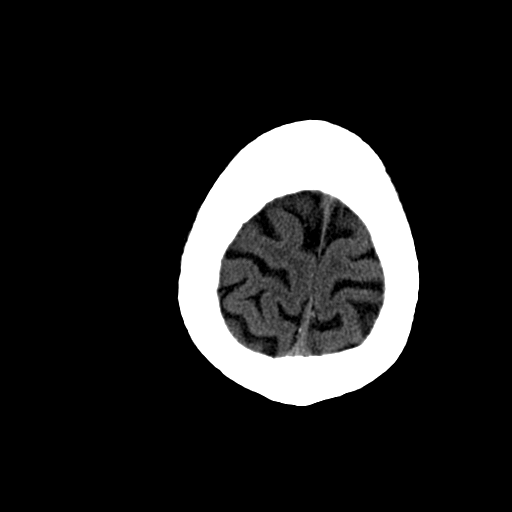
[im 31/36  brain]
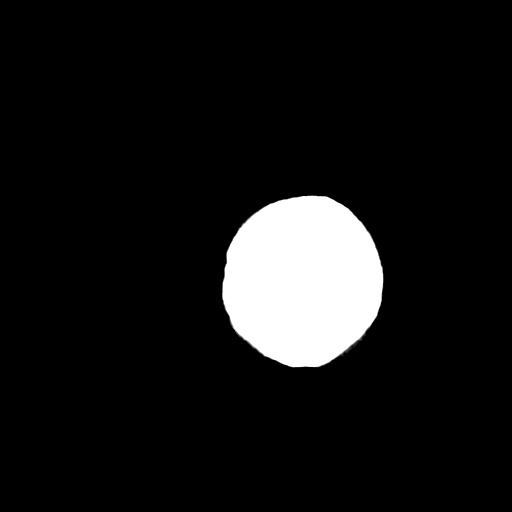
[im 33/36  brain]
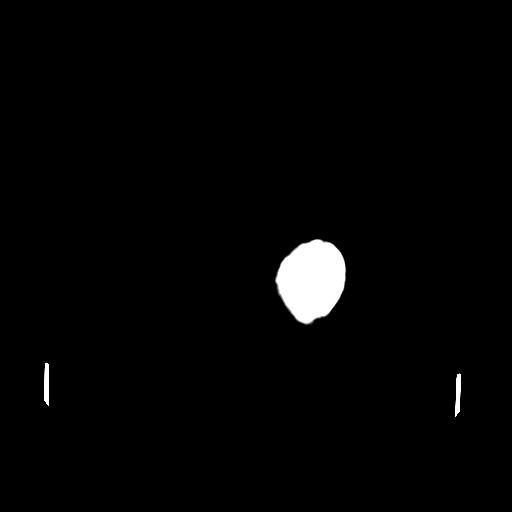
[im 33/36  bone]
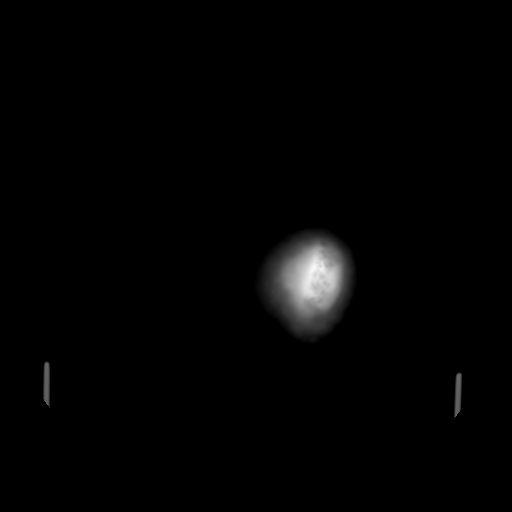

[13 of 30 positions shown; findings below may reference images not displayed]

FINDINGS: No mass lesion, mass effect, midline shift,
hydrocephalus, hemorrhage.  No territorial ischemia or acute
infarction.  Bilateral mastoid effusions are present, greater on
the left than right, chronic.  Intracranial atherosclerosis is
noted.  Scattered areas of low attenuation are present in the
periventricular white matter, also demonstrated on prior MRI.
IMPRESSION: No interval change or acute intracranial abnormality.

## 2013-10-12 ENCOUNTER — Non-Acute Institutional Stay (SKILLED_NURSING_FACILITY): Payer: Medicaid Other | Admitting: Internal Medicine

## 2013-10-12 DIAGNOSIS — G7000 Myasthenia gravis without (acute) exacerbation: Secondary | ICD-10-CM

## 2013-10-12 DIAGNOSIS — F22 Delusional disorders: Secondary | ICD-10-CM

## 2013-10-12 DIAGNOSIS — G4700 Insomnia, unspecified: Secondary | ICD-10-CM

## 2013-10-12 DIAGNOSIS — K219 Gastro-esophageal reflux disease without esophagitis: Secondary | ICD-10-CM

## 2013-10-12 NOTE — Progress Notes (Signed)
          PROGRESS NOTE  DATE: 10-12-13  FACILITY: Nursing Home [facility]  LEVEL OF CARE: SNF (31)  Routine Visit  CHIEF COMPLAINT:  Manage GERD, insomnia & myesthenia gravis  HISTORY OF PRESENT ILLNESS:  REASSESSMENT OF ONGOING PROBLEM(S):  INSOMNIA: The insomnia remains stable.  No complications noted from the medications presently being used. Patient denies ongoing insomnia, pain, hallucinations, delusions.  MYESTHENIA GRAVIS: pts myesthenia gravis is stable.  He denies progression of sx recently.  No complications from the medications currently being used.  GERD: pt's GERD is stable.  Denies ongoing heartburn, abd. Pain, nausea or vomiting.  Currently on a PPI & tolerates it without any adverse reactions.  PAST MEDICAL HISTORY : Reviewed.  No changes.  CURRENT MEDICATIONS: [facility]  REVIEW OF SYSTEMS:  GENERAL: no change in appetite, no fatigue, no weight changes, no fever, chills or weakness RESPIRATORY: no cough, SOB, DOE, wheezing, hemoptysis CARDIAC: no chest pain, edema or palpitations GI: no abdominal pain, diarrhea, constipation, heart burn, nausea or vomiting  PHYSICAL EXAMINATION  VS:  See vital signs section  GENERAL: no acute distress, normal body habitus EYES: Normal sclerae, normal conjunctivae, no discharge NECK: supple, trachea midline, no neck masses, no thyroid tenderness, no thyromegaly LYMPHATICS: No cervical lymphadenopathy, no supraclavicular lymphadenopathy RESPIRATORY: breathing is even & unlabored, BS CTAB CARDIAC: RRR, no murmur,no extra heart sounds, no edema GI: abdomen soft, normal BS, no masses, no tenderness, no hepatomegaly, no splenomegaly PSYCHIATRIC: the patient is alert & oriented to person, affect & behavior appropriate  LABS/RADIOLOGY: 3-15 platelets 134 otherwise CBC normal 2-15 platelets 137 otherwise CBC normal, total protein 5.8 otherwise liver profile normal, 2-15 hemoglobin A1c  5.5 11-14 BMP normal 9/14 cbc nl  8-14 liver profile normal, hemoglobin A1c 5.7  4/14 bmp nl 3/14 cbc nl, AST 50, ALT 96 ow cmp nl  ASSESSMENT/PLAN:  Myesthenia gravis-stable. Insomnia-denies on going sx. GERD-stable. Delusional d/o-stable. Thrombocytopenia-stable.  CPT CODE: 1610999309  Newton PiggGayani Y. Kerry Doryasanayaka, MD [facility] [phone]

## 2013-11-25 ENCOUNTER — Non-Acute Institutional Stay (SKILLED_NURSING_FACILITY): Payer: Medicaid Other | Admitting: Internal Medicine

## 2013-11-25 DIAGNOSIS — K219 Gastro-esophageal reflux disease without esophagitis: Secondary | ICD-10-CM

## 2013-11-25 DIAGNOSIS — G7000 Myasthenia gravis without (acute) exacerbation: Secondary | ICD-10-CM

## 2013-11-25 DIAGNOSIS — F22 Delusional disorders: Secondary | ICD-10-CM

## 2013-11-25 DIAGNOSIS — G4700 Insomnia, unspecified: Secondary | ICD-10-CM

## 2013-11-26 NOTE — Progress Notes (Signed)
        PROGRESS NOTE  DATE: 11-25-13  FACILITY: [facility] Home [facility]  LEVEL OF CARE: SNF (31)  Routine Visit  CHIEF COMPLAINT:  Manage GERD, insomnia & myesthenia gravis  HISTORY OF PRESENT ILLNESS:  REASSESSMENT OF ONGOING PROBLEM(S):  INSOMNIA: The insomnia remains stable.  No complications noted from the medications presently being used. Patient denies ongoing insomnia, pain, hallucinations, delusions.  MYESTHENIA GRAVIS: pts myesthenia gravis is stable.  He denies progression of sx recently.  No complications from the medications currently being used.  GERD: pt's GERD is stable.  Denies ongoing heartburn, abd. Pain, nausea or vomiting.  Currently on a PPI & tolerates it without any adverse reactions.  PAST MEDICAL HISTORY : Reviewed.  No changes.  CURRENT MEDICATIONS: [facility]  REVIEW OF SYSTEMS:  GENERAL: no change in appetite, no fatigue, no weight changes, no fever, chills or weakness RESPIRATORY: no cough, SOB, DOE, wheezing, hemoptysis CARDIAC: no chest pain, edema or palpitations GI: no abdominal pain, diarrhea, constipation, heart burn, nausea or vomiting  PHYSICAL EXAMINATION  VS:  See vital signs section  GENERAL: no acute distress, normal body habitus NECK: supple, trachea midline, no neck masses, no thyroid tenderness, no thyromegaly RESPIRATORY: breathing is even & unlabored, BS CTAB CARDIAC: RRR, no murmur,no extra heart sounds, no edema GI: abdomen soft, normal BS, no masses, no tenderness, no hepatomegaly, no splenomegaly PSYCHIATRIC: the patient is alert & oriented to person, affect & behavior appropriate  LABS/RADIOLOGY: 3-15 platelets 134 otherwise CBC normal 2-15 platelets 137 otherwise CBC normal, total protein 5.8 otherwise liver profile normal, 2-15 hemoglobin A1c 5.5 11-14 BMP normal 9/14 cbc nl  8-14 liver profile normal, hemoglobin A1c 5.7  4/14 bmp nl 3/14 cbc nl, AST 50, ALT 96 ow cmp  nl  ASSESSMENT/PLAN:  Myesthenia gravis-stable. Insomnia-denies on going sx. GERD-stable. Delusional d/o-stable. Thrombocytopenia-stable. Check BMP and hemoglobin A1c  CPT CODE: 1610999308  Newton PiggGayani Y. Kerry Doryasanayaka, MD [facility] [phone]

## 2013-12-31 ENCOUNTER — Non-Acute Institutional Stay (SKILLED_NURSING_FACILITY): Payer: Medicaid Other | Admitting: Internal Medicine

## 2013-12-31 DIAGNOSIS — G7000 Myasthenia gravis without (acute) exacerbation: Secondary | ICD-10-CM

## 2013-12-31 DIAGNOSIS — G4700 Insomnia, unspecified: Secondary | ICD-10-CM

## 2013-12-31 DIAGNOSIS — K219 Gastro-esophageal reflux disease without esophagitis: Secondary | ICD-10-CM

## 2013-12-31 DIAGNOSIS — F22 Delusional disorders: Secondary | ICD-10-CM

## 2013-12-31 NOTE — Progress Notes (Signed)
        PROGRESS NOTE  DATE: 12-31-13  FACILITY: [facility] Home [facility]  LEVEL OF CARE: SNF (31)  Routine Visit  CHIEF COMPLAINT:  Manage GERD, insomnia & myesthenia gravis  HISTORY OF PRESENT ILLNESS:  REASSESSMENT OF ONGOING PROBLEM(S):  INSOMNIA: The insomnia remains stable.  No complications noted from the medications presently being used. Patient denies ongoing insomnia, pain, hallucinations, delusions.  MYESTHENIA GRAVIS: pts myesthenia gravis is stable.  He denies progression of sx recently.  No complications from the medications currently being used.  GERD: pt's GERD is stable.  Denies ongoing heartburn, abd. Pain, nausea or vomiting.  Currently on a PPI & tolerates it without any adverse reactions.  PAST MEDICAL HISTORY : Reviewed.  No changes.  CURRENT MEDICATIONS: Reviewed per [facility]  REVIEW OF SYSTEMS:  GENERAL: no change in appetite, no fatigue, no weight changes, no fever, chills or weakness RESPIRATORY: no cough, SOB, DOE, wheezing, hemoptysis CARDIAC: no chest pain, edema or palpitations GI: no abdominal pain, diarrhea, constipation, heart burn, nausea or vomiting  PHYSICAL EXAMINATION  VS:  See vital signs section  GENERAL: no acute distress, normal body habitus NECK: supple, trachea midline, no neck masses, no thyroid tenderness, no thyromegaly RESPIRATORY: breathing is even & unlabored, BS CTAB CARDIAC: RRR, no murmur,no extra heart sounds, no edema GI: abdomen soft, normal BS, no masses, no tenderness, no hepatomegaly, no splenomegaly PSYCHIATRIC: the patient is alert & oriented to person, affect & behavior appropriate  LABS/RADIOLOGY: 7-15 platelets 123 otherwise CBC normal, CMP normal 6-15 hemoglobin A1c 5.7 3-15 platelets 134 otherwise CBC normal 2-15 platelets 137 otherwise CBC normal, total protein 5.8 otherwise liver profile normal, 2-15 hemoglobin A1c 5.5 11-14 BMP normal 9/14 cbc nl  8-14 liver profile normal,  hemoglobin A1c 5.7  4/14 bmp nl 3/14 cbc nl, AST 50, ALT 96 ow cmp nl  ASSESSMENT/PLAN:  Myesthenia gravis-stable. Insomnia-denies on going sx. GERD-stable. Delusional d/o-stable. Thrombocytopenia-platelets further declined Schizophrenia-Risperdal increased  CPT CODE: 5621399308  Newton PiggGayani Y. Kerry Doryasanayaka, MD [facility] [phone]

## 2014-01-25 ENCOUNTER — Non-Acute Institutional Stay (SKILLED_NURSING_FACILITY): Payer: Medicaid Other | Admitting: Internal Medicine

## 2014-01-25 DIAGNOSIS — G4700 Insomnia, unspecified: Secondary | ICD-10-CM

## 2014-01-25 DIAGNOSIS — G7000 Myasthenia gravis without (acute) exacerbation: Secondary | ICD-10-CM

## 2014-01-25 DIAGNOSIS — K219 Gastro-esophageal reflux disease without esophagitis: Secondary | ICD-10-CM

## 2014-01-25 DIAGNOSIS — D7589 Other specified diseases of blood and blood-forming organs: Secondary | ICD-10-CM

## 2014-01-26 NOTE — Progress Notes (Signed)
        PROGRESS NOTE  DATE: 01-25-14  FACILITY: [facility]  LEVEL OF CARE: SNF (31)  Routine Visit  CHIEF COMPLAINT:  Manage GERD, insomnia & myesthenia gravis  HISTORY OF PRESENT ILLNESS:  REASSESSMENT OF ONGOING PROBLEM(S):  INSOMNIA: The insomnia remains stable.  No complications noted from the medications presently being used. Patient denies ongoing insomnia, pain, hallucinations, delusions.  MYESTHENIA GRAVIS: pts myesthenia gravis is stable.  He denies progression of sx recently.  No complications from the medications currently being used.  GERD: pt's GERD is stable.  Denies ongoing heartburn, abd. Pain, nausea or vomiting.  Currently on a PPI & tolerates it without any adverse reactions.  PAST MEDICAL HISTORY : Reviewed.  No changes.  CURRENT MEDICATIONS: [facility]  REVIEW OF SYSTEMS:  GENERAL: no change in appetite, no fatigue, no weight changes, no fever, chills or weakness RESPIRATORY: no cough, SOB, DOE, wheezing, hemoptysis CARDIAC: no chest pain, edema or palpitations GI: no abdominal pain, diarrhea, constipation, heart burn, nausea or vomiting  PHYSICAL EXAMINATION  VS:  See vital signs section  GENERAL: no acute distress, normal body habitus EYES: Normal sclerae, normal conjunctivae, no discharge NECK: supple, trachea midline, no neck masses, no thyroid tenderness, no thyromegaly LYMPHATICS: No cervical lymphadenopathy, no supraclavicular lymphadenopathy RESPIRATORY: breathing is even & unlabored, BS CTAB CARDIAC: RRR, no murmur,no extra heart sounds, no edema GI: abdomen soft, normal BS, no masses, no tenderness, no hepatomegaly, no splenomegaly PSYCHIATRIC: the patient is alert & oriented to person, affect & behavior appropriate  LABS/RADIOLOGY: 8-15 MCV 100, platelets 117, WBC 6.3 normal hemoglobin 15.2 7-15 platelets 123 otherwise CBC normal, CMP normal 6-15 hemoglobin A1c 5.7 3-15 platelets 134  otherwise CBC normal 2-15 platelets 137 otherwise CBC normal, total protein 5.8 otherwise liver profile normal, 2-15 hemoglobin A1c 5.5 11-14 BMP normal 9/14 cbc nl  8-14 liver profile normal, hemoglobin A1c 5.7  4/14 bmp nl 3/14 cbc nl, AST 50, ALT 96 ow cmp nl  ASSESSMENT/PLAN:  Myesthenia gravis-stable. Insomnia-denies on going sx. GERD-stable. Macrocytosis-new problem. Check RBC folate and vitamin B12 level. Delusional d/o-stable. Thrombocytopenia-stable Schizophrenia-stable Elevated blood pressure -- new problem. We'll review a log.  CPT CODE: 1610999309  Newton PiggGayani Y. Kerry Doryasanayaka, MD [facility] [phone]

## 2014-02-02 ENCOUNTER — Non-Acute Institutional Stay (SKILLED_NURSING_FACILITY): Payer: Medicaid Other | Admitting: Internal Medicine

## 2014-02-02 DIAGNOSIS — G7000 Myasthenia gravis without (acute) exacerbation: Secondary | ICD-10-CM

## 2014-02-08 NOTE — Progress Notes (Addendum)
Patient ID: Victor Durham, male   DOB: Dec 07, 1955, 58 y.o.   MRN: [id number]               PROGRESS NOTE  DATE:  02/02/2014    FACILITY: [facility]    LEVEL OF CARE:   SNF   Acute Visit   CHIEF COMPLAINT:  Follow up myasthenia gravis.    HISTORY OF PRESENT ILLNESS:  This is a now 58 year-old man who has a history of very significant myasthenia gravis.  The patient presented to hospital in January 2014.  He ended up being admitted after several sent-homes from the emergency room.  He ended up on a ventilator and required a tracheostomy and a PEG tube.  He was treated with steroids and Mestinon and he actually did fairly well.    He was last hospitalized in March 2014 with MRSA bacteremia.   He was started on Imuran in June 2014.  I think the intent at that point was to begin to taper his steroids.  I do not know that this was actually done as he remains on 30 mg of prednisone.  I saw him walking in the hall.  He is looking increasingly cushingoid, which brought this to my attention.    CURRENT MEDICATIONS:  Medication list is reviewed.     ASA 81 q.d.    Deltasone 10 mg, 3 tablets daily.    Imuran 50 mg, 2 tablets/100 mg daily.    Protonix 40 q.d.    Risperdal 1 mg under the tongue twice daily for schizophrenia.    Risperdal Consta 25 mg every two weeks.    SOCIAL HISTORY:   The patient became a longstanding resident of the facility.  I think this was mostly dictated by an undiagnosed psychiatric illness for which he has been followed by Psychiatry in the building.  He is felt to have schizophrenia.  At this point, I am not completely sure why he needs to be on Risperdal Consta if he is taking oral Risperdal.     REVIEW OF SYSTEMS:   GENERAL:  The patient states he feels well.   HEENT:  No complaints of diplopia or swallowing difficulties.   MOTOR:  He states his strength is good.  Indeed, I see him marching up and down the hall frequently.    PHYSICAL EXAMINATION:     GENERAL APPEARANCE:  He looks somewhat cushingoid, but otherwise in no distress.   HEENT:   MOUTH/THROAT:   No oral lesions are seen.   NECK/THYROID:  No thyroid is palpable.     CHEST/RESPIRATORY:  Clear air entry bilaterally.   CARDIOVASCULAR:  CARDIAC:   Heart sounds are normal.  There are no murmurs.   GASTROINTESTINAL:  LIVER/SPLEEN/KIDNEYS:  No liver, no spleen.  No tenderness.   NEUROLOGICAL:    SENSATION/STRENGTH:  His strength is good.   CRANIAL NERVES:  Extraocular movements seem normal except he does not have much in the way of conjugate upgaze, but there is no fatigue.   DEEP TENDON REFLEXES:  Reflexes are symmetric.   OTHER:  I note that he has a fine tremor of his outstretched hands and mild palmar erythema.    ASSESSMENT/PLAN:  Myasthenia gravis.  At one point, this was really severe.  He is currently on prednisone 30 mg a day, Imuran 100 q.d., and Mestinon 90 mg five times daily.  It would appear that he has not followed up with Dr. Anne Hahn of Neurology.  I am  not completely certain.  He has been on the Imuran for over a year.  I think we can safely begin to taper the prednisone at this point as he has really been clinically stable.  Recent lab work is reviewed.  There are no abnormalities here.    Psychosis.  I think the patient has been formally diagnosed with schizophrenia, although I do not think that was known when he came in the building.  His parents were fairly instrumental in getting him to stay here.  I believe he is on Medicaid here now.  I am puzzled as to why he needs both parenteral Risperdal as well as oral Risperdal.    I am going to begin to taper the prednisone, get a follow-up with Dr. Anne Hahn.  I think this was supposed to have been done roughly a year ago.  He has been stable on the Imuran and the Mestinon.

## 2014-02-16 ENCOUNTER — Non-Acute Institutional Stay (SKILLED_NURSING_FACILITY): Payer: Medicaid Other | Admitting: Internal Medicine

## 2014-02-16 DIAGNOSIS — I82409 Acute embolism and thrombosis of unspecified deep veins of unspecified lower extremity: Secondary | ICD-10-CM

## 2014-02-16 DIAGNOSIS — I82401 Acute embolism and thrombosis of unspecified deep veins of right lower extremity: Secondary | ICD-10-CM

## 2014-02-19 ENCOUNTER — Non-Acute Institutional Stay (SKILLED_NURSING_FACILITY): Payer: Medicaid Other | Admitting: Internal Medicine

## 2014-02-19 DIAGNOSIS — I82409 Acute embolism and thrombosis of unspecified deep veins of unspecified lower extremity: Secondary | ICD-10-CM

## 2014-02-19 DIAGNOSIS — I82401 Acute embolism and thrombosis of unspecified deep veins of right lower extremity: Secondary | ICD-10-CM

## 2014-02-22 ENCOUNTER — Non-Acute Institutional Stay (SKILLED_NURSING_FACILITY): Payer: Medicaid Other | Admitting: Internal Medicine

## 2014-02-22 DIAGNOSIS — F22 Delusional disorders: Secondary | ICD-10-CM

## 2014-02-22 DIAGNOSIS — G4700 Insomnia, unspecified: Secondary | ICD-10-CM

## 2014-02-22 DIAGNOSIS — K219 Gastro-esophageal reflux disease without esophagitis: Secondary | ICD-10-CM

## 2014-02-22 DIAGNOSIS — G7000 Myasthenia gravis without (acute) exacerbation: Secondary | ICD-10-CM

## 2014-02-22 NOTE — Progress Notes (Addendum)
Patient ID: Victor Durham, male   DOB: 12/03/1955, 58 y.o.   MRN: [id number]               PROGRESS NOTE  DATE:  02/16/2014    FACILITY: [facility]    LEVEL OF CARE:   SNF   Acute Visit   CHIEF COMPLAINT:  Right leg swelling.     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  I have asked for a follow-up appointment with Dr. Anne Hahn of Neurology.  This is apparently booked for the beginning of October.    In the meantime, he has developed significant swelling in the right leg and the nurses asked me to look at this.  There is a history of a DVT, the exact history here will need to be researched  REVIEW OF SYSTEMS:   CHEST/RESPIRATORY:  No shortness of breath.  CARDIAC:   No chest pain.    GI:  No nausea, vomiting or diarrhea.    MUSCULOSKELETAL:  Extremities:  The patient denies any trauma.  States the edema has been there for two days.  He denies any pain.    PHYSICAL EXAMINATION:   O2 SATURATIONS:  97% on room air.   RESPIRATIONS:  18 and unlabored.   PULSE:  74.   GENERAL APPEARANCE:  The patient is not in any distress.   CHEST/RESPIRATORY:  Clear air entry bilaterally.   CARDIOVASCULAR:  CARDIAC:   Heart sounds are normal.  In particular, there is no accentuation of his second sound.  JVP is not elevated.     GASTROINTESTINAL:  LIVER/SPLEEN/KIDNEYS:  No liver, no spleen are palpable.   HERNIA:  He does have a fairly substantial, but asymptomatic, periumbilical hernia that does not reduce very easily.  He denies any symptoms here.   MUSCULOSKELETAL:   EXTREMITIES:   RIGHT LOWER EXTREMITY:  Indeed, in the right leg is swelling right up to the knee.  This is pitting and fairly substantial.  There is erythema and warmth.  It is fairly diffuse.  He really denies any tenderness, however.    ASSESSMENT/PLAN:  Right leg swelling.  This looks like a DVT clinically.  I do  not know quite why he would develop this.  He is up walking constantly.  Nevertheless, the first step is to rule out a DVT with a duplex ultrasound.  This does not look like cellulitis.  He may indeed have a Baker's cyst on the bottom of the right leg and I found myself wondering about this, as well.  I have advised him to keep the leg fairly quiet.    CPT CODE: 40981

## 2014-02-22 NOTE — Progress Notes (Signed)
        PROGRESS NOTE  DATE: 02-22-14  FACILITY: Nursing Home [facility]  LEVEL OF CARE: SNF (31)  Routine Visit  CHIEF COMPLAINT:  Manage GERD, insomnia & myesthenia gravis  HISTORY OF PRESENT ILLNESS:  REASSESSMENT OF ONGOING PROBLEM(S):  INSOMNIA: The insomnia remains stable.  No complications noted from the medications presently being used. Patient denies ongoing insomnia, pain, hallucinations, delusions.  MYESTHENIA GRAVIS: pts myesthenia gravis is stable.  He denies progression of sx recently.  No complications from the medications currently being used.  GERD: pt's GERD is stable.  Denies ongoing heartburn, abd. Pain, nausea or vomiting.  Currently on a PPI & tolerates it without any adverse reactions.  PAST MEDICAL HISTORY : Reviewed.  No changes.  CURRENT MEDICATIONS: Reviewed per [facility]  REVIEW OF SYSTEMS:  GENERAL: no change in appetite, no fatigue, no weight changes, no fever, chills or weakness RESPIRATORY: no cough, SOB, DOE, wheezing, hemoptysis CARDIAC: no chest pain, edema or palpitations GI: no abdominal pain, diarrhea, constipation, heart burn, nausea or vomiting  PHYSICAL EXAMINATION  VS:  See vital signs section  GENERAL: no acute distress, normal body habitus EYES: Normal sclerae, normal conjunctivae, no discharge NECK: supple, trachea midline, no neck masses, no thyroid tenderness, no thyromegaly LYMPHATICS: No cervical lymphadenopathy, no supraclavicular lymphadenopathy RESPIRATORY: breathing is even & unlabored, BS CTAB CARDIAC: RRR, no murmur,no extra heart sounds, +2 right lower extremity edema GI: abdomen soft, normal BS, no masses, no tenderness, no hepatomegaly, no splenomegaly PSYCHIATRIC: the patient is alert & oriented to person, affect & behavior appropriate  LABS/RADIOLOGY: 9-15 MCV 101, platelets 148 otherwise CBC normal 8-15 MCV 100, platelets 117, WBC 6.3 normal hemoglobin 15.2, RBC folate 1036, vitamin B12  level 717, folate greater than 19.9 7-15 platelets 123 otherwise CBC normal, CMP normal 6-15 hemoglobin A1c 5.7 3-15 platelets 134 otherwise CBC normal 2-15 platelets 137 otherwise CBC normal, total protein 5.8 otherwise liver profile normal, 2-15 hemoglobin A1c 5.5 11-14 BMP normal 9/14 cbc nl  8-14 liver profile normal, hemoglobin A1c 5.7  4/14 bmp nl 3/14 cbc nl, AST 50, ALT 96 ow cmp nl  ASSESSMENT/PLAN:  Myesthenia gravis-stable. Insomnia-denies on going sx. GERD-stable. Macrocytosis- RBC folate and vitamin B12 level normal Delusional d/o-stable. Thrombocytopenia-platelet count increased Schizophrenia-stable  CPT CODE: 16109  Newton Pigg. Kerry Dory, MD [facility] [phone]

## 2014-02-28 NOTE — Progress Notes (Addendum)
Patient ID: Victor Durham, male   DOB: 1955-10-22, 58 y.o.   MRN: [id number]               PROGRESS NOTE  DATE:  02/19/2014    FACILITY: [facility]    LEVEL OF CARE:   SNF   Acute Visit   CHIEF COMPLAINT:  Follow up right leg DVT.    HISTORY OF PRESENT ILLNESS:  I saw this patient earlier in the week.  His complaint was right leg swelling.  He had absolutely no pain.  I noted some warmth but, again, no tenderness.  Apparently, his duplex ultrasound was positive, although I do not have these results.  He has been transitioning from Lovenox to Coumadin.   Per the history apparently, he has a prior history of DVT.  However, his Coumadin was stopped some time ago.  I will need to review this history.    REVIEW OF SYSTEMS:   CHEST/RESPIRATORY:  The patient is not complaining of shortness of breath.  No pleuritic chest pain.   CARDIAC:   No exertional chest pain.     GI:  No nausea, vomiting or diarrhea.     PHYSICAL EXAMINATION:   VITAL SIGNS:   O2 SATURATIONS:  96% on room air.   PULSE:  82.   RESPIRATIONS: 18 and unlabored.   GENERAL APPEARANCE:  He is not in any distress, lying in bed.   CHEST/RESPIRATORY:  Clear air entry bilaterally.   CARDIOVASCULAR:  CARDIAC:   Heart sounds are normal.  There are no murmurs.   GASTROINTESTINAL:  ABDOMEN:   Soft.  There are no masses.   CIRCULATION:    EDEMA/VARICOSITIES:  Extremities:  The right leg edema appears better than the other day.  There is certainly less warmth.  He has never complained of tenderness.    ASSESSMENT/PLAN:  Extensive right leg DVT.  The mobile duplex ultrasound is now available.  This showed diffuse deep vein thrombosis involving the right lower extremity with no blood flow.  He is currently on Lovenox at 100 b.i.d. and Coumadin 6 mg per day.  He is being followed closely by our consultant pharmacist, who graciously follows these values in our facility.    I need to look back through this man's history.   He has severe myasthenia gravis and at one point was on a ventilator.  As this is his second recurrence, would probably dictate longstanding anticoagulation.  The Lovenox to Coumadin seems reasonable to me here.

## 2014-03-08 ENCOUNTER — Non-Acute Institutional Stay (SKILLED_NURSING_FACILITY): Payer: Medicaid Other | Admitting: Internal Medicine

## 2014-03-08 DIAGNOSIS — R635 Abnormal weight gain: Secondary | ICD-10-CM

## 2014-03-10 NOTE — Progress Notes (Signed)
Patient ID: Victor Durham, male   DOB: 1955/08/28, 58 y.o.   MRN: [id number]           PROGRESS NOTE  DATE: 03/08/2014          [facility]  LEVEL OF CARE: SNF (31)  Acute Visit  CHIEF COMPLAINT:  Manage weight gain.    HISTORY OF PRESENT ILLNESS: I was requested by the staff to assess the patient regarding above problem(s):  Pharmacy consultant reports that patient has had a significant weight gain.  In March 2014, weight was 150 pounds, and current weight is 231 pounds with 30 pounds of this increase in the last three months.  Patient is aware of weight gain.  He denies shortness of breath or lower extremity swelling.  He does not have a history of CHF.    PAST MEDICAL HISTORY : Reviewed.  No changes/see problem list  CURRENT MEDICATIONS: Reviewed per MAR/see medication list  REVIEW OF SYSTEMS:  GENERAL: aware of weight gain, no change in appetite, no fatigue, no fever, chills or weakness RESPIRATORY: no cough, SOB, DOE,, wheezing, hemoptysis CARDIAC: no chest pain, edema or palpitations GI: no abdominal pain, diarrhea, constipation, heart burn, nausea or vomiting  PHYSICAL EXAMINATION  VS: see VS section  GENERAL: no acute distress, normal body habitus NECK: supple, trachea midline, no neck masses, no thyroid tenderness, no thyromegaly RESPIRATORY: breathing is even & unlabored, BS CTAB CARDIAC: RRR, no murmur,no extra heart sounds, no edema GI: abdomen soft, normal BS, no masses, no tenderness, no hepatomegaly, no splenomegaly PSYCHIATRIC: the patient is alert & oriented to person, affect & behavior appropriate  LABS/RADIOLOGY:  In 02/2014:  MCV 103, otherwise CBC normal.    BMP normal.      In 12/2013:  CMP normal.      ASSESSMENT/PLAN:  Weight gain.  New problem.  Check TSH.  Obtain dietary consult.    CPT CODE: [id number]           Angela CoxGayani Y Connor Foxworthy, MD [facility] [phone]

## 2014-03-16 ENCOUNTER — Ambulatory Visit (INDEPENDENT_AMBULATORY_CARE_PROVIDER_SITE_OTHER): Payer: Medicaid Other | Admitting: Neurology

## 2014-03-16 ENCOUNTER — Encounter (INDEPENDENT_AMBULATORY_CARE_PROVIDER_SITE_OTHER): Payer: Self-pay

## 2014-03-16 ENCOUNTER — Encounter: Payer: Self-pay | Admitting: Neurology

## 2014-03-16 VITALS — BP 155/97 | HR 121 | Ht 72.0 in | Wt 229.0 lb

## 2014-03-16 DIAGNOSIS — G7000 Myasthenia gravis without (acute) exacerbation: Secondary | ICD-10-CM

## 2014-03-16 NOTE — Progress Notes (Signed)
Reason for visit: Myasthenia gravis  Victor Durham is an 58 y.o. male  History of present illness:  Victor Durham is a 58 year old right-handed white male with a history of myasthenia gravis associated with oral pharyngeal weakness and respiratory compromise. The patient had a feeding tube in place for some time, but this has been removed, and he is now taking food and fluids and medications orally. The patient has done quite well since last seen, but he was lost to followup since June of 2014. The patient had been placed on Imuran, but it appears that this medication was discontinued for some reason, and the patient is unclear why. The patient remains on prednisone, and he just recently was placed on a lower dose of 25 mg daily. The patient denies any double vision, ptosis, difficulty with swallowing or chewing. The patient denies any weakness of the extremities. The patient apparently is not on oral Mestinon. The patient returns to this office for an evaluation. The patient claims that he has had recent blood work done, the results of the blood work are not available to me.  Past Medical History  Diagnosis Date  . DVT (deep venous thrombosis)   . Hypertension   . High cholesterol   . Myasthenia gravis   . Anxiety   . Dysphagia 06/30/12    Past Surgical History  Procedure Laterality Date  . Esophagogastroduodenoscopy  07/11/2012    Procedure: ESOPHAGOGASTRODUODENOSCOPY (EGD);  Surgeon: Shirley FriarVincent C. Schooler, MD;  [facility];  Service: Endoscopy;  Laterality: N/A;  BEDSIDE  . Tracheostomy N/A   . Tee without cardioversion N/A 09/09/2012    Procedure: TRANSESOPHAGEAL ECHOCARDIOGRAM (TEE);  Surgeon: Dolores Pattyaniel R Bensimhon, MD;  [facility];  Service: Cardiovascular;  Laterality: N/A;    Family History  Problem Relation Age of Onset  . Myasthenia gravis Father     Social history:  reports that he has never smoked. He has never used smokeless tobacco. He  reports that he does not drink alcohol or use illicit drugs.   No Known Allergies  Medications:  Current Outpatient Prescriptions on File Prior to Visit  Medication Sig Dispense Refill  . aspirin 81 MG chewable tablet Give 81 mg by tube daily.      . risperiDONE (RISPERDAL M-TABS) 2 MG disintegrating tablet Take 2 mg by mouth 2 (two) times daily.      . vitamin C (ASCORBIC ACID) 500 MG tablet Take 500 mg by mouth daily.      Marland Kitchen. zinc sulfate 220 MG capsule Take 220 mg by mouth daily.      Marland Kitchen. azaTHIOprine (IMURAN) 50 MG tablet Take 2 tablets (100 mg total) by mouth daily.  60 tablet  3   No current facility-administered medications on file prior to visit.    ROS:  Out of a complete 14 system review of symptoms, the patient complains only of the following symptoms, and all other reviewed systems are negative.  Tremor  Blood pressure 155/97, pulse 121, height 6' (1.829 m), weight 229 lb (103.874 kg).  Physical Exam  General: The patient is alert and cooperative at the time of the examination.  Skin: No significant peripheral edema is noted.   Neurologic Exam  Mental status: The patient is oriented x 3.  Cranial nerves: Facial symmetry is present. Speech is normal, no aphasia or dysarthria is noted. Extraocular movements are somewhat restricted in all fields. Visual fields are full. With superior gaze for 1 minute, slight exotropia of the  left eye is noted at 30 seconds, minimal ptosis is seen of the left eye after 1 minute. The patient does not note subjective double vision. Strength of the facial muscles, and the muscles head turning and shoulder shrug are normal bilaterally.  Motor: The patient has good strength in all 4 extremities. With the arms outstretched 1 minute, no fatigable weakness of the deltoid muscles is noted.  Sensory examination: Soft touch sensation is symmetric on the face, arms, and legs.  Coordination: The patient has good finger-nose-finger and heel-to-shin  bilaterally. The patient has an intention tremor with finger-nose-finger bilaterally.  Gait and station: The patient has a normal gait. Tandem gait is normal. Romberg is negative. No drift is seen.  Reflexes: Deep tendon reflexes are symmetric.   Assessment/Plan:  1. Myasthenia gravis  The patient appears to be doing quite well solely on prednisone. The prednisone dose should be tapered gradually over time, I would reduce his dose to 20 mg after 6 weeks. The patient should be back on Imuran unless there were tolerance issues or other contraindications to its use. The patient previously had severe symptoms with myasthenia gravis associated with respiratory compromise and dysphagia. He will followup in 4 months. If possible, the patient should be placed back on 50 mg twice daily of Imuran.  Victor Palau MD 03/16/2014 7:42 PM  [facility] [address] [place], [place] [address]  Phone [phone] Fax [phone]

## 2014-03-16 NOTE — Patient Instructions (Signed)
Myasthenia Gravis Myasthenia gravis is a disease that causes muscle weakness throughout the body. The muscles affected are the ones we can control (voluntary muscles). An example of a voluntary muscle is your hand muscles. You can control the muscles to make the hand pick something up. An example of an involuntary muscle is the heart. The heart beats without any direction from you.  Myasthenia Gravis is thought to be an autoimmune disease. That means that normal defenses of the body begin to attack the body. In this case, the immune system begins to attack cells located at the junctions of the muscles and the nerves. Women are affected more often. Women are affected at a younger age than men. Babies born to affected women frequently develop symptoms at an early age. SYMPTOMS Initially in the disease, the facial muscles are affected first. After this, a person may develop droopy eyelids. They may have difficulty controlling facial muscles. They may have problems chewing. Swallowing and speaking may become impaired. The weakness gradually spreads to the arms and legs. It begins to affect breathing. Sometimes, the symptoms lessen or go away without any apparent cause. DIAGNOSIS  Diagnosis can be made with blood tests. Tests such as electromyography may be done to examine the electrical activity in the muscle. An improvement in symptoms after having an anti-cholinesterase drug helps confirm the diagnosis.  TREATMENT  Medicines are usually prescribed as the first treatment. These medicines help, but they do not cure the disease. A plasma cleansing procedure (plasmapheresis) can be used to treat a crisis. It can also be used to prepare a person for surgery. This procedure produces short-term improvement. Some cases are helped by removing the thymus gland. Steroids are used for short-term benefits. Document Released: 09/03/2000 Document Revised: 08/20/2011 Document Reviewed: 07/29/2013 [employer] Patient  Information 2015 [employer]. This information is not intended to replace advice given to you by your health care provider. Make sure you discuss any questions you have with your health care provider.  

## 2014-03-31 ENCOUNTER — Non-Acute Institutional Stay (SKILLED_NURSING_FACILITY): Payer: Medicaid Other | Admitting: Internal Medicine

## 2014-03-31 DIAGNOSIS — G7000 Myasthenia gravis without (acute) exacerbation: Secondary | ICD-10-CM

## 2014-03-31 DIAGNOSIS — F22 Delusional disorders: Secondary | ICD-10-CM

## 2014-03-31 DIAGNOSIS — G4700 Insomnia, unspecified: Secondary | ICD-10-CM

## 2014-03-31 DIAGNOSIS — K219 Gastro-esophageal reflux disease without esophagitis: Secondary | ICD-10-CM

## 2014-04-03 NOTE — Progress Notes (Signed)
        PROGRESS NOTE  DATE: 03-31-14  FACILITY: Nursing Home [facility]  LEVEL OF CARE: SNF (31)  Routine Visit  CHIEF COMPLAINT:  Manage GERD, insomnia & myesthenia gravis  HISTORY OF PRESENT ILLNESS:  REASSESSMENT OF ONGOING PROBLEM(S):  INSOMNIA: The insomnia remains stable.  No complications noted from the medications presently being used. Patient denies ongoing insomnia, pain, hallucinations, delusions.  MYESTHENIA GRAVIS: pts myesthenia gravis is stable.  He denies progression of sx recently.  No complications from the medications currently being used.  GERD: pt's GERD is stable.  Denies ongoing heartburn, abd. Pain, nausea or vomiting.  Currently on a PPI & tolerates it without any adverse reactions.  PAST MEDICAL HISTORY : Reviewed.  No changes.  [facility]  REVIEW OF SYSTEMS:  GENERAL: no change in appetite, no fatigue, no weight changes, no fever, chills or weakness RESPIRATORY: no cough, SOB, DOE, wheezing, hemoptysis CARDIAC: no chest pain, edema or palpitations GI: no abdominal pain, diarrhea, constipation, heart burn, nausea or vomiting  PHYSICAL EXAMINATION  VS:  See vital signs section  GENERAL: no acute distress, normal body habitus NECK: supple, trachea midline, no neck masses, no thyroid tenderness, no thyromegaly RESPIRATORY: breathing is even & unlabored, BS CTAB CARDIAC: RRR, no murmur,no extra heart sounds, +2 right lower extremity edema GI: abdomen soft, normal BS, no masses, no tenderness, no hepatomegaly, no splenomegaly PSYCHIATRIC: the patient is alert & oriented to person, affect & behavior appropriate  LABS/RADIOLOGY: 10-15 mcv 100, plt 148, wbc 5.3, glc 128 ow cmp nl, TSH 1.6 9-15 MCV 101, platelets 148 otherwise CBC normal 8-15 MCV 100, platelets 117, WBC 6.3 normal hemoglobin 15.2, RBC folate 1036, vitamin B12 level 717, folate greater than 19.9 7-15 platelets 123 otherwise CBC  normal, CMP normal 6-15 hemoglobin A1c 5.7 3-15 platelets 134 otherwise CBC normal 2-15 platelets 137 otherwise CBC normal, total protein 5.8 otherwise liver profile normal, 2-15 hemoglobin A1c 5.5 11-14 BMP normal 9/14 cbc nl  8-14 liver profile normal, hemoglobin A1c 5.7  4/14 bmp nl 3/14 cbc nl, AST 50, ALT 96 ow cmp nl  ASSESSMENT/PLAN:  Myesthenia gravis-stable. Insomnia-denies on going sx. GERD-stable. Macrocytosis- RBC folate and vitamin B12 level normal Delusional d/o-stable. Thrombocytopenia-stable Schizophrenia-stable  CPT CODE: [id number]  Newton PiggGayani Y. Kerry Doryasanayaka, MD [facility] [phone]

## 2014-05-03 ENCOUNTER — Telehealth: Payer: Self-pay | Admitting: Neurology

## 2014-05-03 NOTE — Telephone Encounter (Signed)
Called Father of Patient and relayed that on his last visit he was put back on Imuran as a prevention of respiratory problems reccuring as he is tapered from prenisone.

## 2014-05-03 NOTE — Telephone Encounter (Signed)
Patient's father needs to know what medication was changed last time patient was in a couple weeks ago and why he taking it. Best number to call [phone]. It is okay to leave a detailed message at this number.

## 2014-07-19 ENCOUNTER — Encounter: Payer: Self-pay | Admitting: Neurology

## 2014-07-19 ENCOUNTER — Ambulatory Visit (INDEPENDENT_AMBULATORY_CARE_PROVIDER_SITE_OTHER): Payer: Medicaid Other | Admitting: Neurology

## 2014-07-19 VITALS — BP 141/91 | HR 96 | Ht 72.0 in | Wt 215.4 lb

## 2014-07-19 DIAGNOSIS — Z5181 Encounter for therapeutic drug level monitoring: Secondary | ICD-10-CM

## 2014-07-19 DIAGNOSIS — G7000 Myasthenia gravis without (acute) exacerbation: Secondary | ICD-10-CM

## 2014-07-19 NOTE — Progress Notes (Addendum)
Reason for visit: Myasthenia gravis  Victor Durham is an 59 y.o. male  History of present illness:  Mr. Muse is a 59 year old right-handed white male with myasthenia gravis with pharyngeal features. He has been reduced on the prednisone taking 20 mg daily, and he is on Imuran 50 mg twice daily. He is back on Mestinon taking 90 mg 5 times daily. The patient indicates that he is doing quite well with this regimen. He does not report double vision, ptosis of the eyelids, difficulty with speech or swallowing. He denies any weakness of the extremities. He is not having diarrhea or abdominal cramps on the current dose of the Mestinon. He continues to [facility] at the [facility]. The patient returns this office for an evaluation.  Past Medical History  Diagnosis Date  . DVT (deep venous thrombosis)   . Hypertension   . High cholesterol   . Myasthenia gravis   . Anxiety   . Dysphagia 06/30/12    Past Surgical History  Procedure Laterality Date  . Esophagogastroduodenoscopy  07/11/2012    Procedure: ESOPHAGOGASTRODUODENOSCOPY (EGD);  Surgeon: Shirley Friar, MD;  Location[facility];  Service: Endoscopy;  Laterality: N/A;  BEDSIDE  . Tracheostomy N/A   . Tee without cardioversion N/A 09/09/2012    Procedure: TRANSESOPHAGEAL ECHOCARDIOGRAM (TEE);  Surgeon: Dolores Patty, MD;  Location: [facility] ENDOSCOPY;  Service: Cardiovascular;  Laterality: N/A;    Family History  Problem Relation Age of Onset  . Myasthenia gravis Father     Social history:  reports that he has never smoked. He has never used smokeless tobacco. He reports that he does not drink alcohol or use illicit drugs.   No Known Allergies  Medications:  Current Outpatient Prescriptions on File Prior to Visit  Medication Sig Dispense Refill  . aspirin 81 MG chewable tablet Give 81 mg by tube daily.    Marland Kitchen azaTHIOprine (IMURAN) 50 MG tablet Take 2 tablets (100 mg total) by mouth  daily. 60 tablet 3  . ferrous sulfate 325 (65 FE) MG EC tablet Take 325 mg by mouth daily.    . vitamin C (ASCORBIC ACID) 500 MG tablet Take 500 mg by mouth daily.    Marland Kitchen zinc sulfate 220 MG capsule Take 220 mg by mouth daily.     No current facility-administered medications on file prior to visit.    ROS:  Out of a complete 14 system review of symptoms, the patient complains only of the following symptoms, and all other reviewed systems are negative.  The patient reports nothing on the review of systems.  Blood pressure 141/91, pulse 96, height 6' (1.829 m), weight 215 lb 6.4 oz (97.705 kg).  Physical Exam  General: The patient is alert and cooperative at the time of the examination.  Skin: No significant peripheral edema is noted.   Neurologic Exam  Mental status: The patient is oriented x 3.  Cranial nerves: Facial symmetry is present. Speech is normal, no aphasia or dysarthria is noted. Extraocular movements are full. Visual fields are full. With superior deviation of the eyes for 1 minute, no divergence of gaze, subjective double vision, or ptosis is seen.  Motor: The patient has good strength in all 4 extremities. With the arms outstretched 1 minute, no fatigable weakness of the deltoid muscles was seen.  Sensory examination: Soft touch sensation is symmetric on the face, arms, and legs.  Coordination: The patient has good finger-nose-finger and heel-to-shin bilaterally. The patient  has an intention tremor with finger-nose-finger bilaterally.  Gait and station: The patient has a normal gait. Tandem gait is normal. Romberg is negative. No drift is seen.  Reflexes: Deep tendon reflexes are symmetric.   Assessment/Plan:  1. Myasthenia gravis  The patient is doing quite well currently with his myasthenia. We will continue to reduce the prednisone, he will go to 17.5 mg daily for 6 weeks, then to 15 mg daily. He will follow-up in 4 months. Blood work will be done  today.  Marlan Palau. Keith Willis MD 07/19/2014 10:39 AM  [facility] [address] [place], [place] [address]  Phone [phone] Fax [phone]

## 2014-07-19 NOTE — Patient Instructions (Signed)
Myasthenia Gravis Myasthenia gravis is a disease that causes muscle weakness throughout the body. The muscles affected are the ones we can control (voluntary muscles). An example of a voluntary muscle is your hand muscles. You can control the muscles to make the hand pick something up. An example of an involuntary muscle is the heart. The heart beats without any direction from you.  Myasthenia Gravis is thought to be an autoimmune disease. That means that normal defenses of the body begin to attack the body. In this case, the immune system begins to attack cells located at the junctions of the muscles and the nerves. Women are affected more often. Women are affected at a younger age than men. Babies born to affected women frequently develop symptoms at an early age. SYMPTOMS Initially in the disease, the facial muscles are affected first. After this, a person may develop droopy eyelids. They may have difficulty controlling facial muscles. They may have problems chewing. Swallowing and speaking may become impaired. The weakness gradually spreads to the arms and legs. It begins to affect breathing. Sometimes, the symptoms lessen or go away without any apparent cause. DIAGNOSIS  Diagnosis can be made with blood tests. Tests such as electromyography may be done to examine the electrical activity in the muscle. An improvement in symptoms after having an anti-cholinesterase drug helps confirm the diagnosis.  TREATMENT  Medicines are usually prescribed as the first treatment. These medicines help, but they do not cure the disease. A plasma cleansing procedure (plasmapheresis) can be used to treat a crisis. It can also be used to prepare a person for surgery. This procedure produces short-term improvement. Some cases are helped by removing the thymus gland. Steroids are used for short-term benefits. Document Released: 09/03/2000 Document Revised: 08/20/2011 Document Reviewed: 07/29/2013 [employer] Patient  Information 2015 [employer]. This information is not intended to replace advice given to you by your health care provider. Make sure you discuss any questions you have with your health care provider.  

## 2014-07-20 LAB — COMPREHENSIVE METABOLIC PANEL
A/G RATIO: 2.1 (ref 1.1–2.5)
ALT: 18 IU/L (ref 0–44)
AST: 15 IU/L (ref 0–40)
Albumin: 4.8 g/dL (ref 3.5–5.5)
Alkaline Phosphatase: 49 IU/L (ref 39–117)
BUN/Creatinine Ratio: 21 — ABNORMAL HIGH (ref 9–20)
BUN: 20 mg/dL (ref 6–24)
Bilirubin Total: 0.8 mg/dL (ref 0.0–1.2)
CO2: 22 mmol/L (ref 18–29)
Calcium: 9.6 mg/dL (ref 8.7–10.2)
Chloride: 103 mmol/L (ref 97–108)
Creatinine, Ser: 0.95 mg/dL (ref 0.76–1.27)
GFR calc Af Amer: 102 mL/min/{1.73_m2} (ref >59)
GFR, EST NON AFRICAN AMERICAN: 88 mL/min/{1.73_m2} (ref >59)
Globulin, Total: 2.3 g/dL (ref 1.5–4.5)
Glucose: 101 mg/dL — ABNORMAL HIGH (ref 65–99)
POTASSIUM: 3.7 mmol/L (ref 3.5–5.2)
Sodium: 143 mmol/L (ref 134–144)
Total Protein: 7.1 g/dL (ref 6.0–8.5)

## 2014-07-20 LAB — CBC WITH DIFFERENTIAL
Basophils Absolute: 0 10*3/uL (ref 0.0–0.2)
Basos: 0 %
EOS: 1 %
Eosinophils Absolute: 0.1 10*3/uL (ref 0.0–0.4)
HCT: 48.6 % (ref 37.5–51.0)
Hemoglobin: 17.1 g/dL (ref 12.6–17.7)
IMMATURE GRANS (ABS): 0 10*3/uL (ref 0.0–0.1)
Immature Granulocytes: 0 %
Lymphocytes Absolute: 1 10*3/uL (ref 0.7–3.1)
Lymphs: 17 %
MCH: 34.7 pg — ABNORMAL HIGH (ref 26.6–33.0)
MCHC: 35.2 g/dL (ref 31.5–35.7)
MCV: 99 fL — ABNORMAL HIGH (ref 79–97)
MONOCYTES: 8 %
MONOS ABS: 0.5 10*3/uL (ref 0.1–0.9)
NEUTROS PCT: 74 %
Neutrophils Absolute: 4.4 10*3/uL (ref 1.4–7.0)
RBC: 4.93 x10E6/uL (ref 4.14–5.80)
RDW: 15.3 % (ref 12.3–15.4)
WBC: 6 10*3/uL (ref 3.4–10.8)

## 2014-07-22 NOTE — Progress Notes (Signed)
Quick Note:  [facility] to [facility] and relayed unremarkable blood work results, per Dr. Anne HahnWillis. I will also fax lab results. ______

## 2014-10-11 ENCOUNTER — Telehealth: Payer: Self-pay | Admitting: Family Medicine

## 2014-10-11 NOTE — Telephone Encounter (Signed)
Patient's Father came in this morning to set up his son for a new patient appointment; patient's father had wanted to speak with PCP before this initial visit to gain a common understanding; please f/u with patient's father about these concerns as patient has appointment tomorrow morning;

## 2014-10-11 NOTE — Telephone Encounter (Signed)
Patient and Father encouraged to come in together for visit. Please notify father that I cannot and will not discuss my patient with anyone without their consent as long as they are able to give consent.

## 2014-10-12 ENCOUNTER — Encounter: Payer: Self-pay | Admitting: Family Medicine

## 2014-10-12 ENCOUNTER — Ambulatory Visit: Payer: Medicaid Other | Attending: Family Medicine | Admitting: Family Medicine

## 2014-10-12 VITALS — BP 125/84 | HR 89 | Temp 99.2°F | Resp 18 | Ht 74.0 in | Wt 207.0 lb

## 2014-10-12 DIAGNOSIS — K429 Umbilical hernia without obstruction or gangrene: Secondary | ICD-10-CM | POA: Insufficient documentation

## 2014-10-12 DIAGNOSIS — Z833 Family history of diabetes mellitus: Secondary | ICD-10-CM

## 2014-10-12 DIAGNOSIS — B351 Tinea unguium: Secondary | ICD-10-CM

## 2014-10-12 DIAGNOSIS — Z7901 Long term (current) use of anticoagulants: Secondary | ICD-10-CM

## 2014-10-12 DIAGNOSIS — Z114 Encounter for screening for human immunodeficiency virus [HIV]: Secondary | ICD-10-CM | POA: Insufficient documentation

## 2014-10-12 DIAGNOSIS — L304 Erythema intertrigo: Secondary | ICD-10-CM | POA: Insufficient documentation

## 2014-10-12 DIAGNOSIS — I10 Essential (primary) hypertension: Secondary | ICD-10-CM

## 2014-10-12 DIAGNOSIS — R32 Unspecified urinary incontinence: Secondary | ICD-10-CM

## 2014-10-12 DIAGNOSIS — Z5181 Encounter for therapeutic drug level monitoring: Secondary | ICD-10-CM

## 2014-10-12 DIAGNOSIS — E7800 Pure hypercholesterolemia, unspecified: Secondary | ICD-10-CM

## 2014-10-12 DIAGNOSIS — F22 Delusional disorders: Secondary | ICD-10-CM

## 2014-10-12 LAB — COMPLETE METABOLIC PANEL WITH GFR
ALBUMIN: 4.3 g/dL (ref 3.5–5.2)
ALK PHOS: 37 U/L — AB (ref 39–117)
ALT: 18 U/L (ref 0–53)
AST: 15 U/L (ref 0–37)
BUN: 18 mg/dL (ref 6–23)
CO2: 22 mEq/L (ref 19–32)
CREATININE: 1.05 mg/dL (ref 0.50–1.35)
Calcium: 9.2 mg/dL (ref 8.4–10.5)
Chloride: 103 mEq/L (ref 96–112)
GFR, EST NON AFRICAN AMERICAN: 78 mL/min
GFR, Est African American: >89 mL/min
Glucose, Bld: 113 mg/dL — ABNORMAL HIGH (ref 70–99)
Potassium: 3.8 mEq/L (ref 3.5–5.3)
Sodium: 136 mEq/L (ref 135–145)
TOTAL PROTEIN: 7.1 g/dL (ref 6.0–8.3)
Total Bilirubin: 1 mg/dL (ref 0.2–1.2)

## 2014-10-12 LAB — POCT URINALYSIS DIPSTICK
GLUCOSE UA: NEGATIVE
Ketones, UA: NEGATIVE
Leukocytes, UA: NEGATIVE
Nitrite, UA: NEGATIVE
PH UA: 6
PROTEIN UA: 100
UROBILINOGEN UA: 0.2

## 2014-10-12 LAB — LIPID PANEL
Cholesterol: 209 mg/dL — ABNORMAL HIGH (ref 0–200)
HDL: 48 mg/dL (ref >=40)
LDL CALC: 102 mg/dL — AB (ref 0–99)
TRIGLYCERIDES: 297 mg/dL — AB (ref <150)
Total CHOL/HDL Ratio: 4.4 Ratio
VLDL: 59 mg/dL — ABNORMAL HIGH (ref 0–40)

## 2014-10-12 LAB — CBC WITH DIFFERENTIAL/PLATELET
BASOS ABS: 0 10*3/uL (ref 0.0–0.1)
Basophils Relative: 0 % (ref 0–1)
Eosinophils Absolute: 0 10*3/uL (ref 0.0–0.7)
Eosinophils Relative: 0 % (ref 0–5)
HCT: 46.1 % (ref 39.0–52.0)
HEMOGLOBIN: 15.7 g/dL (ref 13.0–17.0)
LYMPHS ABS: 0.3 10*3/uL — AB (ref 0.7–4.0)
Lymphocytes Relative: 4 % — ABNORMAL LOW (ref 12–46)
MCH: 33.7 pg (ref 26.0–34.0)
MCHC: 34.1 g/dL (ref 30.0–36.0)
MCV: 98.9 fL (ref 78.0–100.0)
MPV: 10.5 fL (ref 8.6–12.4)
Monocytes Absolute: 0.2 10*3/uL (ref 0.1–1.0)
Monocytes Relative: 3 % (ref 3–12)
NEUTROS ABS: 7.4 10*3/uL (ref 1.7–7.7)
Neutrophils Relative %: 93 % — ABNORMAL HIGH (ref 43–77)
PLATELETS: 150 10*3/uL (ref 150–400)
RBC: 4.66 MIL/uL (ref 4.22–5.81)
RDW: 15.1 % (ref 11.5–15.5)
WBC: 8 10*3/uL (ref 4.0–10.5)

## 2014-10-12 LAB — GLUCOSE, POCT (MANUAL RESULT ENTRY): POC Glucose: 109 mg/dl — AB (ref 70–99)

## 2014-10-12 LAB — POCT INR: INR: 1.8

## 2014-10-12 LAB — POCT GLYCOSYLATED HEMOGLOBIN (HGB A1C): HEMOGLOBIN A1C: 5.4

## 2014-10-12 MED ORDER — FLUCONAZOLE POWD: 1.0000 "application " | Freq: Two times a day (BID) | Status: DC

## 2014-10-12 NOTE — Progress Notes (Signed)
ASSESSMENT: Pt is currently experiencing symptoms of depression. He needs to continue taking his BH medications,  [facility]. He would benefit from psychoeducation and talk therapy to help him cope with symptoms of depression.  Stage of Change: contemplative  PLAN: 1. F/U with behavioral health consultant in 3 days for talk therapy 2. Psychiatric Medications: Risperdal and Desyrel, continue to take as prescribed 3. Behavioral recommendation(s):   -Continue taking daily walks -Consider reading through educational material about coping with depression -Consider asking sisters to plan a time to visit  SUBJECTIVE: Pt. referred by Dr Armen PickupFunches for symptoms of depression.  Pt. here for  REFERRAL regarding depression.  Pt. reports the following symptoms/concerns: Pt states that he has been feeling sad since moving into current home; he worked at [employer] for many years. Pt says he cannot think of anything specific that makes him feel better. He does admit that after walking for about 30 minutes/day, he does feel a little more energetic, but then goes back to sitting and watching TV. Pt states that he thinks that he would feel better if his two sisters would come to visit him, and that he would like to come back and talk some more. He says that he thinks being able to talk about what he is feeling would help him the most.  Duration of problem: <2 years Severity: moderate  OBJECTIVE: Orientation & Cognition: Oriented x3. Thought processes normal and appropriate to situation. Mood: low, teary Affect: appropriate Appearance: appropriate Risk of harm to self or others: no risk of harm to self or others Substance use: alcohol Psychiatric medication use: Unchanged from prior contact. Assessments administered: risk assessment  Diagnosis: Major depressive disorder, recurrent, unspecified CPT Code: F33.9 -------------------------------------------- Other(s) present in the room: Pt father  Time  spent with patient in exam room: 25 minutes

## 2014-10-12 NOTE — Progress Notes (Signed)
Establish Care Annual check up

## 2014-10-12 NOTE — Progress Notes (Signed)
Subjective:    Patient ID: Victor Durham, male    DOB: 1956/02/08, 59 y.o.   MRN: [id number] CC: new patient, wellness physical  HPI  Wellness visit: patient lives at nursing home since his severe illness requiring ICU care, trach, PEG in  2014. He brings in today a list of hsi medications. He has a history of schizophrenia on risperidone.  He has myasthenia gravis on azathioprine, prednisone and pyridostigmine. 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  Patient has no acute cares or concerns. He comes in today with hs dad who is concerned that he is depressed with flat affect, rarely smiles. Dad is only supportive family member. Patient does not have a relationship with his mother or siblings. Dad is also concerned that patient is wearing adult diapers.   Soc Hx: non smoker Surg Hx: s/p trach    Review of Systems Patient Information Form: Screening and ROS  AUDIT-C Score: 0 Do you feel safe in relationships?  PHQ-2:negative  Review of Symptoms  General:  Negative for nexplained weight loss, fever Skin: Negative for new or changing mole, sore that won't heal HEENT: Negative for trouble hearing, trouble seeing, ringing in ears, mouth sores, hoarseness, change in voice, dysphagia. CV:  Negative for chest pain, dyspnea, edema, palpitations Resp: Negative for cough, dyspnea, hemoptysis GI: Negative for nausea, vomiting, diarrhea, constipation, abdominal pain, melena, hematochezia. GU: Positive for urinary incontinence. Negative for dysuria,  urinary hesitance, hematuria, vaginal or penile discharge, polyuria, sexual difficulty, lumps in testicle or breasts MSK: Negative for muscle cramps or aches, joint pain or swelling Neuro: Negative for headaches, weakness, numbness, dizziness, passing out/fainting Psych: Positive for depression. Negative for anxiety, memory problems      Objective:   Physical Exam BP 125/84 mmHg  Pulse 89  Temp(Src) 99.2 F (37.3 C)  (Oral)  Resp 18  Ht  (1.88 m)  Wt 207 lb (93.895 kg)  BMI 26.57 kg/m2  SpO2 96%  General Appearance:    Alert, cooperative, no distress, appears stated age, affect is flat   Head:    Normocephalic, without obvious abnormality, atraumatic  Eyes:    PERRL, conjunctiva/corneas clear, EOM's intact, fundi    benign, both eyes       Ears:    Normal TM's and external ear canals, both ears  Nose:   Nares normal, septum midline, mucosa normal, no drainage   or sinus tenderness  Throat:   Lips, mucosa, and tongue normal; teeth and gums normal  Neck:   Supple, symmetrical, trachea midline, no adenopathy;       thyroid:  No enlargement/tenderness/nodules; no carotid   bruit or JVD  Back:     Symmetric, no curvature, ROM normal, no CVA tenderness  Lungs:     Clear to auscultation bilaterally, respirations unlabored  Chest wall:    No tenderness or deformity  Heart:    Regular rate and rhythm, S1 and S2 normal, no murmur, rub   or gallop  Abdomen:     Reducible umbilical hernia. Soft, non-tender, bowel sounds active all four quadrants,    no masses, no organomegaly  Genitalia:    Normal male without lesion, discharge or tenderness  Rectal:    Normal tone, prostate enlarged, no masses or tenderness;   guaiac negative stool  Extremities:   Extremities normal, atraumatic, no cyanosis or edema.   Toenails are thickened and yellow.   Pulses:   2+ and symmetric all extremities  Skin:  Erythematous atrophic rash under both axilla.   Lymph nodes:   Cervical, supraclavicular, and axillary nodes normal  Neurologic:   CNII-XII intact. Normal strength, sensation and reflexes      Throughout          Assessment & Plan:

## 2014-10-12 NOTE — Patient Instructions (Addendum)
Mr. Victor Durham,  Thank you for coming in today. It was a pleasure meeting you. I look forward to being your primary doctor.   See report for recommendations.   You will be called with lab results  F/u with me in 3 months  Dr. Armen PickupFunches

## 2014-10-13 LAB — URINE CULTURE
Colony Count: NO GROWTH
Organism ID, Bacteria: NO GROWTH

## 2014-10-13 LAB — URINALYSIS, MICROSCOPIC ONLY
Bacteria, UA: NONE SEEN
CASTS: NONE SEEN
CRYSTALS: NONE SEEN
RBC / HPF: >50 RBC/hpf — AB (ref <3)
Squamous Epithelial / LPF: NONE SEEN

## 2014-10-13 LAB — HIV ANTIBODY (ROUTINE TESTING W REFLEX): HIV: NONREACTIVE

## 2014-10-13 MED ORDER — ATORVASTATIN CALCIUM 20 MG PO TABS: 20.0000 mg | ORAL_TABLET | Freq: Every day | ORAL | Status: AC

## 2014-10-13 NOTE — Assessment & Plan Note (Signed)
A: elevated cholesterol with 10 yr CVD risk of 7.6% P: lipitor 20 mg daily ordered

## 2014-10-15 ENCOUNTER — Encounter: Payer: Self-pay | Admitting: Family Medicine

## 2014-10-15 ENCOUNTER — Ambulatory Visit: Payer: Medicaid Other | Attending: Family Medicine | Admitting: Clinical

## 2014-10-15 DIAGNOSIS — F3341 Major depressive disorder, recurrent, in partial remission: Secondary | ICD-10-CM

## 2014-10-15 MED ORDER — TERBINAFINE HCL 1 % EX CREA: 1.0000 "application " | TOPICAL_CREAM | Freq: Two times a day (BID) | CUTANEOUS | Status: DC

## 2014-10-15 NOTE — Assessment & Plan Note (Signed)
A: negative U Cx with blood on UA P: Repeat UA to check for blood if persistent obtain CT Ab and pelvis to check for stones vs bladder pathology

## 2014-10-15 NOTE — Assessment & Plan Note (Signed)
Screening HIV ordered and negative  

## 2014-10-15 NOTE — Assessment & Plan Note (Signed)
A: intertrigo under both axilla P: fluconazole powder

## 2014-10-15 NOTE — Progress Notes (Signed)
ASSESSMENT: Pt is currently experiencing a reduction in symptoms of depression.  Pt needs to continue taking his BH medications as prescribed, and continue talk therapy sessions. Pt would benefit by adhering to his action plan to increase his physical activity and social interaction, in order to continue to reduce symptoms of depression. Stage of Change: action  PLAN: 1. F/U with behavioral health consultant in 1 week 2. Psychiatric Medications: Risperdal and Desyrel, take as prescribed. 3. Behavioral recommendation(s):   -Increase walking to 40 minutes every other day -Talk to someone different every day  SUBJECTIVE: Pt. referred by self for talk therapy:  Pt. here for follow up regarding managing symptoms of depression.  Pt. reports the following symptoms/concerns: Pt states that he is feeling much better this week than the previous week. He states that his biggest issue this past week has been that his days are so routine, that there is no change in what he does every day. He says that he finds that he feels better when he walks, and that he would like to try walking a little more every other day. He also states that he does not talk to many people in the home where he lives, but would like to try to talk to more people, as he feels it would make him feel less depressed. Pt revealed that he really wants to move into his own place, and that when he has the money to do so, he will move. Pt also revealed that he experienced loss of unfaithful ex-wife to suicide prior to becoming sick and moving into current home.  Duration of problem: Two years Severity: moderate  OBJECTIVE: Orientation & Cognition: Oriented x3. Thought processes normal and appropriate to situation. Mood: appropriate. Affect: appropriate Appearance: appropriate Risk of harm to self or others: no risk of harm to self or others Substance use: none Psychiatric medication use: Unchanged from prior contact. Assessments  administered: PSQ9(3)  Diagnosis: Major depression, recurrent episode CPT Code: F33.9 -------------------------------------------- Other(s) present in the room: none  Time spent with patient in exam room: 40 minutes

## 2014-10-15 NOTE — Assessment & Plan Note (Signed)
A: reducible umbilical hernia P: Reassurance

## 2014-10-15 NOTE — Assessment & Plan Note (Signed)
A; patient on risperidone for presumed schizophrenia  P: Continue risperidone, patient referred to psychiatrist Also referred to in house clinical social worked

## 2014-10-15 NOTE — Assessment & Plan Note (Addendum)
A: toenail fungus, normal LFTs P: Cannot do oral lamisil with coumadin Topical lamisil ordered

## 2014-10-18 ENCOUNTER — Telehealth: Payer: Self-pay | Admitting: *Deleted

## 2014-10-18 NOTE — Telephone Encounter (Signed)
Unable to contact pt Both number listed disconnected

## 2014-10-18 NOTE — Telephone Encounter (Signed)
-----   Message from Dessa PhiJosalyn Funches, MD sent at 10/13/2014  8:44 AM EDT ----- Screening HIV negative There is blood on UA, awaiting urine culture. May need US of bladder and kidneys  Normal CBC, CMP Elevated cholesterol, lipitor 20 mg daily recommended

## 2014-10-18 NOTE — Telephone Encounter (Signed)
-----   Message from Dessa PhiJosalyn Funches, MD sent at 10/14/2014  9:09 AM EDT ----- Negative urine culture  Blood in urine on UA, will repeat UA if blood still present at f/u need CT abdomen and pelvis

## 2014-10-22 ENCOUNTER — Ambulatory Visit: Payer: Medicaid Other | Attending: Family Medicine | Admitting: Clinical

## 2014-10-22 DIAGNOSIS — F325 Major depressive disorder, single episode, in full remission: Secondary | ICD-10-CM

## 2014-10-22 NOTE — Progress Notes (Signed)
Expand All Collapse All              ASSESSMENT: Pt currently experiencing remission from symptoms of depression, he needs to continue taking his medications as prescribed, as well as continued talk therapy. Pt would benefit from continuing to follow the treatment plan he has created or himself to keep himself feeling well.  Stage of Change: action  PLAN: 1. F/U with behavioral health consultant in one  Week.  2. Psychiatric Medications: Same 3. Behavioral recommendation(s):   -Walk at least 40 min/day for at least three days in next week -Talk to someone different at least six days in next week -Consider talking to at least one new person this week (at least say hello) -Consider looking for his "best moment" of every day in next week   SUBJECTIVE: Pt. referred by self for F/U talk therapy:  Pt. here for follow-up regarding talk therapy to cope with depression.  Pt. reports the following symptoms/concerns: Pt states that he was able to walk for 40 minutes two times in the past week, and that he talked to a different person every day for five days in past week, and that he feels good about his accomplishment. He states that he is not really concerned about having his siblings come to visit right now; looks forward to seeing his father every week; gets along well with his fathers new girlfriend. Pt recognizes that he may be the only person who says hello to some people at his residence all week, and that he would like to try to say hello to someone new this week, as a way of reaching out to help others who may feel lonely.  Duration of problem: Two years Severity: mild  OBJECTIVE: Orientation & Cognition: Oriented x3. Thought processes normal and appropriate to situation. Mood: appropriate. Affect: appropriate Appearance: appropriate Risk of harm to self or others: no risk of harm to self or others Substance use: none Psychiatric medication use: Unchanged from prior  contact. Assessments administered: PSQ9(1)  Diagnosis: Major depression in remission CPT Code: F32.4 -------------------------------------------- Other(s) present in the room: none  Time spent with patient in exam room: 45 minutes

## 2014-10-29 ENCOUNTER — Ambulatory Visit: Payer: Medicaid Other | Attending: Family Medicine | Admitting: Clinical

## 2014-10-29 DIAGNOSIS — F325 Major depressive disorder, single episode, in full remission: Secondary | ICD-10-CM

## 2014-10-29 NOTE — Progress Notes (Signed)
ASSESSMENT: Pt currently experiencing remission from symptoms of depression, and motivation for continuing to follow his treatment plan to stay well. Pt needs to continue taking his medication as prescribed, and continue following his treatment plan. Pt would benefit from continuing to add additional activities to his treatment plan to keep depressive symptoms low.  Stage of Change: action  PLAN: 1. F/U with behavioral health consultant in one week.  2. Psychiatric Medications: same as previous, take as prescribed. 3. Behavioral recommendation(s):   -Continue walking 40 minutes every other day -Continue talking to a different person every day -Continue looking for best moment of each day -Do daily breathing exercise to at least count of 3 for relaxation -[facility] least one time upcoming week  SUBJECTIVE: Pt. referred by self for continued talk therapy:  Pt. here for follow-up regarding supportive therapy.  Pt. reports the following symptoms/concerns: Pt states that he continued to walk 40 minutes four times this past week; talked to a different person every day for past week. Pt says that his very best moment of the past week was waking up to sun shining through the window on Monday morning.  Duration of problem: two years Severity: mild  OBJECTIVE: Orientation & Cognition: Oriented x3. Thought processes normal and appropriate to situation. Mood: appropriate. Affect: appropriate Appearance: appropriate Risk of harm to self or others: no risk of harm to self or others Substance use: none Psychiatric medication use: Unchanged from prior contact. Assessments administered: none  Diagnosis: Major depression in remission CPT Code: F32.4 -------------------------------------------- Other(s) present in the room: none  Time spent with patient in exam room: 30 minutes

## 2014-11-05 ENCOUNTER — Ambulatory Visit: Payer: Medicaid Other | Attending: Family Medicine | Admitting: Clinical

## 2014-11-05 DIAGNOSIS — F325 Major depressive disorder, single episode, in full remission: Secondary | ICD-10-CM

## 2014-11-05 NOTE — Progress Notes (Signed)
ASSESSMENT: Pt continuing to experience remission of symptoms of depression, and motivation to continue following his treatment plan. Pt needs to continue to follow treatment plan.  Stage of Change: Action  PLAN: 1. F/U with behavioral health consultant in 2  weeks 2. Psychiatric Medications: same, continue. 3. Behavioral recommendation(s):   -Continue previous treatment plan -Consider exercise class as addition to treatment plan SUBJECTIVE: Pt. referred by self for symptoms of depression:  Pt. here for follow up regarding continuing treatment plan.  Pt. reports the following symptoms/concerns: Pt states that he is following his treatment plan exactly, and is ready to add going to weekly exercise class to plan. He states he is looking forward to seeing the sunshine through the window every morning, and is enjoying following his plan. Duration of problem: recent Severity: mile  OBJECTIVE: Orientation & Cognition: Oriented x3. Thought processes normal and appropriate to situation. Mood: appropriate. Affect: appropriate Appearance: appropriate Risk of harm to self or others: no risk of harm to self or others Substance use: none Psychiatric medication use: Unchanged from prior contact. Assessments administered: none  Diagnosis: Major Depression in Remission CPT Code: F32.4 -------------------------------------------- Other(s) present in the room: none  Time spent with patient in exam room: 30 minutes

## 2014-11-15 DIAGNOSIS — D649 Anemia, unspecified: Secondary | ICD-10-CM | POA: Diagnosis not present

## 2014-11-16 DIAGNOSIS — Z961 Presence of intraocular lens: Secondary | ICD-10-CM | POA: Diagnosis not present

## 2014-11-19 ENCOUNTER — Ambulatory Visit: Payer: Medicaid Other | Attending: Family Medicine | Admitting: Clinical

## 2014-11-19 DIAGNOSIS — F325 Major depressive disorder, single episode, in full remission: Secondary | ICD-10-CM

## 2014-11-19 NOTE — Progress Notes (Signed)
ASSESSMENT: Pt currently experiencing remission from symptoms of depression. Pt needs to continue to follow his treatment plan; would benefit from increasing his social activities. Stage of Change: action  PLAN: 1. F/U with behavioral health consultant in one month 2. Psychiatric Medications: Risperdal, Trazadone. 3. Behavioral recommendation(s):   -Continue to follow his previous activity plan: walking every other day for 40 minutes, talk to a different person each day of the week, do daily relaxation breathing exercises, look for the best moment in each day, and consider social activities provided at his home (coffee cafe, Bingo, exercise class) -Consider keeping track of his daily goals for a month from handout given in session  SUBJECTIVE: Pt. referred by self for continued remission of depression symptoms:  Pt. here for follow-up regarding continued remission of depression symptoms.  Pt. reports the following symptoms/concerns: Pt states that he has been successful in continuing to follow treatment plan. He is walking every other day for 40 minutes, he is talking to at least 7 different people each week, he is doing daily relaxation breathing exercises, and is looking out for his best moment of each day(continues to be the sunshine coming through the window each morning). He has gone to [employer], and says he was a bit awkward starting the exercise class, since he was the only person in class without a wheelchair, but he says he "pushed that aside" and did it anyway, and enjoyed the class. Pt states that he would like to use the monthly tracker to see what he is accomplishing.  Duration of problem: three weeks Severity: mild  OBJECTIVE: Orientation & Cognition: Oriented x3. Thought processes normal and appropriate to situation. Mood: appropriate Affect: appropriate Appearance: appropriate Risk of harm to self or others: no risk of harm to self or others Substance use: none Psychiatric  medication use: Unchanged from prior contact. Assessments administered: none  Diagnosis: Major depression in remission CPT Code: F32.4 -------------------------------------------- Other(s) present in the room: none  Time spent with patient in exam room: 40 minutes

## 2014-11-25 DIAGNOSIS — G4700 Insomnia, unspecified: Secondary | ICD-10-CM | POA: Diagnosis not present

## 2014-11-25 DIAGNOSIS — I82409 Acute embolism and thrombosis of unspecified deep veins of unspecified lower extremity: Secondary | ICD-10-CM | POA: Diagnosis not present

## 2014-11-25 DIAGNOSIS — G7000 Myasthenia gravis without (acute) exacerbation: Secondary | ICD-10-CM | POA: Diagnosis not present

## 2014-11-25 DIAGNOSIS — F0391 Unspecified dementia with behavioral disturbance: Secondary | ICD-10-CM | POA: Diagnosis not present

## 2014-12-14 DIAGNOSIS — D649 Anemia, unspecified: Secondary | ICD-10-CM | POA: Diagnosis not present

## 2014-12-17 ENCOUNTER — Ambulatory Visit: Payer: Medicare Other | Attending: Family Medicine | Admitting: Clinical

## 2014-12-17 DIAGNOSIS — F325 Major depressive disorder, single episode, in full remission: Secondary | ICD-10-CM

## 2014-12-17 NOTE — Progress Notes (Signed)
ASSESSMENT: Pt experiencing continued remission from symptoms of depression. Pt needs to continue to follow his treatment plan. Pt would benefit from continued supportive counseling to maintain remission of depressive symptoms.  Stage of Change: action  PLAN: 1. F/U with behavioral health consultant in 2 months 2. Psychiatric Medications: Risperdal, Trazadone. 3. Behavioral recommendation(s):   -Continue previous activity plan: walk every day for 40 minutes, talk to different person each day, daily relaxation breathing exercises, look for best moment each day, weekly Bingo, weekly exercise class -Add to plan: ask father to take him to church on Sunday mornings -Consider "back-up plan" if he is unable to follow any aspect of current plan  -Consider public speaking class -Continue maintaining daily log of activities completed SUBJECTIVE: Pt. referred by self  for continued remission of depression symptoms:  Pt. here for follow up regarding continued remission of depression symptoms.  Pt. reports the following symptoms/concerns: Pt states that he has done everything on his previous plan, except that he is now walking every day, rather than every other day. He says he would like to go to church with his father on Sundays, but hasn't asked his father. Pt says that he might like to [facility], to help him get over his fear of speaking in public.  Duration of problem: >2 months Severity: mild  OBJECTIVE: Orientation & Cognition: Oriented x3. Thought processes normal and appropriate to situation. Mood: appropriate, smiling and natural laughter greater than previous weeks. Affect: appropriate Appearance: appropriate Risk of harm to self or others: no risk of harm to self or others Substance use: none Psychiatric medication use: Unchanged from prior contact Assessments administered: n/a  Diagnosis: Major depression in remission CPT Code:  F32.4 -------------------------------------------- Other(s) present in the room: none  Time spent with patient in exam room: 30 minutes

## 2015-01-10 DIAGNOSIS — Z79899 Other long term (current) drug therapy: Secondary | ICD-10-CM | POA: Diagnosis not present

## 2015-01-10 DIAGNOSIS — D649 Anemia, unspecified: Secondary | ICD-10-CM | POA: Diagnosis not present

## 2015-01-20 DIAGNOSIS — F0391 Unspecified dementia with behavioral disturbance: Secondary | ICD-10-CM | POA: Diagnosis not present

## 2015-01-20 DIAGNOSIS — I82409 Acute embolism and thrombosis of unspecified deep veins of unspecified lower extremity: Secondary | ICD-10-CM | POA: Diagnosis not present

## 2015-01-20 DIAGNOSIS — G7000 Myasthenia gravis without (acute) exacerbation: Secondary | ICD-10-CM | POA: Diagnosis not present

## 2015-01-20 DIAGNOSIS — K219 Gastro-esophageal reflux disease without esophagitis: Secondary | ICD-10-CM | POA: Diagnosis not present

## 2015-02-15 DIAGNOSIS — D649 Anemia, unspecified: Secondary | ICD-10-CM | POA: Diagnosis not present

## 2015-02-18 ENCOUNTER — Ambulatory Visit: Payer: Medicare Other | Attending: Family Medicine | Admitting: Clinical

## 2015-02-18 DIAGNOSIS — F324 Major depressive disorder, single episode, in partial remission: Secondary | ICD-10-CM

## 2015-02-18 DIAGNOSIS — F325 Major depressive disorder, single episode, in full remission: Secondary | ICD-10-CM

## 2015-02-18 NOTE — Progress Notes (Signed)
ASSESSMENT: Pt currently experiencing confidence in decreased symptoms of depression; would benefit from continued supportive counseling regarding continuing treatment plan.  Stage of Change: action  PLAN: 1. F/U with behavioral health consultant in 4  months 2. Psychiatric Medications: Risperdal, Trazodone 3. Behavioral recommendation(s):   -Continue walking 40 minutes/day -Continue talking to different person every day/week -Continue to do daily relaxation breathing exercises -Continue weekly Bingo group -Go to church weekly in chapel -Consider public speaking class  SUBJECTIVE: Pt. referred by self and family for continued remission of depressive symptoms:  Pt. reports the following symptoms/concerns: Pt says he has followed the treatment plan. When exercise classes ended, he began doing 100 situps/day instead. He decided not to ask his father for a ride to church and is choosing to attend chapel services at his (nursing) home instead.  Duration of problem: 4 months  Severity: mild  OBJECTIVE: Orientation & Cognition: Oriented x3. Thought processes normal and appropriate to situation. Mood: appropriate. Affect: appropriate Appearance: appropriate Risk of harm to self or others: no risk of harm to self or others Substance use: none Assessments administered: PHQ9: 2/ GAD7: 0/ GAIN-SS: 0-0-0-4 (informal Life Stressors: score 357/high)  Diagnosis: Major depression in remission CPT Code: F32.4 -------------------------------------------- Other(s) present in the room: none  Time spent with patient in exam room: 45 minutes

## 2015-03-03 DIAGNOSIS — F0391 Unspecified dementia with behavioral disturbance: Secondary | ICD-10-CM | POA: Diagnosis not present

## 2015-03-03 DIAGNOSIS — I82409 Acute embolism and thrombosis of unspecified deep veins of unspecified lower extremity: Secondary | ICD-10-CM | POA: Diagnosis not present

## 2015-03-03 DIAGNOSIS — G7000 Myasthenia gravis without (acute) exacerbation: Secondary | ICD-10-CM | POA: Diagnosis not present

## 2015-03-03 DIAGNOSIS — K219 Gastro-esophageal reflux disease without esophagitis: Secondary | ICD-10-CM | POA: Diagnosis not present

## 2015-03-14 DIAGNOSIS — D649 Anemia, unspecified: Secondary | ICD-10-CM | POA: Diagnosis not present

## 2015-03-25 DIAGNOSIS — Z23 Encounter for immunization: Secondary | ICD-10-CM | POA: Diagnosis not present

## 2015-04-14 DIAGNOSIS — K429 Umbilical hernia without obstruction or gangrene: Secondary | ICD-10-CM | POA: Diagnosis not present

## 2015-04-14 DIAGNOSIS — G7000 Myasthenia gravis without (acute) exacerbation: Secondary | ICD-10-CM | POA: Diagnosis not present

## 2015-04-14 DIAGNOSIS — I82409 Acute embolism and thrombosis of unspecified deep veins of unspecified lower extremity: Secondary | ICD-10-CM | POA: Diagnosis not present

## 2015-04-14 DIAGNOSIS — F0390 Unspecified dementia without behavioral disturbance: Secondary | ICD-10-CM | POA: Diagnosis not present

## 2015-04-14 DIAGNOSIS — K219 Gastro-esophageal reflux disease without esophagitis: Secondary | ICD-10-CM | POA: Diagnosis not present

## 2015-04-18 DIAGNOSIS — D649 Anemia, unspecified: Secondary | ICD-10-CM | POA: Diagnosis not present

## 2015-04-18 DIAGNOSIS — Z79899 Other long term (current) drug therapy: Secondary | ICD-10-CM | POA: Diagnosis not present

## 2015-05-16 DIAGNOSIS — D649 Anemia, unspecified: Secondary | ICD-10-CM | POA: Diagnosis not present

## 2015-05-19 DIAGNOSIS — K219 Gastro-esophageal reflux disease without esophagitis: Secondary | ICD-10-CM | POA: Diagnosis not present

## 2015-05-19 DIAGNOSIS — F0391 Unspecified dementia with behavioral disturbance: Secondary | ICD-10-CM | POA: Diagnosis not present

## 2015-05-19 DIAGNOSIS — I82409 Acute embolism and thrombosis of unspecified deep veins of unspecified lower extremity: Secondary | ICD-10-CM | POA: Diagnosis not present

## 2015-05-19 DIAGNOSIS — G7000 Myasthenia gravis without (acute) exacerbation: Secondary | ICD-10-CM | POA: Diagnosis not present

## 2015-06-09 DIAGNOSIS — R41841 Cognitive communication deficit: Secondary | ICD-10-CM | POA: Diagnosis not present

## 2015-06-09 DIAGNOSIS — F0280 Dementia in other diseases classified elsewhere without behavioral disturbance: Secondary | ICD-10-CM | POA: Diagnosis not present

## 2015-06-09 DIAGNOSIS — G7000 Myasthenia gravis without (acute) exacerbation: Secondary | ICD-10-CM | POA: Diagnosis not present

## 2015-06-10 DIAGNOSIS — F0280 Dementia in other diseases classified elsewhere without behavioral disturbance: Secondary | ICD-10-CM | POA: Diagnosis not present

## 2015-06-10 DIAGNOSIS — G7000 Myasthenia gravis without (acute) exacerbation: Secondary | ICD-10-CM | POA: Diagnosis not present

## 2015-06-10 DIAGNOSIS — R41841 Cognitive communication deficit: Secondary | ICD-10-CM | POA: Diagnosis not present

## 2015-06-13 DIAGNOSIS — G7000 Myasthenia gravis without (acute) exacerbation: Secondary | ICD-10-CM | POA: Diagnosis not present

## 2015-06-13 DIAGNOSIS — R41841 Cognitive communication deficit: Secondary | ICD-10-CM | POA: Diagnosis not present

## 2015-06-13 DIAGNOSIS — F0280 Dementia in other diseases classified elsewhere without behavioral disturbance: Secondary | ICD-10-CM | POA: Diagnosis not present

## 2015-06-14 DIAGNOSIS — F0280 Dementia in other diseases classified elsewhere without behavioral disturbance: Secondary | ICD-10-CM | POA: Diagnosis not present

## 2015-06-14 DIAGNOSIS — D649 Anemia, unspecified: Secondary | ICD-10-CM | POA: Diagnosis not present

## 2015-06-14 DIAGNOSIS — G7000 Myasthenia gravis without (acute) exacerbation: Secondary | ICD-10-CM | POA: Diagnosis not present

## 2015-06-14 DIAGNOSIS — R41841 Cognitive communication deficit: Secondary | ICD-10-CM | POA: Diagnosis not present

## 2015-06-15 DIAGNOSIS — R41841 Cognitive communication deficit: Secondary | ICD-10-CM | POA: Diagnosis not present

## 2015-06-15 DIAGNOSIS — F0280 Dementia in other diseases classified elsewhere without behavioral disturbance: Secondary | ICD-10-CM | POA: Diagnosis not present

## 2015-06-15 DIAGNOSIS — G7000 Myasthenia gravis without (acute) exacerbation: Secondary | ICD-10-CM | POA: Diagnosis not present

## 2015-06-16 DIAGNOSIS — F0280 Dementia in other diseases classified elsewhere without behavioral disturbance: Secondary | ICD-10-CM | POA: Diagnosis not present

## 2015-06-16 DIAGNOSIS — R41841 Cognitive communication deficit: Secondary | ICD-10-CM | POA: Diagnosis not present

## 2015-06-16 DIAGNOSIS — G7000 Myasthenia gravis without (acute) exacerbation: Secondary | ICD-10-CM | POA: Diagnosis not present

## 2015-06-17 DIAGNOSIS — R41841 Cognitive communication deficit: Secondary | ICD-10-CM | POA: Diagnosis not present

## 2015-06-17 DIAGNOSIS — F0280 Dementia in other diseases classified elsewhere without behavioral disturbance: Secondary | ICD-10-CM | POA: Diagnosis not present

## 2015-06-17 DIAGNOSIS — G7000 Myasthenia gravis without (acute) exacerbation: Secondary | ICD-10-CM | POA: Diagnosis not present

## 2015-06-20 DIAGNOSIS — R41841 Cognitive communication deficit: Secondary | ICD-10-CM | POA: Diagnosis not present

## 2015-06-20 DIAGNOSIS — G7000 Myasthenia gravis without (acute) exacerbation: Secondary | ICD-10-CM | POA: Diagnosis not present

## 2015-06-20 DIAGNOSIS — F0280 Dementia in other diseases classified elsewhere without behavioral disturbance: Secondary | ICD-10-CM | POA: Diagnosis not present

## 2015-06-21 DIAGNOSIS — R41841 Cognitive communication deficit: Secondary | ICD-10-CM | POA: Diagnosis not present

## 2015-06-21 DIAGNOSIS — G7000 Myasthenia gravis without (acute) exacerbation: Secondary | ICD-10-CM | POA: Diagnosis not present

## 2015-06-21 DIAGNOSIS — F0280 Dementia in other diseases classified elsewhere without behavioral disturbance: Secondary | ICD-10-CM | POA: Diagnosis not present

## 2015-06-22 DIAGNOSIS — F0280 Dementia in other diseases classified elsewhere without behavioral disturbance: Secondary | ICD-10-CM | POA: Diagnosis not present

## 2015-06-22 DIAGNOSIS — G7000 Myasthenia gravis without (acute) exacerbation: Secondary | ICD-10-CM | POA: Diagnosis not present

## 2015-06-22 DIAGNOSIS — R41841 Cognitive communication deficit: Secondary | ICD-10-CM | POA: Diagnosis not present

## 2015-06-23 DIAGNOSIS — G7000 Myasthenia gravis without (acute) exacerbation: Secondary | ICD-10-CM | POA: Diagnosis not present

## 2015-06-23 DIAGNOSIS — F0280 Dementia in other diseases classified elsewhere without behavioral disturbance: Secondary | ICD-10-CM | POA: Diagnosis not present

## 2015-06-23 DIAGNOSIS — R41841 Cognitive communication deficit: Secondary | ICD-10-CM | POA: Diagnosis not present

## 2015-06-24 DIAGNOSIS — R41841 Cognitive communication deficit: Secondary | ICD-10-CM | POA: Diagnosis not present

## 2015-06-24 DIAGNOSIS — F0280 Dementia in other diseases classified elsewhere without behavioral disturbance: Secondary | ICD-10-CM | POA: Diagnosis not present

## 2015-06-24 DIAGNOSIS — G7000 Myasthenia gravis without (acute) exacerbation: Secondary | ICD-10-CM | POA: Diagnosis not present

## 2015-06-27 DIAGNOSIS — G7000 Myasthenia gravis without (acute) exacerbation: Secondary | ICD-10-CM | POA: Diagnosis not present

## 2015-06-27 DIAGNOSIS — F0280 Dementia in other diseases classified elsewhere without behavioral disturbance: Secondary | ICD-10-CM | POA: Diagnosis not present

## 2015-06-27 DIAGNOSIS — R41841 Cognitive communication deficit: Secondary | ICD-10-CM | POA: Diagnosis not present

## 2015-06-28 DIAGNOSIS — R41841 Cognitive communication deficit: Secondary | ICD-10-CM | POA: Diagnosis not present

## 2015-06-28 DIAGNOSIS — F0280 Dementia in other diseases classified elsewhere without behavioral disturbance: Secondary | ICD-10-CM | POA: Diagnosis not present

## 2015-06-28 DIAGNOSIS — G7000 Myasthenia gravis without (acute) exacerbation: Secondary | ICD-10-CM | POA: Diagnosis not present

## 2015-06-29 DIAGNOSIS — G7000 Myasthenia gravis without (acute) exacerbation: Secondary | ICD-10-CM | POA: Diagnosis not present

## 2015-06-29 DIAGNOSIS — F0280 Dementia in other diseases classified elsewhere without behavioral disturbance: Secondary | ICD-10-CM | POA: Diagnosis not present

## 2015-06-29 DIAGNOSIS — R41841 Cognitive communication deficit: Secondary | ICD-10-CM | POA: Diagnosis not present

## 2015-06-30 DIAGNOSIS — F0391 Unspecified dementia with behavioral disturbance: Secondary | ICD-10-CM | POA: Diagnosis not present

## 2015-06-30 DIAGNOSIS — G7000 Myasthenia gravis without (acute) exacerbation: Secondary | ICD-10-CM | POA: Diagnosis not present

## 2015-06-30 DIAGNOSIS — K219 Gastro-esophageal reflux disease without esophagitis: Secondary | ICD-10-CM | POA: Diagnosis not present

## 2015-06-30 DIAGNOSIS — K429 Umbilical hernia without obstruction or gangrene: Secondary | ICD-10-CM | POA: Diagnosis not present

## 2015-06-30 DIAGNOSIS — I82409 Acute embolism and thrombosis of unspecified deep veins of unspecified lower extremity: Secondary | ICD-10-CM | POA: Diagnosis not present

## 2015-07-18 DIAGNOSIS — Z79899 Other long term (current) drug therapy: Secondary | ICD-10-CM | POA: Diagnosis not present

## 2015-07-18 DIAGNOSIS — D649 Anemia, unspecified: Secondary | ICD-10-CM | POA: Diagnosis not present

## 2015-07-28 DIAGNOSIS — K219 Gastro-esophageal reflux disease without esophagitis: Secondary | ICD-10-CM | POA: Diagnosis not present

## 2015-07-28 DIAGNOSIS — G7000 Myasthenia gravis without (acute) exacerbation: Secondary | ICD-10-CM | POA: Diagnosis not present

## 2015-07-28 DIAGNOSIS — F0391 Unspecified dementia with behavioral disturbance: Secondary | ICD-10-CM | POA: Diagnosis not present

## 2015-07-28 DIAGNOSIS — I82409 Acute embolism and thrombosis of unspecified deep veins of unspecified lower extremity: Secondary | ICD-10-CM | POA: Diagnosis not present

## 2015-08-16 DIAGNOSIS — D649 Anemia, unspecified: Secondary | ICD-10-CM | POA: Diagnosis not present

## 2015-08-25 DIAGNOSIS — G7000 Myasthenia gravis without (acute) exacerbation: Secondary | ICD-10-CM | POA: Diagnosis not present

## 2015-08-25 DIAGNOSIS — F0391 Unspecified dementia with behavioral disturbance: Secondary | ICD-10-CM | POA: Diagnosis not present

## 2015-08-25 DIAGNOSIS — K219 Gastro-esophageal reflux disease without esophagitis: Secondary | ICD-10-CM | POA: Diagnosis not present

## 2015-08-25 DIAGNOSIS — I82409 Acute embolism and thrombosis of unspecified deep veins of unspecified lower extremity: Secondary | ICD-10-CM | POA: Diagnosis not present

## 2015-09-12 DIAGNOSIS — D649 Anemia, unspecified: Secondary | ICD-10-CM | POA: Diagnosis not present

## 2015-09-29 DIAGNOSIS — K219 Gastro-esophageal reflux disease without esophagitis: Secondary | ICD-10-CM | POA: Diagnosis not present

## 2015-09-29 DIAGNOSIS — F0390 Unspecified dementia without behavioral disturbance: Secondary | ICD-10-CM | POA: Diagnosis not present

## 2015-09-29 DIAGNOSIS — G7000 Myasthenia gravis without (acute) exacerbation: Secondary | ICD-10-CM | POA: Diagnosis not present

## 2015-09-29 DIAGNOSIS — I82409 Acute embolism and thrombosis of unspecified deep veins of unspecified lower extremity: Secondary | ICD-10-CM | POA: Diagnosis not present

## 2015-10-10 DIAGNOSIS — Z79899 Other long term (current) drug therapy: Secondary | ICD-10-CM | POA: Diagnosis not present

## 2015-10-10 DIAGNOSIS — D649 Anemia, unspecified: Secondary | ICD-10-CM | POA: Diagnosis not present

## 2015-10-21 DIAGNOSIS — R41841 Cognitive communication deficit: Secondary | ICD-10-CM | POA: Diagnosis not present

## 2015-10-21 DIAGNOSIS — R1312 Dysphagia, oropharyngeal phase: Secondary | ICD-10-CM | POA: Diagnosis not present

## 2015-10-21 DIAGNOSIS — G7001 Myasthenia gravis with (acute) exacerbation: Secondary | ICD-10-CM | POA: Diagnosis not present

## 2015-10-21 DIAGNOSIS — M6281 Muscle weakness (generalized): Secondary | ICD-10-CM | POA: Diagnosis not present

## 2015-10-21 DIAGNOSIS — S2241XA Multiple fractures of ribs, right side, initial encounter for closed fracture: Secondary | ICD-10-CM | POA: Diagnosis not present

## 2015-10-21 DIAGNOSIS — R279 Unspecified lack of coordination: Secondary | ICD-10-CM | POA: Diagnosis not present

## 2015-10-21 DIAGNOSIS — J9601 Acute respiratory failure with hypoxia: Secondary | ICD-10-CM | POA: Diagnosis not present

## 2015-10-21 DIAGNOSIS — J9690 Respiratory failure, unspecified, unspecified whether with hypoxia or hypercapnia: Secondary | ICD-10-CM | POA: Diagnosis not present

## 2015-10-21 DIAGNOSIS — S42201S Unspecified fracture of upper end of right humerus, sequela: Secondary | ICD-10-CM | POA: Diagnosis not present

## 2015-10-24 DIAGNOSIS — R1312 Dysphagia, oropharyngeal phase: Secondary | ICD-10-CM | POA: Diagnosis not present

## 2015-10-24 DIAGNOSIS — J9601 Acute respiratory failure with hypoxia: Secondary | ICD-10-CM | POA: Diagnosis not present

## 2015-10-24 DIAGNOSIS — M6281 Muscle weakness (generalized): Secondary | ICD-10-CM | POA: Diagnosis not present

## 2015-10-24 DIAGNOSIS — R41841 Cognitive communication deficit: Secondary | ICD-10-CM | POA: Diagnosis not present

## 2015-10-24 DIAGNOSIS — R279 Unspecified lack of coordination: Secondary | ICD-10-CM | POA: Diagnosis not present

## 2015-10-24 DIAGNOSIS — J9690 Respiratory failure, unspecified, unspecified whether with hypoxia or hypercapnia: Secondary | ICD-10-CM | POA: Diagnosis not present

## 2015-10-25 DIAGNOSIS — R279 Unspecified lack of coordination: Secondary | ICD-10-CM | POA: Diagnosis not present

## 2015-10-25 DIAGNOSIS — M6281 Muscle weakness (generalized): Secondary | ICD-10-CM | POA: Diagnosis not present

## 2015-10-25 DIAGNOSIS — J9690 Respiratory failure, unspecified, unspecified whether with hypoxia or hypercapnia: Secondary | ICD-10-CM | POA: Diagnosis not present

## 2015-10-25 DIAGNOSIS — J9601 Acute respiratory failure with hypoxia: Secondary | ICD-10-CM | POA: Diagnosis not present

## 2015-10-25 DIAGNOSIS — R1312 Dysphagia, oropharyngeal phase: Secondary | ICD-10-CM | POA: Diagnosis not present

## 2015-10-25 DIAGNOSIS — R41841 Cognitive communication deficit: Secondary | ICD-10-CM | POA: Diagnosis not present

## 2015-10-26 DIAGNOSIS — R279 Unspecified lack of coordination: Secondary | ICD-10-CM | POA: Diagnosis not present

## 2015-10-26 DIAGNOSIS — J9601 Acute respiratory failure with hypoxia: Secondary | ICD-10-CM | POA: Diagnosis not present

## 2015-10-26 DIAGNOSIS — R1312 Dysphagia, oropharyngeal phase: Secondary | ICD-10-CM | POA: Diagnosis not present

## 2015-10-26 DIAGNOSIS — R41841 Cognitive communication deficit: Secondary | ICD-10-CM | POA: Diagnosis not present

## 2015-10-26 DIAGNOSIS — M6281 Muscle weakness (generalized): Secondary | ICD-10-CM | POA: Diagnosis not present

## 2015-10-26 DIAGNOSIS — J9690 Respiratory failure, unspecified, unspecified whether with hypoxia or hypercapnia: Secondary | ICD-10-CM | POA: Diagnosis not present

## 2015-10-27 DIAGNOSIS — R1310 Dysphagia, unspecified: Secondary | ICD-10-CM | POA: Diagnosis not present

## 2015-10-27 DIAGNOSIS — R1312 Dysphagia, oropharyngeal phase: Secondary | ICD-10-CM | POA: Diagnosis not present

## 2015-10-27 DIAGNOSIS — J9690 Respiratory failure, unspecified, unspecified whether with hypoxia or hypercapnia: Secondary | ICD-10-CM | POA: Diagnosis not present

## 2015-10-27 DIAGNOSIS — R41841 Cognitive communication deficit: Secondary | ICD-10-CM | POA: Diagnosis not present

## 2015-10-27 DIAGNOSIS — G7000 Myasthenia gravis without (acute) exacerbation: Secondary | ICD-10-CM | POA: Diagnosis not present

## 2015-10-27 DIAGNOSIS — R279 Unspecified lack of coordination: Secondary | ICD-10-CM | POA: Diagnosis not present

## 2015-10-27 DIAGNOSIS — M6281 Muscle weakness (generalized): Secondary | ICD-10-CM | POA: Diagnosis not present

## 2015-10-27 DIAGNOSIS — J9601 Acute respiratory failure with hypoxia: Secondary | ICD-10-CM | POA: Diagnosis not present

## 2015-10-28 DIAGNOSIS — R279 Unspecified lack of coordination: Secondary | ICD-10-CM | POA: Diagnosis not present

## 2015-10-28 DIAGNOSIS — J9690 Respiratory failure, unspecified, unspecified whether with hypoxia or hypercapnia: Secondary | ICD-10-CM | POA: Diagnosis not present

## 2015-10-28 DIAGNOSIS — R1312 Dysphagia, oropharyngeal phase: Secondary | ICD-10-CM | POA: Diagnosis not present

## 2015-10-28 DIAGNOSIS — M6281 Muscle weakness (generalized): Secondary | ICD-10-CM | POA: Diagnosis not present

## 2015-10-28 DIAGNOSIS — R41841 Cognitive communication deficit: Secondary | ICD-10-CM | POA: Diagnosis not present

## 2015-10-28 DIAGNOSIS — J9601 Acute respiratory failure with hypoxia: Secondary | ICD-10-CM | POA: Diagnosis not present

## 2015-10-31 ENCOUNTER — Telehealth: Payer: Self-pay | Admitting: *Deleted

## 2015-10-31 DIAGNOSIS — R1312 Dysphagia, oropharyngeal phase: Secondary | ICD-10-CM | POA: Diagnosis not present

## 2015-10-31 DIAGNOSIS — J9601 Acute respiratory failure with hypoxia: Secondary | ICD-10-CM | POA: Diagnosis not present

## 2015-10-31 DIAGNOSIS — R279 Unspecified lack of coordination: Secondary | ICD-10-CM | POA: Diagnosis not present

## 2015-10-31 DIAGNOSIS — M6281 Muscle weakness (generalized): Secondary | ICD-10-CM | POA: Diagnosis not present

## 2015-10-31 DIAGNOSIS — R41841 Cognitive communication deficit: Secondary | ICD-10-CM | POA: Diagnosis not present

## 2015-10-31 DIAGNOSIS — J9690 Respiratory failure, unspecified, unspecified whether with hypoxia or hypercapnia: Secondary | ICD-10-CM | POA: Diagnosis not present

## 2015-10-31 NOTE — Telephone Encounter (Signed)
error 

## 2015-10-31 NOTE — Telephone Encounter (Signed)
Taken 22-MAY-17 at  1:43PM by DRS ------------------------------------------------------------ Marcella Dubsaller NICOLE/[facility]         CID [id number]  Patient Victor ParmaHOMAS Mahan   Pt's Dr NOT SURE     Area Code 336 Phone# [phone] * DOB [birth year]    RE NEEDS TO KNOW DATE AND TIME OF PATIENTS NEXT      APPT                                                 Disp:Y/N N If Y = C/B If No Response In 20minutes ============================================================

## 2015-11-01 DIAGNOSIS — J9601 Acute respiratory failure with hypoxia: Secondary | ICD-10-CM | POA: Diagnosis not present

## 2015-11-01 DIAGNOSIS — R1312 Dysphagia, oropharyngeal phase: Secondary | ICD-10-CM | POA: Diagnosis not present

## 2015-11-01 DIAGNOSIS — R41841 Cognitive communication deficit: Secondary | ICD-10-CM | POA: Diagnosis not present

## 2015-11-01 DIAGNOSIS — M6281 Muscle weakness (generalized): Secondary | ICD-10-CM | POA: Diagnosis not present

## 2015-11-01 DIAGNOSIS — J9690 Respiratory failure, unspecified, unspecified whether with hypoxia or hypercapnia: Secondary | ICD-10-CM | POA: Diagnosis not present

## 2015-11-01 DIAGNOSIS — R279 Unspecified lack of coordination: Secondary | ICD-10-CM | POA: Diagnosis not present

## 2015-11-02 DIAGNOSIS — R279 Unspecified lack of coordination: Secondary | ICD-10-CM | POA: Diagnosis not present

## 2015-11-02 DIAGNOSIS — R41841 Cognitive communication deficit: Secondary | ICD-10-CM | POA: Diagnosis not present

## 2015-11-02 DIAGNOSIS — R1312 Dysphagia, oropharyngeal phase: Secondary | ICD-10-CM | POA: Diagnosis not present

## 2015-11-02 DIAGNOSIS — J9690 Respiratory failure, unspecified, unspecified whether with hypoxia or hypercapnia: Secondary | ICD-10-CM | POA: Diagnosis not present

## 2015-11-02 DIAGNOSIS — J9601 Acute respiratory failure with hypoxia: Secondary | ICD-10-CM | POA: Diagnosis not present

## 2015-11-02 DIAGNOSIS — M6281 Muscle weakness (generalized): Secondary | ICD-10-CM | POA: Diagnosis not present

## 2015-11-03 DIAGNOSIS — R1312 Dysphagia, oropharyngeal phase: Secondary | ICD-10-CM | POA: Diagnosis not present

## 2015-11-03 DIAGNOSIS — K219 Gastro-esophageal reflux disease without esophagitis: Secondary | ICD-10-CM | POA: Diagnosis not present

## 2015-11-03 DIAGNOSIS — R279 Unspecified lack of coordination: Secondary | ICD-10-CM | POA: Diagnosis not present

## 2015-11-03 DIAGNOSIS — F0391 Unspecified dementia with behavioral disturbance: Secondary | ICD-10-CM | POA: Diagnosis not present

## 2015-11-03 DIAGNOSIS — R41841 Cognitive communication deficit: Secondary | ICD-10-CM | POA: Diagnosis not present

## 2015-11-03 DIAGNOSIS — J9601 Acute respiratory failure with hypoxia: Secondary | ICD-10-CM | POA: Diagnosis not present

## 2015-11-03 DIAGNOSIS — J9690 Respiratory failure, unspecified, unspecified whether with hypoxia or hypercapnia: Secondary | ICD-10-CM | POA: Diagnosis not present

## 2015-11-03 DIAGNOSIS — I82409 Acute embolism and thrombosis of unspecified deep veins of unspecified lower extremity: Secondary | ICD-10-CM | POA: Diagnosis not present

## 2015-11-03 DIAGNOSIS — G7000 Myasthenia gravis without (acute) exacerbation: Secondary | ICD-10-CM | POA: Diagnosis not present

## 2015-11-03 DIAGNOSIS — M6281 Muscle weakness (generalized): Secondary | ICD-10-CM | POA: Diagnosis not present

## 2015-11-04 DIAGNOSIS — J9601 Acute respiratory failure with hypoxia: Secondary | ICD-10-CM | POA: Diagnosis not present

## 2015-11-04 DIAGNOSIS — R1312 Dysphagia, oropharyngeal phase: Secondary | ICD-10-CM | POA: Diagnosis not present

## 2015-11-04 DIAGNOSIS — R279 Unspecified lack of coordination: Secondary | ICD-10-CM | POA: Diagnosis not present

## 2015-11-04 DIAGNOSIS — R41841 Cognitive communication deficit: Secondary | ICD-10-CM | POA: Diagnosis not present

## 2015-11-04 DIAGNOSIS — M6281 Muscle weakness (generalized): Secondary | ICD-10-CM | POA: Diagnosis not present

## 2015-11-04 DIAGNOSIS — J9690 Respiratory failure, unspecified, unspecified whether with hypoxia or hypercapnia: Secondary | ICD-10-CM | POA: Diagnosis not present

## 2015-11-07 DIAGNOSIS — R1312 Dysphagia, oropharyngeal phase: Secondary | ICD-10-CM | POA: Diagnosis not present

## 2015-11-07 DIAGNOSIS — J9690 Respiratory failure, unspecified, unspecified whether with hypoxia or hypercapnia: Secondary | ICD-10-CM | POA: Diagnosis not present

## 2015-11-07 DIAGNOSIS — R41841 Cognitive communication deficit: Secondary | ICD-10-CM | POA: Diagnosis not present

## 2015-11-07 DIAGNOSIS — M6281 Muscle weakness (generalized): Secondary | ICD-10-CM | POA: Diagnosis not present

## 2015-11-07 DIAGNOSIS — J9601 Acute respiratory failure with hypoxia: Secondary | ICD-10-CM | POA: Diagnosis not present

## 2015-11-07 DIAGNOSIS — R279 Unspecified lack of coordination: Secondary | ICD-10-CM | POA: Diagnosis not present

## 2015-11-08 DIAGNOSIS — R41841 Cognitive communication deficit: Secondary | ICD-10-CM | POA: Diagnosis not present

## 2015-11-08 DIAGNOSIS — J9690 Respiratory failure, unspecified, unspecified whether with hypoxia or hypercapnia: Secondary | ICD-10-CM | POA: Diagnosis not present

## 2015-11-08 DIAGNOSIS — R1312 Dysphagia, oropharyngeal phase: Secondary | ICD-10-CM | POA: Diagnosis not present

## 2015-11-08 DIAGNOSIS — J9601 Acute respiratory failure with hypoxia: Secondary | ICD-10-CM | POA: Diagnosis not present

## 2015-11-08 DIAGNOSIS — M6281 Muscle weakness (generalized): Secondary | ICD-10-CM | POA: Diagnosis not present

## 2015-11-08 DIAGNOSIS — R279 Unspecified lack of coordination: Secondary | ICD-10-CM | POA: Diagnosis not present

## 2015-11-09 DIAGNOSIS — J9690 Respiratory failure, unspecified, unspecified whether with hypoxia or hypercapnia: Secondary | ICD-10-CM | POA: Diagnosis not present

## 2015-11-09 DIAGNOSIS — R279 Unspecified lack of coordination: Secondary | ICD-10-CM | POA: Diagnosis not present

## 2015-11-09 DIAGNOSIS — R41841 Cognitive communication deficit: Secondary | ICD-10-CM | POA: Diagnosis not present

## 2015-11-09 DIAGNOSIS — M6281 Muscle weakness (generalized): Secondary | ICD-10-CM | POA: Diagnosis not present

## 2015-11-09 DIAGNOSIS — R1312 Dysphagia, oropharyngeal phase: Secondary | ICD-10-CM | POA: Diagnosis not present

## 2015-11-09 DIAGNOSIS — J9601 Acute respiratory failure with hypoxia: Secondary | ICD-10-CM | POA: Diagnosis not present

## 2015-11-10 DIAGNOSIS — S42309A Unspecified fracture of shaft of humerus, unspecified arm, initial encounter for closed fracture: Secondary | ICD-10-CM

## 2015-11-10 DIAGNOSIS — S2249XA Multiple fractures of ribs, unspecified side, initial encounter for closed fracture: Secondary | ICD-10-CM

## 2015-11-10 DIAGNOSIS — R41841 Cognitive communication deficit: Secondary | ICD-10-CM | POA: Diagnosis not present

## 2015-11-10 DIAGNOSIS — J9601 Acute respiratory failure with hypoxia: Secondary | ICD-10-CM | POA: Diagnosis not present

## 2015-11-10 DIAGNOSIS — S42201S Unspecified fracture of upper end of right humerus, sequela: Secondary | ICD-10-CM | POA: Diagnosis not present

## 2015-11-10 DIAGNOSIS — M6281 Muscle weakness (generalized): Secondary | ICD-10-CM | POA: Diagnosis not present

## 2015-11-10 DIAGNOSIS — R279 Unspecified lack of coordination: Secondary | ICD-10-CM | POA: Diagnosis not present

## 2015-11-10 DIAGNOSIS — J9690 Respiratory failure, unspecified, unspecified whether with hypoxia or hypercapnia: Secondary | ICD-10-CM | POA: Diagnosis not present

## 2015-11-10 DIAGNOSIS — G7001 Myasthenia gravis with (acute) exacerbation: Secondary | ICD-10-CM | POA: Diagnosis not present

## 2015-11-10 DIAGNOSIS — R1312 Dysphagia, oropharyngeal phase: Secondary | ICD-10-CM | POA: Diagnosis not present

## 2015-11-10 DIAGNOSIS — S2241XA Multiple fractures of ribs, right side, initial encounter for closed fracture: Secondary | ICD-10-CM | POA: Diagnosis not present

## 2015-11-10 HISTORY — DX: Multiple fractures of ribs, unspecified side, initial encounter for closed fracture: S22.49XA

## 2015-11-10 HISTORY — DX: Unspecified fracture of shaft of humerus, unspecified arm, initial encounter for closed fracture: S42.309A

## 2015-11-11 DIAGNOSIS — J9601 Acute respiratory failure with hypoxia: Secondary | ICD-10-CM | POA: Diagnosis not present

## 2015-11-11 DIAGNOSIS — M6281 Muscle weakness (generalized): Secondary | ICD-10-CM | POA: Diagnosis not present

## 2015-11-11 DIAGNOSIS — R41841 Cognitive communication deficit: Secondary | ICD-10-CM | POA: Diagnosis not present

## 2015-11-11 DIAGNOSIS — J9690 Respiratory failure, unspecified, unspecified whether with hypoxia or hypercapnia: Secondary | ICD-10-CM | POA: Diagnosis not present

## 2015-11-11 DIAGNOSIS — R1312 Dysphagia, oropharyngeal phase: Secondary | ICD-10-CM | POA: Diagnosis not present

## 2015-11-11 DIAGNOSIS — R279 Unspecified lack of coordination: Secondary | ICD-10-CM | POA: Diagnosis not present

## 2015-11-14 DIAGNOSIS — D649 Anemia, unspecified: Secondary | ICD-10-CM | POA: Diagnosis not present

## 2015-11-16 DIAGNOSIS — J9690 Respiratory failure, unspecified, unspecified whether with hypoxia or hypercapnia: Secondary | ICD-10-CM | POA: Diagnosis not present

## 2015-11-16 DIAGNOSIS — R1312 Dysphagia, oropharyngeal phase: Secondary | ICD-10-CM | POA: Diagnosis not present

## 2015-11-16 DIAGNOSIS — R41841 Cognitive communication deficit: Secondary | ICD-10-CM | POA: Diagnosis not present

## 2015-11-16 DIAGNOSIS — R279 Unspecified lack of coordination: Secondary | ICD-10-CM | POA: Diagnosis not present

## 2015-11-16 DIAGNOSIS — J9601 Acute respiratory failure with hypoxia: Secondary | ICD-10-CM | POA: Diagnosis not present

## 2015-11-16 DIAGNOSIS — M6281 Muscle weakness (generalized): Secondary | ICD-10-CM | POA: Diagnosis not present

## 2015-11-17 DIAGNOSIS — R41841 Cognitive communication deficit: Secondary | ICD-10-CM | POA: Diagnosis not present

## 2015-11-17 DIAGNOSIS — M6281 Muscle weakness (generalized): Secondary | ICD-10-CM | POA: Diagnosis not present

## 2015-11-17 DIAGNOSIS — R279 Unspecified lack of coordination: Secondary | ICD-10-CM | POA: Diagnosis not present

## 2015-11-17 DIAGNOSIS — J9601 Acute respiratory failure with hypoxia: Secondary | ICD-10-CM | POA: Diagnosis not present

## 2015-11-17 DIAGNOSIS — J9690 Respiratory failure, unspecified, unspecified whether with hypoxia or hypercapnia: Secondary | ICD-10-CM | POA: Diagnosis not present

## 2015-11-17 DIAGNOSIS — R1312 Dysphagia, oropharyngeal phase: Secondary | ICD-10-CM | POA: Diagnosis not present

## 2015-11-19 DIAGNOSIS — R41841 Cognitive communication deficit: Secondary | ICD-10-CM | POA: Diagnosis not present

## 2015-11-19 DIAGNOSIS — R279 Unspecified lack of coordination: Secondary | ICD-10-CM | POA: Diagnosis not present

## 2015-11-19 DIAGNOSIS — M6281 Muscle weakness (generalized): Secondary | ICD-10-CM | POA: Diagnosis not present

## 2015-11-19 DIAGNOSIS — J9601 Acute respiratory failure with hypoxia: Secondary | ICD-10-CM | POA: Diagnosis not present

## 2015-11-19 DIAGNOSIS — R1312 Dysphagia, oropharyngeal phase: Secondary | ICD-10-CM | POA: Diagnosis not present

## 2015-11-19 DIAGNOSIS — J9690 Respiratory failure, unspecified, unspecified whether with hypoxia or hypercapnia: Secondary | ICD-10-CM | POA: Diagnosis not present

## 2015-11-21 DIAGNOSIS — J9601 Acute respiratory failure with hypoxia: Secondary | ICD-10-CM | POA: Diagnosis not present

## 2015-11-21 DIAGNOSIS — Z7901 Long term (current) use of anticoagulants: Secondary | ICD-10-CM | POA: Diagnosis not present

## 2015-11-21 DIAGNOSIS — J9690 Respiratory failure, unspecified, unspecified whether with hypoxia or hypercapnia: Secondary | ICD-10-CM | POA: Diagnosis not present

## 2015-11-21 DIAGNOSIS — R1312 Dysphagia, oropharyngeal phase: Secondary | ICD-10-CM | POA: Diagnosis not present

## 2015-11-21 DIAGNOSIS — Z961 Presence of intraocular lens: Secondary | ICD-10-CM | POA: Diagnosis not present

## 2015-11-21 DIAGNOSIS — R41841 Cognitive communication deficit: Secondary | ICD-10-CM | POA: Diagnosis not present

## 2015-11-21 DIAGNOSIS — R279 Unspecified lack of coordination: Secondary | ICD-10-CM | POA: Diagnosis not present

## 2015-11-21 DIAGNOSIS — Z7952 Long term (current) use of systemic steroids: Secondary | ICD-10-CM | POA: Diagnosis not present

## 2015-11-21 DIAGNOSIS — M6281 Muscle weakness (generalized): Secondary | ICD-10-CM | POA: Diagnosis not present

## 2015-11-22 DIAGNOSIS — M6281 Muscle weakness (generalized): Secondary | ICD-10-CM | POA: Diagnosis not present

## 2015-11-22 DIAGNOSIS — J9690 Respiratory failure, unspecified, unspecified whether with hypoxia or hypercapnia: Secondary | ICD-10-CM | POA: Diagnosis not present

## 2015-11-22 DIAGNOSIS — R41841 Cognitive communication deficit: Secondary | ICD-10-CM | POA: Diagnosis not present

## 2015-11-22 DIAGNOSIS — R279 Unspecified lack of coordination: Secondary | ICD-10-CM | POA: Diagnosis not present

## 2015-11-22 DIAGNOSIS — J9601 Acute respiratory failure with hypoxia: Secondary | ICD-10-CM | POA: Diagnosis not present

## 2015-11-22 DIAGNOSIS — R1312 Dysphagia, oropharyngeal phase: Secondary | ICD-10-CM | POA: Diagnosis not present

## 2015-11-23 DIAGNOSIS — J9601 Acute respiratory failure with hypoxia: Secondary | ICD-10-CM | POA: Diagnosis not present

## 2015-11-23 DIAGNOSIS — J9690 Respiratory failure, unspecified, unspecified whether with hypoxia or hypercapnia: Secondary | ICD-10-CM | POA: Diagnosis not present

## 2015-11-23 DIAGNOSIS — R41841 Cognitive communication deficit: Secondary | ICD-10-CM | POA: Diagnosis not present

## 2015-11-23 DIAGNOSIS — R279 Unspecified lack of coordination: Secondary | ICD-10-CM | POA: Diagnosis not present

## 2015-11-23 DIAGNOSIS — R1312 Dysphagia, oropharyngeal phase: Secondary | ICD-10-CM | POA: Diagnosis not present

## 2015-11-23 DIAGNOSIS — M6281 Muscle weakness (generalized): Secondary | ICD-10-CM | POA: Diagnosis not present

## 2015-11-24 DIAGNOSIS — M6281 Muscle weakness (generalized): Secondary | ICD-10-CM | POA: Diagnosis not present

## 2015-11-24 DIAGNOSIS — R1312 Dysphagia, oropharyngeal phase: Secondary | ICD-10-CM | POA: Diagnosis not present

## 2015-11-24 DIAGNOSIS — J9601 Acute respiratory failure with hypoxia: Secondary | ICD-10-CM | POA: Diagnosis not present

## 2015-11-24 DIAGNOSIS — J9690 Respiratory failure, unspecified, unspecified whether with hypoxia or hypercapnia: Secondary | ICD-10-CM | POA: Diagnosis not present

## 2015-11-24 DIAGNOSIS — R279 Unspecified lack of coordination: Secondary | ICD-10-CM | POA: Diagnosis not present

## 2015-11-24 DIAGNOSIS — R41841 Cognitive communication deficit: Secondary | ICD-10-CM | POA: Diagnosis not present

## 2015-11-30 ENCOUNTER — Emergency Department (HOSPITAL_COMMUNITY): Payer: Medicare Other

## 2015-11-30 ENCOUNTER — Encounter (HOSPITAL_COMMUNITY): Payer: Self-pay

## 2015-11-30 ENCOUNTER — Emergency Department (HOSPITAL_COMMUNITY)
Admission: EM | Admit: 2015-11-30 | Discharge: 2015-12-01 | Disposition: A | Discharge status: Home / Self Care | Payer: Medicare Other | Attending: Emergency Medicine | Admitting: Emergency Medicine

## 2015-11-30 DIAGNOSIS — Y929 Unspecified place or not applicable: Secondary | ICD-10-CM | POA: Diagnosis not present

## 2015-11-30 DIAGNOSIS — Y999 Unspecified external cause status: Secondary | ICD-10-CM | POA: Insufficient documentation

## 2015-11-30 DIAGNOSIS — W010XXA Fall on same level from slipping, tripping and stumbling without subsequent striking against object, initial encounter: Secondary | ICD-10-CM | POA: Insufficient documentation

## 2015-11-30 DIAGNOSIS — Z7901 Long term (current) use of anticoagulants: Secondary | ICD-10-CM | POA: Diagnosis not present

## 2015-11-30 DIAGNOSIS — S42201A Unspecified fracture of upper end of right humerus, initial encounter for closed fracture: Secondary | ICD-10-CM | POA: Insufficient documentation

## 2015-11-30 DIAGNOSIS — S4991XA Unspecified injury of right shoulder and upper arm, initial encounter: Secondary | ICD-10-CM | POA: Diagnosis present

## 2015-11-30 DIAGNOSIS — Y9301 Activity, walking, marching and hiking: Secondary | ICD-10-CM | POA: Insufficient documentation

## 2015-11-30 DIAGNOSIS — Z7982 Long term (current) use of aspirin: Secondary | ICD-10-CM | POA: Diagnosis not present

## 2015-11-30 DIAGNOSIS — S2241XA Multiple fractures of ribs, right side, initial encounter for closed fracture: Secondary | ICD-10-CM | POA: Diagnosis not present

## 2015-11-30 DIAGNOSIS — S3991XA Unspecified injury of abdomen, initial encounter: Secondary | ICD-10-CM | POA: Diagnosis not present

## 2015-11-30 DIAGNOSIS — K5730 Diverticulosis of large intestine without perforation or abscess without bleeding: Secondary | ICD-10-CM | POA: Diagnosis not present

## 2015-11-30 DIAGNOSIS — S199XXA Unspecified injury of neck, initial encounter: Secondary | ICD-10-CM | POA: Diagnosis not present

## 2015-11-30 DIAGNOSIS — W19XXXA Unspecified fall, initial encounter: Secondary | ICD-10-CM | POA: Diagnosis not present

## 2015-11-30 DIAGNOSIS — I10 Essential (primary) hypertension: Secondary | ICD-10-CM | POA: Insufficient documentation

## 2015-11-30 DIAGNOSIS — S0990XA Unspecified injury of head, initial encounter: Secondary | ICD-10-CM | POA: Insufficient documentation

## 2015-11-30 DIAGNOSIS — T148 Other injury of unspecified body region: Secondary | ICD-10-CM | POA: Diagnosis not present

## 2015-11-30 DIAGNOSIS — M25511 Pain in right shoulder: Secondary | ICD-10-CM | POA: Diagnosis not present

## 2015-11-30 DIAGNOSIS — Z79899 Other long term (current) drug therapy: Secondary | ICD-10-CM | POA: Insufficient documentation

## 2015-11-30 LAB — CBC WITH DIFFERENTIAL/PLATELET
Basophils Absolute: 0 10*3/uL (ref 0.0–0.1)
Basophils Relative: 0 %
EOS ABS: 0 10*3/uL (ref 0.0–0.7)
EOS PCT: 0 %
HCT: 44.6 % (ref 39.0–52.0)
Hemoglobin: 14.8 g/dL (ref 13.0–17.0)
LYMPHS ABS: 0.8 10*3/uL (ref 0.7–4.0)
Lymphocytes Relative: 10 %
MCH: 32 pg (ref 26.0–34.0)
MCHC: 33.2 g/dL (ref 30.0–36.0)
MCV: 96.3 fL (ref 78.0–100.0)
Monocytes Absolute: 0.5 10*3/uL (ref 0.1–1.0)
Monocytes Relative: 6 %
Neutro Abs: 6.6 10*3/uL (ref 1.7–7.7)
Neutrophils Relative %: 84 %
PLATELETS: 141 10*3/uL — AB (ref 150–400)
RBC: 4.63 MIL/uL (ref 4.22–5.81)
RDW: 14.8 % (ref 11.5–15.5)
WBC: 7.9 10*3/uL (ref 4.0–10.5)

## 2015-11-30 LAB — BASIC METABOLIC PANEL
Anion gap: 8 (ref 5–15)
BUN: 18 mg/dL (ref 6–20)
CALCIUM: 8.9 mg/dL (ref 8.9–10.3)
CHLORIDE: 104 mmol/L (ref 101–111)
CO2: 25 mmol/L (ref 22–32)
CREATININE: 0.75 mg/dL (ref 0.61–1.24)
GFR calc Af Amer: >60 mL/min (ref >60)
GFR calc non Af Amer: >60 mL/min (ref >60)
Glucose, Bld: 110 mg/dL — ABNORMAL HIGH (ref 65–99)
Potassium: 4 mmol/L (ref 3.5–5.1)
SODIUM: 137 mmol/L (ref 135–145)

## 2015-11-30 LAB — TYPE AND SCREEN
ABO/RH(D): A POS
ANTIBODY SCREEN: NEGATIVE

## 2015-11-30 LAB — PROTIME-INR
INR: 1.74 — ABNORMAL HIGH (ref 0.00–1.49)
PROTHROMBIN TIME: 20.3 s — AB (ref 11.6–15.2)

## 2015-11-30 LAB — ABO/RH: ABO/RH(D): A POS

## 2015-11-30 MED ORDER — SODIUM CHLORIDE 0.9 % IV SOLN
INTRAVENOUS | Status: DC
  Administered 2015-11-30: 22:00:00 via INTRAVENOUS

## 2015-11-30 MED ORDER — DOCUSATE SODIUM 100 MG PO CAPS: 100.0000 mg | ORAL_CAPSULE | Freq: Two times a day (BID) | ORAL | Status: AC

## 2015-11-30 MED ORDER — MORPHINE SULFATE (PF) 4 MG/ML IV SOLN
4.0000 mg | Freq: Once | INTRAVENOUS | Status: AC
  Administered 2015-11-30: 4 mg via INTRAVENOUS
  Filled 2015-11-30: qty 1

## 2015-11-30 MED ORDER — IOPAMIDOL (ISOVUE-300) INJECTION 61%
INTRAVENOUS | Status: AC
  Administered 2015-11-30: 22:00:00
  Filled 2015-11-30: qty 100

## 2015-11-30 MED ORDER — OXYCODONE-ACETAMINOPHEN 5-325 MG PO TABS: 2.0000 | ORAL_TABLET | ORAL | Status: DC | PRN

## 2015-11-30 NOTE — ED Provider Notes (Signed)
CSN: [id number]     Arrival date & time 11/30/15  1822 History   First MD Initiated Contact with Patient 11/30/15 1906     Chief Complaint  Patient presents with  . Arm Injury  . Fall     (Consider location/radiation/quality/duration/timing/severity/associated sxs/prior Treatment) HPI Patient states that he was walking into a room and the floor was slippery from mopping. He reports he fell and landed on his right arm and side. He reports he has pain in the right arm just below the level of the shoulder. He states he had x-rays done and the show a break in his arm. Patient denied he had other areas of pain. He denies he struck his head. He denies headache. He denies loss of consciousness. Patient denies shortness of breath. He denies abdominal pain. He denies lower extremity injury. Past Medical History  Diagnosis Date  . DVT (deep venous thrombosis) (HCC)   . Hypertension   . High cholesterol   . Myasthenia gravis (HCC)   . Dysphagia 06/30/12  . Anxiety    Past Surgical History  Procedure Laterality Date  . Esophagogastroduodenoscopy  07/11/2012    Procedure: ESOPHAGOGASTRODUODENOSCOPY (EGD);  Surgeon: Shirley Friar, MD;  Location: [facility];  Service: Endoscopy;  Laterality: N/A;  BEDSIDE  . Tracheostomy N/A   . Tee without cardioversion N/A 09/09/2012    Procedure: TRANSESOPHAGEAL ECHOCARDIOGRAM (TEE);  Surgeon: Dolores Patty, MD;  Location: [facility];  Service: Cardiovascular;  Laterality: N/A;   Family History  Problem Relation Age of Onset  . Myasthenia gravis Father    Social History  Substance Use Topics  . Smoking status: Never Smoker   . Smokeless tobacco: Never Used  . Alcohol Use: No     Comment: former quit 2014    Review of Systems  10 Systems reviewed and are negative for acute change except as noted in the HPI.   Allergies  Review of patient's allergies indicates no known allergies.  Home Medications   Prior to Admission medications    Medication Sig Start Date End Date Taking? Authorizing Provider  acetaminophen (TYLENOL) 325 MG tablet Take 650 mg by mouth 2 (two) times daily. Take 2 tablets twice daily at 6am and 9pm   Yes Historical Provider, MD  albuterol (PROVENTIL) (2.5 MG/3ML) 0.083% nebulizer solution Take 2.5 mg by nebulization every 8 (eight) hours as needed for wheezing or shortness of breath.   Yes Historical Provider, MD  aspirin 81 MG chewable tablet Give 81 mg by tube daily.   Yes Historical Provider, MD  atorvastatin (LIPITOR) 20 MG tablet Take 1 tablet (20 mg total) by mouth daily. 10/13/14  Yes Josalyn Funches, MD  azaTHIOprine (IMURAN) 50 MG tablet Take 2 tablets (100 mg total) by mouth daily. 11/25/12  Yes York Spaniel, MD  Fluconazole POWD 1 application by Does not apply route 2 (two) times daily. Under arms 10/12/14  Yes Josalyn Funches, MD  Multiple Vitamins-Minerals (CERTAGEN PO) Take by mouth daily.   Yes Historical Provider, MD  pantoprazole (PROTONIX) 40 MG tablet Take 40 mg by mouth daily.   Yes Historical Provider, MD  predniSONE (DELTASONE) 5 MG tablet Take 15 mg by mouth daily with breakfast.   Yes Historical Provider, MD  pyridostigmine (MESTINON) 60 MG tablet Take 90 mg by mouth 5 (five) times daily. Take 1 and 1/2 tabs 5 times daily.   Yes Historical Provider, MD  risperiDONE (RISPERDAL M-TABS) 1 MG disintegrating tablet Take 1 mg by mouth 2 (two)  times daily.   Yes Historical Provider, MD  risperiDONE microspheres (RISPERDAL CONSTA) 25 MG injection Inject 25 mg into the muscle every 14 (fourteen) days.   Yes Historical Provider, MD  terbinafine (LAMISIL AT) 1 % cream Apply 1 application topically 2 (two) times daily. 10/15/14  Yes Dessa Phi, MD  traZODone (DESYREL) 100 MG tablet Take 100 mg by mouth at bedtime.   Yes Historical Provider, MD  warfarin (COUMADIN) 1 MG tablet Take 4.5 mg by mouth daily.   Yes Historical Provider, MD  docusate sodium (COLACE) 100 MG capsule Take 1 capsule (100 mg  total) by mouth every 12 (twelve) hours. 11/30/15   Arby Barrette, MD  oxyCODONE-acetaminophen (PERCOCET/ROXICET) 5-325 MG tablet Take 2 tablets by mouth every 4 (four) hours as needed for severe pain. 11/30/15   Arby Barrette, MD   BP 123/74 mmHg  Pulse 69  Temp(Src) 98 F (36.7 C) (Oral)  Resp 18  Ht 6\' 1"  (1.854 m)  Wt 192 lb (87.091 kg)  BMI 25.34 kg/m2  SpO2 95% Physical Exam  Constitutional: He is oriented to person, place, and time. He appears well-developed and well-nourished. No distress.  Patient is alert and in no acute distress. He is answering questions without difficulty.  HENT:  Head: Normocephalic and atraumatic.  Mouth/Throat: Oropharynx is clear and moist.  Eyes: EOM are normal. Pupils are equal, round, and reactive to light.  Neck: Neck supple.  Cardiovascular: Normal rate, regular rhythm, normal heart sounds and intact distal pulses.   Pulmonary/Chest: Effort normal and breath sounds normal. He exhibits tenderness.  Patient does endorse tenderness to palpation on the right lateral chest wall. No crepitus or obvious abrasion or contusion at this time.  Abdominal: Soft. Bowel sounds are normal. He exhibits no distension. There is no tenderness.  No hepatosplenomegaly tenderness or enlargement.  Musculoskeletal: He exhibits no edema.  Patient has known humerus fracture on the right. At this time, there is no large swelling or discoloration. The shoulder is in alignment. No effusion at the elbow. Hand is warm and dry with normal pulse. No lower extremity deformity. No peripheral edema. Patient can move both legs without difficulty.  Neurological: He is alert and oriented to person, place, and time. He has normal strength. No cranial nerve deficit. He exhibits normal muscle tone. Coordination normal. GCS eye subscore is 4. GCS verbal subscore is 5. GCS motor subscore is 6.  Skin: Skin is warm, dry and intact.  Psychiatric: He has a normal mood and affect.    ED Course   Procedures (including critical care time) Labs Review Labs Reviewed  BASIC METABOLIC PANEL - Abnormal; Notable for the following:    Glucose, Bld 110 (*)    All other components within normal limits  CBC WITH DIFFERENTIAL/PLATELET - Abnormal; Notable for the following:    Platelets 141 (*)    All other components within normal limits  PROTIME-INR - Abnormal; Notable for the following:    Prothrombin Time 20.3 (*)    INR 1.74 (*)    All other components within normal limits  TYPE AND SCREEN  ABO/RH    Imaging Review Dg Ribs Unilateral W/chest Right  11/30/2015  CLINICAL DATA:  Status post fall, landing on right side, with right rib tenderness. Initial encounter.a EXAM: RIGHT RIBS AND CHEST - 3+ VIEW COMPARISON:  Chest radiograph performed 09/12/2012 FINDINGS: There appears to be minimally displaced fractures through the right fourth through seventh posterolateral ribs. Associated pleural thickening likely reflects minimal underlying hemorrhage. The  lungs are hypoexpanded. Vascular crowding and vascular congestion are noted. Bibasilar atelectasis is noted. The cardiomediastinal silhouette is mildly enlarged. IMPRESSION: 1. Minimally displaced fractures through the right posterolateral fourth through seventh ribs. Associated pleural thickening likely reflects minimal underlying hemorrhage. 2. Lungs hypoexpanded, with bibasilar atelectasis. 3. Vascular congestion and mild cardiomegaly. Electronically Signed   By: Roanna Raider M.D.   On: 11/30/2015 21:02   Ct Head Wo Contrast  11/30/2015  CLINICAL DATA:  Status post fall. Hit right side of head and neck. Initial encounter. EXAM: CT HEAD WITHOUT CONTRAST CT CERVICAL SPINE WITHOUT CONTRAST TECHNIQUE: Multidetector CT imaging of the head and cervical spine was performed following the standard protocol without intravenous contrast. Multiplanar CT image reconstructions of the cervical spine were also generated. COMPARISON:  CT of the head performed  09/03/2012, CT of the neck performed 07/10/2012, MRI of the head performed 07/11/2012, and MRI of the cervical spine performed 07/15/2012 FINDINGS: CT HEAD FINDINGS There is no evidence of acute infarction, mass lesion, or intra- or extra-axial hemorrhage on CT. Mild periventricular white matter change may reflect small vessel ischemic microangiopathy. The posterior fossa, including the cerebellum, brainstem and fourth ventricle, is within normal limits. The third and lateral ventricles, and basal ganglia are unremarkable in appearance. The cerebral hemispheres are symmetric in appearance, with normal gray-white differentiation. No mass effect or midline shift is seen. There is no evidence of fracture; an apparent periapical abscess is noted at the right central maxilla, with cortical breakthrough. The orbits are within normal limits. There is mild partial opacification of the left mastoid air cells. The paranasal sinuses and right mastoid air cells are well-aerated. No significant soft tissue abnormalities are seen. CT CERVICAL SPINE FINDINGS There is no evidence of acute fracture or subluxation. Vertebral bodies demonstrate normal alignment. There appears to be mild chronic loss of height at the superior endplate of T3. Intervertebral disc spaces are preserved. Prevertebral soft tissues are within normal limits. The visualized neural foramina are grossly unremarkable. The thyroid gland is unremarkable in appearance. The visualized lung apices are clear. No significant soft tissue abnormalities are seen. IMPRESSION: 1. No evidence of traumatic intracranial injury or fracture. 2. No evidence of acute fracture or subluxation along the cervical spine. 3. Mild small vessel ischemic microangiopathy. 4. Apparent periapical abscess at the right central maxilla, with cortical breakthrough. 5. Mild partial opacification of the left mastoid air cells. 6. Mild chronic loss of height at the superior endplate of T3.  Electronically Signed   By: Roanna Raider M.D.   On: 11/30/2015 23:08   Ct Chest W Contrast  11/30/2015  CLINICAL DATA:  60 year old male with fall and trauma to the right side of the body. EXAM: CT CHEST, ABDOMEN, AND PELVIS WITH CONTRAST TECHNIQUE: Multidetector CT imaging of the chest, abdomen and pelvis was performed following the standard protocol during bolus administration of intravenous contrast. CONTRAST:  1 ISOVUE-300 IOPAMIDOL (ISOVUE-300) INJECTION 61% COMPARISON:  None. FINDINGS: Evaluation is limited due to streak artifact caused by patient's arms. CT CHEST There are linear and subsegmental bibasilar atelectasis/ scarring. The lungs are otherwise clear. No pleural effusion or pneumothorax. The central airways are patent. The thoracic aorta appears unremarkable. The origins of the great vessels of the aortic arch appear patent. The central pulmonary arteries appear unremarkable. There is no cardiomegaly or pericardial effusion. There is coronary vascular calcification. No hilar or mediastinal adenopathy. The esophagus is grossly unremarkable. No thyroid nodules identified. There is no axillary adenopathy. The chest wall soft  tissues appear unremarkable. There is nondisplaced transverse fracture of the right humeral neck with extension of the fracture into the lateral aspect of the right humeral head. There are nondisplaced fractures of the right 5th-10th ribs. The bones are osteopenic. There is slight angulation and dilatation of the inferior aspect of the stent non which may be chronic or represent a nondisplaced fracture. Clinical correlation is recommended. There are multilevel compression deformity of the thoracic spine most prominent at T6 where there is approximately greater than 50% loss of vertebral body height. Multilevel thoracic spine anterior wedging noted. These fractures are age indeterminate, however acute fracture is not excluded. Evaluation is very limited due to osteopenia.  Clinical correlation is recommended. CT ABDOMEN AND PELVIS No intra-abdominal free air or free fluid. There are 2 hypodense/hypo enhancing lesions in the left lobe of the liver measuring up to 2.5 x 5.3 cm. The larger lesion likely represents a cyst. There is a 1.7 x 1.6 cm lesion in the medial segment of the left lobe of the liver which is indeterminate but may represent a hemangioma or a cyst. The gallbladder, pancreas, spleen, adrenal glands, appear unremarkable. Two Small nonobstructing left renal upper pole calculi measuring up to 3 mm. Bilateral renal hypodense lesions are not characterized but likely represent cysts. There is no hydronephrosis on either side. The visualized ureters and urinary bladder appear unremarkable. The prostate gland is heterogeneous. There is median lobe hypertrophy with indentation of the bladder base. There is moderate stool throughout the colon. There is sigmoid diverticulosis without active inflammatory changes. There is no evidence of bowel obstruction. Normal appendix. Mild aortoiliac atherosclerotic disease. The abdominal aorta is otherwise unremarkable. The IVC appears patent. No portal venous gas identified. There is no adenopathy. There is a small fat containing umbilical hernia with mild haziness of the herniated fat. Correlation with clinical exam is recommended to evaluate for inflammation or incarceration. No fluid collection. Small fat containing bilateral inguinal hernias noted. The abdominal wall soft tissues are otherwise unremarkable. There is advanced osteopenia with multilevel compression deformity of the lumbar spine. These fractures are age indeterminate. Evaluation of the osseous structures for fracture is limited due to osteopenia. No retropulsed fragment or evidence of subluxation. IMPRESSION: Nondisplaced fracture of the right humeral neck extending into the greater tubercle. Multiple right rib fractures.  No pneumothorax. Advanced osteopenia and extensive  multilevel thoracic and lumbar spine compression deformity, age indeterminate. Clinical correlation is recommended. There is no retropulsed fragment or subluxation. No acute/traumatic pathology involving intra-abdominal or pelvic solid organ or viscera. Electronically Signed   By: Elgie CollardArash  Radparvar M.D.   On: 11/30/2015 23:15   Ct Cervical Spine Wo Contrast  11/30/2015  CLINICAL DATA:  Status post fall. Hit right side of head and neck. Initial encounter. EXAM: CT HEAD WITHOUT CONTRAST CT CERVICAL SPINE WITHOUT CONTRAST TECHNIQUE: Multidetector CT imaging of the head and cervical spine was performed following the standard protocol without intravenous contrast. Multiplanar CT image reconstructions of the cervical spine were also generated. COMPARISON:  CT of the head performed 09/03/2012, CT of the neck performed 07/10/2012, MRI of the head performed 07/11/2012, and MRI of the cervical spine performed 07/15/2012 FINDINGS: CT HEAD FINDINGS There is no evidence of acute infarction, mass lesion, or intra- or extra-axial hemorrhage on CT. Mild periventricular white matter change may reflect small vessel ischemic microangiopathy. The posterior fossa, including the cerebellum, brainstem and fourth ventricle, is within normal limits. The third and lateral ventricles, and basal ganglia are unremarkable in appearance.  The cerebral hemispheres are symmetric in appearance, with normal gray-white differentiation. No mass effect or midline shift is seen. There is no evidence of fracture; an apparent periapical abscess is noted at the right central maxilla, with cortical breakthrough. The orbits are within normal limits. There is mild partial opacification of the left mastoid air cells. The paranasal sinuses and right mastoid air cells are well-aerated. No significant soft tissue abnormalities are seen. CT CERVICAL SPINE FINDINGS There is no evidence of acute fracture or subluxation. Vertebral bodies demonstrate normal alignment.  There appears to be mild chronic loss of height at the superior endplate of T3. Intervertebral disc spaces are preserved. Prevertebral soft tissues are within normal limits. The visualized neural foramina are grossly unremarkable. The thyroid gland is unremarkable in appearance. The visualized lung apices are clear. No significant soft tissue abnormalities are seen. IMPRESSION: 1. No evidence of traumatic intracranial injury or fracture. 2. No evidence of acute fracture or subluxation along the cervical spine. 3. Mild small vessel ischemic microangiopathy. 4. Apparent periapical abscess at the right central maxilla, with cortical breakthrough. 5. Mild partial opacification of the left mastoid air cells. 6. Mild chronic loss of height at the superior endplate of T3. Electronically Signed   By: Roanna Raider M.D.   On: 11/30/2015 23:08   Ct Abdomen Pelvis W Contrast  11/30/2015  CLINICAL DATA:  60 year old male with fall and trauma to the right side of the body. EXAM: CT CHEST, ABDOMEN, AND PELVIS WITH CONTRAST TECHNIQUE: Multidetector CT imaging of the chest, abdomen and pelvis was performed following the standard protocol during bolus administration of intravenous contrast. CONTRAST:  1 ISOVUE-300 IOPAMIDOL (ISOVUE-300) INJECTION 61% COMPARISON:  None. FINDINGS: Evaluation is limited due to streak artifact caused by patient's arms. CT CHEST There are linear and subsegmental bibasilar atelectasis/ scarring. The lungs are otherwise clear. No pleural effusion or pneumothorax. The central airways are patent. The thoracic aorta appears unremarkable. The origins of the great vessels of the aortic arch appear patent. The central pulmonary arteries appear unremarkable. There is no cardiomegaly or pericardial effusion. There is coronary vascular calcification. No hilar or mediastinal adenopathy. The esophagus is grossly unremarkable. No thyroid nodules identified. There is no axillary adenopathy. The chest wall soft  tissues appear unremarkable. There is nondisplaced transverse fracture of the right humeral neck with extension of the fracture into the lateral aspect of the right humeral head. There are nondisplaced fractures of the right 5th-10th ribs. The bones are osteopenic. There is slight angulation and dilatation of the inferior aspect of the stent non which may be chronic or represent a nondisplaced fracture. Clinical correlation is recommended. There are multilevel compression deformity of the thoracic spine most prominent at T6 where there is approximately greater than 50% loss of vertebral body height. Multilevel thoracic spine anterior wedging noted. These fractures are age indeterminate, however acute fracture is not excluded. Evaluation is very limited due to osteopenia. Clinical correlation is recommended. CT ABDOMEN AND PELVIS No intra-abdominal free air or free fluid. There are 2 hypodense/hypo enhancing lesions in the left lobe of the liver measuring up to 2.5 x 5.3 cm. The larger lesion likely represents a cyst. There is a 1.7 x 1.6 cm lesion in the medial segment of the left lobe of the liver which is indeterminate but may represent a hemangioma or a cyst. The gallbladder, pancreas, spleen, adrenal glands, appear unremarkable. Two Small nonobstructing left renal upper pole calculi measuring up to 3 mm. Bilateral renal hypodense lesions are not  characterized but likely represent cysts. There is no hydronephrosis on either side. The visualized ureters and urinary bladder appear unremarkable. The prostate gland is heterogeneous. There is median lobe hypertrophy with indentation of the bladder base. There is moderate stool throughout the colon. There is sigmoid diverticulosis without active inflammatory changes. There is no evidence of bowel obstruction. Normal appendix. Mild aortoiliac atherosclerotic disease. The abdominal aorta is otherwise unremarkable. The IVC appears patent. No portal venous gas identified.  There is no adenopathy. There is a small fat containing umbilical hernia with mild haziness of the herniated fat. Correlation with clinical exam is recommended to evaluate for inflammation or incarceration. No fluid collection. Small fat containing bilateral inguinal hernias noted. The abdominal wall soft tissues are otherwise unremarkable. There is advanced osteopenia with multilevel compression deformity of the lumbar spine. These fractures are age indeterminate. Evaluation of the osseous structures for fracture is limited due to osteopenia. No retropulsed fragment or evidence of subluxation. IMPRESSION: Nondisplaced fracture of the right humeral neck extending into the greater tubercle. Multiple right rib fractures.  No pneumothorax. Advanced osteopenia and extensive multilevel thoracic and lumbar spine compression deformity, age indeterminate. Clinical correlation is recommended. There is no retropulsed fragment or subluxation. No acute/traumatic pathology involving intra-abdominal or pelvic solid organ or viscera. Electronically Signed   By: Elgie Collard M.D.   On: 11/30/2015 23:15   I have personally reviewed and evaluated these images and lab results as part of my medical decision-making.   EKG Interpretation None      MDM   Final diagnoses:  Fracture of proximal end of right humerus, closed, initial encounter  Fracture of ribs, multiple, right, closed, initial encounter   Patient fell from standing height resulting in a right humerus fracture. Patient did not have pain until palpated on the chest wall. Imaging reveals 4 rib fractures that are nondisplaced. Patient is not complaining of pain in this area. He does not have dyspnea. CT does not show hemopneumothorax. Due to the patient being on Coumadin as risk associated injury, CT of the head and cervical spine obtained as well. No acute anomalies identified. At this time, I do feel patient is safe to return for ongoing nursing home care.  Plan will be for pain control with Percocet. Sling for proximal humerus fracture.    Arby Barrette, MD 12/05/15 [phone]

## 2015-11-30 NOTE — Discharge Instructions (Signed)
Humerus Fracture Treated With Immobilization The humerus is the large bone in your upper arm. You have a broken (fractured) humerus. These fractures are easily diagnosed with X-rays. TREATMENT  Simple fractures which will heal without disability are treated with simple immobilization. Immobilization means you will wear a cast, splint, or sling. You have a fracture which will do well with immobilization. The fracture will heal well simply by being held in a good position until it is stable enough to begin range of motion exercises. Do not take part in activities which would further injure your arm.  HOME CARE INSTRUCTIONS   Put ice on the injured area.  Put ice in a plastic bag.  Place a towel between your skin and the bag.  Leave the ice on for 15-20 minutes, 03-04 times a day.  If you have a cast:  Do not scratch the skin under the cast using sharp or pointed objects.  Check the skin around the cast every day. You may put lotion on any red or sore areas.  Keep your cast dry and clean.  If you have a splint:  Wear the splint as directed.  Keep your splint dry and clean.  You may loosen the elastic around the splint if your fingers become numb, tingle, or turn cold or blue.  If you have a sling:  Wear the sling as directed.  Do not put pressure on any part of your cast or splint until it is fully hardened.  Your cast or splint can be protected during bathing with a plastic bag. Do not lower the cast or splint into water.  Only take over-the-counter or prescription medicines for pain, discomfort, or fever as directed by your caregiver.  Do range of motion exercises as instructed by your caregiver.  Follow up as directed by your caregiver. This is very important in order to avoid permanent injury or disability and chronic pain. SEEK IMMEDIATE MEDICAL CARE IF:   Your skin or nails in the injured arm turn blue or gray.  Your arm feels cold or numb.  You develop severe pain  in the injured arm.  You are having problems with the medicines you were given. MAKE SURE YOU:   Understand these instructions.  Will watch your condition.  Will get help right away if you are not doing well or get worse.   This information is not intended to replace advice given to you by your health care provider. Make sure you discuss any questions you have with your health care provider.   Document Released: 09/03/2000 Document Revised: 06/18/2014 Document Reviewed: 10/20/2014 Elsevier Interactive Patient Education 2016 [employer] Rib Fracture A rib fracture is a break or crack in one of the bones of the ribs. The ribs are a group of long, curved bones that wrap around your chest and attach to your spine. They protect your lungs and other organs in the chest cavity. A broken or cracked rib is often painful, but most do not cause other problems. Most rib fractures heal on their own over time. However, rib fractures can be more serious if multiple ribs are broken or if broken ribs move out of place and push against other structures. CAUSES   A direct blow to the chest. For example, this could happen during contact sports, a car accident, or a fall against a hard object.  Repetitive movements with high force, such as pitching a baseball or having severe coughing spells. SYMPTOMS   Pain when you breathe in  or cough.  Pain when someone presses on the injured area. DIAGNOSIS  Your caregiver will perform a physical exam. Various imaging tests may be ordered to confirm the diagnosis and to look for related injuries. These tests may include a chest X-ray, computed tomography (CT), magnetic resonance imaging (MRI), or a bone scan. TREATMENT  Rib fractures usually heal on their own in 1-3 months. The longer healing period is often associated with a continued cough or other aggravating activities. During the healing period, pain control is very important. Medication is usually given to  control pain. Hospitalization or surgery may be needed for more severe injuries, such as those in which multiple ribs are broken or the ribs have moved out of place.  HOME CARE INSTRUCTIONS   Avoid strenuous activity and any activities or movements that cause pain. Be careful during activities and avoid bumping the injured rib.  Gradually increase activity as directed by your caregiver.  Only take over-the-counter or prescription medications as directed by your caregiver. Do not take other medications without asking your caregiver first.  Apply ice to the injured area for the first 1-2 days after you have been treated or as directed by your caregiver. Applying ice helps to reduce inflammation and pain.  Put ice in a plastic bag.  Place a towel between your skin and the bag.   Leave the ice on for 15-20 minutes at a time, every 2 hours while you are awake.  Perform deep breathing as directed by your caregiver. This will help prevent pneumonia, which is a common complication of a broken rib. Your caregiver may instruct you to:  Take deep breaths several times a day.  Try to cough several times a day, holding a pillow against the injured area.  Use a device called an incentive spirometer to practice deep breathing several times a day.  Drink enough fluids to keep your urine clear or pale yellow. This will help you avoid constipation.   Do not wear a rib belt or binder. These restrict breathing, which can lead to pneumonia.  SEEK IMMEDIATE MEDICAL CARE IF:   You have a fever.   You have difficulty breathing or shortness of breath.   You develop a continual cough, or you cough up thick or bloody sputum.  You feel sick to your stomach (nausea), throw up (vomit), or have abdominal pain.   You have worsening pain not controlled with medications.  MAKE SURE YOU:  Understand these instructions.  Will watch your condition.  Will get help right away if you are not doing well  or get worse.   This information is not intended to replace advice given to you by your health care provider. Make sure you discuss any questions you have with your health care provider.   Document Released: 05/28/2005 Document Revised: 01/28/2013 Document Reviewed: 07/30/2012 Elsevier Interactive Patient Education [employer]

## 2015-11-30 NOTE — ED Notes (Signed)
[facility] for [facility] at 1000 this AM.  Pt had Xrays completed at facility and has confirmed right humeral head fx.  Pt is on Coumadin and has scattered bruising to left knee as well.

## 2015-12-01 ENCOUNTER — Ambulatory Visit: Payer: Medicaid Other | Admitting: Neurology

## 2015-12-01 DIAGNOSIS — G7000 Myasthenia gravis without (acute) exacerbation: Secondary | ICD-10-CM | POA: Diagnosis not present

## 2015-12-01 DIAGNOSIS — S5290XA Unspecified fracture of unspecified forearm, initial encounter for closed fracture: Secondary | ICD-10-CM | POA: Diagnosis not present

## 2015-12-01 DIAGNOSIS — J9690 Respiratory failure, unspecified, unspecified whether with hypoxia or hypercapnia: Secondary | ICD-10-CM | POA: Diagnosis not present

## 2015-12-01 DIAGNOSIS — J9601 Acute respiratory failure with hypoxia: Secondary | ICD-10-CM | POA: Diagnosis not present

## 2015-12-01 DIAGNOSIS — I82409 Acute embolism and thrombosis of unspecified deep veins of unspecified lower extremity: Secondary | ICD-10-CM | POA: Diagnosis not present

## 2015-12-01 DIAGNOSIS — M6281 Muscle weakness (generalized): Secondary | ICD-10-CM | POA: Diagnosis not present

## 2015-12-01 DIAGNOSIS — S42301A Unspecified fracture of shaft of humerus, right arm, initial encounter for closed fracture: Secondary | ICD-10-CM | POA: Diagnosis not present

## 2015-12-01 DIAGNOSIS — K219 Gastro-esophageal reflux disease without esophagitis: Secondary | ICD-10-CM | POA: Diagnosis not present

## 2015-12-01 DIAGNOSIS — F0391 Unspecified dementia with behavioral disturbance: Secondary | ICD-10-CM | POA: Diagnosis not present

## 2015-12-01 DIAGNOSIS — R1312 Dysphagia, oropharyngeal phase: Secondary | ICD-10-CM | POA: Diagnosis not present

## 2015-12-01 DIAGNOSIS — F329 Major depressive disorder, single episode, unspecified: Secondary | ICD-10-CM | POA: Diagnosis not present

## 2015-12-01 DIAGNOSIS — R279 Unspecified lack of coordination: Secondary | ICD-10-CM | POA: Diagnosis not present

## 2015-12-01 DIAGNOSIS — R41841 Cognitive communication deficit: Secondary | ICD-10-CM | POA: Diagnosis not present

## 2015-12-02 ENCOUNTER — Encounter: Payer: Self-pay | Admitting: Neurology

## 2015-12-02 DIAGNOSIS — R1312 Dysphagia, oropharyngeal phase: Secondary | ICD-10-CM | POA: Diagnosis not present

## 2015-12-02 DIAGNOSIS — J9690 Respiratory failure, unspecified, unspecified whether with hypoxia or hypercapnia: Secondary | ICD-10-CM | POA: Diagnosis not present

## 2015-12-02 DIAGNOSIS — R41841 Cognitive communication deficit: Secondary | ICD-10-CM | POA: Diagnosis not present

## 2015-12-02 DIAGNOSIS — M6281 Muscle weakness (generalized): Secondary | ICD-10-CM | POA: Diagnosis not present

## 2015-12-02 DIAGNOSIS — J9601 Acute respiratory failure with hypoxia: Secondary | ICD-10-CM | POA: Diagnosis not present

## 2015-12-02 DIAGNOSIS — R279 Unspecified lack of coordination: Secondary | ICD-10-CM | POA: Diagnosis not present

## 2015-12-05 DIAGNOSIS — R279 Unspecified lack of coordination: Secondary | ICD-10-CM | POA: Diagnosis not present

## 2015-12-05 DIAGNOSIS — M6281 Muscle weakness (generalized): Secondary | ICD-10-CM | POA: Diagnosis not present

## 2015-12-05 DIAGNOSIS — R41841 Cognitive communication deficit: Secondary | ICD-10-CM | POA: Diagnosis not present

## 2015-12-05 DIAGNOSIS — R1312 Dysphagia, oropharyngeal phase: Secondary | ICD-10-CM | POA: Diagnosis not present

## 2015-12-05 DIAGNOSIS — J9601 Acute respiratory failure with hypoxia: Secondary | ICD-10-CM | POA: Diagnosis not present

## 2015-12-05 DIAGNOSIS — J9690 Respiratory failure, unspecified, unspecified whether with hypoxia or hypercapnia: Secondary | ICD-10-CM | POA: Diagnosis not present

## 2015-12-06 DIAGNOSIS — J9690 Respiratory failure, unspecified, unspecified whether with hypoxia or hypercapnia: Secondary | ICD-10-CM | POA: Diagnosis not present

## 2015-12-06 DIAGNOSIS — J9601 Acute respiratory failure with hypoxia: Secondary | ICD-10-CM | POA: Diagnosis not present

## 2015-12-06 DIAGNOSIS — R1312 Dysphagia, oropharyngeal phase: Secondary | ICD-10-CM | POA: Diagnosis not present

## 2015-12-06 DIAGNOSIS — R279 Unspecified lack of coordination: Secondary | ICD-10-CM | POA: Diagnosis not present

## 2015-12-06 DIAGNOSIS — R41841 Cognitive communication deficit: Secondary | ICD-10-CM | POA: Diagnosis not present

## 2015-12-06 DIAGNOSIS — M6281 Muscle weakness (generalized): Secondary | ICD-10-CM | POA: Diagnosis not present

## 2015-12-07 DIAGNOSIS — J9601 Acute respiratory failure with hypoxia: Secondary | ICD-10-CM | POA: Diagnosis not present

## 2015-12-07 DIAGNOSIS — R1312 Dysphagia, oropharyngeal phase: Secondary | ICD-10-CM | POA: Diagnosis not present

## 2015-12-07 DIAGNOSIS — R41841 Cognitive communication deficit: Secondary | ICD-10-CM | POA: Diagnosis not present

## 2015-12-07 DIAGNOSIS — M6281 Muscle weakness (generalized): Secondary | ICD-10-CM | POA: Diagnosis not present

## 2015-12-07 DIAGNOSIS — R279 Unspecified lack of coordination: Secondary | ICD-10-CM | POA: Diagnosis not present

## 2015-12-07 DIAGNOSIS — J9690 Respiratory failure, unspecified, unspecified whether with hypoxia or hypercapnia: Secondary | ICD-10-CM | POA: Diagnosis not present

## 2015-12-08 DIAGNOSIS — R1312 Dysphagia, oropharyngeal phase: Secondary | ICD-10-CM | POA: Diagnosis not present

## 2015-12-08 DIAGNOSIS — J9601 Acute respiratory failure with hypoxia: Secondary | ICD-10-CM | POA: Diagnosis not present

## 2015-12-08 DIAGNOSIS — M6281 Muscle weakness (generalized): Secondary | ICD-10-CM | POA: Diagnosis not present

## 2015-12-08 DIAGNOSIS — R41841 Cognitive communication deficit: Secondary | ICD-10-CM | POA: Diagnosis not present

## 2015-12-08 DIAGNOSIS — R279 Unspecified lack of coordination: Secondary | ICD-10-CM | POA: Diagnosis not present

## 2015-12-08 DIAGNOSIS — J9690 Respiratory failure, unspecified, unspecified whether with hypoxia or hypercapnia: Secondary | ICD-10-CM | POA: Diagnosis not present

## 2015-12-09 DIAGNOSIS — R1312 Dysphagia, oropharyngeal phase: Secondary | ICD-10-CM | POA: Diagnosis not present

## 2015-12-09 DIAGNOSIS — R279 Unspecified lack of coordination: Secondary | ICD-10-CM | POA: Diagnosis not present

## 2015-12-09 DIAGNOSIS — J9690 Respiratory failure, unspecified, unspecified whether with hypoxia or hypercapnia: Secondary | ICD-10-CM | POA: Diagnosis not present

## 2015-12-09 DIAGNOSIS — M6281 Muscle weakness (generalized): Secondary | ICD-10-CM | POA: Diagnosis not present

## 2015-12-09 DIAGNOSIS — J9601 Acute respiratory failure with hypoxia: Secondary | ICD-10-CM | POA: Diagnosis not present

## 2015-12-09 DIAGNOSIS — R41841 Cognitive communication deficit: Secondary | ICD-10-CM | POA: Diagnosis not present

## 2015-12-12 DIAGNOSIS — R1312 Dysphagia, oropharyngeal phase: Secondary | ICD-10-CM | POA: Diagnosis not present

## 2015-12-12 DIAGNOSIS — R41841 Cognitive communication deficit: Secondary | ICD-10-CM | POA: Diagnosis not present

## 2015-12-12 DIAGNOSIS — S2241XA Multiple fractures of ribs, right side, initial encounter for closed fracture: Secondary | ICD-10-CM | POA: Diagnosis not present

## 2015-12-12 DIAGNOSIS — J9601 Acute respiratory failure with hypoxia: Secondary | ICD-10-CM | POA: Diagnosis not present

## 2015-12-12 DIAGNOSIS — S42201S Unspecified fracture of upper end of right humerus, sequela: Secondary | ICD-10-CM | POA: Diagnosis not present

## 2015-12-12 DIAGNOSIS — J9690 Respiratory failure, unspecified, unspecified whether with hypoxia or hypercapnia: Secondary | ICD-10-CM | POA: Diagnosis not present

## 2015-12-12 DIAGNOSIS — S42224A 2-part nondisplaced fracture of surgical neck of right humerus, initial encounter for closed fracture: Secondary | ICD-10-CM | POA: Diagnosis not present

## 2015-12-12 DIAGNOSIS — G7001 Myasthenia gravis with (acute) exacerbation: Secondary | ICD-10-CM | POA: Diagnosis not present

## 2015-12-12 DIAGNOSIS — M6281 Muscle weakness (generalized): Secondary | ICD-10-CM | POA: Diagnosis not present

## 2015-12-12 DIAGNOSIS — R279 Unspecified lack of coordination: Secondary | ICD-10-CM | POA: Diagnosis not present

## 2015-12-13 DIAGNOSIS — M6281 Muscle weakness (generalized): Secondary | ICD-10-CM | POA: Diagnosis not present

## 2015-12-13 DIAGNOSIS — R41841 Cognitive communication deficit: Secondary | ICD-10-CM | POA: Diagnosis not present

## 2015-12-13 DIAGNOSIS — R279 Unspecified lack of coordination: Secondary | ICD-10-CM | POA: Diagnosis not present

## 2015-12-13 DIAGNOSIS — J9601 Acute respiratory failure with hypoxia: Secondary | ICD-10-CM | POA: Diagnosis not present

## 2015-12-13 DIAGNOSIS — R1312 Dysphagia, oropharyngeal phase: Secondary | ICD-10-CM | POA: Diagnosis not present

## 2015-12-13 DIAGNOSIS — J9690 Respiratory failure, unspecified, unspecified whether with hypoxia or hypercapnia: Secondary | ICD-10-CM | POA: Diagnosis not present

## 2015-12-14 DIAGNOSIS — R1312 Dysphagia, oropharyngeal phase: Secondary | ICD-10-CM | POA: Diagnosis not present

## 2015-12-14 DIAGNOSIS — M6281 Muscle weakness (generalized): Secondary | ICD-10-CM | POA: Diagnosis not present

## 2015-12-14 DIAGNOSIS — J9601 Acute respiratory failure with hypoxia: Secondary | ICD-10-CM | POA: Diagnosis not present

## 2015-12-14 DIAGNOSIS — R279 Unspecified lack of coordination: Secondary | ICD-10-CM | POA: Diagnosis not present

## 2015-12-14 DIAGNOSIS — J9690 Respiratory failure, unspecified, unspecified whether with hypoxia or hypercapnia: Secondary | ICD-10-CM | POA: Diagnosis not present

## 2015-12-14 DIAGNOSIS — D649 Anemia, unspecified: Secondary | ICD-10-CM | POA: Diagnosis not present

## 2015-12-14 DIAGNOSIS — R41841 Cognitive communication deficit: Secondary | ICD-10-CM | POA: Diagnosis not present

## 2015-12-15 DIAGNOSIS — J9601 Acute respiratory failure with hypoxia: Secondary | ICD-10-CM | POA: Diagnosis not present

## 2015-12-15 DIAGNOSIS — M6281 Muscle weakness (generalized): Secondary | ICD-10-CM | POA: Diagnosis not present

## 2015-12-15 DIAGNOSIS — R1312 Dysphagia, oropharyngeal phase: Secondary | ICD-10-CM | POA: Diagnosis not present

## 2015-12-15 DIAGNOSIS — R279 Unspecified lack of coordination: Secondary | ICD-10-CM | POA: Diagnosis not present

## 2015-12-15 DIAGNOSIS — J9690 Respiratory failure, unspecified, unspecified whether with hypoxia or hypercapnia: Secondary | ICD-10-CM | POA: Diagnosis not present

## 2015-12-15 DIAGNOSIS — R41841 Cognitive communication deficit: Secondary | ICD-10-CM | POA: Diagnosis not present

## 2015-12-16 DIAGNOSIS — J9601 Acute respiratory failure with hypoxia: Secondary | ICD-10-CM | POA: Diagnosis not present

## 2015-12-16 DIAGNOSIS — R1312 Dysphagia, oropharyngeal phase: Secondary | ICD-10-CM | POA: Diagnosis not present

## 2015-12-16 DIAGNOSIS — M6281 Muscle weakness (generalized): Secondary | ICD-10-CM | POA: Diagnosis not present

## 2015-12-16 DIAGNOSIS — R41841 Cognitive communication deficit: Secondary | ICD-10-CM | POA: Diagnosis not present

## 2015-12-16 DIAGNOSIS — J9690 Respiratory failure, unspecified, unspecified whether with hypoxia or hypercapnia: Secondary | ICD-10-CM | POA: Diagnosis not present

## 2015-12-16 DIAGNOSIS — R279 Unspecified lack of coordination: Secondary | ICD-10-CM | POA: Diagnosis not present

## 2015-12-19 DIAGNOSIS — J9601 Acute respiratory failure with hypoxia: Secondary | ICD-10-CM | POA: Diagnosis not present

## 2015-12-19 DIAGNOSIS — J9690 Respiratory failure, unspecified, unspecified whether with hypoxia or hypercapnia: Secondary | ICD-10-CM | POA: Diagnosis not present

## 2015-12-19 DIAGNOSIS — M6281 Muscle weakness (generalized): Secondary | ICD-10-CM | POA: Diagnosis not present

## 2015-12-19 DIAGNOSIS — R1312 Dysphagia, oropharyngeal phase: Secondary | ICD-10-CM | POA: Diagnosis not present

## 2015-12-19 DIAGNOSIS — R41841 Cognitive communication deficit: Secondary | ICD-10-CM | POA: Diagnosis not present

## 2015-12-19 DIAGNOSIS — R279 Unspecified lack of coordination: Secondary | ICD-10-CM | POA: Diagnosis not present

## 2015-12-20 DIAGNOSIS — J9601 Acute respiratory failure with hypoxia: Secondary | ICD-10-CM | POA: Diagnosis not present

## 2015-12-20 DIAGNOSIS — M6281 Muscle weakness (generalized): Secondary | ICD-10-CM | POA: Diagnosis not present

## 2015-12-20 DIAGNOSIS — R1312 Dysphagia, oropharyngeal phase: Secondary | ICD-10-CM | POA: Diagnosis not present

## 2015-12-20 DIAGNOSIS — R41841 Cognitive communication deficit: Secondary | ICD-10-CM | POA: Diagnosis not present

## 2015-12-20 DIAGNOSIS — R279 Unspecified lack of coordination: Secondary | ICD-10-CM | POA: Diagnosis not present

## 2015-12-20 DIAGNOSIS — J9690 Respiratory failure, unspecified, unspecified whether with hypoxia or hypercapnia: Secondary | ICD-10-CM | POA: Diagnosis not present

## 2015-12-21 DIAGNOSIS — R41841 Cognitive communication deficit: Secondary | ICD-10-CM | POA: Diagnosis not present

## 2015-12-21 DIAGNOSIS — R1312 Dysphagia, oropharyngeal phase: Secondary | ICD-10-CM | POA: Diagnosis not present

## 2015-12-21 DIAGNOSIS — J9601 Acute respiratory failure with hypoxia: Secondary | ICD-10-CM | POA: Diagnosis not present

## 2015-12-21 DIAGNOSIS — J9690 Respiratory failure, unspecified, unspecified whether with hypoxia or hypercapnia: Secondary | ICD-10-CM | POA: Diagnosis not present

## 2015-12-21 DIAGNOSIS — R279 Unspecified lack of coordination: Secondary | ICD-10-CM | POA: Diagnosis not present

## 2015-12-21 DIAGNOSIS — M6281 Muscle weakness (generalized): Secondary | ICD-10-CM | POA: Diagnosis not present

## 2015-12-22 DIAGNOSIS — R41841 Cognitive communication deficit: Secondary | ICD-10-CM | POA: Diagnosis not present

## 2015-12-22 DIAGNOSIS — J9690 Respiratory failure, unspecified, unspecified whether with hypoxia or hypercapnia: Secondary | ICD-10-CM | POA: Diagnosis not present

## 2015-12-22 DIAGNOSIS — R1312 Dysphagia, oropharyngeal phase: Secondary | ICD-10-CM | POA: Diagnosis not present

## 2015-12-22 DIAGNOSIS — M6281 Muscle weakness (generalized): Secondary | ICD-10-CM | POA: Diagnosis not present

## 2015-12-22 DIAGNOSIS — J9601 Acute respiratory failure with hypoxia: Secondary | ICD-10-CM | POA: Diagnosis not present

## 2015-12-22 DIAGNOSIS — R279 Unspecified lack of coordination: Secondary | ICD-10-CM | POA: Diagnosis not present

## 2015-12-23 DIAGNOSIS — R279 Unspecified lack of coordination: Secondary | ICD-10-CM | POA: Diagnosis not present

## 2015-12-23 DIAGNOSIS — R41841 Cognitive communication deficit: Secondary | ICD-10-CM | POA: Diagnosis not present

## 2015-12-23 DIAGNOSIS — J9601 Acute respiratory failure with hypoxia: Secondary | ICD-10-CM | POA: Diagnosis not present

## 2015-12-23 DIAGNOSIS — J9690 Respiratory failure, unspecified, unspecified whether with hypoxia or hypercapnia: Secondary | ICD-10-CM | POA: Diagnosis not present

## 2015-12-23 DIAGNOSIS — R1312 Dysphagia, oropharyngeal phase: Secondary | ICD-10-CM | POA: Diagnosis not present

## 2015-12-23 DIAGNOSIS — M6281 Muscle weakness (generalized): Secondary | ICD-10-CM | POA: Diagnosis not present

## 2015-12-26 DIAGNOSIS — J9690 Respiratory failure, unspecified, unspecified whether with hypoxia or hypercapnia: Secondary | ICD-10-CM | POA: Diagnosis not present

## 2015-12-26 DIAGNOSIS — R279 Unspecified lack of coordination: Secondary | ICD-10-CM | POA: Diagnosis not present

## 2015-12-26 DIAGNOSIS — R1312 Dysphagia, oropharyngeal phase: Secondary | ICD-10-CM | POA: Diagnosis not present

## 2015-12-26 DIAGNOSIS — J9601 Acute respiratory failure with hypoxia: Secondary | ICD-10-CM | POA: Diagnosis not present

## 2015-12-26 DIAGNOSIS — M6281 Muscle weakness (generalized): Secondary | ICD-10-CM | POA: Diagnosis not present

## 2015-12-26 DIAGNOSIS — R41841 Cognitive communication deficit: Secondary | ICD-10-CM | POA: Diagnosis not present

## 2015-12-27 DIAGNOSIS — M6281 Muscle weakness (generalized): Secondary | ICD-10-CM | POA: Diagnosis not present

## 2015-12-27 DIAGNOSIS — J9690 Respiratory failure, unspecified, unspecified whether with hypoxia or hypercapnia: Secondary | ICD-10-CM | POA: Diagnosis not present

## 2015-12-27 DIAGNOSIS — R41841 Cognitive communication deficit: Secondary | ICD-10-CM | POA: Diagnosis not present

## 2015-12-27 DIAGNOSIS — J9601 Acute respiratory failure with hypoxia: Secondary | ICD-10-CM | POA: Diagnosis not present

## 2015-12-27 DIAGNOSIS — R279 Unspecified lack of coordination: Secondary | ICD-10-CM | POA: Diagnosis not present

## 2015-12-27 DIAGNOSIS — R1312 Dysphagia, oropharyngeal phase: Secondary | ICD-10-CM | POA: Diagnosis not present

## 2015-12-28 DIAGNOSIS — R41841 Cognitive communication deficit: Secondary | ICD-10-CM | POA: Diagnosis not present

## 2015-12-28 DIAGNOSIS — J9690 Respiratory failure, unspecified, unspecified whether with hypoxia or hypercapnia: Secondary | ICD-10-CM | POA: Diagnosis not present

## 2015-12-28 DIAGNOSIS — R1312 Dysphagia, oropharyngeal phase: Secondary | ICD-10-CM | POA: Diagnosis not present

## 2015-12-28 DIAGNOSIS — M6281 Muscle weakness (generalized): Secondary | ICD-10-CM | POA: Diagnosis not present

## 2015-12-28 DIAGNOSIS — J9601 Acute respiratory failure with hypoxia: Secondary | ICD-10-CM | POA: Diagnosis not present

## 2015-12-28 DIAGNOSIS — R279 Unspecified lack of coordination: Secondary | ICD-10-CM | POA: Diagnosis not present

## 2015-12-29 DIAGNOSIS — S42302A Unspecified fracture of shaft of humerus, left arm, initial encounter for closed fracture: Secondary | ICD-10-CM | POA: Diagnosis not present

## 2015-12-29 DIAGNOSIS — R1312 Dysphagia, oropharyngeal phase: Secondary | ICD-10-CM | POA: Diagnosis not present

## 2015-12-29 DIAGNOSIS — J9690 Respiratory failure, unspecified, unspecified whether with hypoxia or hypercapnia: Secondary | ICD-10-CM | POA: Diagnosis not present

## 2015-12-29 DIAGNOSIS — F0391 Unspecified dementia with behavioral disturbance: Secondary | ICD-10-CM | POA: Diagnosis not present

## 2015-12-29 DIAGNOSIS — M6281 Muscle weakness (generalized): Secondary | ICD-10-CM | POA: Diagnosis not present

## 2015-12-29 DIAGNOSIS — I82409 Acute embolism and thrombosis of unspecified deep veins of unspecified lower extremity: Secondary | ICD-10-CM | POA: Diagnosis not present

## 2015-12-29 DIAGNOSIS — K219 Gastro-esophageal reflux disease without esophagitis: Secondary | ICD-10-CM | POA: Diagnosis not present

## 2015-12-29 DIAGNOSIS — J9601 Acute respiratory failure with hypoxia: Secondary | ICD-10-CM | POA: Diagnosis not present

## 2015-12-29 DIAGNOSIS — R279 Unspecified lack of coordination: Secondary | ICD-10-CM | POA: Diagnosis not present

## 2015-12-29 DIAGNOSIS — G7000 Myasthenia gravis without (acute) exacerbation: Secondary | ICD-10-CM | POA: Diagnosis not present

## 2015-12-29 DIAGNOSIS — R41841 Cognitive communication deficit: Secondary | ICD-10-CM | POA: Diagnosis not present

## 2015-12-30 DIAGNOSIS — R279 Unspecified lack of coordination: Secondary | ICD-10-CM | POA: Diagnosis not present

## 2015-12-30 DIAGNOSIS — M6281 Muscle weakness (generalized): Secondary | ICD-10-CM | POA: Diagnosis not present

## 2015-12-30 DIAGNOSIS — J9690 Respiratory failure, unspecified, unspecified whether with hypoxia or hypercapnia: Secondary | ICD-10-CM | POA: Diagnosis not present

## 2015-12-30 DIAGNOSIS — J9601 Acute respiratory failure with hypoxia: Secondary | ICD-10-CM | POA: Diagnosis not present

## 2015-12-30 DIAGNOSIS — R1312 Dysphagia, oropharyngeal phase: Secondary | ICD-10-CM | POA: Diagnosis not present

## 2015-12-30 DIAGNOSIS — R41841 Cognitive communication deficit: Secondary | ICD-10-CM | POA: Diagnosis not present

## 2016-01-02 DIAGNOSIS — J9690 Respiratory failure, unspecified, unspecified whether with hypoxia or hypercapnia: Secondary | ICD-10-CM | POA: Diagnosis not present

## 2016-01-02 DIAGNOSIS — R1312 Dysphagia, oropharyngeal phase: Secondary | ICD-10-CM | POA: Diagnosis not present

## 2016-01-02 DIAGNOSIS — M6281 Muscle weakness (generalized): Secondary | ICD-10-CM | POA: Diagnosis not present

## 2016-01-02 DIAGNOSIS — R41841 Cognitive communication deficit: Secondary | ICD-10-CM | POA: Diagnosis not present

## 2016-01-02 DIAGNOSIS — R279 Unspecified lack of coordination: Secondary | ICD-10-CM | POA: Diagnosis not present

## 2016-01-02 DIAGNOSIS — J9601 Acute respiratory failure with hypoxia: Secondary | ICD-10-CM | POA: Diagnosis not present

## 2016-01-03 DIAGNOSIS — R279 Unspecified lack of coordination: Secondary | ICD-10-CM | POA: Diagnosis not present

## 2016-01-03 DIAGNOSIS — J9601 Acute respiratory failure with hypoxia: Secondary | ICD-10-CM | POA: Diagnosis not present

## 2016-01-03 DIAGNOSIS — J9690 Respiratory failure, unspecified, unspecified whether with hypoxia or hypercapnia: Secondary | ICD-10-CM | POA: Diagnosis not present

## 2016-01-03 DIAGNOSIS — M6281 Muscle weakness (generalized): Secondary | ICD-10-CM | POA: Diagnosis not present

## 2016-01-03 DIAGNOSIS — R1312 Dysphagia, oropharyngeal phase: Secondary | ICD-10-CM | POA: Diagnosis not present

## 2016-01-03 DIAGNOSIS — R41841 Cognitive communication deficit: Secondary | ICD-10-CM | POA: Diagnosis not present

## 2016-01-04 DIAGNOSIS — J9690 Respiratory failure, unspecified, unspecified whether with hypoxia or hypercapnia: Secondary | ICD-10-CM | POA: Diagnosis not present

## 2016-01-04 DIAGNOSIS — R41841 Cognitive communication deficit: Secondary | ICD-10-CM | POA: Diagnosis not present

## 2016-01-04 DIAGNOSIS — R1312 Dysphagia, oropharyngeal phase: Secondary | ICD-10-CM | POA: Diagnosis not present

## 2016-01-04 DIAGNOSIS — M6281 Muscle weakness (generalized): Secondary | ICD-10-CM | POA: Diagnosis not present

## 2016-01-04 DIAGNOSIS — J9601 Acute respiratory failure with hypoxia: Secondary | ICD-10-CM | POA: Diagnosis not present

## 2016-01-04 DIAGNOSIS — R279 Unspecified lack of coordination: Secondary | ICD-10-CM | POA: Diagnosis not present

## 2016-01-05 DIAGNOSIS — R41841 Cognitive communication deficit: Secondary | ICD-10-CM | POA: Diagnosis not present

## 2016-01-05 DIAGNOSIS — J9601 Acute respiratory failure with hypoxia: Secondary | ICD-10-CM | POA: Diagnosis not present

## 2016-01-05 DIAGNOSIS — R1312 Dysphagia, oropharyngeal phase: Secondary | ICD-10-CM | POA: Diagnosis not present

## 2016-01-05 DIAGNOSIS — M6281 Muscle weakness (generalized): Secondary | ICD-10-CM | POA: Diagnosis not present

## 2016-01-05 DIAGNOSIS — R279 Unspecified lack of coordination: Secondary | ICD-10-CM | POA: Diagnosis not present

## 2016-01-05 DIAGNOSIS — J9690 Respiratory failure, unspecified, unspecified whether with hypoxia or hypercapnia: Secondary | ICD-10-CM | POA: Diagnosis not present

## 2016-01-06 DIAGNOSIS — J9601 Acute respiratory failure with hypoxia: Secondary | ICD-10-CM | POA: Diagnosis not present

## 2016-01-06 DIAGNOSIS — J9690 Respiratory failure, unspecified, unspecified whether with hypoxia or hypercapnia: Secondary | ICD-10-CM | POA: Diagnosis not present

## 2016-01-06 DIAGNOSIS — M6281 Muscle weakness (generalized): Secondary | ICD-10-CM | POA: Diagnosis not present

## 2016-01-06 DIAGNOSIS — R1312 Dysphagia, oropharyngeal phase: Secondary | ICD-10-CM | POA: Diagnosis not present

## 2016-01-06 DIAGNOSIS — R279 Unspecified lack of coordination: Secondary | ICD-10-CM | POA: Diagnosis not present

## 2016-01-06 DIAGNOSIS — R41841 Cognitive communication deficit: Secondary | ICD-10-CM | POA: Diagnosis not present

## 2016-01-08 DIAGNOSIS — R279 Unspecified lack of coordination: Secondary | ICD-10-CM | POA: Diagnosis not present

## 2016-01-08 DIAGNOSIS — J9601 Acute respiratory failure with hypoxia: Secondary | ICD-10-CM | POA: Diagnosis not present

## 2016-01-08 DIAGNOSIS — J9690 Respiratory failure, unspecified, unspecified whether with hypoxia or hypercapnia: Secondary | ICD-10-CM | POA: Diagnosis not present

## 2016-01-08 DIAGNOSIS — R1312 Dysphagia, oropharyngeal phase: Secondary | ICD-10-CM | POA: Diagnosis not present

## 2016-01-08 DIAGNOSIS — M6281 Muscle weakness (generalized): Secondary | ICD-10-CM | POA: Diagnosis not present

## 2016-01-08 DIAGNOSIS — R41841 Cognitive communication deficit: Secondary | ICD-10-CM | POA: Diagnosis not present

## 2016-01-09 DIAGNOSIS — S42224D 2-part nondisplaced fracture of surgical neck of right humerus, subsequent encounter for fracture with routine healing: Secondary | ICD-10-CM | POA: Diagnosis not present

## 2016-01-10 ENCOUNTER — Encounter: Payer: Self-pay | Admitting: Neurology

## 2016-01-10 ENCOUNTER — Ambulatory Visit (INDEPENDENT_AMBULATORY_CARE_PROVIDER_SITE_OTHER): Payer: Medicare Other | Admitting: Neurology

## 2016-01-10 VITALS — BP 131/86 | HR 86 | Ht 73.0 in | Wt 197.0 lb

## 2016-01-10 DIAGNOSIS — G7000 Myasthenia gravis without (acute) exacerbation: Secondary | ICD-10-CM | POA: Diagnosis not present

## 2016-01-10 DIAGNOSIS — M6281 Muscle weakness (generalized): Secondary | ICD-10-CM | POA: Diagnosis not present

## 2016-01-10 DIAGNOSIS — S42201S Unspecified fracture of upper end of right humerus, sequela: Secondary | ICD-10-CM | POA: Diagnosis not present

## 2016-01-10 DIAGNOSIS — Z5181 Encounter for therapeutic drug level monitoring: Secondary | ICD-10-CM | POA: Diagnosis not present

## 2016-01-10 DIAGNOSIS — R279 Unspecified lack of coordination: Secondary | ICD-10-CM | POA: Diagnosis not present

## 2016-01-10 MED ORDER — PREDNISONE 5 MG PO TABS: ORAL_TABLET | ORAL | 5 refills | Status: DC

## 2016-01-10 NOTE — Patient Instructions (Signed)
   With the prednisone, begin 12.5 mg daily for 4 weeks, then begin 10 mg daily, call if there is any increased weakness or problems with swallowing.

## 2016-01-10 NOTE — Progress Notes (Signed)
Reason for visit: Myasthenia gravis  Victor Durham. is an 60 y.o. male  History of present illness:  Victor Durham is a 60 year old right-handed white male with a history of myasthenia gravis with pharyngeal features. The patient has been on Imuran taking 50 mg, 2 tablets daily,and he has been on prednisone 15 mg a day, and he takes Mestinon 90 mg 5 times daily. The patient does well with these medications, he denies any issues with swallowing or choking. The patient denies any abdominal cramps or diarrhea. He has had a recent fall and fractured the humerus on the right. He did not require surgery. He has essentially recovered from this. He returns for an evaluation.  Past Medical History:  Diagnosis Date  . Anxiety   . Arm fracture 11/2015  . DVT (deep venous thrombosis) (HCC)   . Dysphagia 06/30/12  . High cholesterol   . Hypertension   . Myasthenia gravis (HCC)   . Rib fractures 11/2015    Past Surgical History:  Procedure Laterality Date  . ESOPHAGOGASTRODUODENOSCOPY  07/11/2012   Procedure: ESOPHAGOGASTRODUODENOSCOPY (EGD);  Surgeon: Shirley Friar, MD;  Location: [facility];  Service: Endoscopy;  Laterality: N/A;  BEDSIDE  . TEE WITHOUT CARDIOVERSION N/A 09/09/2012   Procedure: TRANSESOPHAGEAL ECHOCARDIOGRAM (TEE);  Surgeon: Dolores Patty, MD;  Location: [facility] ENDOSCOPY;  Service: Cardiovascular;  Laterality: N/A;  . TRACHEOSTOMY N/A     Family History  Problem Relation Age of Onset  . Myasthenia gravis Father     Social history:  reports that he has never smoked. He has never used smokeless tobacco. He reports that he does not drink alcohol or use drugs.   No Known Allergies  Medications:  Prior to Admission medications   Medication Sig Start Date End Date Taking? Authorizing Provider  acetaminophen (TYLENOL) 325 MG tablet Take 650 mg by mouth 2 (two) times daily. Take 2 tablets twice daily at 6am and 9pm    Historical Provider, MD  albuterol  (PROVENTIL) (2.5 MG/3ML) 0.083% nebulizer solution Take 2.5 mg by nebulization every 8 (eight) hours as needed for wheezing or shortness of breath.    Historical Provider, MD  aspirin 81 MG chewable tablet Give 81 mg by tube daily.    Historical Provider, MD  atorvastatin (LIPITOR) 20 MG tablet Take 1 tablet (20 mg total) by mouth daily. 10/13/14   Dessa Phi, MD  azaTHIOprine (IMURAN) 50 MG tablet Take 2 tablets (100 mg total) by mouth daily. 11/25/12   York Spaniel, MD  docusate sodium (COLACE) 100 MG capsule Take 1 capsule (100 mg total) by mouth every 12 (twelve) hours. 11/30/15   Arby Barrette, MD  Multiple Vitamins-Minerals (CERTAGEN PO) Take by mouth daily.    Historical Provider, MD  oxyCODONE-acetaminophen (PERCOCET/ROXICET) 5-325 MG tablet Take 2 tablets by mouth every 4 (four) hours as needed for severe pain. 11/30/15   Arby Barrette, MD  pantoprazole (PROTONIX) 40 MG tablet Take 40 mg by mouth daily.    Historical Provider, MD  predniSONE (DELTASONE) 5 MG tablet 12.5 mg daily for 4 weeks, then take 10 mg daily 01/10/16   York Spaniel, MD  pyridostigmine (MESTINON) 60 MG tablet Take 90 mg by mouth 5 (five) times daily. Take 1 and 1/2 tabs 5 times daily.    Historical Provider, MD  risperiDONE (RISPERDAL M-TABS) 1 MG disintegrating tablet Take 1 mg by mouth 2 (two) times daily.    Historical Provider, MD  risperiDONE microspheres (RISPERDAL CONSTA) 25  MG injection Inject 25 mg into the muscle every 14 (fourteen) days.    Historical Provider, MD  warfarin (COUMADIN) 1 MG tablet Take 7.5 mg by mouth daily.     Historical Provider, MD    ROS:  Out of a complete 14 system review of symptoms, the patient complains only of the following symptoms, and all other reviewed systems are negative.  Review of systems is essentially negative.  Blood pressure 131/86, pulse 86, height 6\' 1"  (1.854 m), weight 197 lb (89.4 kg).  Physical Exam  General: The patient is alert and cooperative at  the time of the examination.  Skin: No significant peripheral edema is noted.   Neurologic Exam  Mental status: The patient is alert and oriented x 3 at the time of the examination. The patient has apparent normal recent and remote memory, with an apparently normal attention span and concentration ability.   Cranial nerves: Facial symmetry is present. Speech is normal, no aphasia or dysarthria is noted. Extraocular movements are full. Visual fields are full. The patient has good facial muscle strength and strength with jaw opening and closure.  Motor: The patient has good strength in all 4 extremities.  Sensory examination: Soft touch sensation is symmetric on the face, arms, and legs.  Coordination: The patient has good finger-nose-finger and heel-to-shin bilaterally.  Gait and station: The patient has a normal gait. Tandem gait is normal. Romberg is negative. No drift is seen.  Reflexes: Deep tendon reflexes are symmetric.   Assessment/Plan:  1. Myasthenia gravis  The patient is doing well on his current medication regimen, we will continue to lower the prednisone dosing. He will go to 12.5 mg daily of prednisone for 4 weeks, then go to 10 mg daily. He will follow-up in 6 months. If the patient has issues with the prednisone taper, he is to contact our office. Blood work will be done today.  Marlan Palau MD 01/10/2016 1:37 PM  [facility] [address] [place], [place] [address]  Phone [phone] Fax [phone]

## 2016-01-11 ENCOUNTER — Telehealth: Payer: Self-pay

## 2016-01-11 DIAGNOSIS — R279 Unspecified lack of coordination: Secondary | ICD-10-CM | POA: Diagnosis not present

## 2016-01-11 DIAGNOSIS — S42201S Unspecified fracture of upper end of right humerus, sequela: Secondary | ICD-10-CM | POA: Diagnosis not present

## 2016-01-11 DIAGNOSIS — M6281 Muscle weakness (generalized): Secondary | ICD-10-CM | POA: Diagnosis not present

## 2016-01-11 LAB — COMPREHENSIVE METABOLIC PANEL
A/G RATIO: 1.6 (ref 1.2–2.2)
ALBUMIN: 4.4 g/dL (ref 3.5–5.5)
ALT: 20 IU/L (ref 0–44)
AST: 19 IU/L (ref 0–40)
Alkaline Phosphatase: 76 IU/L (ref 39–117)
BUN / CREAT RATIO: 26 — AB (ref 9–20)
BUN: 22 mg/dL (ref 6–24)
Bilirubin Total: 0.6 mg/dL (ref 0.0–1.2)
CALCIUM: 9.2 mg/dL (ref 8.7–10.2)
CHLORIDE: 104 mmol/L (ref 96–106)
CO2: 24 mmol/L (ref 18–29)
CREATININE: 0.86 mg/dL (ref 0.76–1.27)
GFR, EST AFRICAN AMERICAN: 110 mL/min/{1.73_m2} (ref >59)
GFR, EST NON AFRICAN AMERICAN: 95 mL/min/{1.73_m2} (ref >59)
GLOBULIN, TOTAL: 2.8 g/dL (ref 1.5–4.5)
Glucose: 108 mg/dL — ABNORMAL HIGH (ref 65–99)
POTASSIUM: 4.6 mmol/L (ref 3.5–5.2)
SODIUM: 145 mmol/L — AB (ref 134–144)
TOTAL PROTEIN: 7.2 g/dL (ref 6.0–8.5)

## 2016-01-11 LAB — CBC WITH DIFFERENTIAL/PLATELET
BASOS ABS: 0 10*3/uL (ref 0.0–0.2)
Basos: 0 %
EOS (ABSOLUTE): 0 10*3/uL (ref 0.0–0.4)
Eos: 0 %
HEMOGLOBIN: 15.2 g/dL (ref 12.6–17.7)
Hematocrit: 46.2 % (ref 37.5–51.0)
Immature Grans (Abs): 0 10*3/uL (ref 0.0–0.1)
Immature Granulocytes: 0 %
LYMPHS ABS: 0.4 10*3/uL — AB (ref 0.7–3.1)
Lymphs: 6 %
MCH: 31.5 pg (ref 26.6–33.0)
MCHC: 32.9 g/dL (ref 31.5–35.7)
MCV: 96 fL (ref 79–97)
MONOS ABS: 0.2 10*3/uL (ref 0.1–0.9)
Monocytes: 3 %
NEUTROS ABS: 6.5 10*3/uL (ref 1.4–7.0)
Neutrophils: 91 %
Platelets: 180 10*3/uL (ref 150–379)
RBC: 4.83 x10E6/uL (ref 4.14–5.80)
RDW: 17.7 % — AB (ref 12.3–15.4)
WBC: 7.2 10*3/uL (ref 3.4–10.8)

## 2016-01-11 NOTE — Telephone Encounter (Signed)
-----   Message from York Spaniel, MD sent at 01/11/2016  7:13 AM EDT ----- The blood work results are unremarkable, with exception of a minimal elevation of the sodium level. Please call the patient. ----- Message ----- From: Nell Range Lab Results In Sent: 01/11/2016   5:39 AM To: York Spaniel, MD

## 2016-01-11 NOTE — Telephone Encounter (Signed)
Called pt's # as listed. Was transferred to [facility] nurses' station but no answer. Called pt's dad (on [place]) w/ lab results. May call back w/ additional questions/conerns.

## 2016-01-12 DIAGNOSIS — R279 Unspecified lack of coordination: Secondary | ICD-10-CM | POA: Diagnosis not present

## 2016-01-12 DIAGNOSIS — S42201S Unspecified fracture of upper end of right humerus, sequela: Secondary | ICD-10-CM | POA: Diagnosis not present

## 2016-01-12 DIAGNOSIS — M6281 Muscle weakness (generalized): Secondary | ICD-10-CM | POA: Diagnosis not present

## 2016-01-13 DIAGNOSIS — R279 Unspecified lack of coordination: Secondary | ICD-10-CM | POA: Diagnosis not present

## 2016-01-13 DIAGNOSIS — M6281 Muscle weakness (generalized): Secondary | ICD-10-CM | POA: Diagnosis not present

## 2016-01-13 DIAGNOSIS — S42201S Unspecified fracture of upper end of right humerus, sequela: Secondary | ICD-10-CM | POA: Diagnosis not present

## 2016-01-16 DIAGNOSIS — M6281 Muscle weakness (generalized): Secondary | ICD-10-CM | POA: Diagnosis not present

## 2016-01-16 DIAGNOSIS — D649 Anemia, unspecified: Secondary | ICD-10-CM | POA: Diagnosis not present

## 2016-01-16 DIAGNOSIS — S42201S Unspecified fracture of upper end of right humerus, sequela: Secondary | ICD-10-CM | POA: Diagnosis not present

## 2016-01-16 DIAGNOSIS — Z79899 Other long term (current) drug therapy: Secondary | ICD-10-CM | POA: Diagnosis not present

## 2016-01-16 DIAGNOSIS — R279 Unspecified lack of coordination: Secondary | ICD-10-CM | POA: Diagnosis not present

## 2016-01-17 DIAGNOSIS — M6281 Muscle weakness (generalized): Secondary | ICD-10-CM | POA: Diagnosis not present

## 2016-01-17 DIAGNOSIS — S42201S Unspecified fracture of upper end of right humerus, sequela: Secondary | ICD-10-CM | POA: Diagnosis not present

## 2016-01-17 DIAGNOSIS — R279 Unspecified lack of coordination: Secondary | ICD-10-CM | POA: Diagnosis not present

## 2016-01-18 DIAGNOSIS — M6281 Muscle weakness (generalized): Secondary | ICD-10-CM | POA: Diagnosis not present

## 2016-01-18 DIAGNOSIS — S42201S Unspecified fracture of upper end of right humerus, sequela: Secondary | ICD-10-CM | POA: Diagnosis not present

## 2016-01-18 DIAGNOSIS — R279 Unspecified lack of coordination: Secondary | ICD-10-CM | POA: Diagnosis not present

## 2016-01-19 DIAGNOSIS — R279 Unspecified lack of coordination: Secondary | ICD-10-CM | POA: Diagnosis not present

## 2016-01-19 DIAGNOSIS — S42201S Unspecified fracture of upper end of right humerus, sequela: Secondary | ICD-10-CM | POA: Diagnosis not present

## 2016-01-19 DIAGNOSIS — M6281 Muscle weakness (generalized): Secondary | ICD-10-CM | POA: Diagnosis not present

## 2016-01-20 DIAGNOSIS — R279 Unspecified lack of coordination: Secondary | ICD-10-CM | POA: Diagnosis not present

## 2016-01-20 DIAGNOSIS — S42201S Unspecified fracture of upper end of right humerus, sequela: Secondary | ICD-10-CM | POA: Diagnosis not present

## 2016-01-20 DIAGNOSIS — M6281 Muscle weakness (generalized): Secondary | ICD-10-CM | POA: Diagnosis not present

## 2016-01-23 DIAGNOSIS — M6281 Muscle weakness (generalized): Secondary | ICD-10-CM | POA: Diagnosis not present

## 2016-01-23 DIAGNOSIS — R279 Unspecified lack of coordination: Secondary | ICD-10-CM | POA: Diagnosis not present

## 2016-01-23 DIAGNOSIS — S42201S Unspecified fracture of upper end of right humerus, sequela: Secondary | ICD-10-CM | POA: Diagnosis not present

## 2016-01-24 DIAGNOSIS — M6281 Muscle weakness (generalized): Secondary | ICD-10-CM | POA: Diagnosis not present

## 2016-01-24 DIAGNOSIS — S42201S Unspecified fracture of upper end of right humerus, sequela: Secondary | ICD-10-CM | POA: Diagnosis not present

## 2016-01-24 DIAGNOSIS — R279 Unspecified lack of coordination: Secondary | ICD-10-CM | POA: Diagnosis not present

## 2016-01-25 DIAGNOSIS — M6281 Muscle weakness (generalized): Secondary | ICD-10-CM | POA: Diagnosis not present

## 2016-01-25 DIAGNOSIS — R279 Unspecified lack of coordination: Secondary | ICD-10-CM | POA: Diagnosis not present

## 2016-01-25 DIAGNOSIS — S42201S Unspecified fracture of upper end of right humerus, sequela: Secondary | ICD-10-CM | POA: Diagnosis not present

## 2016-01-26 DIAGNOSIS — F0391 Unspecified dementia with behavioral disturbance: Secondary | ICD-10-CM | POA: Diagnosis not present

## 2016-01-26 DIAGNOSIS — S42201S Unspecified fracture of upper end of right humerus, sequela: Secondary | ICD-10-CM | POA: Diagnosis not present

## 2016-01-26 DIAGNOSIS — G7000 Myasthenia gravis without (acute) exacerbation: Secondary | ICD-10-CM | POA: Diagnosis not present

## 2016-01-26 DIAGNOSIS — K219 Gastro-esophageal reflux disease without esophagitis: Secondary | ICD-10-CM | POA: Diagnosis not present

## 2016-01-26 DIAGNOSIS — M6281 Muscle weakness (generalized): Secondary | ICD-10-CM | POA: Diagnosis not present

## 2016-01-26 DIAGNOSIS — R279 Unspecified lack of coordination: Secondary | ICD-10-CM | POA: Diagnosis not present

## 2016-01-26 DIAGNOSIS — I82409 Acute embolism and thrombosis of unspecified deep veins of unspecified lower extremity: Secondary | ICD-10-CM | POA: Diagnosis not present

## 2016-01-28 DIAGNOSIS — R279 Unspecified lack of coordination: Secondary | ICD-10-CM | POA: Diagnosis not present

## 2016-01-28 DIAGNOSIS — M6281 Muscle weakness (generalized): Secondary | ICD-10-CM | POA: Diagnosis not present

## 2016-01-28 DIAGNOSIS — S42201S Unspecified fracture of upper end of right humerus, sequela: Secondary | ICD-10-CM | POA: Diagnosis not present

## 2016-01-30 DIAGNOSIS — R279 Unspecified lack of coordination: Secondary | ICD-10-CM | POA: Diagnosis not present

## 2016-01-30 DIAGNOSIS — M6281 Muscle weakness (generalized): Secondary | ICD-10-CM | POA: Diagnosis not present

## 2016-01-30 DIAGNOSIS — S42201S Unspecified fracture of upper end of right humerus, sequela: Secondary | ICD-10-CM | POA: Diagnosis not present

## 2016-01-31 DIAGNOSIS — M6281 Muscle weakness (generalized): Secondary | ICD-10-CM | POA: Diagnosis not present

## 2016-01-31 DIAGNOSIS — S42201S Unspecified fracture of upper end of right humerus, sequela: Secondary | ICD-10-CM | POA: Diagnosis not present

## 2016-01-31 DIAGNOSIS — R279 Unspecified lack of coordination: Secondary | ICD-10-CM | POA: Diagnosis not present

## 2016-02-01 DIAGNOSIS — M6281 Muscle weakness (generalized): Secondary | ICD-10-CM | POA: Diagnosis not present

## 2016-02-01 DIAGNOSIS — S42201S Unspecified fracture of upper end of right humerus, sequela: Secondary | ICD-10-CM | POA: Diagnosis not present

## 2016-02-01 DIAGNOSIS — R279 Unspecified lack of coordination: Secondary | ICD-10-CM | POA: Diagnosis not present

## 2016-02-02 DIAGNOSIS — R279 Unspecified lack of coordination: Secondary | ICD-10-CM | POA: Diagnosis not present

## 2016-02-02 DIAGNOSIS — S42201S Unspecified fracture of upper end of right humerus, sequela: Secondary | ICD-10-CM | POA: Diagnosis not present

## 2016-02-02 DIAGNOSIS — M6281 Muscle weakness (generalized): Secondary | ICD-10-CM | POA: Diagnosis not present

## 2016-02-03 DIAGNOSIS — S42201S Unspecified fracture of upper end of right humerus, sequela: Secondary | ICD-10-CM | POA: Diagnosis not present

## 2016-02-03 DIAGNOSIS — M6281 Muscle weakness (generalized): Secondary | ICD-10-CM | POA: Diagnosis not present

## 2016-02-03 DIAGNOSIS — R279 Unspecified lack of coordination: Secondary | ICD-10-CM | POA: Diagnosis not present

## 2016-02-06 DIAGNOSIS — S42201S Unspecified fracture of upper end of right humerus, sequela: Secondary | ICD-10-CM | POA: Diagnosis not present

## 2016-02-06 DIAGNOSIS — M6281 Muscle weakness (generalized): Secondary | ICD-10-CM | POA: Diagnosis not present

## 2016-02-06 DIAGNOSIS — R279 Unspecified lack of coordination: Secondary | ICD-10-CM | POA: Diagnosis not present

## 2016-02-07 DIAGNOSIS — S42224D 2-part nondisplaced fracture of surgical neck of right humerus, subsequent encounter for fracture with routine healing: Secondary | ICD-10-CM | POA: Diagnosis not present

## 2016-02-08 DIAGNOSIS — M6281 Muscle weakness (generalized): Secondary | ICD-10-CM | POA: Diagnosis not present

## 2016-02-08 DIAGNOSIS — S42201S Unspecified fracture of upper end of right humerus, sequela: Secondary | ICD-10-CM | POA: Diagnosis not present

## 2016-02-08 DIAGNOSIS — R279 Unspecified lack of coordination: Secondary | ICD-10-CM | POA: Diagnosis not present

## 2016-02-09 DIAGNOSIS — M6281 Muscle weakness (generalized): Secondary | ICD-10-CM | POA: Diagnosis not present

## 2016-02-09 DIAGNOSIS — S42201S Unspecified fracture of upper end of right humerus, sequela: Secondary | ICD-10-CM | POA: Diagnosis not present

## 2016-02-09 DIAGNOSIS — R279 Unspecified lack of coordination: Secondary | ICD-10-CM | POA: Diagnosis not present

## 2016-02-13 DIAGNOSIS — S42201S Unspecified fracture of upper end of right humerus, sequela: Secondary | ICD-10-CM | POA: Diagnosis not present

## 2016-02-13 DIAGNOSIS — R279 Unspecified lack of coordination: Secondary | ICD-10-CM | POA: Diagnosis not present

## 2016-02-13 DIAGNOSIS — M6281 Muscle weakness (generalized): Secondary | ICD-10-CM | POA: Diagnosis not present

## 2016-02-14 DIAGNOSIS — D649 Anemia, unspecified: Secondary | ICD-10-CM | POA: Diagnosis not present

## 2016-02-15 DIAGNOSIS — S42201S Unspecified fracture of upper end of right humerus, sequela: Secondary | ICD-10-CM | POA: Diagnosis not present

## 2016-02-15 DIAGNOSIS — M6281 Muscle weakness (generalized): Secondary | ICD-10-CM | POA: Diagnosis not present

## 2016-02-15 DIAGNOSIS — R279 Unspecified lack of coordination: Secondary | ICD-10-CM | POA: Diagnosis not present

## 2016-02-17 DIAGNOSIS — M6281 Muscle weakness (generalized): Secondary | ICD-10-CM | POA: Diagnosis not present

## 2016-02-17 DIAGNOSIS — S42201S Unspecified fracture of upper end of right humerus, sequela: Secondary | ICD-10-CM | POA: Diagnosis not present

## 2016-02-17 DIAGNOSIS — R279 Unspecified lack of coordination: Secondary | ICD-10-CM | POA: Diagnosis not present

## 2016-02-20 DIAGNOSIS — S42201S Unspecified fracture of upper end of right humerus, sequela: Secondary | ICD-10-CM | POA: Diagnosis not present

## 2016-02-20 DIAGNOSIS — M6281 Muscle weakness (generalized): Secondary | ICD-10-CM | POA: Diagnosis not present

## 2016-02-20 DIAGNOSIS — R279 Unspecified lack of coordination: Secondary | ICD-10-CM | POA: Diagnosis not present

## 2016-02-22 DIAGNOSIS — M6281 Muscle weakness (generalized): Secondary | ICD-10-CM | POA: Diagnosis not present

## 2016-02-22 DIAGNOSIS — S42201S Unspecified fracture of upper end of right humerus, sequela: Secondary | ICD-10-CM | POA: Diagnosis not present

## 2016-02-22 DIAGNOSIS — R279 Unspecified lack of coordination: Secondary | ICD-10-CM | POA: Diagnosis not present

## 2016-03-12 DIAGNOSIS — D649 Anemia, unspecified: Secondary | ICD-10-CM | POA: Diagnosis not present

## 2016-03-22 DIAGNOSIS — I82409 Acute embolism and thrombosis of unspecified deep veins of unspecified lower extremity: Secondary | ICD-10-CM | POA: Diagnosis not present

## 2016-03-22 DIAGNOSIS — K429 Umbilical hernia without obstruction or gangrene: Secondary | ICD-10-CM | POA: Diagnosis not present

## 2016-03-22 DIAGNOSIS — G7000 Myasthenia gravis without (acute) exacerbation: Secondary | ICD-10-CM | POA: Diagnosis not present

## 2016-03-22 DIAGNOSIS — K219 Gastro-esophageal reflux disease without esophagitis: Secondary | ICD-10-CM | POA: Diagnosis not present

## 2016-03-22 DIAGNOSIS — F0391 Unspecified dementia with behavioral disturbance: Secondary | ICD-10-CM | POA: Diagnosis not present

## 2016-04-11 ENCOUNTER — Telehealth: Payer: Self-pay | Admitting: Neurology

## 2016-04-11 DIAGNOSIS — Z5181 Encounter for therapeutic drug level monitoring: Secondary | ICD-10-CM

## 2016-04-11 NOTE — Telephone Encounter (Signed)
The patient is on Imuran. He is to come in for blood work.

## 2016-04-12 ENCOUNTER — Other Ambulatory Visit (INDEPENDENT_AMBULATORY_CARE_PROVIDER_SITE_OTHER): Payer: Self-pay

## 2016-04-12 DIAGNOSIS — Z5181 Encounter for therapeutic drug level monitoring: Secondary | ICD-10-CM

## 2016-04-12 DIAGNOSIS — Z0289 Encounter for other administrative examinations: Secondary | ICD-10-CM

## 2016-04-13 ENCOUNTER — Telehealth: Payer: Self-pay

## 2016-04-13 LAB — CBC WITH DIFFERENTIAL/PLATELET
BASOS ABS: 0 10*3/uL (ref 0.0–0.2)
Basos: 0 %
EOS (ABSOLUTE): 0 10*3/uL (ref 0.0–0.4)
EOS: 1 %
HEMATOCRIT: 45.6 % (ref 37.5–51.0)
HEMOGLOBIN: 15.6 g/dL (ref 12.6–17.7)
IMMATURE GRANS (ABS): 0 10*3/uL (ref 0.0–0.1)
IMMATURE GRANULOCYTES: 0 %
LYMPHS ABS: 0.5 10*3/uL — AB (ref 0.7–3.1)
LYMPHS: 8 %
MCH: 33.8 pg — ABNORMAL HIGH (ref 26.6–33.0)
MCHC: 34.2 g/dL (ref 31.5–35.7)
MCV: 99 fL — ABNORMAL HIGH (ref 79–97)
MONOCYTES: 6 %
Monocytes Absolute: 0.3 10*3/uL (ref 0.1–0.9)
Neutrophils Absolute: 5 10*3/uL (ref 1.4–7.0)
Neutrophils: 85 %
Platelets: 157 10*3/uL (ref 150–379)
RBC: 4.61 x10E6/uL (ref 4.14–5.80)
RDW: 14.8 % (ref 12.3–15.4)
WBC: 5.8 10*3/uL (ref 3.4–10.8)

## 2016-04-13 LAB — COMPREHENSIVE METABOLIC PANEL
ALBUMIN: 4.3 g/dL (ref 3.6–4.8)
ALK PHOS: 45 IU/L (ref 39–117)
ALT: 15 IU/L (ref 0–44)
AST: 15 IU/L (ref 0–40)
Albumin/Globulin Ratio: 1.7 (ref 1.2–2.2)
BUN / CREAT RATIO: 15 (ref 10–24)
BUN: 17 mg/dL (ref 8–27)
Bilirubin Total: 0.7 mg/dL (ref 0.0–1.2)
CHLORIDE: 101 mmol/L (ref 96–106)
CO2: 22 mmol/L (ref 18–29)
CREATININE: 1.12 mg/dL (ref 0.76–1.27)
Calcium: 9.4 mg/dL (ref 8.6–10.2)
GFR calc non Af Amer: 71 mL/min/{1.73_m2} (ref >59)
GFR, EST AFRICAN AMERICAN: 82 mL/min/{1.73_m2} (ref >59)
GLUCOSE: 110 mg/dL — AB (ref 65–99)
Globulin, Total: 2.6 g/dL (ref 1.5–4.5)
Potassium: 4.7 mmol/L (ref 3.5–5.2)
Sodium: 142 mmol/L (ref 134–144)
TOTAL PROTEIN: 6.9 g/dL (ref 6.0–8.5)

## 2016-04-13 NOTE — Telephone Encounter (Signed)
-----   Message from York Spanielharles K Willis, MD sent at 04/13/2016  7:41 AM EDT -----   The blood work results are unremarkable. Please call the patient.  ----- Message ----- From: Nell RangeInterface, [facility] Lab Results In Sent: 04/13/2016   5:41 AM To: York Spanielharles K Willis, MD

## 2016-04-13 NOTE — Telephone Encounter (Signed)
Called and spoke to [place], pt's caregiver re: unremarkable lab results. Lab results also faxed to her  [phone]. Verbalized understanding and appreciation for call.

## 2016-04-16 DIAGNOSIS — Z79899 Other long term (current) drug therapy: Secondary | ICD-10-CM | POA: Diagnosis not present

## 2016-04-16 DIAGNOSIS — D649 Anemia, unspecified: Secondary | ICD-10-CM | POA: Diagnosis not present

## 2016-04-26 DIAGNOSIS — F0391 Unspecified dementia with behavioral disturbance: Secondary | ICD-10-CM | POA: Diagnosis not present

## 2016-04-26 DIAGNOSIS — I82409 Acute embolism and thrombosis of unspecified deep veins of unspecified lower extremity: Secondary | ICD-10-CM | POA: Diagnosis not present

## 2016-04-26 DIAGNOSIS — G7000 Myasthenia gravis without (acute) exacerbation: Secondary | ICD-10-CM | POA: Diagnosis not present

## 2016-04-26 DIAGNOSIS — K219 Gastro-esophageal reflux disease without esophagitis: Secondary | ICD-10-CM | POA: Diagnosis not present

## 2016-05-14 DIAGNOSIS — D649 Anemia, unspecified: Secondary | ICD-10-CM | POA: Diagnosis not present

## 2016-05-31 DIAGNOSIS — M1990 Unspecified osteoarthritis, unspecified site: Secondary | ICD-10-CM | POA: Diagnosis not present

## 2016-05-31 DIAGNOSIS — F0391 Unspecified dementia with behavioral disturbance: Secondary | ICD-10-CM | POA: Diagnosis not present

## 2016-05-31 DIAGNOSIS — I82409 Acute embolism and thrombosis of unspecified deep veins of unspecified lower extremity: Secondary | ICD-10-CM | POA: Diagnosis not present

## 2016-05-31 DIAGNOSIS — G7000 Myasthenia gravis without (acute) exacerbation: Secondary | ICD-10-CM | POA: Diagnosis not present

## 2016-05-31 DIAGNOSIS — K219 Gastro-esophageal reflux disease without esophagitis: Secondary | ICD-10-CM | POA: Diagnosis not present

## 2016-06-12 DIAGNOSIS — D649 Anemia, unspecified: Secondary | ICD-10-CM | POA: Diagnosis not present

## 2016-06-29 DIAGNOSIS — F0390 Unspecified dementia without behavioral disturbance: Secondary | ICD-10-CM | POA: Diagnosis not present

## 2016-06-29 DIAGNOSIS — I82409 Acute embolism and thrombosis of unspecified deep veins of unspecified lower extremity: Secondary | ICD-10-CM | POA: Diagnosis not present

## 2016-06-29 DIAGNOSIS — G7000 Myasthenia gravis without (acute) exacerbation: Secondary | ICD-10-CM | POA: Diagnosis not present

## 2016-06-29 DIAGNOSIS — M1990 Unspecified osteoarthritis, unspecified site: Secondary | ICD-10-CM | POA: Diagnosis not present

## 2016-06-29 DIAGNOSIS — K219 Gastro-esophageal reflux disease without esophagitis: Secondary | ICD-10-CM | POA: Diagnosis not present

## 2016-07-13 ENCOUNTER — Encounter: Payer: Self-pay | Admitting: Neurology

## 2016-07-13 ENCOUNTER — Ambulatory Visit (INDEPENDENT_AMBULATORY_CARE_PROVIDER_SITE_OTHER): Payer: Medicare Other | Admitting: Neurology

## 2016-07-13 VITALS — BP 134/85 | HR 77 | Ht 73.0 in | Wt 215.5 lb

## 2016-07-13 DIAGNOSIS — G7000 Myasthenia gravis without (acute) exacerbation: Secondary | ICD-10-CM | POA: Diagnosis not present

## 2016-07-13 DIAGNOSIS — Z5181 Encounter for therapeutic drug level monitoring: Secondary | ICD-10-CM

## 2016-07-13 MED ORDER — PREDNISONE 5 MG PO TABS: ORAL_TABLET | ORAL | 2 refills | Status: DC

## 2016-07-13 MED ORDER — PREDNISONE 1 MG PO TABS: ORAL_TABLET | ORAL | 3 refills | Status: DC

## 2016-07-13 NOTE — Progress Notes (Signed)
Reason for visit: Myasthenia gravis  Victor Maplehomas B Liebert Jr. is an 61 y.o. male  History of present illness:  Victor Durham is a 61 year old right-handed white male with a history of myasthenia gravis. The patient has done quite well on Imuran, prednisone, and Mestinon. The patient was reduced from 15 mg daily of the prednisone to a 10 mg daily dose. He has tolerated this well. He returns to the office today for an evaluation. He has not noted any double vision, ptosis, difficulty with chewing or swallowing, or weakness of the extremities. He is able to read or watch TV without difficulty. He does not operate a motor vehicle. He returns for an evaluation.  Past Medical History:  Diagnosis Date  . Anxiety   . Arm fracture 11/2015  . DVT (deep venous thrombosis) (HCC)   . Dysphagia 06/30/12  . High cholesterol   . Hypertension   . Myasthenia gravis (HCC)   . Rib fractures 11/2015    Past Surgical History:  Procedure Laterality Date  . ESOPHAGOGASTRODUODENOSCOPY  07/11/2012   Procedure: ESOPHAGOGASTRODUODENOSCOPY (EGD);  Surgeon: Shirley FriarVincent C. Schooler, MD;  [facility];  Service: Endoscopy;  Laterality: N/A;  BEDSIDE  . TEE WITHOUT CARDIOVERSION N/A 09/09/2012   Procedure: TRANSESOPHAGEAL ECHOCARDIOGRAM (TEE);  Surgeon: Dolores Pattyaniel R Bensimhon, MD;  [facility];  Service: Cardiovascular;  Laterality: N/A;  . TRACHEOSTOMY N/A     Family History  Problem Relation Age of Onset  . Myasthenia gravis Father     Social history:  reports that he has never smoked. He has never used smokeless tobacco. He reports that he does not drink alcohol or use drugs.   No Known Allergies  Medications:  Prior to Admission medications   Medication Sig Start Date End Date Taking? Authorizing Provider  acetaminophen (TYLENOL) 325 MG tablet Take 650 mg by mouth 2 (two) times daily. Take 2 tablets twice daily at 6am and 9pm   Yes Historical Provider, MD  albuterol (PROVENTIL) (2.5  MG/3ML) 0.083% nebulizer solution Take 2.5 mg by nebulization every 8 (eight) hours as needed for wheezing or shortness of breath.   Yes Historical Provider, MD  aspirin 81 MG chewable tablet Give 81 mg by tube daily.   Yes Historical Provider, MD  atorvastatin (LIPITOR) 20 MG tablet Take 1 tablet (20 mg total) by mouth daily. 10/13/14  Yes Josalyn Funches, MD  azaTHIOprine (IMURAN) 50 MG tablet Take 2 tablets (100 mg total) by mouth daily. 11/25/12  Yes York Spanielharles K Furious Chiarelli, MD  docusate sodium (COLACE) 100 MG capsule Take 1 capsule (100 mg total) by mouth every 12 (twelve) hours. 11/30/15  Yes Arby BarretteMarcy Pfeiffer, MD  Multiple Vitamins-Minerals (CERTAGEN PO) Take by mouth daily.   Yes Historical Provider, MD  oxyCODONE-acetaminophen (PERCOCET/ROXICET) 5-325 MG tablet Take 2 tablets by mouth every 4 (four) hours as needed for severe pain. 11/30/15  Yes Arby BarretteMarcy Pfeiffer, MD  pantoprazole (PROTONIX) 40 MG tablet Take 40 mg by mouth daily.   Yes Historical Provider, MD  predniSONE (DELTASONE) 5 MG tablet 5 mg daily 07/13/16  Yes York Spanielharles K Shavon Ashmore, MD  pyridostigmine (MESTINON) 60 MG tablet Take 90 mg by mouth 5 (five) times daily. Take 1 and 1/2 tabs 5 times daily.   Yes Historical Provider, MD  risperiDONE (RISPERDAL M-TABS) 1 MG disintegrating tablet Take 1 mg by mouth 2 (two) times daily.   Yes Historical Provider, MD  risperiDONE microspheres (RISPERDAL CONSTA) 25 MG injection Inject 25 mg into the muscle every 14 (fourteen)  days.   Yes Historical Provider, MD  warfarin (COUMADIN) 1 MG tablet Take 5 mg by mouth daily.    Yes Historical Provider, MD  predniSONE (DELTASONE) 1 MG tablet Begin 4 tablets daily, taper by 1 tablet every 4 weeks until off 07/13/16   York Spaniel, MD    ROS:  Out of a complete 14 system review of symptoms, the patient complains only of the following symptoms, and all other reviewed systems are negative.  No symptoms listed  Blood pressure 134/85, pulse 77, height 6\' 1"  (1.854 m),  weight 215 lb 8 oz (97.8 kg).  Physical Exam  General: The patient is alert and cooperative at the time of the examination.  Skin: No significant peripheral edema is noted.   Neurologic Exam  Mental status: The patient is alert and oriented x 3 at the time of the examination. The patient has apparent normal recent and remote memory, with an apparently normal attention span and concentration ability.   Cranial nerves: Facial symmetry is present. Speech is normal, no aphasia or dysarthria is noted. Extraocular movements are full. Visual fields are full. With superior gaze for 1 minute, no divergence of gaze, ptosis, or subjective double vision was noted.  Motor: The patient has good strength in all 4 extremities. With the arms outstretched 1 minute, no fatigable weakness of the deltoid muscles was noted.  Sensory examination: Soft touch sensation is symmetric on the face, arms, and legs.  Coordination: The patient has good finger-nose-finger and heel-to-shin bilaterally.  Gait and station: The patient has a normal gait. Tandem gait is normal. Romberg is negative. No drift is seen.  Reflexes: Deep tendon reflexes are symmetric.   Assessment/Plan:  1. Myasthenia gravis  The patient seems to be doing quite well on his current medication regimen. I will try to continue to reduce the dose of prednisone slowly. The patient will go down by 1 mg every 4 weeks until he is taking 5 mg daily, he will follow-up through this office in 6 months. He will have blood work done today.  Marlan Palau MD 07/13/2016 9:48 AM  [facility] [address] [place], [place] [address]  Phone [phone] Fax [phone]

## 2016-07-13 NOTE — Patient Instructions (Signed)
  With the prednisone, we will start dosing at 9 mg daily for 4 weeks, then 8 mg daily for 4 weeks, then 7 mg daily for 4 weeks, then 6 mg daily for 4 weeks, then go to 5 mg daily and maintain.  Call if there are worsening symptoms while coming off of the prednisone.

## 2016-07-14 LAB — COMPREHENSIVE METABOLIC PANEL
A/G RATIO: 1.6 (ref 1.2–2.2)
ALT: 16 IU/L (ref 0–44)
AST: 17 IU/L (ref 0–40)
Albumin: 4.4 g/dL (ref 3.6–4.8)
Alkaline Phosphatase: 45 IU/L (ref 39–117)
BUN/Creatinine Ratio: 16 (ref 10–24)
BUN: 16 mg/dL (ref 8–27)
Bilirubin Total: 1 mg/dL (ref 0.0–1.2)
CALCIUM: 9.4 mg/dL (ref 8.6–10.2)
CO2: 23 mmol/L (ref 18–29)
CREATININE: 1.01 mg/dL (ref 0.76–1.27)
Chloride: 102 mmol/L (ref 96–106)
GFR, EST AFRICAN AMERICAN: 93 mL/min/{1.73_m2} (ref >59)
GFR, EST NON AFRICAN AMERICAN: 80 mL/min/{1.73_m2} (ref >59)
Globulin, Total: 2.8 g/dL (ref 1.5–4.5)
Glucose: 100 mg/dL — ABNORMAL HIGH (ref 65–99)
POTASSIUM: 3.9 mmol/L (ref 3.5–5.2)
Sodium: 145 mmol/L — ABNORMAL HIGH (ref 134–144)
TOTAL PROTEIN: 7.2 g/dL (ref 6.0–8.5)

## 2016-07-14 LAB — CBC WITH DIFFERENTIAL/PLATELET
BASOS: 0 %
Basophils Absolute: 0 10*3/uL (ref 0.0–0.2)
EOS (ABSOLUTE): 0.1 10*3/uL (ref 0.0–0.4)
Eos: 1 %
HEMATOCRIT: 46.4 % (ref 37.5–51.0)
HEMOGLOBIN: 15.4 g/dL (ref 13.0–17.7)
IMMATURE GRANS (ABS): 0 10*3/uL (ref 0.0–0.1)
IMMATURE GRANULOCYTES: 0 %
LYMPHS: 11 %
Lymphocytes Absolute: 0.8 10*3/uL (ref 0.7–3.1)
MCH: 33.2 pg — AB (ref 26.6–33.0)
MCHC: 33.2 g/dL (ref 31.5–35.7)
MCV: 100 fL — AB (ref 79–97)
MONOCYTES: 8 %
Monocytes Absolute: 0.6 10*3/uL (ref 0.1–0.9)
NEUTROS ABS: 5.8 10*3/uL (ref 1.4–7.0)
NEUTROS PCT: 80 %
Platelets: 137 10*3/uL — ABNORMAL LOW (ref 150–379)
RBC: 4.64 x10E6/uL (ref 4.14–5.80)
RDW: 14.9 % (ref 12.3–15.4)
WBC: 7.4 10*3/uL (ref 3.4–10.8)

## 2016-07-16 ENCOUNTER — Telehealth: Payer: Self-pay | Admitting: *Deleted

## 2016-07-16 DIAGNOSIS — Z79899 Other long term (current) drug therapy: Secondary | ICD-10-CM | POA: Diagnosis not present

## 2016-07-16 DIAGNOSIS — D649 Anemia, unspecified: Secondary | ICD-10-CM | POA: Diagnosis not present

## 2016-07-16 NOTE — Telephone Encounter (Signed)
-----   Message from York Spanielharles K Willis, MD sent at 07/15/2016  4:25 PM EST ----- Blood work shows minimally elevated sodium level and slightly low platelet level, not clinically significant. Will follow. Please call the patient. ----- Message ----- From: Nell RangeInterface, [facility] Lab Results In Sent: 07/14/2016   5:42 AM To: York Spanielharles K Willis, MD

## 2016-07-16 NOTE — Telephone Encounter (Signed)
[facility] and [facility]. She verbalized understanding. She would like office note faxed so they have it on file.   Advised per Dr Anne Hahnwillis note, he would like patient "The patient will go down by 1 mg every 4 weeks until he is taking 5 mg daily".   Faxed noted to [phone]. Received confirmation.

## 2016-07-26 DIAGNOSIS — F0391 Unspecified dementia with behavioral disturbance: Secondary | ICD-10-CM | POA: Diagnosis not present

## 2016-07-26 DIAGNOSIS — I82409 Acute embolism and thrombosis of unspecified deep veins of unspecified lower extremity: Secondary | ICD-10-CM | POA: Diagnosis not present

## 2016-07-26 DIAGNOSIS — K429 Umbilical hernia without obstruction or gangrene: Secondary | ICD-10-CM | POA: Diagnosis not present

## 2016-07-26 DIAGNOSIS — K219 Gastro-esophageal reflux disease without esophagitis: Secondary | ICD-10-CM | POA: Diagnosis not present

## 2016-07-26 DIAGNOSIS — G7000 Myasthenia gravis without (acute) exacerbation: Secondary | ICD-10-CM | POA: Diagnosis not present

## 2016-08-13 DIAGNOSIS — D649 Anemia, unspecified: Secondary | ICD-10-CM | POA: Diagnosis not present

## 2016-08-23 DIAGNOSIS — F0391 Unspecified dementia with behavioral disturbance: Secondary | ICD-10-CM | POA: Diagnosis not present

## 2016-08-23 DIAGNOSIS — I82409 Acute embolism and thrombosis of unspecified deep veins of unspecified lower extremity: Secondary | ICD-10-CM | POA: Diagnosis not present

## 2016-08-23 DIAGNOSIS — K429 Umbilical hernia without obstruction or gangrene: Secondary | ICD-10-CM | POA: Diagnosis not present

## 2016-08-23 DIAGNOSIS — K219 Gastro-esophageal reflux disease without esophagitis: Secondary | ICD-10-CM | POA: Diagnosis not present

## 2016-08-23 DIAGNOSIS — G7000 Myasthenia gravis without (acute) exacerbation: Secondary | ICD-10-CM | POA: Diagnosis not present

## 2016-09-10 DIAGNOSIS — D649 Anemia, unspecified: Secondary | ICD-10-CM | POA: Diagnosis not present

## 2016-09-27 DIAGNOSIS — K429 Umbilical hernia without obstruction or gangrene: Secondary | ICD-10-CM | POA: Diagnosis not present

## 2016-09-27 DIAGNOSIS — G7000 Myasthenia gravis without (acute) exacerbation: Secondary | ICD-10-CM | POA: Diagnosis not present

## 2016-09-27 DIAGNOSIS — K219 Gastro-esophageal reflux disease without esophagitis: Secondary | ICD-10-CM | POA: Diagnosis not present

## 2016-09-27 DIAGNOSIS — F0391 Unspecified dementia with behavioral disturbance: Secondary | ICD-10-CM | POA: Diagnosis not present

## 2016-09-27 DIAGNOSIS — I82409 Acute embolism and thrombosis of unspecified deep veins of unspecified lower extremity: Secondary | ICD-10-CM | POA: Diagnosis not present

## 2016-10-10 ENCOUNTER — Telehealth: Payer: Self-pay | Admitting: Neurology

## 2016-10-10 NOTE — Telephone Encounter (Signed)
I called the patient trying to get in touch with him to come and get blood work done on Imuran, unable to reach him at the numbers listed.  He has a revisit scheduled for August 3.

## 2016-10-15 DIAGNOSIS — D649 Anemia, unspecified: Secondary | ICD-10-CM | POA: Diagnosis not present

## 2016-10-15 DIAGNOSIS — Z79899 Other long term (current) drug therapy: Secondary | ICD-10-CM | POA: Diagnosis not present

## 2016-10-24 ENCOUNTER — Encounter: Payer: Self-pay | Admitting: Family Medicine

## 2016-10-24 DIAGNOSIS — R41841 Cognitive communication deficit: Secondary | ICD-10-CM | POA: Diagnosis not present

## 2016-10-24 DIAGNOSIS — S2241XA Multiple fractures of ribs, right side, initial encounter for closed fracture: Secondary | ICD-10-CM | POA: Diagnosis not present

## 2016-10-25 DIAGNOSIS — G7000 Myasthenia gravis without (acute) exacerbation: Secondary | ICD-10-CM | POA: Diagnosis not present

## 2016-10-25 DIAGNOSIS — K429 Umbilical hernia without obstruction or gangrene: Secondary | ICD-10-CM | POA: Diagnosis not present

## 2016-10-25 DIAGNOSIS — I82409 Acute embolism and thrombosis of unspecified deep veins of unspecified lower extremity: Secondary | ICD-10-CM | POA: Diagnosis not present

## 2016-10-25 DIAGNOSIS — F0391 Unspecified dementia with behavioral disturbance: Secondary | ICD-10-CM | POA: Diagnosis not present

## 2016-10-25 DIAGNOSIS — K219 Gastro-esophageal reflux disease without esophagitis: Secondary | ICD-10-CM | POA: Diagnosis not present

## 2016-10-25 DIAGNOSIS — R41841 Cognitive communication deficit: Secondary | ICD-10-CM | POA: Diagnosis not present

## 2016-10-25 DIAGNOSIS — S2241XA Multiple fractures of ribs, right side, initial encounter for closed fracture: Secondary | ICD-10-CM | POA: Diagnosis not present

## 2016-10-26 DIAGNOSIS — S2241XA Multiple fractures of ribs, right side, initial encounter for closed fracture: Secondary | ICD-10-CM | POA: Diagnosis not present

## 2016-10-26 DIAGNOSIS — R41841 Cognitive communication deficit: Secondary | ICD-10-CM | POA: Diagnosis not present

## 2016-10-29 DIAGNOSIS — S2241XA Multiple fractures of ribs, right side, initial encounter for closed fracture: Secondary | ICD-10-CM | POA: Diagnosis not present

## 2016-10-29 DIAGNOSIS — R41841 Cognitive communication deficit: Secondary | ICD-10-CM | POA: Diagnosis not present

## 2016-10-30 DIAGNOSIS — R41841 Cognitive communication deficit: Secondary | ICD-10-CM | POA: Diagnosis not present

## 2016-10-30 DIAGNOSIS — S2241XA Multiple fractures of ribs, right side, initial encounter for closed fracture: Secondary | ICD-10-CM | POA: Diagnosis not present

## 2016-10-31 DIAGNOSIS — S2241XA Multiple fractures of ribs, right side, initial encounter for closed fracture: Secondary | ICD-10-CM | POA: Diagnosis not present

## 2016-10-31 DIAGNOSIS — R41841 Cognitive communication deficit: Secondary | ICD-10-CM | POA: Diagnosis not present

## 2016-11-01 DIAGNOSIS — S2241XA Multiple fractures of ribs, right side, initial encounter for closed fracture: Secondary | ICD-10-CM | POA: Diagnosis not present

## 2016-11-01 DIAGNOSIS — R41841 Cognitive communication deficit: Secondary | ICD-10-CM | POA: Diagnosis not present

## 2016-11-02 DIAGNOSIS — S2241XA Multiple fractures of ribs, right side, initial encounter for closed fracture: Secondary | ICD-10-CM | POA: Diagnosis not present

## 2016-11-02 DIAGNOSIS — R41841 Cognitive communication deficit: Secondary | ICD-10-CM | POA: Diagnosis not present

## 2016-11-04 DIAGNOSIS — S2241XA Multiple fractures of ribs, right side, initial encounter for closed fracture: Secondary | ICD-10-CM | POA: Diagnosis not present

## 2016-11-04 DIAGNOSIS — R41841 Cognitive communication deficit: Secondary | ICD-10-CM | POA: Diagnosis not present

## 2016-11-06 DIAGNOSIS — S2241XA Multiple fractures of ribs, right side, initial encounter for closed fracture: Secondary | ICD-10-CM | POA: Diagnosis not present

## 2016-11-06 DIAGNOSIS — R41841 Cognitive communication deficit: Secondary | ICD-10-CM | POA: Diagnosis not present

## 2016-11-07 DIAGNOSIS — R41841 Cognitive communication deficit: Secondary | ICD-10-CM | POA: Diagnosis not present

## 2016-11-07 DIAGNOSIS — S2241XA Multiple fractures of ribs, right side, initial encounter for closed fracture: Secondary | ICD-10-CM | POA: Diagnosis not present

## 2016-11-12 DIAGNOSIS — D649 Anemia, unspecified: Secondary | ICD-10-CM | POA: Diagnosis not present

## 2016-11-29 DIAGNOSIS — F0391 Unspecified dementia with behavioral disturbance: Secondary | ICD-10-CM | POA: Diagnosis not present

## 2016-11-29 DIAGNOSIS — G7000 Myasthenia gravis without (acute) exacerbation: Secondary | ICD-10-CM | POA: Diagnosis not present

## 2016-11-29 DIAGNOSIS — I82409 Acute embolism and thrombosis of unspecified deep veins of unspecified lower extremity: Secondary | ICD-10-CM | POA: Diagnosis not present

## 2016-11-29 DIAGNOSIS — M1990 Unspecified osteoarthritis, unspecified site: Secondary | ICD-10-CM | POA: Diagnosis not present

## 2016-11-29 DIAGNOSIS — K429 Umbilical hernia without obstruction or gangrene: Secondary | ICD-10-CM | POA: Diagnosis not present

## 2016-12-10 DIAGNOSIS — D649 Anemia, unspecified: Secondary | ICD-10-CM | POA: Diagnosis not present

## 2017-01-11 ENCOUNTER — Telehealth: Payer: Self-pay | Admitting: Neurology

## 2017-01-11 ENCOUNTER — Ambulatory Visit: Payer: Medicare Other | Admitting: Neurology

## 2017-01-11 NOTE — Telephone Encounter (Signed)
This patient does not show for a revisit point today.  He has been noncompliant with blood work on Imuran, we will be unable to refill any medications until he has a revisit.

## 2017-01-14 ENCOUNTER — Encounter: Payer: Self-pay | Admitting: Neurology

## 2017-01-14 ENCOUNTER — Encounter: Payer: Self-pay | Admitting: Adult Health

## 2017-01-14 DIAGNOSIS — D649 Anemia, unspecified: Secondary | ICD-10-CM | POA: Diagnosis not present

## 2017-01-14 DIAGNOSIS — Z79899 Other long term (current) drug therapy: Secondary | ICD-10-CM | POA: Diagnosis not present

## 2017-01-24 DIAGNOSIS — F0391 Unspecified dementia with behavioral disturbance: Secondary | ICD-10-CM | POA: Diagnosis not present

## 2017-01-24 DIAGNOSIS — K429 Umbilical hernia without obstruction or gangrene: Secondary | ICD-10-CM | POA: Diagnosis not present

## 2017-01-24 DIAGNOSIS — G7000 Myasthenia gravis without (acute) exacerbation: Secondary | ICD-10-CM | POA: Diagnosis not present

## 2017-01-24 DIAGNOSIS — I82409 Acute embolism and thrombosis of unspecified deep veins of unspecified lower extremity: Secondary | ICD-10-CM | POA: Diagnosis not present

## 2017-01-24 DIAGNOSIS — K219 Gastro-esophageal reflux disease without esophagitis: Secondary | ICD-10-CM | POA: Diagnosis not present

## 2017-02-12 DIAGNOSIS — D649 Anemia, unspecified: Secondary | ICD-10-CM | POA: Diagnosis not present

## 2017-02-14 DIAGNOSIS — H26492 Other secondary cataract, left eye: Secondary | ICD-10-CM | POA: Diagnosis not present

## 2017-03-07 DIAGNOSIS — G7000 Myasthenia gravis without (acute) exacerbation: Secondary | ICD-10-CM | POA: Diagnosis not present

## 2017-03-07 DIAGNOSIS — M1990 Unspecified osteoarthritis, unspecified site: Secondary | ICD-10-CM | POA: Diagnosis not present

## 2017-03-07 DIAGNOSIS — I82409 Acute embolism and thrombosis of unspecified deep veins of unspecified lower extremity: Secondary | ICD-10-CM | POA: Diagnosis not present

## 2017-03-07 DIAGNOSIS — F0391 Unspecified dementia with behavioral disturbance: Secondary | ICD-10-CM | POA: Diagnosis not present

## 2017-03-07 DIAGNOSIS — K429 Umbilical hernia without obstruction or gangrene: Secondary | ICD-10-CM | POA: Diagnosis not present

## 2017-03-11 DIAGNOSIS — D649 Anemia, unspecified: Secondary | ICD-10-CM | POA: Diagnosis not present

## 2017-03-13 ENCOUNTER — Ambulatory Visit (INDEPENDENT_AMBULATORY_CARE_PROVIDER_SITE_OTHER): Payer: Medicare Other | Admitting: Neurology

## 2017-03-13 ENCOUNTER — Encounter: Payer: Self-pay | Admitting: Neurology

## 2017-03-13 ENCOUNTER — Encounter (INDEPENDENT_AMBULATORY_CARE_PROVIDER_SITE_OTHER): Payer: Self-pay

## 2017-03-13 VITALS — BP 118/80 | HR 72 | Ht 73.0 in | Wt 215.0 lb

## 2017-03-13 DIAGNOSIS — Z5181 Encounter for therapeutic drug level monitoring: Secondary | ICD-10-CM

## 2017-03-13 DIAGNOSIS — G7000 Myasthenia gravis without (acute) exacerbation: Secondary | ICD-10-CM | POA: Diagnosis not present

## 2017-03-13 MED ORDER — PREDNISONE 1 MG PO TABS: ORAL_TABLET | ORAL | 4 refills | Status: AC

## 2017-03-13 NOTE — Progress Notes (Signed)
Reason for visit: Myasthenia gravis  Shaw Dobek. is an 61 y.o. male  History of present illness:  Mr. Ammon is a 61 year old right-handed white male with a history of myasthenia gravis. The patient has been on Mestinon, Imuran, and prednisone. He has been able to taper down on the prednisone to 5 mg daily dose without difficulty. He reports no problems whatsoever with the myasthenia, he has not had any generalized fatigue, double vision, ptosis, or muscle weakness. He reports no problems with chewing or swallowing. He returns for an evaluation.   Past Medical History:  Diagnosis Date  . Anxiety   . Arm fracture 11/2015  . DVT (deep venous thrombosis) (HCC)   . Dysphagia 06/30/12  . High cholesterol   . Hypertension   . Myasthenia gravis (HCC)   . Rib fractures 11/2015    Past Surgical History:  Procedure Laterality Date  . ESOPHAGOGASTRODUODENOSCOPY  07/11/2012   Procedure: ESOPHAGOGASTRODUODENOSCOPY (EGD);  Surgeon: Shirley Friar, MD;  Location: [facility];  Service: Endoscopy;  Laterality: N/A;  BEDSIDE  . TEE WITHOUT CARDIOVERSION N/A 09/09/2012   Procedure: TRANSESOPHAGEAL ECHOCARDIOGRAM (TEE);  Surgeon: Dolores Patty, MD;  Location: [facility] ENDOSCOPY;  Service: Cardiovascular;  Laterality: N/A;  . TRACHEOSTOMY N/A     Family History  Problem Relation Age of Onset  . Myasthenia gravis Father     Social history:  reports that he has never smoked. He has never used smokeless tobacco. He reports that he does not drink alcohol or use drugs.   No Known Allergies  Medications:  Prior to Admission medications   Medication Sig Start Date End Date Taking? Authorizing Provider  acetaminophen (TYLENOL) 325 MG tablet Take 650 mg by mouth 2 (two) times daily. Take 2 tablets twice daily at 6am and 9pm   Yes [provider]  albuterol (PROVENTIL) (2.5 MG/3ML) 0.083% nebulizer solution Take 2.5 mg by nebulization every 8 (eight) hours as needed for  wheezing or shortness of breath.   Yes [provider]  aspirin 81 MG chewable tablet Give 81 mg by tube daily.   Yes [provider]  atorvastatin (LIPITOR) 20 MG tablet Take 1 tablet (20 mg total) by mouth daily. 10/13/14  Yes Funches, Josalyn, MD  azaTHIOprine (IMURAN) 50 MG tablet Take 100 mg by mouth daily.   Yes [provider]  docusate sodium (COLACE) 100 MG capsule Take 1 capsule (100 mg total) by mouth every 12 (twelve) hours. 11/30/15  Yes Arby Barrette, MD  Multiple Vitamins-Minerals (CERTA-VITE PO) Take 1 tablet by mouth daily.   Yes [provider]  pantoprazole (PROTONIX) 40 MG tablet Take 40 mg by mouth daily.   Yes [provider]  predniSONE (DELTASONE) 5 MG tablet Take 5 mg by mouth daily with breakfast.   Yes [provider]  pyridostigmine (MESTINON) 60 MG tablet Take 90 mg by mouth 5 (five) times daily. Take 1 and 1/2 tabs 5 times daily.   Yes [provider]  risperiDONE (RISPERDAL) 1 MG tablet Take 1 mg by mouth 2 (two) times daily.   Yes [provider]  risperiDONE microspheres (RISPERDAL CONSTA) 25 MG injection Inject 25 mg into the muscle every 14 (fourteen) days.   Yes [provider]  Sodium Fluoride (LISTERINE TOTAL CARE MT) Use as directed 1 Dose in the mouth or throat 4 (four) times daily.   Yes [provider]  warfarin (COUMADIN) 1 MG tablet Take 5 mg by mouth daily.  Yes [provider]    ROS:  Out of a complete 14 system review of symptoms, the patient complains only of the following symptoms, and all other reviewed systems are negative.  Review of systems is negative.  Blood pressure 118/80, pulse 72, height  (1.854 m), weight 215 lb (97.5 kg), SpO2 (!) 72 %.  Physical Exam  General: The patient is alert and cooperative at the time of the examination.  Skin: No significant peripheral edema is noted.   Neurologic Exam  Mental status: The  patient is alert and oriented x 3 at the time of the examination. The patient has apparent normal recent and remote memory, with an apparently normal attention span and concentration ability.   Cranial nerves: Facial symmetry is present. Speech is normal, no aphasia or dysarthria is noted. Extraocular movements are full. Visual fields are full. With superior gaze for 1 minute, no subjective double vision, divergence of gaze, or ptosis was seen.  Motor: The patient has good strength in all 4 extremities. With the arms outstretched for 1 minute, no fatigable weakness of the deltoid muscles was noted.  Sensory examination: Soft touch sensation is symmetric on the face, arms, and legs.  Coordination: The patient has good finger-nose-finger and heel-to-shin bilaterally.  Gait and station: The patient has a normal gait. Tandem gait is unsteady. Romberg is negative. No drift is seen.  Reflexes: Deep tendon reflexes are symmetric.   Assessment/Plan:  1. Myasthenia gravis  The patient is doing quite well at this time. We will continue the prednisone taper, he will go down on the prednisone to 4 mg daily, and continue the taper by 1 mg every 2 months until off the medication. He will have blood work done today. He will follow-up in 6 months. If he begins having symptoms associated with myasthenia gravis on the prednisone taper, he is to contact our office.  Marlan Palau MD 03/13/2017 9:11 AM  [facility] [address] [place], [place] [address]  Phone [phone] Fax [phone]

## 2017-03-13 NOTE — Patient Instructions (Signed)
   We will start 4 mg daily of prednisone and taper by one tablet every 2 months until off the medication.

## 2017-03-14 ENCOUNTER — Telehealth: Payer: Self-pay | Admitting: Neurology

## 2017-03-14 LAB — COMPREHENSIVE METABOLIC PANEL
A/G RATIO: 1.7 (ref 1.2–2.2)
ALBUMIN: 4.6 g/dL (ref 3.6–4.8)
ALT: 16 IU/L (ref 0–44)
AST: 20 IU/L (ref 0–40)
Alkaline Phosphatase: 46 IU/L (ref 39–117)
BUN/Creatinine Ratio: 16 (ref 10–24)
BUN: 15 mg/dL (ref 8–27)
Bilirubin Total: 1.2 mg/dL (ref 0.0–1.2)
CALCIUM: 9.4 mg/dL (ref 8.6–10.2)
CO2: 20 mmol/L (ref 20–29)
Chloride: 106 mmol/L (ref 96–106)
Creatinine, Ser: 0.92 mg/dL (ref 0.76–1.27)
GFR, EST AFRICAN AMERICAN: 103 mL/min/{1.73_m2} (ref >59)
GFR, EST NON AFRICAN AMERICAN: 89 mL/min/{1.73_m2} (ref >59)
Globulin, Total: 2.7 g/dL (ref 1.5–4.5)
Glucose: 96 mg/dL (ref 65–99)
POTASSIUM: 3.9 mmol/L (ref 3.5–5.2)
SODIUM: 143 mmol/L (ref 134–144)
TOTAL PROTEIN: 7.3 g/dL (ref 6.0–8.5)

## 2017-03-14 LAB — CBC WITH DIFFERENTIAL/PLATELET
BASOS: 0 %
Basophils Absolute: 0 10*3/uL (ref 0.0–0.2)
EOS (ABSOLUTE): 0.1 10*3/uL (ref 0.0–0.4)
Eos: 3 %
Hematocrit: 46.7 % (ref 37.5–51.0)
Hemoglobin: 16 g/dL (ref 13.0–17.7)
IMMATURE GRANS (ABS): 0 10*3/uL (ref 0.0–0.1)
Immature Granulocytes: 0 %
LYMPHS: 21 %
Lymphocytes Absolute: 1 10*3/uL (ref 0.7–3.1)
MCH: 33.3 pg — AB (ref 26.6–33.0)
MCHC: 34.3 g/dL (ref 31.5–35.7)
MCV: 97 fL (ref 79–97)
Monocytes Absolute: 0.4 10*3/uL (ref 0.1–0.9)
Monocytes: 8 %
NEUTROS ABS: 3.1 10*3/uL (ref 1.4–7.0)
Neutrophils: 68 %
PLATELETS: 139 10*3/uL — AB (ref 150–379)
RBC: 4.8 x10E6/uL (ref 4.14–5.80)
RDW: 16.1 % — ABNORMAL HIGH (ref 12.3–15.4)
WBC: 4.6 10*3/uL (ref 3.4–10.8)

## 2017-03-14 NOTE — Telephone Encounter (Signed)
I tried to call the patient regarding the blood work results, unable to get through to a person to leave a message. The patient lives in an adult home.  The blood work overall was unremarkable, mild elevation in sodium level, not likely to be clinically significant. The patient is running a slightly low platelet count that is stable over time, we will continue to follow.  No change in medical therapy.

## 2017-03-28 DIAGNOSIS — I82409 Acute embolism and thrombosis of unspecified deep veins of unspecified lower extremity: Secondary | ICD-10-CM | POA: Diagnosis not present

## 2017-03-28 DIAGNOSIS — G7000 Myasthenia gravis without (acute) exacerbation: Secondary | ICD-10-CM | POA: Diagnosis not present

## 2017-03-28 DIAGNOSIS — F0391 Unspecified dementia with behavioral disturbance: Secondary | ICD-10-CM | POA: Diagnosis not present

## 2017-03-28 DIAGNOSIS — K219 Gastro-esophageal reflux disease without esophagitis: Secondary | ICD-10-CM | POA: Diagnosis not present

## 2017-03-28 DIAGNOSIS — K429 Umbilical hernia without obstruction or gangrene: Secondary | ICD-10-CM | POA: Diagnosis not present

## 2017-04-15 DIAGNOSIS — Z79899 Other long term (current) drug therapy: Secondary | ICD-10-CM | POA: Diagnosis not present

## 2017-04-15 DIAGNOSIS — D649 Anemia, unspecified: Secondary | ICD-10-CM | POA: Diagnosis not present

## 2017-04-25 DIAGNOSIS — K219 Gastro-esophageal reflux disease without esophagitis: Secondary | ICD-10-CM | POA: Diagnosis not present

## 2017-04-25 DIAGNOSIS — I82409 Acute embolism and thrombosis of unspecified deep veins of unspecified lower extremity: Secondary | ICD-10-CM | POA: Diagnosis not present

## 2017-04-25 DIAGNOSIS — F0391 Unspecified dementia with behavioral disturbance: Secondary | ICD-10-CM | POA: Diagnosis not present

## 2017-04-25 DIAGNOSIS — G7000 Myasthenia gravis without (acute) exacerbation: Secondary | ICD-10-CM | POA: Diagnosis not present

## 2017-04-25 DIAGNOSIS — K429 Umbilical hernia without obstruction or gangrene: Secondary | ICD-10-CM | POA: Diagnosis not present

## 2017-05-06 DIAGNOSIS — I82402 Acute embolism and thrombosis of unspecified deep veins of left lower extremity: Secondary | ICD-10-CM | POA: Diagnosis not present

## 2017-05-10 DIAGNOSIS — Z23 Encounter for immunization: Secondary | ICD-10-CM | POA: Diagnosis not present

## 2017-05-23 DIAGNOSIS — I82409 Acute embolism and thrombosis of unspecified deep veins of unspecified lower extremity: Secondary | ICD-10-CM | POA: Diagnosis not present

## 2017-05-23 DIAGNOSIS — G7000 Myasthenia gravis without (acute) exacerbation: Secondary | ICD-10-CM | POA: Diagnosis not present

## 2017-05-23 DIAGNOSIS — K429 Umbilical hernia without obstruction or gangrene: Secondary | ICD-10-CM | POA: Diagnosis not present

## 2017-05-23 DIAGNOSIS — K219 Gastro-esophageal reflux disease without esophagitis: Secondary | ICD-10-CM | POA: Diagnosis not present

## 2017-05-23 DIAGNOSIS — F0391 Unspecified dementia with behavioral disturbance: Secondary | ICD-10-CM | POA: Diagnosis not present

## 2017-05-27 DIAGNOSIS — J9601 Acute respiratory failure with hypoxia: Secondary | ICD-10-CM | POA: Diagnosis not present

## 2017-06-16 ENCOUNTER — Telehealth: Payer: Self-pay | Admitting: Neurology

## 2017-06-16 DIAGNOSIS — Z5181 Encounter for therapeutic drug level monitoring: Secondary | ICD-10-CM

## 2017-06-16 NOTE — Telephone Encounter (Signed)
I tried to call the patient, he resides in an extended care facility, I was unsuccessful in talking to his nurse or to the patient.  The patient is on Imuran, he will need blood work done in the near future.  I will put the orders in, the patient is to come in and get the blood work done at his convenience.

## 2017-06-18 NOTE — Telephone Encounter (Signed)
Called [facility]. Spoke with [place]. She transferred me to hall where patient resides. Spoke with Dewayne HatchAnn. She had Man (nurse) pick up. I was unable to understand nurse on phone. I asked to speak with Dewayne HatchAnn again.  Advised pt needs to come in for repeat lab work. No appt needed. She relayed this to the nurse. Advised them to avoid 1130-1230pm d/t lab tech taking lunch at this time. Advised he needs to come this week for lab work. They verbalized understanding and will bring him in to have lab work.

## 2017-06-27 DIAGNOSIS — K429 Umbilical hernia without obstruction or gangrene: Secondary | ICD-10-CM | POA: Diagnosis not present

## 2017-06-27 DIAGNOSIS — K219 Gastro-esophageal reflux disease without esophagitis: Secondary | ICD-10-CM | POA: Diagnosis not present

## 2017-06-27 DIAGNOSIS — G7000 Myasthenia gravis without (acute) exacerbation: Secondary | ICD-10-CM | POA: Diagnosis not present

## 2017-06-27 DIAGNOSIS — F0391 Unspecified dementia with behavioral disturbance: Secondary | ICD-10-CM | POA: Diagnosis not present

## 2017-06-27 DIAGNOSIS — I82409 Acute embolism and thrombosis of unspecified deep veins of unspecified lower extremity: Secondary | ICD-10-CM | POA: Diagnosis not present

## 2017-07-25 DIAGNOSIS — F259 Schizoaffective disorder, unspecified: Secondary | ICD-10-CM | POA: Diagnosis not present

## 2017-07-25 DIAGNOSIS — F0391 Unspecified dementia with behavioral disturbance: Secondary | ICD-10-CM | POA: Diagnosis not present

## 2017-07-25 DIAGNOSIS — I82409 Acute embolism and thrombosis of unspecified deep veins of unspecified lower extremity: Secondary | ICD-10-CM | POA: Diagnosis not present

## 2017-07-25 DIAGNOSIS — K429 Umbilical hernia without obstruction or gangrene: Secondary | ICD-10-CM | POA: Diagnosis not present

## 2017-07-25 DIAGNOSIS — F411 Generalized anxiety disorder: Secondary | ICD-10-CM | POA: Diagnosis not present

## 2017-08-15 DIAGNOSIS — F411 Generalized anxiety disorder: Secondary | ICD-10-CM | POA: Diagnosis not present

## 2017-08-15 DIAGNOSIS — F259 Schizoaffective disorder, unspecified: Secondary | ICD-10-CM | POA: Diagnosis not present

## 2017-08-15 DIAGNOSIS — F22 Delusional disorders: Secondary | ICD-10-CM | POA: Diagnosis not present

## 2017-08-29 DIAGNOSIS — G7000 Myasthenia gravis without (acute) exacerbation: Secondary | ICD-10-CM | POA: Diagnosis not present

## 2017-08-29 DIAGNOSIS — F0391 Unspecified dementia with behavioral disturbance: Secondary | ICD-10-CM | POA: Diagnosis not present

## 2017-08-29 DIAGNOSIS — K429 Umbilical hernia without obstruction or gangrene: Secondary | ICD-10-CM | POA: Diagnosis not present

## 2017-08-29 DIAGNOSIS — K219 Gastro-esophageal reflux disease without esophagitis: Secondary | ICD-10-CM | POA: Diagnosis not present

## 2017-08-29 DIAGNOSIS — I82409 Acute embolism and thrombosis of unspecified deep veins of unspecified lower extremity: Secondary | ICD-10-CM | POA: Diagnosis not present

## 2017-09-13 ENCOUNTER — Ambulatory Visit: Payer: Medicare Other | Admitting: Neurology

## 2017-10-03 DIAGNOSIS — D649 Anemia, unspecified: Secondary | ICD-10-CM | POA: Diagnosis not present

## 2017-10-03 DIAGNOSIS — I82409 Acute embolism and thrombosis of unspecified deep veins of unspecified lower extremity: Secondary | ICD-10-CM | POA: Diagnosis not present

## 2017-10-03 DIAGNOSIS — G7000 Myasthenia gravis without (acute) exacerbation: Secondary | ICD-10-CM | POA: Diagnosis not present

## 2017-10-03 DIAGNOSIS — K219 Gastro-esophageal reflux disease without esophagitis: Secondary | ICD-10-CM | POA: Diagnosis not present

## 2017-10-03 DIAGNOSIS — F0391 Unspecified dementia with behavioral disturbance: Secondary | ICD-10-CM | POA: Diagnosis not present

## 2017-10-03 DIAGNOSIS — K429 Umbilical hernia without obstruction or gangrene: Secondary | ICD-10-CM | POA: Diagnosis not present

## 2017-10-10 DIAGNOSIS — F22 Delusional disorders: Secondary | ICD-10-CM | POA: Diagnosis not present

## 2017-10-10 DIAGNOSIS — F411 Generalized anxiety disorder: Secondary | ICD-10-CM | POA: Diagnosis not present

## 2017-10-10 DIAGNOSIS — F259 Schizoaffective disorder, unspecified: Secondary | ICD-10-CM | POA: Diagnosis not present

## 2017-10-24 DIAGNOSIS — K429 Umbilical hernia without obstruction or gangrene: Secondary | ICD-10-CM | POA: Diagnosis not present

## 2017-10-24 DIAGNOSIS — G7000 Myasthenia gravis without (acute) exacerbation: Secondary | ICD-10-CM | POA: Diagnosis not present

## 2017-10-24 DIAGNOSIS — M1990 Unspecified osteoarthritis, unspecified site: Secondary | ICD-10-CM | POA: Diagnosis not present

## 2017-10-24 DIAGNOSIS — K219 Gastro-esophageal reflux disease without esophagitis: Secondary | ICD-10-CM | POA: Diagnosis not present

## 2017-10-24 DIAGNOSIS — F0390 Unspecified dementia without behavioral disturbance: Secondary | ICD-10-CM | POA: Diagnosis not present

## 2017-10-24 DIAGNOSIS — I82409 Acute embolism and thrombosis of unspecified deep veins of unspecified lower extremity: Secondary | ICD-10-CM | POA: Diagnosis not present

## 2017-11-13 DIAGNOSIS — F22 Delusional disorders: Secondary | ICD-10-CM | POA: Diagnosis not present

## 2017-11-13 DIAGNOSIS — F259 Schizoaffective disorder, unspecified: Secondary | ICD-10-CM | POA: Diagnosis not present

## 2017-11-28 DIAGNOSIS — K429 Umbilical hernia without obstruction or gangrene: Secondary | ICD-10-CM | POA: Diagnosis not present

## 2017-11-28 DIAGNOSIS — G7000 Myasthenia gravis without (acute) exacerbation: Secondary | ICD-10-CM | POA: Diagnosis not present

## 2017-11-28 DIAGNOSIS — F0391 Unspecified dementia with behavioral disturbance: Secondary | ICD-10-CM | POA: Diagnosis not present

## 2017-11-28 DIAGNOSIS — K219 Gastro-esophageal reflux disease without esophagitis: Secondary | ICD-10-CM | POA: Diagnosis not present

## 2017-11-28 DIAGNOSIS — I82409 Acute embolism and thrombosis of unspecified deep veins of unspecified lower extremity: Secondary | ICD-10-CM | POA: Diagnosis not present

## 2017-12-09 DIAGNOSIS — E119 Type 2 diabetes mellitus without complications: Secondary | ICD-10-CM | POA: Diagnosis not present

## 2017-12-09 DIAGNOSIS — D689 Coagulation defect, unspecified: Secondary | ICD-10-CM | POA: Diagnosis not present

## 2017-12-19 DIAGNOSIS — F22 Delusional disorders: Secondary | ICD-10-CM | POA: Diagnosis not present

## 2017-12-19 DIAGNOSIS — F411 Generalized anxiety disorder: Secondary | ICD-10-CM | POA: Diagnosis not present

## 2017-12-19 DIAGNOSIS — F259 Schizoaffective disorder, unspecified: Secondary | ICD-10-CM | POA: Diagnosis not present

## 2018-01-02 DIAGNOSIS — K219 Gastro-esophageal reflux disease without esophagitis: Secondary | ICD-10-CM | POA: Diagnosis not present

## 2018-01-02 DIAGNOSIS — G7000 Myasthenia gravis without (acute) exacerbation: Secondary | ICD-10-CM | POA: Diagnosis not present

## 2018-01-02 DIAGNOSIS — I82409 Acute embolism and thrombosis of unspecified deep veins of unspecified lower extremity: Secondary | ICD-10-CM | POA: Diagnosis not present

## 2018-01-02 DIAGNOSIS — K429 Umbilical hernia without obstruction or gangrene: Secondary | ICD-10-CM | POA: Diagnosis not present

## 2018-01-02 DIAGNOSIS — F0391 Unspecified dementia with behavioral disturbance: Secondary | ICD-10-CM | POA: Diagnosis not present

## 2018-01-22 DIAGNOSIS — F259 Schizoaffective disorder, unspecified: Secondary | ICD-10-CM | POA: Diagnosis not present

## 2018-01-22 DIAGNOSIS — F411 Generalized anxiety disorder: Secondary | ICD-10-CM | POA: Diagnosis not present

## 2018-01-22 DIAGNOSIS — F22 Delusional disorders: Secondary | ICD-10-CM | POA: Diagnosis not present

## 2018-01-29 DIAGNOSIS — G7000 Myasthenia gravis without (acute) exacerbation: Secondary | ICD-10-CM | POA: Diagnosis not present

## 2018-01-30 DIAGNOSIS — G7000 Myasthenia gravis without (acute) exacerbation: Secondary | ICD-10-CM | POA: Diagnosis not present

## 2018-01-30 DIAGNOSIS — K219 Gastro-esophageal reflux disease without esophagitis: Secondary | ICD-10-CM | POA: Diagnosis not present

## 2018-01-30 DIAGNOSIS — K429 Umbilical hernia without obstruction or gangrene: Secondary | ICD-10-CM | POA: Diagnosis not present

## 2018-01-30 DIAGNOSIS — I82409 Acute embolism and thrombosis of unspecified deep veins of unspecified lower extremity: Secondary | ICD-10-CM | POA: Diagnosis not present

## 2018-01-30 DIAGNOSIS — F0390 Unspecified dementia without behavioral disturbance: Secondary | ICD-10-CM | POA: Diagnosis not present

## 2018-01-30 DIAGNOSIS — F39 Unspecified mood [affective] disorder: Secondary | ICD-10-CM | POA: Diagnosis not present

## 2018-02-05 DIAGNOSIS — F259 Schizoaffective disorder, unspecified: Secondary | ICD-10-CM | POA: Diagnosis not present

## 2018-02-05 DIAGNOSIS — F411 Generalized anxiety disorder: Secondary | ICD-10-CM | POA: Diagnosis not present

## 2018-02-05 DIAGNOSIS — F22 Delusional disorders: Secondary | ICD-10-CM | POA: Diagnosis not present

## 2018-02-19 DIAGNOSIS — F259 Schizoaffective disorder, unspecified: Secondary | ICD-10-CM | POA: Diagnosis not present

## 2018-02-19 DIAGNOSIS — G4700 Insomnia, unspecified: Secondary | ICD-10-CM | POA: Diagnosis not present

## 2018-02-27 DIAGNOSIS — K219 Gastro-esophageal reflux disease without esophagitis: Secondary | ICD-10-CM | POA: Diagnosis not present

## 2018-02-27 DIAGNOSIS — L859 Epidermal thickening, unspecified: Secondary | ICD-10-CM | POA: Diagnosis not present

## 2018-02-27 DIAGNOSIS — K429 Umbilical hernia without obstruction or gangrene: Secondary | ICD-10-CM | POA: Diagnosis not present

## 2018-02-27 DIAGNOSIS — F0391 Unspecified dementia with behavioral disturbance: Secondary | ICD-10-CM | POA: Diagnosis not present

## 2018-02-27 DIAGNOSIS — G7000 Myasthenia gravis without (acute) exacerbation: Secondary | ICD-10-CM | POA: Diagnosis not present

## 2018-02-27 DIAGNOSIS — I82409 Acute embolism and thrombosis of unspecified deep veins of unspecified lower extremity: Secondary | ICD-10-CM | POA: Diagnosis not present

## 2018-03-05 DIAGNOSIS — G4700 Insomnia, unspecified: Secondary | ICD-10-CM | POA: Diagnosis not present

## 2018-03-05 DIAGNOSIS — F411 Generalized anxiety disorder: Secondary | ICD-10-CM | POA: Diagnosis not present

## 2018-03-05 DIAGNOSIS — F259 Schizoaffective disorder, unspecified: Secondary | ICD-10-CM | POA: Diagnosis not present

## 2018-03-19 DIAGNOSIS — Z23 Encounter for immunization: Secondary | ICD-10-CM | POA: Diagnosis not present

## 2018-04-10 DIAGNOSIS — K219 Gastro-esophageal reflux disease without esophagitis: Secondary | ICD-10-CM | POA: Diagnosis not present

## 2018-04-10 DIAGNOSIS — I82409 Acute embolism and thrombosis of unspecified deep veins of unspecified lower extremity: Secondary | ICD-10-CM | POA: Diagnosis not present

## 2018-04-10 DIAGNOSIS — F0390 Unspecified dementia without behavioral disturbance: Secondary | ICD-10-CM | POA: Diagnosis not present

## 2018-04-10 DIAGNOSIS — G7000 Myasthenia gravis without (acute) exacerbation: Secondary | ICD-10-CM | POA: Diagnosis not present

## 2018-04-10 DIAGNOSIS — K429 Umbilical hernia without obstruction or gangrene: Secondary | ICD-10-CM | POA: Diagnosis not present

## 2018-04-16 DIAGNOSIS — G4700 Insomnia, unspecified: Secondary | ICD-10-CM | POA: Diagnosis not present

## 2018-04-16 DIAGNOSIS — F411 Generalized anxiety disorder: Secondary | ICD-10-CM | POA: Diagnosis not present

## 2018-04-16 DIAGNOSIS — F259 Schizoaffective disorder, unspecified: Secondary | ICD-10-CM | POA: Diagnosis not present

## 2018-05-01 DIAGNOSIS — K429 Umbilical hernia without obstruction or gangrene: Secondary | ICD-10-CM | POA: Diagnosis not present

## 2018-05-01 DIAGNOSIS — K219 Gastro-esophageal reflux disease without esophagitis: Secondary | ICD-10-CM | POA: Diagnosis not present

## 2018-05-01 DIAGNOSIS — G7000 Myasthenia gravis without (acute) exacerbation: Secondary | ICD-10-CM | POA: Diagnosis not present

## 2018-05-01 DIAGNOSIS — F0390 Unspecified dementia without behavioral disturbance: Secondary | ICD-10-CM | POA: Diagnosis not present

## 2018-05-01 DIAGNOSIS — I82409 Acute embolism and thrombosis of unspecified deep veins of unspecified lower extremity: Secondary | ICD-10-CM | POA: Diagnosis not present

## 2018-05-28 DIAGNOSIS — G4700 Insomnia, unspecified: Secondary | ICD-10-CM | POA: Diagnosis not present

## 2018-05-28 DIAGNOSIS — F411 Generalized anxiety disorder: Secondary | ICD-10-CM | POA: Diagnosis not present

## 2018-05-28 DIAGNOSIS — F259 Schizoaffective disorder, unspecified: Secondary | ICD-10-CM | POA: Diagnosis not present

## 2018-05-29 DIAGNOSIS — G7000 Myasthenia gravis without (acute) exacerbation: Secondary | ICD-10-CM | POA: Diagnosis not present

## 2018-05-29 DIAGNOSIS — I82409 Acute embolism and thrombosis of unspecified deep veins of unspecified lower extremity: Secondary | ICD-10-CM | POA: Diagnosis not present

## 2018-05-29 DIAGNOSIS — K429 Umbilical hernia without obstruction or gangrene: Secondary | ICD-10-CM | POA: Diagnosis not present

## 2018-05-29 DIAGNOSIS — F0390 Unspecified dementia without behavioral disturbance: Secondary | ICD-10-CM | POA: Diagnosis not present

## 2018-06-06 DIAGNOSIS — E119 Type 2 diabetes mellitus without complications: Secondary | ICD-10-CM | POA: Diagnosis not present

## 2018-06-06 DIAGNOSIS — D689 Coagulation defect, unspecified: Secondary | ICD-10-CM | POA: Diagnosis not present

## 2018-06-12 DIAGNOSIS — F259 Schizoaffective disorder, unspecified: Secondary | ICD-10-CM | POA: Diagnosis not present

## 2018-06-12 DIAGNOSIS — G4700 Insomnia, unspecified: Secondary | ICD-10-CM | POA: Diagnosis not present

## 2018-06-12 DIAGNOSIS — F411 Generalized anxiety disorder: Secondary | ICD-10-CM | POA: Diagnosis not present

## 2018-07-03 DIAGNOSIS — I82409 Acute embolism and thrombosis of unspecified deep veins of unspecified lower extremity: Secondary | ICD-10-CM | POA: Diagnosis not present

## 2018-07-03 DIAGNOSIS — M1990 Unspecified osteoarthritis, unspecified site: Secondary | ICD-10-CM | POA: Diagnosis not present

## 2018-07-03 DIAGNOSIS — F0390 Unspecified dementia without behavioral disturbance: Secondary | ICD-10-CM | POA: Diagnosis not present

## 2018-07-03 DIAGNOSIS — K219 Gastro-esophageal reflux disease without esophagitis: Secondary | ICD-10-CM | POA: Diagnosis not present

## 2018-07-03 DIAGNOSIS — K429 Umbilical hernia without obstruction or gangrene: Secondary | ICD-10-CM | POA: Diagnosis not present

## 2018-07-03 DIAGNOSIS — G7000 Myasthenia gravis without (acute) exacerbation: Secondary | ICD-10-CM | POA: Diagnosis not present

## 2018-08-28 DIAGNOSIS — G4700 Insomnia, unspecified: Secondary | ICD-10-CM | POA: Diagnosis not present

## 2018-08-28 DIAGNOSIS — K219 Gastro-esophageal reflux disease without esophagitis: Secondary | ICD-10-CM | POA: Diagnosis not present

## 2018-08-28 DIAGNOSIS — F259 Schizoaffective disorder, unspecified: Secondary | ICD-10-CM | POA: Diagnosis not present

## 2018-08-28 DIAGNOSIS — I82409 Acute embolism and thrombosis of unspecified deep veins of unspecified lower extremity: Secondary | ICD-10-CM | POA: Diagnosis not present

## 2018-08-28 DIAGNOSIS — F411 Generalized anxiety disorder: Secondary | ICD-10-CM | POA: Diagnosis not present

## 2018-08-28 DIAGNOSIS — K429 Umbilical hernia without obstruction or gangrene: Secondary | ICD-10-CM | POA: Diagnosis not present

## 2018-08-28 DIAGNOSIS — F0390 Unspecified dementia without behavioral disturbance: Secondary | ICD-10-CM | POA: Diagnosis not present

## 2018-08-28 DIAGNOSIS — G7000 Myasthenia gravis without (acute) exacerbation: Secondary | ICD-10-CM | POA: Diagnosis not present

## 2018-10-02 DIAGNOSIS — G7000 Myasthenia gravis without (acute) exacerbation: Secondary | ICD-10-CM | POA: Diagnosis not present

## 2018-10-02 DIAGNOSIS — K429 Umbilical hernia without obstruction or gangrene: Secondary | ICD-10-CM | POA: Diagnosis not present

## 2018-10-02 DIAGNOSIS — K219 Gastro-esophageal reflux disease without esophagitis: Secondary | ICD-10-CM | POA: Diagnosis not present

## 2018-10-02 DIAGNOSIS — I82409 Acute embolism and thrombosis of unspecified deep veins of unspecified lower extremity: Secondary | ICD-10-CM | POA: Diagnosis not present

## 2018-10-02 DIAGNOSIS — F0390 Unspecified dementia without behavioral disturbance: Secondary | ICD-10-CM | POA: Diagnosis not present

## 2018-10-20 DIAGNOSIS — D689 Coagulation defect, unspecified: Secondary | ICD-10-CM | POA: Diagnosis not present

## 2018-10-20 DIAGNOSIS — E119 Type 2 diabetes mellitus without complications: Secondary | ICD-10-CM | POA: Diagnosis not present

## 2018-10-23 DIAGNOSIS — Z86718 Personal history of other venous thrombosis and embolism: Secondary | ICD-10-CM | POA: Diagnosis not present

## 2018-10-23 DIAGNOSIS — F330 Major depressive disorder, recurrent, mild: Secondary | ICD-10-CM | POA: Diagnosis not present

## 2018-10-23 DIAGNOSIS — R41841 Cognitive communication deficit: Secondary | ICD-10-CM | POA: Diagnosis not present

## 2018-10-23 DIAGNOSIS — G7001 Myasthenia gravis with (acute) exacerbation: Secondary | ICD-10-CM | POA: Diagnosis not present

## 2018-10-30 DIAGNOSIS — G7000 Myasthenia gravis without (acute) exacerbation: Secondary | ICD-10-CM | POA: Diagnosis not present

## 2018-10-30 DIAGNOSIS — K429 Umbilical hernia without obstruction or gangrene: Secondary | ICD-10-CM | POA: Diagnosis not present

## 2018-10-30 DIAGNOSIS — F0390 Unspecified dementia without behavioral disturbance: Secondary | ICD-10-CM | POA: Diagnosis not present

## 2018-10-30 DIAGNOSIS — I82409 Acute embolism and thrombosis of unspecified deep veins of unspecified lower extremity: Secondary | ICD-10-CM | POA: Diagnosis not present

## 2018-10-30 DIAGNOSIS — K219 Gastro-esophageal reflux disease without esophagitis: Secondary | ICD-10-CM | POA: Diagnosis not present

## 2018-11-26 DIAGNOSIS — Z20828 Contact with and (suspected) exposure to other viral communicable diseases: Secondary | ICD-10-CM | POA: Diagnosis not present

## 2018-11-26 DIAGNOSIS — Z1383 Encounter for screening for respiratory disorder NEC: Secondary | ICD-10-CM | POA: Diagnosis not present

## 2018-11-28 DIAGNOSIS — K219 Gastro-esophageal reflux disease without esophagitis: Secondary | ICD-10-CM | POA: Diagnosis not present

## 2018-11-28 DIAGNOSIS — I82409 Acute embolism and thrombosis of unspecified deep veins of unspecified lower extremity: Secondary | ICD-10-CM | POA: Diagnosis not present

## 2018-11-28 DIAGNOSIS — K429 Umbilical hernia without obstruction or gangrene: Secondary | ICD-10-CM | POA: Diagnosis not present

## 2018-11-28 DIAGNOSIS — G7000 Myasthenia gravis without (acute) exacerbation: Secondary | ICD-10-CM | POA: Diagnosis not present

## 2018-11-28 DIAGNOSIS — F0390 Unspecified dementia without behavioral disturbance: Secondary | ICD-10-CM | POA: Diagnosis not present

## 2018-12-01 DIAGNOSIS — Z1383 Encounter for screening for respiratory disorder NEC: Secondary | ICD-10-CM | POA: Diagnosis not present

## 2018-12-01 DIAGNOSIS — Z20828 Contact with and (suspected) exposure to other viral communicable diseases: Secondary | ICD-10-CM | POA: Diagnosis not present

## 2018-12-26 DIAGNOSIS — K429 Umbilical hernia without obstruction or gangrene: Secondary | ICD-10-CM | POA: Diagnosis not present

## 2018-12-26 DIAGNOSIS — F0390 Unspecified dementia without behavioral disturbance: Secondary | ICD-10-CM | POA: Diagnosis not present

## 2018-12-26 DIAGNOSIS — I82409 Acute embolism and thrombosis of unspecified deep veins of unspecified lower extremity: Secondary | ICD-10-CM | POA: Diagnosis not present

## 2018-12-26 DIAGNOSIS — K219 Gastro-esophageal reflux disease without esophagitis: Secondary | ICD-10-CM | POA: Diagnosis not present

## 2018-12-26 DIAGNOSIS — G7000 Myasthenia gravis without (acute) exacerbation: Secondary | ICD-10-CM | POA: Diagnosis not present

## 2019-01-02 DIAGNOSIS — F411 Generalized anxiety disorder: Secondary | ICD-10-CM | POA: Diagnosis not present

## 2019-01-02 DIAGNOSIS — G4700 Insomnia, unspecified: Secondary | ICD-10-CM | POA: Diagnosis not present

## 2019-01-02 DIAGNOSIS — F259 Schizoaffective disorder, unspecified: Secondary | ICD-10-CM | POA: Diagnosis not present

## 2019-01-16 DIAGNOSIS — I739 Peripheral vascular disease, unspecified: Secondary | ICD-10-CM | POA: Diagnosis not present

## 2019-01-16 DIAGNOSIS — L603 Nail dystrophy: Secondary | ICD-10-CM | POA: Diagnosis not present

## 2019-01-16 DIAGNOSIS — B351 Tinea unguium: Secondary | ICD-10-CM | POA: Diagnosis not present

## 2019-01-29 DIAGNOSIS — K219 Gastro-esophageal reflux disease without esophagitis: Secondary | ICD-10-CM | POA: Diagnosis not present

## 2019-01-29 DIAGNOSIS — G7000 Myasthenia gravis without (acute) exacerbation: Secondary | ICD-10-CM | POA: Diagnosis not present

## 2019-01-29 DIAGNOSIS — I82409 Acute embolism and thrombosis of unspecified deep veins of unspecified lower extremity: Secondary | ICD-10-CM | POA: Diagnosis not present

## 2019-01-29 DIAGNOSIS — K429 Umbilical hernia without obstruction or gangrene: Secondary | ICD-10-CM | POA: Diagnosis not present

## 2019-01-29 DIAGNOSIS — F0390 Unspecified dementia without behavioral disturbance: Secondary | ICD-10-CM | POA: Diagnosis not present

## 2019-02-26 DIAGNOSIS — F0390 Unspecified dementia without behavioral disturbance: Secondary | ICD-10-CM | POA: Diagnosis not present

## 2019-02-26 DIAGNOSIS — K219 Gastro-esophageal reflux disease without esophagitis: Secondary | ICD-10-CM | POA: Diagnosis not present

## 2019-02-26 DIAGNOSIS — G7000 Myasthenia gravis without (acute) exacerbation: Secondary | ICD-10-CM | POA: Diagnosis not present

## 2019-02-26 DIAGNOSIS — K429 Umbilical hernia without obstruction or gangrene: Secondary | ICD-10-CM | POA: Diagnosis not present

## 2019-02-26 DIAGNOSIS — I82409 Acute embolism and thrombosis of unspecified deep veins of unspecified lower extremity: Secondary | ICD-10-CM | POA: Diagnosis not present

## 2019-03-04 DIAGNOSIS — F259 Schizoaffective disorder, unspecified: Secondary | ICD-10-CM | POA: Diagnosis not present

## 2019-03-04 DIAGNOSIS — G4700 Insomnia, unspecified: Secondary | ICD-10-CM | POA: Diagnosis not present

## 2019-03-17 DIAGNOSIS — Z23 Encounter for immunization: Secondary | ICD-10-CM | POA: Diagnosis not present

## 2019-03-19 DIAGNOSIS — B351 Tinea unguium: Secondary | ICD-10-CM | POA: Diagnosis not present

## 2019-03-19 DIAGNOSIS — I739 Peripheral vascular disease, unspecified: Secondary | ICD-10-CM | POA: Diagnosis not present

## 2019-03-19 DIAGNOSIS — L603 Nail dystrophy: Secondary | ICD-10-CM | POA: Diagnosis not present

## 2019-03-24 DIAGNOSIS — Z1383 Encounter for screening for respiratory disorder NEC: Secondary | ICD-10-CM | POA: Diagnosis not present

## 2019-03-24 DIAGNOSIS — Z20828 Contact with and (suspected) exposure to other viral communicable diseases: Secondary | ICD-10-CM | POA: Diagnosis not present

## 2019-03-26 DIAGNOSIS — I82409 Acute embolism and thrombosis of unspecified deep veins of unspecified lower extremity: Secondary | ICD-10-CM | POA: Diagnosis not present

## 2019-03-26 DIAGNOSIS — K219 Gastro-esophageal reflux disease without esophagitis: Secondary | ICD-10-CM | POA: Diagnosis not present

## 2019-03-26 DIAGNOSIS — G7000 Myasthenia gravis without (acute) exacerbation: Secondary | ICD-10-CM | POA: Diagnosis not present

## 2019-03-26 DIAGNOSIS — K429 Umbilical hernia without obstruction or gangrene: Secondary | ICD-10-CM | POA: Diagnosis not present

## 2019-03-26 DIAGNOSIS — F0390 Unspecified dementia without behavioral disturbance: Secondary | ICD-10-CM | POA: Diagnosis not present

## 2019-04-21 DIAGNOSIS — Z20828 Contact with and (suspected) exposure to other viral communicable diseases: Secondary | ICD-10-CM | POA: Diagnosis not present

## 2019-04-23 DIAGNOSIS — K219 Gastro-esophageal reflux disease without esophagitis: Secondary | ICD-10-CM | POA: Diagnosis not present

## 2019-04-23 DIAGNOSIS — I82409 Acute embolism and thrombosis of unspecified deep veins of unspecified lower extremity: Secondary | ICD-10-CM | POA: Diagnosis not present

## 2019-04-23 DIAGNOSIS — G7000 Myasthenia gravis without (acute) exacerbation: Secondary | ICD-10-CM | POA: Diagnosis not present

## 2019-04-23 DIAGNOSIS — F0390 Unspecified dementia without behavioral disturbance: Secondary | ICD-10-CM | POA: Diagnosis not present

## 2019-04-27 DIAGNOSIS — Z1383 Encounter for screening for respiratory disorder NEC: Secondary | ICD-10-CM | POA: Diagnosis not present

## 2019-04-27 DIAGNOSIS — Z20828 Contact with and (suspected) exposure to other viral communicable diseases: Secondary | ICD-10-CM | POA: Diagnosis not present

## 2019-04-29 DIAGNOSIS — G4700 Insomnia, unspecified: Secondary | ICD-10-CM | POA: Diagnosis not present

## 2019-04-29 DIAGNOSIS — F259 Schizoaffective disorder, unspecified: Secondary | ICD-10-CM | POA: Diagnosis not present

## 2019-05-04 DIAGNOSIS — Z1383 Encounter for screening for respiratory disorder NEC: Secondary | ICD-10-CM | POA: Diagnosis not present

## 2019-05-04 DIAGNOSIS — Z20828 Contact with and (suspected) exposure to other viral communicable diseases: Secondary | ICD-10-CM | POA: Diagnosis not present

## 2019-05-17 DIAGNOSIS — Z20828 Contact with and (suspected) exposure to other viral communicable diseases: Secondary | ICD-10-CM | POA: Diagnosis not present

## 2019-05-17 DIAGNOSIS — Z1383 Encounter for screening for respiratory disorder NEC: Secondary | ICD-10-CM | POA: Diagnosis not present

## 2019-05-21 DIAGNOSIS — F0390 Unspecified dementia without behavioral disturbance: Secondary | ICD-10-CM | POA: Diagnosis not present

## 2019-05-21 DIAGNOSIS — I82409 Acute embolism and thrombosis of unspecified deep veins of unspecified lower extremity: Secondary | ICD-10-CM | POA: Diagnosis not present

## 2019-05-21 DIAGNOSIS — G7000 Myasthenia gravis without (acute) exacerbation: Secondary | ICD-10-CM | POA: Diagnosis not present

## 2019-05-21 DIAGNOSIS — K219 Gastro-esophageal reflux disease without esophagitis: Secondary | ICD-10-CM | POA: Diagnosis not present

## 2019-06-08 DIAGNOSIS — E119 Type 2 diabetes mellitus without complications: Secondary | ICD-10-CM | POA: Diagnosis not present

## 2019-06-08 DIAGNOSIS — D689 Coagulation defect, unspecified: Secondary | ICD-10-CM | POA: Diagnosis not present

## 2019-06-17 DIAGNOSIS — Z1383 Encounter for screening for respiratory disorder NEC: Secondary | ICD-10-CM | POA: Diagnosis not present

## 2019-06-17 DIAGNOSIS — Z20828 Contact with and (suspected) exposure to other viral communicable diseases: Secondary | ICD-10-CM | POA: Diagnosis not present

## 2019-06-21 DIAGNOSIS — Z20828 Contact with and (suspected) exposure to other viral communicable diseases: Secondary | ICD-10-CM | POA: Diagnosis not present

## 2019-06-21 DIAGNOSIS — Z1383 Encounter for screening for respiratory disorder NEC: Secondary | ICD-10-CM | POA: Diagnosis not present

## 2019-06-22 DIAGNOSIS — Z23 Encounter for immunization: Secondary | ICD-10-CM | POA: Diagnosis not present

## 2019-06-25 DIAGNOSIS — F0390 Unspecified dementia without behavioral disturbance: Secondary | ICD-10-CM | POA: Diagnosis not present

## 2019-06-25 DIAGNOSIS — I82409 Acute embolism and thrombosis of unspecified deep veins of unspecified lower extremity: Secondary | ICD-10-CM | POA: Diagnosis not present

## 2019-06-25 DIAGNOSIS — K429 Umbilical hernia without obstruction or gangrene: Secondary | ICD-10-CM | POA: Diagnosis not present

## 2019-06-25 DIAGNOSIS — K219 Gastro-esophageal reflux disease without esophagitis: Secondary | ICD-10-CM | POA: Diagnosis not present

## 2019-06-25 DIAGNOSIS — G7000 Myasthenia gravis without (acute) exacerbation: Secondary | ICD-10-CM | POA: Diagnosis not present

## 2019-06-28 DIAGNOSIS — Z20828 Contact with and (suspected) exposure to other viral communicable diseases: Secondary | ICD-10-CM | POA: Diagnosis not present

## 2019-07-05 DIAGNOSIS — Z20828 Contact with and (suspected) exposure to other viral communicable diseases: Secondary | ICD-10-CM | POA: Diagnosis not present

## 2019-07-05 DIAGNOSIS — Z1383 Encounter for screening for respiratory disorder NEC: Secondary | ICD-10-CM | POA: Diagnosis not present

## 2019-07-08 DIAGNOSIS — D689 Coagulation defect, unspecified: Secondary | ICD-10-CM | POA: Diagnosis not present

## 2019-07-08 DIAGNOSIS — E119 Type 2 diabetes mellitus without complications: Secondary | ICD-10-CM | POA: Diagnosis not present

## 2019-07-08 DIAGNOSIS — Z79899 Other long term (current) drug therapy: Secondary | ICD-10-CM | POA: Diagnosis not present

## 2019-07-12 DIAGNOSIS — Z1383 Encounter for screening for respiratory disorder NEC: Secondary | ICD-10-CM | POA: Diagnosis not present

## 2019-07-12 DIAGNOSIS — Z20828 Contact with and (suspected) exposure to other viral communicable diseases: Secondary | ICD-10-CM | POA: Diagnosis not present

## 2019-07-15 DIAGNOSIS — F258 Other schizoaffective disorders: Secondary | ICD-10-CM | POA: Diagnosis not present

## 2019-07-20 DIAGNOSIS — I82402 Acute embolism and thrombosis of unspecified deep veins of left lower extremity: Secondary | ICD-10-CM | POA: Diagnosis not present

## 2019-07-20 DIAGNOSIS — D689 Coagulation defect, unspecified: Secondary | ICD-10-CM | POA: Diagnosis not present

## 2019-07-20 DIAGNOSIS — Z23 Encounter for immunization: Secondary | ICD-10-CM | POA: Diagnosis not present

## 2019-07-20 DIAGNOSIS — I82403 Acute embolism and thrombosis of unspecified deep veins of lower extremity, bilateral: Secondary | ICD-10-CM | POA: Diagnosis not present

## 2019-07-20 DIAGNOSIS — E119 Type 2 diabetes mellitus without complications: Secondary | ICD-10-CM | POA: Diagnosis not present

## 2019-07-22 DIAGNOSIS — F258 Other schizoaffective disorders: Secondary | ICD-10-CM | POA: Diagnosis not present

## 2019-07-22 DIAGNOSIS — Z20828 Contact with and (suspected) exposure to other viral communicable diseases: Secondary | ICD-10-CM | POA: Diagnosis not present

## 2019-07-23 DIAGNOSIS — K219 Gastro-esophageal reflux disease without esophagitis: Secondary | ICD-10-CM | POA: Diagnosis not present

## 2019-07-23 DIAGNOSIS — F0390 Unspecified dementia without behavioral disturbance: Secondary | ICD-10-CM | POA: Diagnosis not present

## 2019-07-23 DIAGNOSIS — I82409 Acute embolism and thrombosis of unspecified deep veins of unspecified lower extremity: Secondary | ICD-10-CM | POA: Diagnosis not present

## 2019-07-23 DIAGNOSIS — G7000 Myasthenia gravis without (acute) exacerbation: Secondary | ICD-10-CM | POA: Diagnosis not present

## 2019-07-27 DIAGNOSIS — Z20828 Contact with and (suspected) exposure to other viral communicable diseases: Secondary | ICD-10-CM | POA: Diagnosis not present

## 2019-08-03 DIAGNOSIS — Z1383 Encounter for screening for respiratory disorder NEC: Secondary | ICD-10-CM | POA: Diagnosis not present

## 2019-08-03 DIAGNOSIS — Z20828 Contact with and (suspected) exposure to other viral communicable diseases: Secondary | ICD-10-CM | POA: Diagnosis not present

## 2019-08-07 DIAGNOSIS — D649 Anemia, unspecified: Secondary | ICD-10-CM | POA: Diagnosis not present

## 2019-08-07 DIAGNOSIS — Z79899 Other long term (current) drug therapy: Secondary | ICD-10-CM | POA: Diagnosis not present

## 2019-08-07 DIAGNOSIS — E119 Type 2 diabetes mellitus without complications: Secondary | ICD-10-CM | POA: Diagnosis not present

## 2019-08-07 DIAGNOSIS — I82402 Acute embolism and thrombosis of unspecified deep veins of left lower extremity: Secondary | ICD-10-CM | POA: Diagnosis not present

## 2019-08-10 DIAGNOSIS — Z20828 Contact with and (suspected) exposure to other viral communicable diseases: Secondary | ICD-10-CM | POA: Diagnosis not present

## 2019-08-10 DIAGNOSIS — Z1383 Encounter for screening for respiratory disorder NEC: Secondary | ICD-10-CM | POA: Diagnosis not present

## 2019-08-18 DIAGNOSIS — Z1383 Encounter for screening for respiratory disorder NEC: Secondary | ICD-10-CM | POA: Diagnosis not present

## 2019-08-18 DIAGNOSIS — Z20828 Contact with and (suspected) exposure to other viral communicable diseases: Secondary | ICD-10-CM | POA: Diagnosis not present

## 2019-08-20 DIAGNOSIS — I82409 Acute embolism and thrombosis of unspecified deep veins of unspecified lower extremity: Secondary | ICD-10-CM | POA: Diagnosis not present

## 2019-08-20 DIAGNOSIS — F0390 Unspecified dementia without behavioral disturbance: Secondary | ICD-10-CM | POA: Diagnosis not present

## 2019-08-20 DIAGNOSIS — G7000 Myasthenia gravis without (acute) exacerbation: Secondary | ICD-10-CM | POA: Diagnosis not present

## 2019-08-20 DIAGNOSIS — K219 Gastro-esophageal reflux disease without esophagitis: Secondary | ICD-10-CM | POA: Diagnosis not present

## 2019-08-26 DIAGNOSIS — Z20828 Contact with and (suspected) exposure to other viral communicable diseases: Secondary | ICD-10-CM | POA: Diagnosis not present

## 2019-08-26 DIAGNOSIS — Z1383 Encounter for screening for respiratory disorder NEC: Secondary | ICD-10-CM | POA: Diagnosis not present

## 2019-09-04 DIAGNOSIS — Z79899 Other long term (current) drug therapy: Secondary | ICD-10-CM | POA: Diagnosis not present

## 2019-09-04 DIAGNOSIS — D689 Coagulation defect, unspecified: Secondary | ICD-10-CM | POA: Diagnosis not present

## 2019-09-04 DIAGNOSIS — E119 Type 2 diabetes mellitus without complications: Secondary | ICD-10-CM | POA: Diagnosis not present

## 2019-09-14 DIAGNOSIS — I739 Peripheral vascular disease, unspecified: Secondary | ICD-10-CM | POA: Diagnosis not present

## 2019-09-14 DIAGNOSIS — L603 Nail dystrophy: Secondary | ICD-10-CM | POA: Diagnosis not present

## 2019-09-14 DIAGNOSIS — B351 Tinea unguium: Secondary | ICD-10-CM | POA: Diagnosis not present

## 2019-09-17 DIAGNOSIS — F258 Other schizoaffective disorders: Secondary | ICD-10-CM | POA: Diagnosis not present

## 2019-09-24 DIAGNOSIS — K219 Gastro-esophageal reflux disease without esophagitis: Secondary | ICD-10-CM | POA: Diagnosis not present

## 2019-09-24 DIAGNOSIS — I82409 Acute embolism and thrombosis of unspecified deep veins of unspecified lower extremity: Secondary | ICD-10-CM | POA: Diagnosis not present

## 2019-09-24 DIAGNOSIS — G7000 Myasthenia gravis without (acute) exacerbation: Secondary | ICD-10-CM | POA: Diagnosis not present

## 2019-09-24 DIAGNOSIS — F0390 Unspecified dementia without behavioral disturbance: Secondary | ICD-10-CM | POA: Diagnosis not present

## 2019-10-05 DIAGNOSIS — E119 Type 2 diabetes mellitus without complications: Secondary | ICD-10-CM | POA: Diagnosis not present

## 2019-10-05 DIAGNOSIS — Z79899 Other long term (current) drug therapy: Secondary | ICD-10-CM | POA: Diagnosis not present

## 2019-10-05 DIAGNOSIS — D689 Coagulation defect, unspecified: Secondary | ICD-10-CM | POA: Diagnosis not present

## 2019-10-29 DIAGNOSIS — K219 Gastro-esophageal reflux disease without esophagitis: Secondary | ICD-10-CM | POA: Diagnosis not present

## 2019-10-29 DIAGNOSIS — F258 Other schizoaffective disorders: Secondary | ICD-10-CM | POA: Diagnosis not present

## 2019-10-29 DIAGNOSIS — I82409 Acute embolism and thrombosis of unspecified deep veins of unspecified lower extremity: Secondary | ICD-10-CM | POA: Diagnosis not present

## 2019-10-29 DIAGNOSIS — G7000 Myasthenia gravis without (acute) exacerbation: Secondary | ICD-10-CM | POA: Diagnosis not present

## 2019-10-29 DIAGNOSIS — F0390 Unspecified dementia without behavioral disturbance: Secondary | ICD-10-CM | POA: Diagnosis not present

## 2019-11-04 DIAGNOSIS — E119 Type 2 diabetes mellitus without complications: Secondary | ICD-10-CM | POA: Diagnosis not present

## 2019-11-04 DIAGNOSIS — Z79899 Other long term (current) drug therapy: Secondary | ICD-10-CM | POA: Diagnosis not present

## 2019-11-04 DIAGNOSIS — D649 Anemia, unspecified: Secondary | ICD-10-CM | POA: Diagnosis not present

## 2019-11-04 DIAGNOSIS — I82402 Acute embolism and thrombosis of unspecified deep veins of left lower extremity: Secondary | ICD-10-CM | POA: Diagnosis not present

## 2019-11-26 DIAGNOSIS — F0390 Unspecified dementia without behavioral disturbance: Secondary | ICD-10-CM | POA: Diagnosis not present

## 2019-11-26 DIAGNOSIS — K219 Gastro-esophageal reflux disease without esophagitis: Secondary | ICD-10-CM | POA: Diagnosis not present

## 2019-11-26 DIAGNOSIS — G7000 Myasthenia gravis without (acute) exacerbation: Secondary | ICD-10-CM | POA: Diagnosis not present

## 2019-11-26 DIAGNOSIS — I82409 Acute embolism and thrombosis of unspecified deep veins of unspecified lower extremity: Secondary | ICD-10-CM | POA: Diagnosis not present

## 2019-12-04 DIAGNOSIS — D689 Coagulation defect, unspecified: Secondary | ICD-10-CM | POA: Diagnosis not present

## 2019-12-04 DIAGNOSIS — E119 Type 2 diabetes mellitus without complications: Secondary | ICD-10-CM | POA: Diagnosis not present

## 2019-12-07 DIAGNOSIS — Z79899 Other long term (current) drug therapy: Secondary | ICD-10-CM | POA: Diagnosis not present

## 2019-12-07 DIAGNOSIS — E119 Type 2 diabetes mellitus without complications: Secondary | ICD-10-CM | POA: Diagnosis not present

## 2019-12-07 DIAGNOSIS — D689 Coagulation defect, unspecified: Secondary | ICD-10-CM | POA: Diagnosis not present

## 2019-12-31 DIAGNOSIS — F0390 Unspecified dementia without behavioral disturbance: Secondary | ICD-10-CM | POA: Diagnosis not present

## 2019-12-31 DIAGNOSIS — G7000 Myasthenia gravis without (acute) exacerbation: Secondary | ICD-10-CM | POA: Diagnosis not present

## 2019-12-31 DIAGNOSIS — K219 Gastro-esophageal reflux disease without esophagitis: Secondary | ICD-10-CM | POA: Diagnosis not present

## 2019-12-31 DIAGNOSIS — I82409 Acute embolism and thrombosis of unspecified deep veins of unspecified lower extremity: Secondary | ICD-10-CM | POA: Diagnosis not present

## 2020-01-04 DIAGNOSIS — E119 Type 2 diabetes mellitus without complications: Secondary | ICD-10-CM | POA: Diagnosis not present

## 2020-01-04 DIAGNOSIS — Z79899 Other long term (current) drug therapy: Secondary | ICD-10-CM | POA: Diagnosis not present

## 2020-01-04 DIAGNOSIS — D689 Coagulation defect, unspecified: Secondary | ICD-10-CM | POA: Diagnosis not present

## 2020-01-19 DIAGNOSIS — F258 Other schizoaffective disorders: Secondary | ICD-10-CM | POA: Diagnosis not present

## 2020-02-04 DIAGNOSIS — K219 Gastro-esophageal reflux disease without esophagitis: Secondary | ICD-10-CM | POA: Diagnosis not present

## 2020-02-04 DIAGNOSIS — I82409 Acute embolism and thrombosis of unspecified deep veins of unspecified lower extremity: Secondary | ICD-10-CM | POA: Diagnosis not present

## 2020-02-04 DIAGNOSIS — F0390 Unspecified dementia without behavioral disturbance: Secondary | ICD-10-CM | POA: Diagnosis not present

## 2020-02-04 DIAGNOSIS — G7000 Myasthenia gravis without (acute) exacerbation: Secondary | ICD-10-CM | POA: Diagnosis not present

## 2020-02-05 DIAGNOSIS — Z79899 Other long term (current) drug therapy: Secondary | ICD-10-CM | POA: Diagnosis not present

## 2020-02-05 DIAGNOSIS — I82402 Acute embolism and thrombosis of unspecified deep veins of left lower extremity: Secondary | ICD-10-CM | POA: Diagnosis not present

## 2020-02-05 DIAGNOSIS — D689 Coagulation defect, unspecified: Secondary | ICD-10-CM | POA: Diagnosis not present

## 2020-02-05 DIAGNOSIS — D649 Anemia, unspecified: Secondary | ICD-10-CM | POA: Diagnosis not present

## 2020-02-05 DIAGNOSIS — E119 Type 2 diabetes mellitus without complications: Secondary | ICD-10-CM | POA: Diagnosis not present

## 2020-02-09 DIAGNOSIS — Z1383 Encounter for screening for respiratory disorder NEC: Secondary | ICD-10-CM | POA: Diagnosis not present

## 2020-02-17 DIAGNOSIS — Z79899 Other long term (current) drug therapy: Secondary | ICD-10-CM | POA: Diagnosis not present

## 2020-02-17 DIAGNOSIS — I82402 Acute embolism and thrombosis of unspecified deep veins of left lower extremity: Secondary | ICD-10-CM | POA: Diagnosis not present

## 2020-02-17 DIAGNOSIS — I82403 Acute embolism and thrombosis of unspecified deep veins of lower extremity, bilateral: Secondary | ICD-10-CM | POA: Diagnosis not present

## 2020-02-18 DIAGNOSIS — I82409 Acute embolism and thrombosis of unspecified deep veins of unspecified lower extremity: Secondary | ICD-10-CM | POA: Diagnosis not present

## 2020-02-18 DIAGNOSIS — F0390 Unspecified dementia without behavioral disturbance: Secondary | ICD-10-CM | POA: Diagnosis not present

## 2020-02-18 DIAGNOSIS — K219 Gastro-esophageal reflux disease without esophagitis: Secondary | ICD-10-CM | POA: Diagnosis not present

## 2020-02-18 DIAGNOSIS — G7000 Myasthenia gravis without (acute) exacerbation: Secondary | ICD-10-CM | POA: Diagnosis not present

## 2020-02-29 DIAGNOSIS — Z1383 Encounter for screening for respiratory disorder NEC: Secondary | ICD-10-CM | POA: Diagnosis not present

## 2020-02-29 DIAGNOSIS — Z20828 Contact with and (suspected) exposure to other viral communicable diseases: Secondary | ICD-10-CM | POA: Diagnosis not present

## 2020-03-07 DIAGNOSIS — D689 Coagulation defect, unspecified: Secondary | ICD-10-CM | POA: Diagnosis not present

## 2020-03-07 DIAGNOSIS — Z79899 Other long term (current) drug therapy: Secondary | ICD-10-CM | POA: Diagnosis not present

## 2020-03-07 DIAGNOSIS — E119 Type 2 diabetes mellitus without complications: Secondary | ICD-10-CM | POA: Diagnosis not present

## 2020-03-08 DIAGNOSIS — Z20822 Contact with and (suspected) exposure to covid-19: Secondary | ICD-10-CM | POA: Diagnosis not present

## 2020-03-14 DIAGNOSIS — Z20822 Contact with and (suspected) exposure to covid-19: Secondary | ICD-10-CM | POA: Diagnosis not present

## 2020-03-15 DIAGNOSIS — Z961 Presence of intraocular lens: Secondary | ICD-10-CM | POA: Diagnosis not present

## 2020-03-15 DIAGNOSIS — H524 Presbyopia: Secondary | ICD-10-CM | POA: Diagnosis not present

## 2020-03-15 DIAGNOSIS — Z23 Encounter for immunization: Secondary | ICD-10-CM | POA: Diagnosis not present

## 2020-03-15 DIAGNOSIS — H43813 Vitreous degeneration, bilateral: Secondary | ICD-10-CM | POA: Diagnosis not present

## 2020-03-15 DIAGNOSIS — H04123 Dry eye syndrome of bilateral lacrimal glands: Secondary | ICD-10-CM | POA: Diagnosis not present

## 2020-03-17 DIAGNOSIS — F0390 Unspecified dementia without behavioral disturbance: Secondary | ICD-10-CM | POA: Diagnosis not present

## 2020-03-17 DIAGNOSIS — I82409 Acute embolism and thrombosis of unspecified deep veins of unspecified lower extremity: Secondary | ICD-10-CM | POA: Diagnosis not present

## 2020-03-17 DIAGNOSIS — G7000 Myasthenia gravis without (acute) exacerbation: Secondary | ICD-10-CM | POA: Diagnosis not present

## 2020-03-17 DIAGNOSIS — K219 Gastro-esophageal reflux disease without esophagitis: Secondary | ICD-10-CM | POA: Diagnosis not present

## 2020-03-17 DIAGNOSIS — K429 Umbilical hernia without obstruction or gangrene: Secondary | ICD-10-CM | POA: Diagnosis not present

## 2020-03-21 DIAGNOSIS — Z20822 Contact with and (suspected) exposure to covid-19: Secondary | ICD-10-CM | POA: Diagnosis not present

## 2020-03-22 DIAGNOSIS — Z20822 Contact with and (suspected) exposure to covid-19: Secondary | ICD-10-CM | POA: Diagnosis not present

## 2020-04-04 DIAGNOSIS — Z20822 Contact with and (suspected) exposure to covid-19: Secondary | ICD-10-CM | POA: Diagnosis not present

## 2020-04-06 DIAGNOSIS — D689 Coagulation defect, unspecified: Secondary | ICD-10-CM | POA: Diagnosis not present

## 2020-04-06 DIAGNOSIS — Z79899 Other long term (current) drug therapy: Secondary | ICD-10-CM | POA: Diagnosis not present

## 2020-04-06 DIAGNOSIS — E119 Type 2 diabetes mellitus without complications: Secondary | ICD-10-CM | POA: Diagnosis not present

## 2020-04-11 DIAGNOSIS — F319 Bipolar disorder, unspecified: Secondary | ICD-10-CM | POA: Diagnosis not present

## 2020-04-11 DIAGNOSIS — Z20822 Contact with and (suspected) exposure to covid-19: Secondary | ICD-10-CM | POA: Diagnosis not present

## 2020-04-13 DIAGNOSIS — F3112 Bipolar disorder, current episode manic without psychotic features, moderate: Secondary | ICD-10-CM | POA: Diagnosis not present

## 2020-04-13 DIAGNOSIS — Z23 Encounter for immunization: Secondary | ICD-10-CM | POA: Diagnosis not present

## 2020-04-18 DIAGNOSIS — Z20822 Contact with and (suspected) exposure to covid-19: Secondary | ICD-10-CM | POA: Diagnosis not present

## 2020-04-21 DIAGNOSIS — K219 Gastro-esophageal reflux disease without esophagitis: Secondary | ICD-10-CM | POA: Diagnosis not present

## 2020-04-21 DIAGNOSIS — I82409 Acute embolism and thrombosis of unspecified deep veins of unspecified lower extremity: Secondary | ICD-10-CM | POA: Diagnosis not present

## 2020-04-21 DIAGNOSIS — K429 Umbilical hernia without obstruction or gangrene: Secondary | ICD-10-CM | POA: Diagnosis not present

## 2020-04-21 DIAGNOSIS — F0390 Unspecified dementia without behavioral disturbance: Secondary | ICD-10-CM | POA: Diagnosis not present

## 2020-04-21 DIAGNOSIS — G7000 Myasthenia gravis without (acute) exacerbation: Secondary | ICD-10-CM | POA: Diagnosis not present

## 2020-04-25 DIAGNOSIS — Z20822 Contact with and (suspected) exposure to covid-19: Secondary | ICD-10-CM | POA: Diagnosis not present

## 2020-05-02 DIAGNOSIS — Z20822 Contact with and (suspected) exposure to covid-19: Secondary | ICD-10-CM | POA: Diagnosis not present

## 2020-05-06 DIAGNOSIS — E119 Type 2 diabetes mellitus without complications: Secondary | ICD-10-CM | POA: Diagnosis not present

## 2020-05-06 DIAGNOSIS — D649 Anemia, unspecified: Secondary | ICD-10-CM | POA: Diagnosis not present

## 2020-05-06 DIAGNOSIS — Z79899 Other long term (current) drug therapy: Secondary | ICD-10-CM | POA: Diagnosis not present

## 2020-05-06 DIAGNOSIS — I82402 Acute embolism and thrombosis of unspecified deep veins of left lower extremity: Secondary | ICD-10-CM | POA: Diagnosis not present

## 2020-05-12 DIAGNOSIS — F3112 Bipolar disorder, current episode manic without psychotic features, moderate: Secondary | ICD-10-CM | POA: Diagnosis not present

## 2020-05-19 DIAGNOSIS — G7000 Myasthenia gravis without (acute) exacerbation: Secondary | ICD-10-CM | POA: Diagnosis not present

## 2020-05-19 DIAGNOSIS — K429 Umbilical hernia without obstruction or gangrene: Secondary | ICD-10-CM | POA: Diagnosis not present

## 2020-05-19 DIAGNOSIS — F0390 Unspecified dementia without behavioral disturbance: Secondary | ICD-10-CM | POA: Diagnosis not present

## 2020-05-19 DIAGNOSIS — I82409 Acute embolism and thrombosis of unspecified deep veins of unspecified lower extremity: Secondary | ICD-10-CM | POA: Diagnosis not present

## 2020-05-19 DIAGNOSIS — K219 Gastro-esophageal reflux disease without esophagitis: Secondary | ICD-10-CM | POA: Diagnosis not present

## 2020-05-30 DIAGNOSIS — Z20822 Contact with and (suspected) exposure to covid-19: Secondary | ICD-10-CM | POA: Diagnosis not present

## 2020-06-06 DIAGNOSIS — Z20822 Contact with and (suspected) exposure to covid-19: Secondary | ICD-10-CM | POA: Diagnosis not present

## 2020-06-14 DIAGNOSIS — F3112 Bipolar disorder, current episode manic without psychotic features, moderate: Secondary | ICD-10-CM | POA: Diagnosis not present

## 2020-06-30 DIAGNOSIS — K219 Gastro-esophageal reflux disease without esophagitis: Secondary | ICD-10-CM | POA: Diagnosis not present

## 2020-06-30 DIAGNOSIS — K429 Umbilical hernia without obstruction or gangrene: Secondary | ICD-10-CM | POA: Diagnosis not present

## 2020-06-30 DIAGNOSIS — G7000 Myasthenia gravis without (acute) exacerbation: Secondary | ICD-10-CM | POA: Diagnosis not present

## 2020-06-30 DIAGNOSIS — I82409 Acute embolism and thrombosis of unspecified deep veins of unspecified lower extremity: Secondary | ICD-10-CM | POA: Diagnosis not present

## 2020-06-30 DIAGNOSIS — F0390 Unspecified dementia without behavioral disturbance: Secondary | ICD-10-CM | POA: Diagnosis not present

## 2020-07-05 DIAGNOSIS — Z20822 Contact with and (suspected) exposure to covid-19: Secondary | ICD-10-CM | POA: Diagnosis not present

## 2020-07-12 DIAGNOSIS — F3112 Bipolar disorder, current episode manic without psychotic features, moderate: Secondary | ICD-10-CM | POA: Diagnosis not present

## 2020-07-14 DIAGNOSIS — G7001 Myasthenia gravis with (acute) exacerbation: Secondary | ICD-10-CM | POA: Diagnosis not present

## 2020-07-14 DIAGNOSIS — R278 Other lack of coordination: Secondary | ICD-10-CM | POA: Diagnosis not present

## 2020-07-18 DIAGNOSIS — D689 Coagulation defect, unspecified: Secondary | ICD-10-CM | POA: Diagnosis not present

## 2020-07-18 DIAGNOSIS — I82403 Acute embolism and thrombosis of unspecified deep veins of lower extremity, bilateral: Secondary | ICD-10-CM | POA: Diagnosis not present

## 2020-07-18 DIAGNOSIS — I82402 Acute embolism and thrombosis of unspecified deep veins of left lower extremity: Secondary | ICD-10-CM | POA: Diagnosis not present

## 2020-07-18 DIAGNOSIS — E119 Type 2 diabetes mellitus without complications: Secondary | ICD-10-CM | POA: Diagnosis not present

## 2020-07-28 DIAGNOSIS — F0390 Unspecified dementia without behavioral disturbance: Secondary | ICD-10-CM | POA: Diagnosis not present

## 2020-07-28 DIAGNOSIS — G7000 Myasthenia gravis without (acute) exacerbation: Secondary | ICD-10-CM | POA: Diagnosis not present

## 2020-07-28 DIAGNOSIS — K429 Umbilical hernia without obstruction or gangrene: Secondary | ICD-10-CM | POA: Diagnosis not present

## 2020-07-28 DIAGNOSIS — K219 Gastro-esophageal reflux disease without esophagitis: Secondary | ICD-10-CM | POA: Diagnosis not present

## 2020-07-28 DIAGNOSIS — I82409 Acute embolism and thrombosis of unspecified deep veins of unspecified lower extremity: Secondary | ICD-10-CM | POA: Diagnosis not present

## 2020-08-08 DIAGNOSIS — E119 Type 2 diabetes mellitus without complications: Secondary | ICD-10-CM | POA: Diagnosis not present

## 2020-08-08 DIAGNOSIS — Z79899 Other long term (current) drug therapy: Secondary | ICD-10-CM | POA: Diagnosis not present

## 2020-08-08 DIAGNOSIS — I82402 Acute embolism and thrombosis of unspecified deep veins of left lower extremity: Secondary | ICD-10-CM | POA: Diagnosis not present

## 2020-08-08 DIAGNOSIS — D649 Anemia, unspecified: Secondary | ICD-10-CM | POA: Diagnosis not present

## 2020-08-09 DIAGNOSIS — F3112 Bipolar disorder, current episode manic without psychotic features, moderate: Secondary | ICD-10-CM | POA: Diagnosis not present

## 2020-08-18 DIAGNOSIS — K429 Umbilical hernia without obstruction or gangrene: Secondary | ICD-10-CM | POA: Diagnosis not present

## 2020-08-18 DIAGNOSIS — G7000 Myasthenia gravis without (acute) exacerbation: Secondary | ICD-10-CM | POA: Diagnosis not present

## 2020-08-18 DIAGNOSIS — K219 Gastro-esophageal reflux disease without esophagitis: Secondary | ICD-10-CM | POA: Diagnosis not present

## 2020-08-18 DIAGNOSIS — I82409 Acute embolism and thrombosis of unspecified deep veins of unspecified lower extremity: Secondary | ICD-10-CM | POA: Diagnosis not present

## 2020-08-18 DIAGNOSIS — F0390 Unspecified dementia without behavioral disturbance: Secondary | ICD-10-CM | POA: Diagnosis not present

## 2020-09-13 DIAGNOSIS — F3112 Bipolar disorder, current episode manic without psychotic features, moderate: Secondary | ICD-10-CM | POA: Diagnosis not present

## 2020-09-22 DIAGNOSIS — G7000 Myasthenia gravis without (acute) exacerbation: Secondary | ICD-10-CM | POA: Diagnosis not present

## 2020-09-22 DIAGNOSIS — F0390 Unspecified dementia without behavioral disturbance: Secondary | ICD-10-CM | POA: Diagnosis not present

## 2020-09-22 DIAGNOSIS — K429 Umbilical hernia without obstruction or gangrene: Secondary | ICD-10-CM | POA: Diagnosis not present

## 2020-09-22 DIAGNOSIS — I82409 Acute embolism and thrombosis of unspecified deep veins of unspecified lower extremity: Secondary | ICD-10-CM | POA: Diagnosis not present

## 2020-09-22 DIAGNOSIS — K219 Gastro-esophageal reflux disease without esophagitis: Secondary | ICD-10-CM | POA: Diagnosis not present

## 2020-10-20 DIAGNOSIS — F0390 Unspecified dementia without behavioral disturbance: Secondary | ICD-10-CM | POA: Diagnosis not present

## 2020-10-20 DIAGNOSIS — G7000 Myasthenia gravis without (acute) exacerbation: Secondary | ICD-10-CM | POA: Diagnosis not present

## 2020-10-20 DIAGNOSIS — K219 Gastro-esophageal reflux disease without esophagitis: Secondary | ICD-10-CM | POA: Diagnosis not present

## 2020-10-20 DIAGNOSIS — K429 Umbilical hernia without obstruction or gangrene: Secondary | ICD-10-CM | POA: Diagnosis not present

## 2020-10-20 DIAGNOSIS — I82409 Acute embolism and thrombosis of unspecified deep veins of unspecified lower extremity: Secondary | ICD-10-CM | POA: Diagnosis not present

## 2020-11-04 DIAGNOSIS — E119 Type 2 diabetes mellitus without complications: Secondary | ICD-10-CM | POA: Diagnosis not present

## 2020-11-04 DIAGNOSIS — D649 Anemia, unspecified: Secondary | ICD-10-CM | POA: Diagnosis not present

## 2020-11-04 DIAGNOSIS — I82402 Acute embolism and thrombosis of unspecified deep veins of left lower extremity: Secondary | ICD-10-CM | POA: Diagnosis not present

## 2020-11-04 DIAGNOSIS — Z79899 Other long term (current) drug therapy: Secondary | ICD-10-CM | POA: Diagnosis not present

## 2020-11-10 DIAGNOSIS — K429 Umbilical hernia without obstruction or gangrene: Secondary | ICD-10-CM | POA: Diagnosis not present

## 2020-11-10 DIAGNOSIS — F0390 Unspecified dementia without behavioral disturbance: Secondary | ICD-10-CM | POA: Diagnosis not present

## 2020-11-10 DIAGNOSIS — G7000 Myasthenia gravis without (acute) exacerbation: Secondary | ICD-10-CM | POA: Diagnosis not present

## 2020-11-10 DIAGNOSIS — K219 Gastro-esophageal reflux disease without esophagitis: Secondary | ICD-10-CM | POA: Diagnosis not present

## 2020-11-10 DIAGNOSIS — I82409 Acute embolism and thrombosis of unspecified deep veins of unspecified lower extremity: Secondary | ICD-10-CM | POA: Diagnosis not present

## 2020-11-11 DIAGNOSIS — F3112 Bipolar disorder, current episode manic without psychotic features, moderate: Secondary | ICD-10-CM | POA: Diagnosis not present

## 2020-11-16 DIAGNOSIS — G7001 Myasthenia gravis with (acute) exacerbation: Secondary | ICD-10-CM | POA: Diagnosis not present

## 2020-11-16 DIAGNOSIS — R4181 Age-related cognitive decline: Secondary | ICD-10-CM | POA: Diagnosis not present

## 2020-12-02 DIAGNOSIS — F250 Schizoaffective disorder, bipolar type: Secondary | ICD-10-CM | POA: Diagnosis not present

## 2020-12-05 DIAGNOSIS — E119 Type 2 diabetes mellitus without complications: Secondary | ICD-10-CM | POA: Diagnosis not present

## 2020-12-05 DIAGNOSIS — D689 Coagulation defect, unspecified: Secondary | ICD-10-CM | POA: Diagnosis not present

## 2020-12-08 DIAGNOSIS — Z23 Encounter for immunization: Secondary | ICD-10-CM | POA: Diagnosis not present

## 2020-12-22 DIAGNOSIS — G7000 Myasthenia gravis without (acute) exacerbation: Secondary | ICD-10-CM | POA: Diagnosis not present

## 2020-12-22 DIAGNOSIS — I82409 Acute embolism and thrombosis of unspecified deep veins of unspecified lower extremity: Secondary | ICD-10-CM | POA: Diagnosis not present

## 2020-12-22 DIAGNOSIS — K219 Gastro-esophageal reflux disease without esophagitis: Secondary | ICD-10-CM | POA: Diagnosis not present

## 2020-12-22 DIAGNOSIS — F0390 Unspecified dementia without behavioral disturbance: Secondary | ICD-10-CM | POA: Diagnosis not present

## 2021-01-13 DIAGNOSIS — F250 Schizoaffective disorder, bipolar type: Secondary | ICD-10-CM | POA: Diagnosis not present

## 2021-01-26 DIAGNOSIS — F0390 Unspecified dementia without behavioral disturbance: Secondary | ICD-10-CM | POA: Diagnosis not present

## 2021-01-26 DIAGNOSIS — I82409 Acute embolism and thrombosis of unspecified deep veins of unspecified lower extremity: Secondary | ICD-10-CM | POA: Diagnosis not present

## 2021-01-26 DIAGNOSIS — G7000 Myasthenia gravis without (acute) exacerbation: Secondary | ICD-10-CM | POA: Diagnosis not present

## 2021-01-26 DIAGNOSIS — K429 Umbilical hernia without obstruction or gangrene: Secondary | ICD-10-CM | POA: Diagnosis not present

## 2021-01-26 DIAGNOSIS — K219 Gastro-esophageal reflux disease without esophagitis: Secondary | ICD-10-CM | POA: Diagnosis not present

## 2021-02-03 DIAGNOSIS — Z79899 Other long term (current) drug therapy: Secondary | ICD-10-CM | POA: Diagnosis not present

## 2021-02-03 DIAGNOSIS — D649 Anemia, unspecified: Secondary | ICD-10-CM | POA: Diagnosis not present

## 2021-02-03 DIAGNOSIS — I82402 Acute embolism and thrombosis of unspecified deep veins of left lower extremity: Secondary | ICD-10-CM | POA: Diagnosis not present

## 2021-02-03 DIAGNOSIS — E119 Type 2 diabetes mellitus without complications: Secondary | ICD-10-CM | POA: Diagnosis not present

## 2021-02-16 DIAGNOSIS — K219 Gastro-esophageal reflux disease without esophagitis: Secondary | ICD-10-CM | POA: Diagnosis not present

## 2021-02-16 DIAGNOSIS — I82409 Acute embolism and thrombosis of unspecified deep veins of unspecified lower extremity: Secondary | ICD-10-CM | POA: Diagnosis not present

## 2021-02-16 DIAGNOSIS — G7000 Myasthenia gravis without (acute) exacerbation: Secondary | ICD-10-CM | POA: Diagnosis not present

## 2021-02-16 DIAGNOSIS — F0390 Unspecified dementia without behavioral disturbance: Secondary | ICD-10-CM | POA: Diagnosis not present

## 2021-02-24 DIAGNOSIS — F250 Schizoaffective disorder, bipolar type: Secondary | ICD-10-CM | POA: Diagnosis not present

## 2021-03-15 DIAGNOSIS — Z23 Encounter for immunization: Secondary | ICD-10-CM | POA: Diagnosis not present

## 2021-03-23 DIAGNOSIS — K219 Gastro-esophageal reflux disease without esophagitis: Secondary | ICD-10-CM | POA: Diagnosis not present

## 2021-03-23 DIAGNOSIS — F0390 Unspecified dementia without behavioral disturbance: Secondary | ICD-10-CM | POA: Diagnosis not present

## 2021-03-23 DIAGNOSIS — G7000 Myasthenia gravis without (acute) exacerbation: Secondary | ICD-10-CM | POA: Diagnosis not present

## 2021-03-23 DIAGNOSIS — I82409 Acute embolism and thrombosis of unspecified deep veins of unspecified lower extremity: Secondary | ICD-10-CM | POA: Diagnosis not present

## 2021-04-06 DIAGNOSIS — Z23 Encounter for immunization: Secondary | ICD-10-CM | POA: Diagnosis not present

## 2021-04-21 DIAGNOSIS — G7000 Myasthenia gravis without (acute) exacerbation: Secondary | ICD-10-CM | POA: Diagnosis not present

## 2021-04-21 DIAGNOSIS — K219 Gastro-esophageal reflux disease without esophagitis: Secondary | ICD-10-CM | POA: Diagnosis not present

## 2021-04-21 DIAGNOSIS — F0390 Unspecified dementia without behavioral disturbance: Secondary | ICD-10-CM | POA: Diagnosis not present

## 2021-04-21 DIAGNOSIS — I82409 Acute embolism and thrombosis of unspecified deep veins of unspecified lower extremity: Secondary | ICD-10-CM | POA: Diagnosis not present

## 2021-05-08 DIAGNOSIS — Z79899 Other long term (current) drug therapy: Secondary | ICD-10-CM | POA: Diagnosis not present

## 2021-05-08 DIAGNOSIS — D649 Anemia, unspecified: Secondary | ICD-10-CM | POA: Diagnosis not present

## 2021-05-08 DIAGNOSIS — I82402 Acute embolism and thrombosis of unspecified deep veins of left lower extremity: Secondary | ICD-10-CM | POA: Diagnosis not present

## 2021-05-08 DIAGNOSIS — E119 Type 2 diabetes mellitus without complications: Secondary | ICD-10-CM | POA: Diagnosis not present

## 2021-05-10 DIAGNOSIS — D6949 Other primary thrombocytopenia: Secondary | ICD-10-CM | POA: Diagnosis not present

## 2021-05-25 DIAGNOSIS — K219 Gastro-esophageal reflux disease without esophagitis: Secondary | ICD-10-CM | POA: Diagnosis not present

## 2021-05-25 DIAGNOSIS — G7000 Myasthenia gravis without (acute) exacerbation: Secondary | ICD-10-CM | POA: Diagnosis not present

## 2021-05-25 DIAGNOSIS — I82409 Acute embolism and thrombosis of unspecified deep veins of unspecified lower extremity: Secondary | ICD-10-CM | POA: Diagnosis not present

## 2021-05-25 DIAGNOSIS — F0390 Unspecified dementia without behavioral disturbance: Secondary | ICD-10-CM | POA: Diagnosis not present

## 2021-06-04 DIAGNOSIS — E119 Type 2 diabetes mellitus without complications: Secondary | ICD-10-CM | POA: Diagnosis not present

## 2021-06-04 DIAGNOSIS — D689 Coagulation defect, unspecified: Secondary | ICD-10-CM | POA: Diagnosis not present

## 2021-06-22 DIAGNOSIS — K219 Gastro-esophageal reflux disease without esophagitis: Secondary | ICD-10-CM | POA: Diagnosis not present

## 2021-06-22 DIAGNOSIS — I82409 Acute embolism and thrombosis of unspecified deep veins of unspecified lower extremity: Secondary | ICD-10-CM | POA: Diagnosis not present

## 2021-06-22 DIAGNOSIS — G7000 Myasthenia gravis without (acute) exacerbation: Secondary | ICD-10-CM | POA: Diagnosis not present

## 2021-06-22 DIAGNOSIS — F0390 Unspecified dementia without behavioral disturbance: Secondary | ICD-10-CM | POA: Diagnosis not present

## 2021-07-16 DIAGNOSIS — E119 Type 2 diabetes mellitus without complications: Secondary | ICD-10-CM | POA: Diagnosis not present

## 2021-07-16 DIAGNOSIS — I82402 Acute embolism and thrombosis of unspecified deep veins of left lower extremity: Secondary | ICD-10-CM | POA: Diagnosis not present

## 2021-07-16 DIAGNOSIS — D689 Coagulation defect, unspecified: Secondary | ICD-10-CM | POA: Diagnosis not present

## 2021-07-16 DIAGNOSIS — I82403 Acute embolism and thrombosis of unspecified deep veins of lower extremity, bilateral: Secondary | ICD-10-CM | POA: Diagnosis not present

## 2021-07-20 DIAGNOSIS — K219 Gastro-esophageal reflux disease without esophagitis: Secondary | ICD-10-CM | POA: Diagnosis not present

## 2021-07-20 DIAGNOSIS — F0390 Unspecified dementia without behavioral disturbance: Secondary | ICD-10-CM | POA: Diagnosis not present

## 2021-07-20 DIAGNOSIS — I82409 Acute embolism and thrombosis of unspecified deep veins of unspecified lower extremity: Secondary | ICD-10-CM | POA: Diagnosis not present

## 2021-07-20 DIAGNOSIS — G7000 Myasthenia gravis without (acute) exacerbation: Secondary | ICD-10-CM | POA: Diagnosis not present

## 2021-08-07 DIAGNOSIS — Z79899 Other long term (current) drug therapy: Secondary | ICD-10-CM | POA: Diagnosis not present

## 2021-08-07 DIAGNOSIS — E119 Type 2 diabetes mellitus without complications: Secondary | ICD-10-CM | POA: Diagnosis not present

## 2021-08-07 DIAGNOSIS — D649 Anemia, unspecified: Secondary | ICD-10-CM | POA: Diagnosis not present

## 2021-08-07 DIAGNOSIS — I82402 Acute embolism and thrombosis of unspecified deep veins of left lower extremity: Secondary | ICD-10-CM | POA: Diagnosis not present

## 2021-11-01 ENCOUNTER — Emergency Department (HOSPITAL_BASED_OUTPATIENT_CLINIC_OR_DEPARTMENT_OTHER): Payer: Medicare Other

## 2021-11-01 ENCOUNTER — Other Ambulatory Visit: Payer: Self-pay

## 2021-11-01 ENCOUNTER — Emergency Department (HOSPITAL_COMMUNITY)
Admission: EM | Admit: 2021-11-01 | Discharge: 2021-11-01 | Disposition: A | Discharge status: Home / Self Care | Payer: Medicare Other | Attending: Emergency Medicine | Admitting: Emergency Medicine

## 2021-11-01 DIAGNOSIS — R609 Edema, unspecified: Secondary | ICD-10-CM | POA: Diagnosis not present

## 2021-11-01 DIAGNOSIS — Z7901 Long term (current) use of anticoagulants: Secondary | ICD-10-CM | POA: Diagnosis not present

## 2021-11-01 DIAGNOSIS — Z79899 Other long term (current) drug therapy: Secondary | ICD-10-CM | POA: Insufficient documentation

## 2021-11-01 DIAGNOSIS — L03116 Cellulitis of left lower limb: Secondary | ICD-10-CM

## 2021-11-01 DIAGNOSIS — S80812A Abrasion, left lower leg, initial encounter: Secondary | ICD-10-CM | POA: Diagnosis not present

## 2021-11-01 DIAGNOSIS — S8992XA Unspecified injury of left lower leg, initial encounter: Secondary | ICD-10-CM | POA: Diagnosis present

## 2021-11-01 DIAGNOSIS — M79662 Pain in left lower leg: Secondary | ICD-10-CM

## 2021-11-01 DIAGNOSIS — I10 Essential (primary) hypertension: Secondary | ICD-10-CM | POA: Diagnosis not present

## 2021-11-01 DIAGNOSIS — W228XXA Striking against or struck by other objects, initial encounter: Secondary | ICD-10-CM | POA: Insufficient documentation

## 2021-11-01 DIAGNOSIS — Z23 Encounter for immunization: Secondary | ICD-10-CM | POA: Diagnosis not present

## 2021-11-01 DIAGNOSIS — Z743 Need for continuous supervision: Secondary | ICD-10-CM | POA: Diagnosis not present

## 2021-11-01 DIAGNOSIS — Z7982 Long term (current) use of aspirin: Secondary | ICD-10-CM | POA: Diagnosis not present

## 2021-11-01 DIAGNOSIS — R0689 Other abnormalities of breathing: Secondary | ICD-10-CM | POA: Diagnosis not present

## 2021-11-01 DIAGNOSIS — R531 Weakness: Secondary | ICD-10-CM | POA: Diagnosis not present

## 2021-11-01 LAB — BASIC METABOLIC PANEL
Anion gap: 8 (ref 5–15)
BUN: 18 mg/dL (ref 8–23)
CO2: 24 mmol/L (ref 22–32)
Calcium: 9.2 mg/dL (ref 8.9–10.3)
Chloride: 106 mmol/L (ref 98–111)
Creatinine, Ser: 0.93 mg/dL (ref 0.61–1.24)
GFR, Estimated: >60 mL/min (ref >60)
Glucose, Bld: 122 mg/dL — ABNORMAL HIGH (ref 70–99)
Potassium: 4.6 mmol/L (ref 3.5–5.1)
Sodium: 138 mmol/L (ref 135–145)

## 2021-11-01 LAB — PROTIME-INR
INR: 2.7 — ABNORMAL HIGH (ref 0.8–1.2)
Prothrombin Time: 28.3 seconds — ABNORMAL HIGH (ref 11.4–15.2)

## 2021-11-01 LAB — CBC WITH DIFFERENTIAL/PLATELET
Abs Immature Granulocytes: 0.02 10*3/uL (ref 0.00–0.07)
Basophils Absolute: 0 10*3/uL (ref 0.0–0.1)
Basophils Relative: 1 %
Eosinophils Absolute: 0.1 10*3/uL (ref 0.0–0.5)
Eosinophils Relative: 1 %
HCT: 43.9 % (ref 39.0–52.0)
Hemoglobin: 15.1 g/dL (ref 13.0–17.0)
Immature Granulocytes: 0 %
Lymphocytes Relative: 8 %
Lymphs Abs: 0.5 10*3/uL — ABNORMAL LOW (ref 0.7–4.0)
MCH: 35.1 pg — ABNORMAL HIGH (ref 26.0–34.0)
MCHC: 34.4 g/dL (ref 30.0–36.0)
MCV: 102.1 fL — ABNORMAL HIGH (ref 80.0–100.0)
Monocytes Absolute: 0.5 10*3/uL (ref 0.1–1.0)
Monocytes Relative: 9 %
Neutro Abs: 4.7 10*3/uL (ref 1.7–7.7)
Neutrophils Relative %: 81 %
Platelets: 163 10*3/uL (ref 150–400)
RBC: 4.3 MIL/uL (ref 4.22–5.81)
RDW: 14.7 % (ref 11.5–15.5)
WBC: 5.8 10*3/uL (ref 4.0–10.5)
nRBC: 0 % (ref 0.0–0.2)

## 2021-11-01 MED ORDER — TETANUS-DIPHTH-ACELL PERTUSSIS 5-2.5-18.5 LF-MCG/0.5 IM SUSY
0.5000 mL | PREFILLED_SYRINGE | Freq: Once | INTRAMUSCULAR | Status: AC
  Administered 2021-11-01: 0.5 mL via INTRAMUSCULAR
  Filled 2021-11-01: qty 0.5

## 2021-11-01 MED ORDER — CEFUROXIME AXETIL 500 MG PO TABS
500.0000 mg | ORAL_TABLET | Freq: Two times a day (BID) | ORAL | 0 refills | Status: AC
Start: 2021-11-01 — End: 2021-11-06

## 2021-11-01 NOTE — ED Notes (Signed)
Vascular at bedside

## 2021-11-01 NOTE — ED Notes (Signed)
[facility] called, pt 13th on list. Food tray placed

## 2021-11-01 NOTE — ED Triage Notes (Signed)
Via EMS from SNF. Pt states hit LLE on bed frame 2 days ago and today its red to area with warmth and pain. Pt ambulatory. On eliquis GCS 15

## 2021-11-01 NOTE — Progress Notes (Signed)
LLE venous duplex has been completed.  Preliminary results given to Dr. Karene Fry via secure chat.     Results can be found under chart review under CV PROC. 11/01/2021 4:53 PM Levi Crass RVT, RDMS for Jean Rosenthal RVT/RDMS

## 2021-11-01 NOTE — ED Provider Notes (Signed)
[facility] EMERGENCY DEPARTMENT Provider Note   CSN: [id number] Arrival date & time: 11/01/21  1522     History  Chief Complaint  Patient presents with   Leg Pain    Victor Durham. is a 66 y.o. male.   Leg Pain  66 year old male with medical history significant for HTN, HLD, myasthenia gravis, prior DVT on Coumadin who presents to the emergency department from his skilled nursing facility with left lower extremity redness and swelling.  The patient states that he struck his anterior left lower leg on a dresser 2 days ago.  Since then he has had spreading redness and swelling and pain from a small abrasion that was sustained.  No fever or chills.  Home Medications Prior to Admission medications   Medication Sig Start Date End Date Taking? Authorizing Provider  cefUROXime (CEFTIN) 500 MG tablet Take 1 tablet (500 mg total) by mouth 2 (two) times daily with a meal for 5 days. 11/01/21 11/06/21 Yes Regan Lemming, MD  acetaminophen (TYLENOL) 325 MG tablet Take 650 mg by mouth 2 (two) times daily. Take 2 tablets twice daily at 6am and 9pm    [provider]  albuterol (PROVENTIL) (2.5 MG/3ML) 0.083% nebulizer solution Take 2.5 mg by nebulization every 8 (eight) hours as needed for wheezing or shortness of breath.    [provider]  aspirin 81 MG chewable tablet Give 81 mg by tube daily.    [provider]  atorvastatin (LIPITOR) 20 MG tablet Take 1 tablet (20 mg total) by mouth daily. 10/13/14   Funches, Adriana Mccallum, MD  azaTHIOprine (IMURAN) 50 MG tablet Take 100 mg by mouth daily.    [provider]  docusate sodium (COLACE) 100 MG capsule Take 1 capsule (100 mg total) by mouth every 12 (twelve) hours. 11/30/15   Charlesetta Shanks, MD  Multiple Vitamins-Minerals (CERTA-VITE PO) Take 1 tablet by mouth daily.    [provider]  pantoprazole (PROTONIX) 40 MG tablet Take 40 mg by mouth daily.    [provider]   predniSONE (DELTASONE) 1 MG tablet Begin 4 tablets daily and taper by one tablet every 2 months until off 03/13/17   Kathrynn Ducking, MD  pyridostigmine (MESTINON) 60 MG tablet Take 90 mg by mouth 5 (five) times daily. Take 1 and 1/2 tabs 5 times daily.    [provider]  risperiDONE (RISPERDAL) 1 MG tablet Take 1 mg by mouth 2 (two) times daily.    [provider]  risperiDONE microspheres (RISPERDAL CONSTA) 25 MG injection Inject 25 mg into the muscle every 14 (fourteen) days.    [provider]  Sodium Fluoride (LISTERINE TOTAL CARE MT) Use as directed 1 Dose in the mouth or throat 4 (four) times daily.    [provider]  warfarin (COUMADIN) 1 MG tablet Take 5 mg by mouth daily.     [provider]      Allergies    Patient has no known allergies.    Review of Systems   Review of Systems  All other systems reviewed and are negative.  Physical Exam Updated Vital Signs BP 140/83   Pulse 62   Temp 99 F (37.2 C) (Oral)   Resp 13   Ht 5\' 10"  (1.778 m)   Wt 97 kg   SpO2 93%   BMI 30.68 kg/m  Physical Exam Vitals and nursing note reviewed.  Constitutional:      General: He is not in acute  distress.    Appearance: He is well-developed.  HENT:     Head: Normocephalic and atraumatic.  Eyes:     Conjunctiva/sclera: Conjunctivae normal.  Cardiovascular:     Rate and Rhythm: Normal rate and regular rhythm.  Pulmonary:     Effort: Pulmonary effort is normal. No respiratory distress.     Breath sounds: Normal breath sounds.  Abdominal:     Palpations: Abdomen is soft.     Tenderness: There is no abdominal tenderness.  Musculoskeletal:        General: Swelling present.     Cervical back: Neck supple.     Comments: Left lower extremity with small anterior abrasion, hemostatic and appears to be healing, surrounding erythema tracking down the patient's left lower extremity with 1+ pitting edema, mild tenderness to palpation, no  fluctuance, no crepitus.  2+ DP pulses bilaterally  Skin:    General: Skin is warm and dry.     Capillary Refill: Capillary refill takes less than 2 seconds.     Findings: Erythema present.  Neurological:     Mental Status: He is alert.  Psychiatric:        Mood and Affect: Mood normal.    ED Results / Procedures / Treatments   Labs (all labs ordered are listed, but only abnormal results are displayed) Labs Reviewed  CBC WITH DIFFERENTIAL/PLATELET - Abnormal; Notable for the following components:      Result Value   MCV 102.1 (*)    MCH 35.1 (*)    Lymphs Abs 0.5 (*)    All other components within normal limits  BASIC METABOLIC PANEL - Abnormal; Notable for the following components:   Glucose, Bld 122 (*)    All other components within normal limits  PROTIME-INR - Abnormal; Notable for the following components:   Prothrombin Time 28.3 (*)    INR 2.7 (*)    All other components within normal limits    EKG None  Radiology No results found.  Procedures Procedures    Medications Ordered in ED Medications  Tdap (BOOSTRIX) injection 0.5 mL (0.5 mLs Intramuscular Given 11/01/21 1634)    ED Course/ Medical Decision Making/ A&P                           Medical Decision Making Amount and/or Complexity of Data Reviewed Labs: ordered.  Risk Prescription drug management.   66 year old male with medical history significant for HTN, HLD, myasthenia gravis, prior DVT on Coumadin who presents to the emergency department from his skilled nursing facility with left lower extremity redness and swelling.  The patient states that he struck his anterior left lower leg on a dresser 2 days ago.  Since then he has had spreading redness and swelling and pain from a small abrasion that was sustained.  No fever or chills.  On arrival, the patient was vitally stable, temperature 99, pulse 69, BP 154/84, normal sinus rhythm noted on cardiac telemetry.  Physical exam significant for the  following: Left lower extremity with small anterior abrasion, hemostatic and appears to be healing, surrounding erythema tracking down the patient's left lower extremity with 1+ pitting edema, mild tenderness to palpation, no fluctuance, no crepitus.  2+ DP pulses bilaterally  My primary concern is for a left lower extremity cellulitis, however given the patient's history will obtain left lower extremity DVT ultrasound to rule out DVT, obtain screening labs to include CBC, BMP, INR.  The patient's lower extremity  ultrasound was negative for DVT.  Greening labs significant for a therapeutic INR at 2.7, BMP unremarkable, CBC without a leukocytosis or anemia.  Overall stable for discharge at this time with plan for outpatient antibiotics and PCP follow-up.  Return precautions provided.  Final Clinical Impression(s) / ED Diagnoses Final diagnoses:  Cellulitis of left lower extremity    Rx / DC Orders ED Discharge Orders          Ordered    cefUROXime (CEFTIN) 500 MG tablet  2 times daily with meals        11/01/21 1656              Regan Lemming, MD 11/01/21 1658

## 2021-11-01 NOTE — ED Notes (Signed)
Pt alert still waiting on ptar alert

## 2021-11-01 NOTE — ED Notes (Signed)
ED Provider at bedside. 

## 2021-11-01 NOTE — Discharge Instructions (Addendum)
Your ultrasound was negative for DVT.  Your findings are concerning for cellulitis of the left lower extremity.  We will discharge you on antibiotics.  Return for worsening redness, swelling, purulent drainage despite antibiotic use.

## 2021-11-01 NOTE — ED Notes (Signed)
PT LEFT WITH [facility]

## 2021-11-03 DIAGNOSIS — E119 Type 2 diabetes mellitus without complications: Secondary | ICD-10-CM | POA: Diagnosis not present

## 2021-11-03 DIAGNOSIS — I82402 Acute embolism and thrombosis of unspecified deep veins of left lower extremity: Secondary | ICD-10-CM | POA: Diagnosis not present

## 2021-11-03 DIAGNOSIS — D649 Anemia, unspecified: Secondary | ICD-10-CM | POA: Diagnosis not present

## 2021-11-03 DIAGNOSIS — Z79899 Other long term (current) drug therapy: Secondary | ICD-10-CM | POA: Diagnosis not present

## 2021-12-04 DIAGNOSIS — E119 Type 2 diabetes mellitus without complications: Secondary | ICD-10-CM | POA: Diagnosis not present

## 2021-12-04 DIAGNOSIS — D689 Coagulation defect, unspecified: Secondary | ICD-10-CM | POA: Diagnosis not present

## 2021-12-21 DIAGNOSIS — F329 Major depressive disorder, single episode, unspecified: Secondary | ICD-10-CM | POA: Diagnosis not present

## 2021-12-21 DIAGNOSIS — Z7901 Long term (current) use of anticoagulants: Secondary | ICD-10-CM | POA: Diagnosis not present

## 2021-12-29 DIAGNOSIS — F259 Schizoaffective disorder, unspecified: Secondary | ICD-10-CM | POA: Diagnosis not present

## 2021-12-29 DIAGNOSIS — F32A Depression, unspecified: Secondary | ICD-10-CM | POA: Diagnosis not present

## 2021-12-29 DIAGNOSIS — G7001 Myasthenia gravis with (acute) exacerbation: Secondary | ICD-10-CM | POA: Diagnosis not present

## 2021-12-29 DIAGNOSIS — F03918 Unspecified dementia, unspecified severity, with other behavioral disturbance: Secondary | ICD-10-CM | POA: Diagnosis not present

## 2022-01-10 DIAGNOSIS — Z7901 Long term (current) use of anticoagulants: Secondary | ICD-10-CM | POA: Diagnosis not present

## 2022-02-02 DIAGNOSIS — G7001 Myasthenia gravis with (acute) exacerbation: Secondary | ICD-10-CM | POA: Diagnosis not present

## 2022-02-05 DIAGNOSIS — Z79899 Other long term (current) drug therapy: Secondary | ICD-10-CM | POA: Diagnosis not present

## 2022-02-05 DIAGNOSIS — D649 Anemia, unspecified: Secondary | ICD-10-CM | POA: Diagnosis not present

## 2022-02-05 DIAGNOSIS — D6949 Other primary thrombocytopenia: Secondary | ICD-10-CM | POA: Diagnosis not present

## 2022-02-05 DIAGNOSIS — I82402 Acute embolism and thrombosis of unspecified deep veins of left lower extremity: Secondary | ICD-10-CM | POA: Diagnosis not present

## 2022-02-05 DIAGNOSIS — E119 Type 2 diabetes mellitus without complications: Secondary | ICD-10-CM | POA: Diagnosis not present

## 2022-02-06 DIAGNOSIS — G7001 Myasthenia gravis with (acute) exacerbation: Secondary | ICD-10-CM | POA: Diagnosis not present

## 2022-02-13 DIAGNOSIS — Z86718 Personal history of other venous thrombosis and embolism: Secondary | ICD-10-CM | POA: Diagnosis not present

## 2022-02-13 DIAGNOSIS — Z7901 Long term (current) use of anticoagulants: Secondary | ICD-10-CM | POA: Diagnosis not present

## 2022-03-05 DIAGNOSIS — D6949 Other primary thrombocytopenia: Secondary | ICD-10-CM | POA: Diagnosis not present

## 2022-03-05 DIAGNOSIS — Z79899 Other long term (current) drug therapy: Secondary | ICD-10-CM | POA: Diagnosis not present

## 2022-03-05 DIAGNOSIS — G7000 Myasthenia gravis without (acute) exacerbation: Secondary | ICD-10-CM | POA: Diagnosis not present

## 2022-03-08 DIAGNOSIS — D649 Anemia, unspecified: Secondary | ICD-10-CM | POA: Diagnosis not present

## 2022-03-12 DIAGNOSIS — Z79899 Other long term (current) drug therapy: Secondary | ICD-10-CM | POA: Diagnosis not present

## 2022-03-12 DIAGNOSIS — D649 Anemia, unspecified: Secondary | ICD-10-CM | POA: Diagnosis not present

## 2022-03-12 DIAGNOSIS — D689 Coagulation defect, unspecified: Secondary | ICD-10-CM | POA: Diagnosis not present

## 2022-03-13 DIAGNOSIS — Z7901 Long term (current) use of anticoagulants: Secondary | ICD-10-CM | POA: Diagnosis not present

## 2022-03-13 DIAGNOSIS — Z86718 Personal history of other venous thrombosis and embolism: Secondary | ICD-10-CM | POA: Diagnosis not present

## 2022-03-28 DIAGNOSIS — F330 Major depressive disorder, recurrent, mild: Secondary | ICD-10-CM | POA: Diagnosis not present

## 2022-03-28 DIAGNOSIS — G7001 Myasthenia gravis with (acute) exacerbation: Secondary | ICD-10-CM | POA: Diagnosis not present

## 2022-03-28 DIAGNOSIS — F03918 Unspecified dementia, unspecified severity, with other behavioral disturbance: Secondary | ICD-10-CM | POA: Diagnosis not present

## 2022-03-28 DIAGNOSIS — F259 Schizoaffective disorder, unspecified: Secondary | ICD-10-CM | POA: Diagnosis not present

## 2022-04-04 DIAGNOSIS — G7001 Myasthenia gravis with (acute) exacerbation: Secondary | ICD-10-CM | POA: Diagnosis not present

## 2022-04-04 DIAGNOSIS — D6949 Other primary thrombocytopenia: Secondary | ICD-10-CM | POA: Diagnosis not present

## 2022-04-04 DIAGNOSIS — Z79899 Other long term (current) drug therapy: Secondary | ICD-10-CM | POA: Diagnosis not present

## 2022-04-10 DIAGNOSIS — I739 Peripheral vascular disease, unspecified: Secondary | ICD-10-CM | POA: Diagnosis not present

## 2022-04-18 DIAGNOSIS — F03918 Unspecified dementia, unspecified severity, with other behavioral disturbance: Secondary | ICD-10-CM | POA: Diagnosis not present

## 2022-04-18 DIAGNOSIS — F32A Depression, unspecified: Secondary | ICD-10-CM | POA: Diagnosis not present

## 2022-04-18 DIAGNOSIS — F419 Anxiety disorder, unspecified: Secondary | ICD-10-CM | POA: Diagnosis not present

## 2022-04-18 DIAGNOSIS — Z23 Encounter for immunization: Secondary | ICD-10-CM | POA: Diagnosis not present

## 2022-04-18 DIAGNOSIS — F259 Schizoaffective disorder, unspecified: Secondary | ICD-10-CM | POA: Diagnosis not present

## 2022-04-30 DIAGNOSIS — G7001 Myasthenia gravis with (acute) exacerbation: Secondary | ICD-10-CM | POA: Diagnosis not present

## 2022-05-05 DIAGNOSIS — Z79899 Other long term (current) drug therapy: Secondary | ICD-10-CM | POA: Diagnosis not present

## 2022-05-05 DIAGNOSIS — D6949 Other primary thrombocytopenia: Secondary | ICD-10-CM | POA: Diagnosis not present

## 2022-05-08 DIAGNOSIS — G7001 Myasthenia gravis with (acute) exacerbation: Secondary | ICD-10-CM | POA: Diagnosis not present

## 2022-05-17 DIAGNOSIS — F330 Major depressive disorder, recurrent, mild: Secondary | ICD-10-CM | POA: Diagnosis not present

## 2022-05-17 DIAGNOSIS — F259 Schizoaffective disorder, unspecified: Secondary | ICD-10-CM | POA: Diagnosis not present

## 2022-05-17 DIAGNOSIS — G7001 Myasthenia gravis with (acute) exacerbation: Secondary | ICD-10-CM | POA: Diagnosis not present

## 2022-05-17 DIAGNOSIS — F03918 Unspecified dementia, unspecified severity, with other behavioral disturbance: Secondary | ICD-10-CM | POA: Diagnosis not present

## 2022-06-08 DIAGNOSIS — D689 Coagulation defect, unspecified: Secondary | ICD-10-CM | POA: Diagnosis not present

## 2022-06-08 DIAGNOSIS — E119 Type 2 diabetes mellitus without complications: Secondary | ICD-10-CM | POA: Diagnosis not present

## 2022-06-22 DIAGNOSIS — G7001 Myasthenia gravis with (acute) exacerbation: Secondary | ICD-10-CM | POA: Diagnosis not present

## 2022-06-22 DIAGNOSIS — F330 Major depressive disorder, recurrent, mild: Secondary | ICD-10-CM | POA: Diagnosis not present

## 2022-06-22 DIAGNOSIS — F03918 Unspecified dementia, unspecified severity, with other behavioral disturbance: Secondary | ICD-10-CM | POA: Diagnosis not present

## 2022-06-22 DIAGNOSIS — F259 Schizoaffective disorder, unspecified: Secondary | ICD-10-CM | POA: Diagnosis not present

## 2022-06-28 DIAGNOSIS — Z961 Presence of intraocular lens: Secondary | ICD-10-CM | POA: Diagnosis not present

## 2022-06-28 DIAGNOSIS — H26492 Other secondary cataract, left eye: Secondary | ICD-10-CM | POA: Diagnosis not present

## 2022-06-29 DIAGNOSIS — I739 Peripheral vascular disease, unspecified: Secondary | ICD-10-CM | POA: Diagnosis not present

## 2022-06-29 DIAGNOSIS — L603 Nail dystrophy: Secondary | ICD-10-CM | POA: Diagnosis not present

## 2022-06-29 DIAGNOSIS — M2042 Other hammer toe(s) (acquired), left foot: Secondary | ICD-10-CM | POA: Diagnosis not present

## 2022-06-29 DIAGNOSIS — M2041 Other hammer toe(s) (acquired), right foot: Secondary | ICD-10-CM | POA: Diagnosis not present

## 2022-07-06 DIAGNOSIS — D6949 Other primary thrombocytopenia: Secondary | ICD-10-CM | POA: Diagnosis not present

## 2022-07-06 DIAGNOSIS — Z79899 Other long term (current) drug therapy: Secondary | ICD-10-CM | POA: Diagnosis not present

## 2022-07-12 DIAGNOSIS — F259 Schizoaffective disorder, unspecified: Secondary | ICD-10-CM | POA: Diagnosis not present

## 2022-07-12 DIAGNOSIS — G7001 Myasthenia gravis with (acute) exacerbation: Secondary | ICD-10-CM | POA: Diagnosis not present

## 2022-07-12 DIAGNOSIS — F03918 Unspecified dementia, unspecified severity, with other behavioral disturbance: Secondary | ICD-10-CM | POA: Diagnosis not present

## 2022-07-12 DIAGNOSIS — F330 Major depressive disorder, recurrent, mild: Secondary | ICD-10-CM | POA: Diagnosis not present

## 2022-07-16 DIAGNOSIS — E119 Type 2 diabetes mellitus without complications: Secondary | ICD-10-CM | POA: Diagnosis not present

## 2022-07-16 DIAGNOSIS — D689 Coagulation defect, unspecified: Secondary | ICD-10-CM | POA: Diagnosis not present

## 2022-07-16 DIAGNOSIS — I82403 Acute embolism and thrombosis of unspecified deep veins of lower extremity, bilateral: Secondary | ICD-10-CM | POA: Diagnosis not present

## 2022-07-16 DIAGNOSIS — I82402 Acute embolism and thrombosis of unspecified deep veins of left lower extremity: Secondary | ICD-10-CM | POA: Diagnosis not present

## 2022-08-01 DIAGNOSIS — F03918 Unspecified dementia, unspecified severity, with other behavioral disturbance: Secondary | ICD-10-CM | POA: Diagnosis not present

## 2022-08-01 DIAGNOSIS — F419 Anxiety disorder, unspecified: Secondary | ICD-10-CM | POA: Diagnosis not present

## 2022-08-01 DIAGNOSIS — I10 Essential (primary) hypertension: Secondary | ICD-10-CM | POA: Diagnosis not present

## 2022-08-01 DIAGNOSIS — E119 Type 2 diabetes mellitus without complications: Secondary | ICD-10-CM | POA: Diagnosis not present

## 2022-08-06 DIAGNOSIS — Z79899 Other long term (current) drug therapy: Secondary | ICD-10-CM | POA: Diagnosis not present

## 2022-08-06 DIAGNOSIS — D6949 Other primary thrombocytopenia: Secondary | ICD-10-CM | POA: Diagnosis not present

## 2022-08-07 DIAGNOSIS — G7000 Myasthenia gravis without (acute) exacerbation: Secondary | ICD-10-CM | POA: Diagnosis not present

## 2022-08-10 DIAGNOSIS — F03918 Unspecified dementia, unspecified severity, with other behavioral disturbance: Secondary | ICD-10-CM | POA: Diagnosis not present

## 2022-08-10 DIAGNOSIS — G7001 Myasthenia gravis with (acute) exacerbation: Secondary | ICD-10-CM | POA: Diagnosis not present

## 2022-08-10 DIAGNOSIS — F259 Schizoaffective disorder, unspecified: Secondary | ICD-10-CM | POA: Diagnosis not present

## 2022-08-10 DIAGNOSIS — F330 Major depressive disorder, recurrent, mild: Secondary | ICD-10-CM | POA: Diagnosis not present

## 2022-09-03 DIAGNOSIS — D6949 Other primary thrombocytopenia: Secondary | ICD-10-CM | POA: Diagnosis not present

## 2022-09-03 DIAGNOSIS — Z79899 Other long term (current) drug therapy: Secondary | ICD-10-CM | POA: Diagnosis not present

## 2022-09-12 DIAGNOSIS — F419 Anxiety disorder, unspecified: Secondary | ICD-10-CM | POA: Diagnosis not present

## 2022-09-12 DIAGNOSIS — F259 Schizoaffective disorder, unspecified: Secondary | ICD-10-CM | POA: Diagnosis not present

## 2022-09-12 DIAGNOSIS — F03918 Unspecified dementia, unspecified severity, with other behavioral disturbance: Secondary | ICD-10-CM | POA: Diagnosis not present

## 2022-09-12 DIAGNOSIS — F32A Depression, unspecified: Secondary | ICD-10-CM | POA: Diagnosis not present

## 2022-09-17 DIAGNOSIS — R1912 Hyperactive bowel sounds: Secondary | ICD-10-CM | POA: Diagnosis not present

## 2022-10-05 DIAGNOSIS — Z79899 Other long term (current) drug therapy: Secondary | ICD-10-CM | POA: Diagnosis not present

## 2022-10-05 DIAGNOSIS — D6949 Other primary thrombocytopenia: Secondary | ICD-10-CM | POA: Diagnosis not present

## 2022-10-08 DIAGNOSIS — Z5181 Encounter for therapeutic drug level monitoring: Secondary | ICD-10-CM | POA: Diagnosis not present

## 2022-10-08 DIAGNOSIS — Z86718 Personal history of other venous thrombosis and embolism: Secondary | ICD-10-CM | POA: Diagnosis not present

## 2022-10-08 DIAGNOSIS — Z7901 Long term (current) use of anticoagulants: Secondary | ICD-10-CM | POA: Diagnosis not present

## 2022-10-11 DIAGNOSIS — F330 Major depressive disorder, recurrent, mild: Secondary | ICD-10-CM | POA: Diagnosis not present

## 2022-10-11 DIAGNOSIS — Z86718 Personal history of other venous thrombosis and embolism: Secondary | ICD-10-CM | POA: Diagnosis not present

## 2022-10-11 DIAGNOSIS — F259 Schizoaffective disorder, unspecified: Secondary | ICD-10-CM | POA: Diagnosis not present

## 2022-10-11 DIAGNOSIS — Z23 Encounter for immunization: Secondary | ICD-10-CM | POA: Diagnosis not present

## 2022-10-11 DIAGNOSIS — G7001 Myasthenia gravis with (acute) exacerbation: Secondary | ICD-10-CM | POA: Diagnosis not present

## 2022-11-05 DIAGNOSIS — Z79899 Other long term (current) drug therapy: Secondary | ICD-10-CM | POA: Diagnosis not present

## 2022-11-05 DIAGNOSIS — D6949 Other primary thrombocytopenia: Secondary | ICD-10-CM | POA: Diagnosis not present

## 2022-11-14 DIAGNOSIS — K219 Gastro-esophageal reflux disease without esophagitis: Secondary | ICD-10-CM | POA: Diagnosis not present

## 2022-11-14 DIAGNOSIS — F29 Unspecified psychosis not due to a substance or known physiological condition: Secondary | ICD-10-CM | POA: Diagnosis not present

## 2022-11-14 DIAGNOSIS — F259 Schizoaffective disorder, unspecified: Secondary | ICD-10-CM | POA: Diagnosis not present

## 2022-11-14 DIAGNOSIS — Z86718 Personal history of other venous thrombosis and embolism: Secondary | ICD-10-CM | POA: Diagnosis not present

## 2022-12-05 DIAGNOSIS — D689 Coagulation defect, unspecified: Secondary | ICD-10-CM | POA: Diagnosis not present

## 2022-12-05 DIAGNOSIS — E119 Type 2 diabetes mellitus without complications: Secondary | ICD-10-CM | POA: Diagnosis not present

## 2022-12-06 DIAGNOSIS — G7000 Myasthenia gravis without (acute) exacerbation: Secondary | ICD-10-CM | POA: Diagnosis not present

## 2022-12-14 DIAGNOSIS — F330 Major depressive disorder, recurrent, mild: Secondary | ICD-10-CM | POA: Diagnosis not present

## 2022-12-14 DIAGNOSIS — Z86718 Personal history of other venous thrombosis and embolism: Secondary | ICD-10-CM | POA: Diagnosis not present

## 2022-12-14 DIAGNOSIS — F259 Schizoaffective disorder, unspecified: Secondary | ICD-10-CM | POA: Diagnosis not present

## 2022-12-14 DIAGNOSIS — G7001 Myasthenia gravis with (acute) exacerbation: Secondary | ICD-10-CM | POA: Diagnosis not present

## 2022-12-21 DIAGNOSIS — E785 Hyperlipidemia, unspecified: Secondary | ICD-10-CM | POA: Diagnosis not present

## 2022-12-27 DIAGNOSIS — E785 Hyperlipidemia, unspecified: Secondary | ICD-10-CM | POA: Diagnosis not present

## 2023-01-04 DIAGNOSIS — D6949 Other primary thrombocytopenia: Secondary | ICD-10-CM | POA: Diagnosis not present

## 2023-01-04 DIAGNOSIS — Z79899 Other long term (current) drug therapy: Secondary | ICD-10-CM | POA: Diagnosis not present

## 2023-01-07 DIAGNOSIS — G7000 Myasthenia gravis without (acute) exacerbation: Secondary | ICD-10-CM | POA: Diagnosis not present

## 2023-01-08 DIAGNOSIS — F259 Schizoaffective disorder, unspecified: Secondary | ICD-10-CM | POA: Diagnosis not present

## 2023-01-09 DIAGNOSIS — N390 Urinary tract infection, site not specified: Secondary | ICD-10-CM | POA: Diagnosis not present

## 2023-01-11 DIAGNOSIS — N390 Urinary tract infection, site not specified: Secondary | ICD-10-CM | POA: Diagnosis not present

## 2023-01-16 DIAGNOSIS — G4700 Insomnia, unspecified: Secondary | ICD-10-CM | POA: Diagnosis not present

## 2023-01-16 DIAGNOSIS — F03918 Unspecified dementia, unspecified severity, with other behavioral disturbance: Secondary | ICD-10-CM | POA: Diagnosis not present

## 2023-01-16 DIAGNOSIS — F259 Schizoaffective disorder, unspecified: Secondary | ICD-10-CM | POA: Diagnosis not present

## 2023-01-16 DIAGNOSIS — F419 Anxiety disorder, unspecified: Secondary | ICD-10-CM | POA: Diagnosis not present

## 2023-01-23 DIAGNOSIS — F250 Schizoaffective disorder, bipolar type: Secondary | ICD-10-CM | POA: Diagnosis not present

## 2023-02-18 DIAGNOSIS — F259 Schizoaffective disorder, unspecified: Secondary | ICD-10-CM | POA: Diagnosis not present

## 2023-02-18 DIAGNOSIS — F03918 Unspecified dementia, unspecified severity, with other behavioral disturbance: Secondary | ICD-10-CM | POA: Diagnosis not present

## 2023-02-18 DIAGNOSIS — G7000 Myasthenia gravis without (acute) exacerbation: Secondary | ICD-10-CM | POA: Diagnosis not present

## 2023-02-18 DIAGNOSIS — Z8674 Personal history of sudden cardiac arrest: Secondary | ICD-10-CM | POA: Diagnosis not present

## 2023-02-19 DIAGNOSIS — F03918 Unspecified dementia, unspecified severity, with other behavioral disturbance: Secondary | ICD-10-CM | POA: Diagnosis not present

## 2023-02-19 DIAGNOSIS — F259 Schizoaffective disorder, unspecified: Secondary | ICD-10-CM | POA: Diagnosis not present

## 2023-02-20 DIAGNOSIS — F250 Schizoaffective disorder, bipolar type: Secondary | ICD-10-CM | POA: Diagnosis not present

## 2023-03-19 DIAGNOSIS — Z23 Encounter for immunization: Secondary | ICD-10-CM | POA: Diagnosis not present

## 2023-03-28 DIAGNOSIS — F259 Schizoaffective disorder, unspecified: Secondary | ICD-10-CM | POA: Diagnosis not present

## 2023-03-28 DIAGNOSIS — F03918 Unspecified dementia, unspecified severity, with other behavioral disturbance: Secondary | ICD-10-CM | POA: Diagnosis not present

## 2023-04-03 DIAGNOSIS — F03918 Unspecified dementia, unspecified severity, with other behavioral disturbance: Secondary | ICD-10-CM | POA: Diagnosis not present

## 2023-04-03 DIAGNOSIS — Z86718 Personal history of other venous thrombosis and embolism: Secondary | ICD-10-CM | POA: Diagnosis not present

## 2023-04-03 DIAGNOSIS — F32A Depression, unspecified: Secondary | ICD-10-CM | POA: Diagnosis not present

## 2023-04-03 DIAGNOSIS — F259 Schizoaffective disorder, unspecified: Secondary | ICD-10-CM | POA: Diagnosis not present

## 2023-04-11 DIAGNOSIS — Z23 Encounter for immunization: Secondary | ICD-10-CM | POA: Diagnosis not present

## 2023-04-30 DIAGNOSIS — F259 Schizoaffective disorder, unspecified: Secondary | ICD-10-CM | POA: Diagnosis not present

## 2023-04-30 DIAGNOSIS — G7000 Myasthenia gravis without (acute) exacerbation: Secondary | ICD-10-CM | POA: Diagnosis not present

## 2023-04-30 DIAGNOSIS — F03918 Unspecified dementia, unspecified severity, with other behavioral disturbance: Secondary | ICD-10-CM | POA: Diagnosis not present

## 2023-06-07 DIAGNOSIS — E119 Type 2 diabetes mellitus without complications: Secondary | ICD-10-CM | POA: Diagnosis not present

## 2023-06-07 DIAGNOSIS — D689 Coagulation defect, unspecified: Secondary | ICD-10-CM | POA: Diagnosis not present

## 2023-06-11 DIAGNOSIS — G7000 Myasthenia gravis without (acute) exacerbation: Secondary | ICD-10-CM | POA: Diagnosis not present

## 2023-07-09 DIAGNOSIS — Z961 Presence of intraocular lens: Secondary | ICD-10-CM | POA: Diagnosis not present

## 2023-07-09 DIAGNOSIS — H26492 Other secondary cataract, left eye: Secondary | ICD-10-CM | POA: Diagnosis not present

## 2023-07-25 DIAGNOSIS — F250 Schizoaffective disorder, bipolar type: Secondary | ICD-10-CM | POA: Diagnosis not present

## 2023-07-25 DIAGNOSIS — G7000 Myasthenia gravis without (acute) exacerbation: Secondary | ICD-10-CM | POA: Diagnosis not present

## 2023-07-25 DIAGNOSIS — F259 Schizoaffective disorder, unspecified: Secondary | ICD-10-CM | POA: Diagnosis not present

## 2023-07-25 DIAGNOSIS — F03918 Unspecified dementia, unspecified severity, with other behavioral disturbance: Secondary | ICD-10-CM | POA: Diagnosis not present

## 2023-07-25 DIAGNOSIS — K219 Gastro-esophageal reflux disease without esophagitis: Secondary | ICD-10-CM | POA: Diagnosis not present

## 2023-08-15 DIAGNOSIS — F250 Schizoaffective disorder, bipolar type: Secondary | ICD-10-CM | POA: Diagnosis not present

## 2023-08-21 DIAGNOSIS — F03918 Unspecified dementia, unspecified severity, with other behavioral disturbance: Secondary | ICD-10-CM | POA: Diagnosis not present

## 2023-08-21 DIAGNOSIS — G4700 Insomnia, unspecified: Secondary | ICD-10-CM | POA: Diagnosis not present

## 2023-08-21 DIAGNOSIS — F259 Schizoaffective disorder, unspecified: Secondary | ICD-10-CM | POA: Diagnosis not present

## 2023-08-22 DIAGNOSIS — F250 Schizoaffective disorder, bipolar type: Secondary | ICD-10-CM | POA: Diagnosis not present

## 2023-09-19 DIAGNOSIS — F250 Schizoaffective disorder, bipolar type: Secondary | ICD-10-CM | POA: Diagnosis not present

## 2023-09-26 DIAGNOSIS — G7000 Myasthenia gravis without (acute) exacerbation: Secondary | ICD-10-CM | POA: Diagnosis not present

## 2023-09-26 DIAGNOSIS — F03918 Unspecified dementia, unspecified severity, with other behavioral disturbance: Secondary | ICD-10-CM | POA: Diagnosis not present

## 2023-09-26 DIAGNOSIS — F259 Schizoaffective disorder, unspecified: Secondary | ICD-10-CM | POA: Diagnosis not present

## 2023-09-26 DIAGNOSIS — Z86718 Personal history of other venous thrombosis and embolism: Secondary | ICD-10-CM | POA: Diagnosis not present

## 2023-10-15 DIAGNOSIS — F250 Schizoaffective disorder, bipolar type: Secondary | ICD-10-CM | POA: Diagnosis not present

## 2023-10-29 DIAGNOSIS — F250 Schizoaffective disorder, bipolar type: Secondary | ICD-10-CM | POA: Diagnosis not present

## 2023-10-30 DIAGNOSIS — Z7901 Long term (current) use of anticoagulants: Secondary | ICD-10-CM | POA: Diagnosis not present

## 2023-10-30 DIAGNOSIS — F259 Schizoaffective disorder, unspecified: Secondary | ICD-10-CM | POA: Diagnosis not present

## 2023-10-30 DIAGNOSIS — F32A Depression, unspecified: Secondary | ICD-10-CM | POA: Diagnosis not present

## 2023-10-30 DIAGNOSIS — I2699 Other pulmonary embolism without acute cor pulmonale: Secondary | ICD-10-CM | POA: Diagnosis not present

## 2023-11-12 DIAGNOSIS — F250 Schizoaffective disorder, bipolar type: Secondary | ICD-10-CM | POA: Diagnosis not present

## 2023-11-26 DIAGNOSIS — F250 Schizoaffective disorder, bipolar type: Secondary | ICD-10-CM | POA: Diagnosis not present

## 2023-11-29 DIAGNOSIS — G7000 Myasthenia gravis without (acute) exacerbation: Secondary | ICD-10-CM | POA: Diagnosis not present

## 2023-11-29 DIAGNOSIS — F03B18 Unspecified dementia, moderate, with other behavioral disturbance: Secondary | ICD-10-CM | POA: Diagnosis not present

## 2023-11-29 DIAGNOSIS — K219 Gastro-esophageal reflux disease without esophagitis: Secondary | ICD-10-CM | POA: Diagnosis not present

## 2023-11-29 DIAGNOSIS — F259 Schizoaffective disorder, unspecified: Secondary | ICD-10-CM | POA: Diagnosis not present

## 2023-12-10 DIAGNOSIS — F250 Schizoaffective disorder, bipolar type: Secondary | ICD-10-CM | POA: Diagnosis not present

## 2023-12-24 DIAGNOSIS — R2689 Other abnormalities of gait and mobility: Secondary | ICD-10-CM | POA: Diagnosis not present

## 2023-12-24 DIAGNOSIS — F250 Schizoaffective disorder, bipolar type: Secondary | ICD-10-CM | POA: Diagnosis not present

## 2023-12-24 DIAGNOSIS — M6281 Muscle weakness (generalized): Secondary | ICD-10-CM | POA: Diagnosis not present

## 2023-12-24 DIAGNOSIS — J9601 Acute respiratory failure with hypoxia: Secondary | ICD-10-CM | POA: Diagnosis not present

## 2024-01-07 DIAGNOSIS — F250 Schizoaffective disorder, bipolar type: Secondary | ICD-10-CM | POA: Diagnosis not present

## 2024-01-08 DIAGNOSIS — I10 Essential (primary) hypertension: Secondary | ICD-10-CM | POA: Diagnosis not present

## 2024-01-08 DIAGNOSIS — F29 Unspecified psychosis not due to a substance or known physiological condition: Secondary | ICD-10-CM | POA: Diagnosis not present

## 2024-01-08 DIAGNOSIS — I2699 Other pulmonary embolism without acute cor pulmonale: Secondary | ICD-10-CM | POA: Diagnosis not present

## 2024-01-08 DIAGNOSIS — K219 Gastro-esophageal reflux disease without esophagitis: Secondary | ICD-10-CM | POA: Diagnosis not present

## 2024-01-21 DIAGNOSIS — F250 Schizoaffective disorder, bipolar type: Secondary | ICD-10-CM | POA: Diagnosis not present

## 2024-01-27 DIAGNOSIS — F03A18 Unspecified dementia, mild, with other behavioral disturbance: Secondary | ICD-10-CM | POA: Diagnosis not present

## 2024-01-27 DIAGNOSIS — Z86718 Personal history of other venous thrombosis and embolism: Secondary | ICD-10-CM | POA: Diagnosis not present

## 2024-01-27 DIAGNOSIS — G7000 Myasthenia gravis without (acute) exacerbation: Secondary | ICD-10-CM | POA: Diagnosis not present

## 2024-01-27 DIAGNOSIS — F259 Schizoaffective disorder, unspecified: Secondary | ICD-10-CM | POA: Diagnosis not present

## 2024-01-31 DIAGNOSIS — I82402 Acute embolism and thrombosis of unspecified deep veins of left lower extremity: Secondary | ICD-10-CM | POA: Diagnosis not present

## 2024-01-31 DIAGNOSIS — L602 Onychogryphosis: Secondary | ICD-10-CM | POA: Diagnosis not present

## 2024-01-31 DIAGNOSIS — L84 Corns and callosities: Secondary | ICD-10-CM | POA: Diagnosis not present

## 2024-01-31 DIAGNOSIS — L603 Nail dystrophy: Secondary | ICD-10-CM | POA: Diagnosis not present

## 2024-01-31 DIAGNOSIS — D649 Anemia, unspecified: Secondary | ICD-10-CM | POA: Diagnosis not present

## 2024-01-31 DIAGNOSIS — Z79899 Other long term (current) drug therapy: Secondary | ICD-10-CM | POA: Diagnosis not present

## 2024-01-31 DIAGNOSIS — I739 Peripheral vascular disease, unspecified: Secondary | ICD-10-CM | POA: Diagnosis not present

## 2024-02-03 DIAGNOSIS — G7000 Myasthenia gravis without (acute) exacerbation: Secondary | ICD-10-CM | POA: Diagnosis not present

## 2024-02-04 DIAGNOSIS — F250 Schizoaffective disorder, bipolar type: Secondary | ICD-10-CM | POA: Diagnosis not present

## 2024-02-18 DIAGNOSIS — F250 Schizoaffective disorder, bipolar type: Secondary | ICD-10-CM | POA: Diagnosis not present

## 2024-02-26 DIAGNOSIS — F0390 Unspecified dementia without behavioral disturbance: Secondary | ICD-10-CM | POA: Diagnosis not present

## 2024-02-26 DIAGNOSIS — F259 Schizoaffective disorder, unspecified: Secondary | ICD-10-CM | POA: Diagnosis not present

## 2024-02-26 DIAGNOSIS — F32A Depression, unspecified: Secondary | ICD-10-CM | POA: Diagnosis not present

## 2024-02-26 DIAGNOSIS — K219 Gastro-esophageal reflux disease without esophagitis: Secondary | ICD-10-CM | POA: Diagnosis not present

## 2024-03-02 DIAGNOSIS — I82403 Acute embolism and thrombosis of unspecified deep veins of lower extremity, bilateral: Secondary | ICD-10-CM | POA: Diagnosis not present

## 2024-03-03 DIAGNOSIS — G7000 Myasthenia gravis without (acute) exacerbation: Secondary | ICD-10-CM | POA: Diagnosis not present

## 2024-03-03 DIAGNOSIS — F250 Schizoaffective disorder, bipolar type: Secondary | ICD-10-CM | POA: Diagnosis not present

## 2024-03-06 DIAGNOSIS — F259 Schizoaffective disorder, unspecified: Secondary | ICD-10-CM | POA: Diagnosis not present

## 2024-03-24 DIAGNOSIS — F250 Schizoaffective disorder, bipolar type: Secondary | ICD-10-CM | POA: Diagnosis not present

## 2024-03-31 DIAGNOSIS — F3342 Major depressive disorder, recurrent, in full remission: Secondary | ICD-10-CM | POA: Diagnosis not present

## 2024-03-31 DIAGNOSIS — Z86718 Personal history of other venous thrombosis and embolism: Secondary | ICD-10-CM | POA: Diagnosis not present

## 2024-03-31 DIAGNOSIS — F259 Schizoaffective disorder, unspecified: Secondary | ICD-10-CM | POA: Diagnosis not present

## 2024-03-31 DIAGNOSIS — G7000 Myasthenia gravis without (acute) exacerbation: Secondary | ICD-10-CM | POA: Diagnosis not present

## 2024-04-02 DIAGNOSIS — F3112 Bipolar disorder, current episode manic without psychotic features, moderate: Secondary | ICD-10-CM | POA: Diagnosis not present

## 2024-04-03 DIAGNOSIS — L602 Onychogryphosis: Secondary | ICD-10-CM | POA: Diagnosis not present

## 2024-04-03 DIAGNOSIS — I739 Peripheral vascular disease, unspecified: Secondary | ICD-10-CM | POA: Diagnosis not present

## 2024-04-07 DIAGNOSIS — F250 Schizoaffective disorder, bipolar type: Secondary | ICD-10-CM | POA: Diagnosis not present

## 2024-04-10 DIAGNOSIS — Z23 Encounter for immunization: Secondary | ICD-10-CM | POA: Diagnosis not present

## 2024-04-14 DIAGNOSIS — S59811A Other specified injuries right forearm, initial encounter: Secondary | ICD-10-CM | POA: Diagnosis not present

## 2024-04-21 DIAGNOSIS — F250 Schizoaffective disorder, bipolar type: Secondary | ICD-10-CM | POA: Diagnosis not present

## 2024-05-05 DIAGNOSIS — F250 Schizoaffective disorder, bipolar type: Secondary | ICD-10-CM | POA: Diagnosis not present

## 2024-05-19 DIAGNOSIS — F250 Schizoaffective disorder, bipolar type: Secondary | ICD-10-CM | POA: Diagnosis not present

## 2024-07-09 ENCOUNTER — Ambulatory Visit: Admitting: Dermatology

## 2024-07-09 ENCOUNTER — Encounter: Payer: Self-pay | Admitting: Dermatology

## 2024-07-09 DIAGNOSIS — D485 Neoplasm of uncertain behavior of skin: Secondary | ICD-10-CM

## 2024-07-09 DIAGNOSIS — W908XXA Exposure to other nonionizing radiation, initial encounter: Secondary | ICD-10-CM | POA: Diagnosis not present

## 2024-07-09 DIAGNOSIS — C44622 Squamous cell carcinoma of skin of right upper limb, including shoulder: Secondary | ICD-10-CM

## 2024-07-09 DIAGNOSIS — L578 Other skin changes due to chronic exposure to nonionizing radiation: Secondary | ICD-10-CM

## 2024-07-09 DIAGNOSIS — C4492 Squamous cell carcinoma of skin, unspecified: Secondary | ICD-10-CM

## 2024-07-09 HISTORY — DX: Squamous cell carcinoma of skin, unspecified: C44.92

## 2024-07-09 NOTE — Patient Instructions (Addendum)

## 2024-07-09 NOTE — Progress Notes (Signed)
" ° °  New Patient Visit   History of Present Illness Victor Durham. is a 69 year old male who presents with a spot on his arm.  He has noticed a spot on his arm for the past couple of months. The spot is not growing quickly, and there is no history of injury to the area. He is currently on blood thinners.  He denies any pain associated with the spot, although it itches when he wears long sleeves. There is no history of skin cancer, and he has never had a skin biopsy before.  During the review of symptoms, he confirms that the spot does not hurt when touched and denies any sharp pain or discomfort.  Pt has growth on his right forearm that needs to be evaluated that has been present a couple months. He has no hx of skin cancer.  The following portions of the chart were reviewed this encounter and updated as appropriate: medications, allergies, medical history  Review of Systems:  No other skin or systemic complaints except as noted in HPI or Assessment and Plan.  Objective  Well appearing patient in no apparent distress; mood and affect are within normal limits.  A focused examination was performed of the following areas: Right forearm  Relevant exam findings are noted in the Assessment and Plan.  Right Forearm 2.4 x 1.5 cm dome shaped pink nodule with central crusting   Assessment & Plan    ACTINIC DAMAGE - chronic, secondary to cumulative UV radiation exposure/sun exposure over time - diffuse scaly erythematous macules with underlying dyspigmentation - Recommend daily broad spectrum sunscreen SPF 30+ to sun-exposed areas, reapply every 2 hours as needed.  - Recommend staying in the shade or wearing long sleeves, sun glasses (UVA+UVB protection) and wide brim hats (4-inch brim around the entire circumference of the hat). - Call for new or changing lesions.  NEOPLASM OF UNCERTAIN BEHAVIOR OF SKIN Right Forearm - Skin / nail biopsy Type of biopsy: tangential   Informed  consent: discussed and consent obtained   Timeout: patient name, date of birth, surgical site, and procedure verified   Procedure prep:  Patient was prepped and draped in usual sterile fashion Prep type:  Isopropyl alcohol Anesthesia: the lesion was anesthetized in a standard fashion   Anesthetic:  1% lidocaine  w/ epinephrine 1-100,000 buffered w/ 8.4% NaHCO3 Instrument used: DermaBlade   Hemostasis achieved with: aluminum  chloride and electrodesiccation   Outcome: patient tolerated procedure well   Post-procedure details: sterile dressing applied and wound care instructions given   Dressing type: bandage and pressure dressing    Specimen 1 - Surgical pathology Differential Diagnosis: R/O NMSC  Check Margins: No  Return for TBSE with Victor Durham.  I, Darice Smock, CMA, am acting as scribe for RUFUS CHRISTELLA HOLY, MD.   Documentation: I have reviewed the above documentation for accuracy and completeness, and I agree with the above.  RUFUS CHRISTELLA HOLY, MD    "

## 2024-07-10 ENCOUNTER — Ambulatory Visit: Payer: Self-pay | Admitting: Dermatology

## 2024-07-10 LAB — SURGICAL PATHOLOGY

## 2024-08-24 ENCOUNTER — Encounter: Admitting: Dermatology

## 2024-10-13 ENCOUNTER — Ambulatory Visit: Admitting: Physician Assistant
